# Patient Record
Sex: Female | Born: 1935 | Race: Black or African American | Hispanic: No | Smoking: Never smoker
Health system: Southern US, Community
[De-identification: ages and names within clinical notes are randomized; demographics above are authoritative.]

## PROBLEM LIST (undated history)

## (undated) DIAGNOSIS — I10 Essential (primary) hypertension: Secondary | ICD-10-CM

## (undated) DIAGNOSIS — C801 Malignant (primary) neoplasm, unspecified: Secondary | ICD-10-CM

## (undated) DIAGNOSIS — Q600 Renal agenesis, unilateral: Secondary | ICD-10-CM

## (undated) DIAGNOSIS — I639 Cerebral infarction, unspecified: Secondary | ICD-10-CM

## (undated) DIAGNOSIS — K219 Gastro-esophageal reflux disease without esophagitis: Secondary | ICD-10-CM

## (undated) DIAGNOSIS — K222 Esophageal obstruction: Secondary | ICD-10-CM

## (undated) DIAGNOSIS — R569 Unspecified convulsions: Secondary | ICD-10-CM

## (undated) DIAGNOSIS — E785 Hyperlipidemia, unspecified: Secondary | ICD-10-CM

## (undated) DIAGNOSIS — IMO0002 Reserved for concepts with insufficient information to code with codable children: Secondary | ICD-10-CM

## (undated) HISTORY — PX: KIDNEY SURGERY: SHX687

---

## 2001-05-05 ENCOUNTER — Encounter (INDEPENDENT_AMBULATORY_CARE_PROVIDER_SITE_OTHER): Payer: Self-pay | Admitting: Specialist

## 2001-05-05 ENCOUNTER — Ambulatory Visit (HOSPITAL_COMMUNITY): Admission: RE | Admit: 2001-05-05 | Discharge: 2001-05-05 | Payer: Self-pay | Admitting: *Deleted

## 2004-12-24 ENCOUNTER — Ambulatory Visit (HOSPITAL_COMMUNITY): Admission: RE | Admit: 2004-12-24 | Discharge: 2004-12-24 | Payer: Self-pay | Admitting: Urology

## 2005-01-06 ENCOUNTER — Encounter: Admission: RE | Admit: 2005-01-06 | Discharge: 2005-01-06 | Payer: Self-pay | Admitting: Urology

## 2005-01-12 ENCOUNTER — Encounter: Admission: RE | Admit: 2005-01-12 | Discharge: 2005-01-12 | Payer: Self-pay | Admitting: Urology

## 2005-02-23 ENCOUNTER — Encounter (INDEPENDENT_AMBULATORY_CARE_PROVIDER_SITE_OTHER): Payer: Self-pay | Admitting: *Deleted

## 2005-02-23 ENCOUNTER — Inpatient Hospital Stay (HOSPITAL_COMMUNITY): Admission: RE | Admit: 2005-02-23 | Discharge: 2005-03-13 | Payer: Self-pay | Admitting: Urology

## 2005-02-26 ENCOUNTER — Encounter (INDEPENDENT_AMBULATORY_CARE_PROVIDER_SITE_OTHER): Payer: Self-pay | Admitting: *Deleted

## 2005-03-03 ENCOUNTER — Ambulatory Visit: Payer: Self-pay | Admitting: Internal Medicine

## 2005-05-06 ENCOUNTER — Ambulatory Visit (HOSPITAL_COMMUNITY): Admission: RE | Admit: 2005-05-06 | Discharge: 2005-05-06 | Payer: Self-pay | Admitting: *Deleted

## 2005-05-19 ENCOUNTER — Ambulatory Visit (HOSPITAL_COMMUNITY): Admission: RE | Admit: 2005-05-19 | Discharge: 2005-05-19 | Payer: Self-pay | Admitting: Urology

## 2005-06-08 ENCOUNTER — Ambulatory Visit (HOSPITAL_COMMUNITY): Admission: RE | Admit: 2005-06-08 | Discharge: 2005-06-08 | Payer: Self-pay | Admitting: *Deleted

## 2008-07-22 ENCOUNTER — Emergency Department (HOSPITAL_COMMUNITY): Admission: EM | Admit: 2008-07-22 | Discharge: 2008-07-22 | Payer: Self-pay | Admitting: Family Medicine

## 2009-05-01 ENCOUNTER — Encounter (HOSPITAL_COMMUNITY): Admission: RE | Admit: 2009-05-01 | Discharge: 2009-05-02 | Payer: Self-pay | Admitting: Chiropractic Medicine

## 2009-05-24 ENCOUNTER — Ambulatory Visit (HOSPITAL_COMMUNITY): Admission: RE | Admit: 2009-05-24 | Discharge: 2009-05-24 | Payer: Self-pay | Admitting: Endocrinology

## 2010-12-26 NOTE — Discharge Summary (Signed)
NAME:  Shawna, Schneider            ACCOUNT NO.:  [id number]   MEDICAL RECORD NO.:  [id number]          PATIENT TYPE:  INP   LOCATION:  1432                         FACILITY:  [facility]   PHYSICIAN:  Bertram Millard. Dahlstedt, M.D.DATE OF BIRTH:  July 08, 1935   DATE OF ADMISSION:  02/23/2005  DATE OF DISCHARGE:  03/13/2005                                 DISCHARGE SUMMARY   DISCHARGE DIAGNOSES:  1.  Left renal mass.  2.  Chronic obstructive pulmonary disease.  3.  Acute renal failure.  4.  Systemic inflammatory response syndrome due to possible infection.  5.  Deep venous thrombosis with pulmonary embolus.   PROCEDURE:  Left hand-assisted laparoscopic nephrectomy.   SURGEON:  Marcine Matar, M.D.   CONSULTATIONS:  1.  Critical Care Medicine.  2.  Avel Peace, M.D. of General Surgery.   HISTORY OF PRESENT ILLNESS:  This is a 76 year old who was found to have a  left renal mass incidentally. After reviewing she elected to proceed with  left hand-assisted laparoscopic nephrectomy.   HOSPITAL COURSE:  The patient was taken to the operating room on February 23, 2005 and underwent the above-named procedure which she tolerated without  complications. She was taken to the floor in stable condition where she  remained till postoperative day #4. Her first four postoperative days were  uncomplicated; consisted of progressive toleration of ambulation, diet and  pain.   The patient was preparing for discharge on postoperative day #4 and began to  experience acute shortness of breath and respiratory distress. She had  persistent respiratory decompensation with transfer to the unit and was seen  by critical care medicine. Eventually she required intubation. She was  evaluated by General Surgery for possible bowel injury or pancreatitis,  however, no evidence of this could be found. The patient was found to have a  slight ileus. She underwent supportive care including broad spectrum  antibiotics  and Xigris as well as ventilatory support per the critical care  team. She was ultimately found to have a pulmonary embolus. She was also  found to have acute renal failure with her respiratory decompensation.  Fortunately with supportive care her overall status improved after several  days. She was able to be weaned from ventilatory and iatrogenic support. Her  serum creatinine normalized to its' postoperative level. She was started on  Lovenox bridge therapy prior to Coumadin administration. She was eventually  transferred back to the floor.   Because of her long ICU stay she was reasonably deconditioned. Physical and  occupational therapy consults were obtained. These services worked with the  patient throughout the remainder of her stay. Also throughout her ICU stay  the patient was maintained on TPN which was discontinued. Once the patient  got to the floor she at this time tolerated progressive toleration of diet  as well as continued toleration of pain with oral analgesics and progressive  toleration of ambulation and physical activity. On March 13, 2005 it was  determined the patient was in stable condition and could be discharged home.   PHYSICAL EXAMINATION AT TIME OF DISCHARGE:  ABDOMEN:  Soft, nondistended,  nontender to palpation without costovertebral angle tenderness. Incision is  well-healed. Central line site is clean, dry and intact without surrounding  erythema or exudate.   DISCHARGE INSTRUCTIONS:  The patient instructed not to lift more than 10  pounds for the next 6 weeks or to drive automobile while on narcotic pain  medicines. She is given written care instructions for her to call or return  if she begins to experience any redness, drainage from her wound or fevers  or chills. She is instructed to follow up with Dr. Retta Diones in  approximately one week for wound check. She is instructed to follow up with  pharmacy on March 23, 2005 for a check of her PT/INR. She  is also set up  with home physical therapy for continued evaluation and home physical  therapy treatment. She understands and agrees with these instructions.   DISCHARGE MEDICATIONS:  1.  Darvocet.  2.  Coumadin 5 mg daily.  3.  Clonidine 0.2 mg 1/2 tab b.i.d.  4.  Avelox 400 mg daily.  5.  Lovenox 70 mg b.i.d.  6.  Protonix 40 mg daily.     ______________________________  Glade Nurse, MD      Bertram Millard. Dahlstedt, M.D.  Electronically Signed    MT/MEDQ  D:  04/20/2005  T:  04/21/2005  Job:  [id number]

## 2010-12-26 NOTE — Procedures (Signed)
[facility]  Patient:    Shawna Schneider, Shawna Schneider Visit Number: [id number] MRN: [id number]          Service Type: END Location: ENDO Attending Physician:  Sabino Gasser Dictated by:   Sabino Gasser, M.D. Proc. Date: 05/05/01 Admit Date:  05/05/2001                             Procedure Report  PROCEDURE:  Upper endoscopy.  SURGEON:  Sabino Gasser, M.D.  INDICATIONS:  GERD.  ANESTHESIA:  Demerol 70 and Versed 5 mg.  DESCRIPTION OF PROCEDURE:  With the patient mildly sedated in the left lateral decubitus position, the Olympus videoscopic endoscope was inserted in the mouth, and passed under direct vision through the esophagus which showed no evidence of Barretts esophagus, but we entered into the stomach, fundus, body, and antrum.  The duodenal bulb was seen as was the second portion of the duodenum.  Photographs were taken.  From this point, the endoscope was slowly withdrawn, taking circumferential views of the entire duodenal mucosa until the endoscope had been pulled back, at which time, it was placed in retroflexion to view the stomach from below.  The endoscope was then straightened and withdrawn, taking circumferential views of the remaining gastric and esophageal mucosa, photographing along the way, and taking biopsies of what appeared to be edematous and erythematous antral mucosa, and a thickened fold that was in fact photographed and biopsied in the fundus.  The patients vital signs and pulse oximeter remained stable.  The patient tolerated the procedure well and without apparent complications.  FINDINGS:  What appeared to be ulcerations, some old with scarring and possibly some new of the duodenum, and associated with erythema of the stomach, and one thickened fold area.  Biopsies taken.  Await biopsy report. Presumably, this patient may have Helicobacter pylori with duodenal ulcer. Will need the patient on a proton pump inhibitor for this and the  reflux symptomatology.  Possibly, may need to treat Helicobacter pylori if positive on biopsy.  The patient will call me for results of biopsy.  Followup with me as an outpatient.  Proceed to colonoscopy as planned. Dictated by:   Sabino Gasser, M.D. Attending Physician:  Sabino Gasser DD:  05/05/01 TD:  05/05/01 Job: [id number]

## 2010-12-26 NOTE — Consult Note (Signed)
NAME:  Shawna Schneider, MANTER            ACCOUNT NO.:  [id number]   MEDICAL RECORD NO.:  [id number]          PATIENT TYPE:  INP   LOCATION:  1408                         FACILITY:  [facility]   PHYSICIAN:  Lonia Blood, M.D.      DATE OF BIRTH:  [birth year]   DATE OF CONSULTATION:  02/25/2005  DATE OF DISCHARGE:                                   CONSULTATION   PRIMARY CARE PHYSICIAN:  Dr. Merri Brunette   REFERRING PHYSICIAN:  Dr. Retta Diones, urology.   REASON FOR CONSULT:  Shortness of breath.   ASSESSMENT:  1.  Shortness of breath, postoperatively.  Differentials include basal      atelectasis secondary to poor inspiration, pneumonia, chronic      obstructive pulmonary disease as patient is wheezing, pulmonary embolus,      acute coronary syndrome.  Most likely, chronic obstructive pulmonary      disease would be at the top followed by fluid overload, as patient's      intake and output review shows that she has been positive at least 7 L      in the past 2 days.  2.  Status post nephrectomy for presumed renal cell carcinoma.  3.  Hypertension.  4.  Obesity.  5.  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  6.  History of duodenal ulcerations in September 2002.  7.  Acute renal insufficiency with creatinine 1.9.  8.  Postoperative normocytic anemia.  9.  Leukocytosis with left shift.   RECOMMENDATIONS:  Proceed with chest x-ray, PA and lateral, check BNP, start  scheduled nebulizers.  If BNP is elevated or chest x-ray shows pulmonary  edema, proceed with 2 D echocardiogram.  If chest x-ray is fine, we will  check a V/Q scan.  Also consider holding IV fluids if chest x-ray shows  fluid overload.  The patient will definitely need some form of DVT  prophylaxis.  If it is okay with surgery, we can start Lovenox or better  still, heparin.   HISTORY AND PHYSICAL:  The patient is a 76 year old African-American female,  admitted on February 23, 2005, for nephrectomy  secondary to left renal mass  discovered in May of this year.  The patient also had some adrenal nodules  described as 1.5 and 0.6 cm in size.  The mass that was excised was 3 x 5 x  3 cm.  She has had nephrectomy 2 days ago and has been stable  postoperatively until today when she started having worsening shortness of  breath, associated with some diffuse chest discomfort.  The patient has  smoked about 1/2 pack-per-day for more than 30 years.  She has no prior  history of COPD or any other respiratory disease.  She denied any leg pain.   Her past medical history is significant only for hypertension.  The patient  also has a partial hysterectomy for cervical cancer 43 years ago.  She has a  history of a GI bleed in September 2003, where she was found to have  ulcerations, duodenal ulceration, as well as AVM.   ALLERGIES:  PENICILLIN.   MEDICATIONS PRIOR TO THIS ADMISSION:  Only Benicar 40 mg daily.   SOCIAL HISTORY:  The patient is divorced with 3 children.  She is a school  bus driver.  Currently smokes about 1/2 pack of cigarettes per day.  She has  done that for more than 30 years.  Denied any alcohol intake.   FAMILY HISTORY:  Significant for hypertension and diabetes.  No history of  cancers.   REVIEW OF SYSTEMS:  Mainly as in HPI except for the pain and the shortness  of breath, as indicated in HPI.   PHYSICAL EXAMINATION:  VITAL SIGNS:  Temperature 99.5, blood pressure  174/98.  Her pulse is 88, respiratory rate 24, saturations 98% on 2 L.  GENERAL:  The patient is alert, oriented, but seemed to be in acute  distress.  HEENT:  PERRLA, EOMI.  NECK:  Supple, no JVD, no lymphadenopathy.  RESPIRATORY:  The patient has diffuse expiratory wheezes bilaterally.  CARDIOVASCULAR SYSTEM:  She has regular rate and rhythm.  ABDOMEN:  Soft, nontender with positive bowel sounds.  EXTREMITIES:  No edema, cyanosis, or clubbing.   Labs obtained today showed sodium of 130, potassium 4.1,  chloride 99, CO2  34, glucose 132, BUN 21, creatinine 1.9, calcium 9.0.  Admission creatinine  of note was 1.3.  Chest x-ray currently just performed.  A review of the  chest shows possibly bilateral lower lobe infiltrates versus atelectasis.  EKG performed yesterday just showed normal sinus rhythm.   ASSESSMENT:  This is a 76 year old female, status post nephrectomy who has  been on PCA pump.  Presenting with acute shortness of breath.  The  differentials are as indicated above.  It sounds like patient may have had  chronic obstructive pulmonary disease secondary to her tobacco smoking that  is getting exacerbated.  She has also been on narcotics which may have  further worsened her ability to take deep breath.  She may have had some  atelectasis.  Pneumonia could not be effectively ruled out, although she is  afebrile.  She is currently not on any antibiotics, and we will not start  unless she spikes a fever.  A review of the chest x-ray shows definite  infiltrate.  Other possibility is that patient is having fluid overload.  Based on this, we will go with the recommendations as above.  We will follow  the chest x-ray as indicated and start patient on scheduled nebulizers.  We  will follow the patient with you and also treat her hypertension  effectively.      Lonia Blood, M.D.  Electronically Signed     LG/MEDQ  D:  02/25/2005  T:  02/26/2005  Job:  [id number]

## 2010-12-26 NOTE — Consult Note (Signed)
NAME:  Shawna Schneider, Shawna Schneider            ACCOUNT NO.:  [id number]   MEDICAL RECORD NO.:  [id number]          PATIENT TYPE:  INP   LOCATION:  0157                         FACILITY:  [facility]   PHYSICIAN:  Adolph Pollack, M.D.DATE OF BIRTH:  [birth year]   DATE OF CONSULTATION:  DATE OF DISCHARGE:                                   CONSULTATION   REASON:  Acute respiratory decompensation, questionable bowel injury.   HISTORY OF PRESENT ILLNESS:  Shawna Schneider is a 76 year old female who is  postop day #4 from a endoscopic hand-assisted left nephrectomy for a left  renal mass, which turned out to be renal cell carcinoma.  She was doing well  until postop days #2 and 3, when she began complaining of some shortness of  breath and had some respiratory distress.  She essentially had persistent  respiratory decompensation and being intubated and placed in the ICU.  She  had a rise in her creatinine to a significant level.  Amylase was slightly  elevated as well as her white count.  There was concern for potential  delayed bowel injury.  I subsequently was asked to see her.  She is  currently heavily sedated.   PAST MEDICAL HISTORY:  1.  Hypertension.  2.  Obesity.   PAST SURGICAL HISTORY:  Hysterectomy.   DRUG ALLERGIES:  PENICILLIN.   CURRENT MEDICATIONS:  Listed on her MAR, which is reviewed.   SOCIAL HISTORY:  According to the chart, there is no alcohol use.  She does  smoke cigarettes.   REVIEW OF SYSTEMS:  Unobtainable in her current state.   PHYSICAL EXAMINATION:  VITAL SIGNS:  She currently is afebrile.  Blood  pressure 134/75, pulse 106.  GENERAL:  An obese female who is on the ventilator.  She is heavily sedated.  RESPIRATORY:  Breath sounds are equal with bilateral wheezes present.  CARDIOVASCULAR:  Increased rate with a regular rhythm.  She has good pedal  pulses.  ABDOMEN:  Moderately firm and distended but incisions are clean and intact  with staples present.  Hypoactive  bowel sounds are noted.  I cannot elicit  any tenderness.   LABORATORY DATA:  White blood cell count 8600, hemoglobin 11.  Lipase is  normal at 14.  Amylase slightly elevated at 175.  Liver function tests are  not elevated.  Creatinine is 2.9.   Abdominal CT was performed and reviewed with radiologist.  This demonstrates  bilateral pleural effusions.  There is no significant intraperitoneal fluid  or free air.  Dilated small bowel loops in areas as well as gas-filled and  transverse colon.  No postoperative changes.  No evidence of bowel  thickening or ischemic-type bowel.   IMPRESSION:  1.  Acute respiratory decompensation.  2.  Ileus.  I do not see any strong evidence of a small bowel injury at this      time.  There is a potential for her having some mild pancreatitis      postoperatively in the tail of the pancreas region.   PLAN:  I will continue nasogastric tube suction, as this is evacuating a  fair  amount of fluid from her stomach.  Recheck amylase and lipase in the  morning.  Will follow along with you.       TJR/MEDQ  D:  02/27/2005  T:  02/27/2005  Job:  [id number]   cc:   Bertram Millard. Dahlstedt, M.D.  [address]  [place]  [place] [address]  Fax: [phone]

## 2010-12-26 NOTE — H&P (Signed)
NAME:  Shawna Schneider, Shawna Schneider            ACCOUNT NO.:  [id number]   MEDICAL RECORD NO.:  [id number]          PATIENT TYPE:  INP   LOCATION:  0006                         [facility]ACILITY:  [facility]   PHYSICIAN:  Bertram Millard. Dahlstedt, M.D.DATE OF BIRTH:  [birth year]   DATE OF ADMISSION:  02/23/2005  DATE OF DISCHARGE:                                HISTORY & PHYSICAL   REASON FOR ADMISSION:  Left renal mass.   HISTORY OF PRESENT ILLNESS:  This is a nice 76 year old female who in May  was sent in by Dr. Renne Crigler for evaluation and management of a left renal mass.  She was found to have a mass on ultrasound for an abnormal pelvic exam.  The  remaining ultrasound was normal.  Confirmatory CT scan was performed showing  an enhancing 3 x 5 x 3 cm mass in the upper pole of the left kidney.  She  had small adrenal nodules, 1.5 and 0.6 cm in size.  Her metastatic survey  was negative, although she did have an elevated alkaline phosphatase which  precipitated a bone scan being performed.  That was normal.   She presents at this time for laparoscopic nephrectomy.  She understands the  risks and complications of this procedure.  They include but are not limited  to need for converting from laparoscopic to open procedure, injury to  surrounding organs, infection, bleeding, need for transfusion, DVT, PE,  among others.  She desires to proceed.   PAST MEDICAL HISTORY:  1.  Hypertension.  2.  Status post partial hysterectomy for cervical cancer 43 years ago.   MEDICATIONS:  Benicar 40 mg daily.   ALLERGIES:  Penicillin.   SOCIAL HISTORY:  The patient is divorced and has two children. She is a  school bus driver.  She smokes 1/2 pack of cigarettes a day.   FAMILY HISTORY:  Hypertension.   REVIEW OF SYSTEMS:  The patient denies cough, chest pain, significant pedal  edema, abdominal pain.  She denies any recent change in bowel or bladder  habits.   PHYSICAL EXAMINATION:  GENERAL:  A pleasant healthy-appearing  female.  VITAL SIGNS:  In the office, blood pressure was 133/90, pulse 78,  temperature 98.3.  NECK:  Supple.  No thyromegaly or adenopathy noted.  CHEST:  Clear breath sounds throughout both lung fields.  HEART:  Normal rate, rhythm.  No gallops, rubs, or murmurs.  ABDOMEN:  Protuberant, soft, nondistended, nontender.  There was no mass, no  organomegaly.  Her bladder was not palpable.  There is no flank mass or CVA  tenderness.  GENITOURINARY:  Normal external genitalia.  EXTREMITIES:  She had trace pretibial edema bilaterally.  Dorsalis pedis and  posterior pulses were normal bilaterally.  NEUROLOGIC:  Grossly intact.   LABORATORY DATA:  CBC and BMET were normal.  EKG normal.  Chest x-ray  normal.   IMPRESSION:  1.  Left renal mass, probable renal cell carcinoma.  2.  Hypertension.   PLAN:  Will admit for laparoscopic nephrectomy.       SMD/MEDQ  D:  02/23/2005  T:  02/23/2005  Job:  [id number]  cc:   Soyla Murphy. Renne Crigler, M.D.  [address]  [place]  [place] [address]  Fax: [phone]

## 2010-12-26 NOTE — Op Note (Signed)
NAME:  Shawna Schneider, Shawna Schneider            ACCOUNT NO.:  [id number]   MEDICAL RECORD NO.:  [id number]          PATIENT TYPE:  INP   LOCATION:  1408                         FACILITY:  [facility]   PHYSICIAN:  Bertram Millard. Dahlstedt, M.D.DATE OF BIRTH:  12/28/1934   DATE OF PROCEDURE:  02/23/2005  DATE OF DISCHARGE:                                 OPERATIVE REPORT   PREOPERATIVE DIAGNOSIS:  Left renal mass.   POSTOPERATIVE DIAGNOSIS:  Left renal mass.   PROCEDURE:  Laparoscopic hand assisted left radical nephrectomy.   SURGEON:  Bertram Millard. Dahlstedt, M.D.   FIRST ASSISTANT:  Glade Nurse, M.D.   ANESTHESIA:  General endotracheal.   COMPLICATIONS:  None.   ESTIMATED BLOOD LOSS:  150 mL.   BRIEF HISTORY:  This nice middle-aged female recently presented to me with a  left renal mass. The patient saw Dr. Renne Crigler for routine evaluation and had an  abdominal ultrasound to follow-up right adnexal fullness. The pelvic  ultrasound was normal, but the ultrasound of the abdomen showed a solid left  upper pole mass approximately 4-5 cm in size. Follow-up CT confirmed an  enhancing left upper pole mass. There were two small nodules on the left  adrenal as well. The bone scan was negative. Chest x-ray was negative.   The patient was counseled on treatment of this 4 cm likely renal cell  carcinoma. It was recommended that she have it resected. The risks and  complications of left nephrectomy were discussed with the patient. These  include but are not limited to need to convert from laparoscopic to open  procedure, bowel injury, injury to surrounding organs, bleeding, infection,  need for transfusion, lung infection, DVT, PE, among others. She understands  these and desires to proceed.   DESCRIPTION OF PROCEDURE:  The patient was administered general endotracheal  anesthetic after preoperative IV antibiotics were administered. She was  placed in the left flank up position with a beanbag. Her anterior  abdomen  was prepped and draped after a Foley catheter was placed transurethrally. A  6.5 cm periumbilical incision was made in the midline and carried down to  peritoneum with electrocautery. Two other ports, 12 mm in size, were  established in the upper midline in the left lower quadrant. A gel port was  then placed. The abdomen was appropriately insufflated. The cautery hook was  then introduced and the entire left colon was taken down from the mid pelvis  inferiorly to, and through the splenic flexure. In this manner, the colon  was reflected medially. Splenic attachments were additionally taken down  laterally. This allowed for the colon to be reflected more easily. The colon  was then dissected bluntly off of the anterior Gerota's fascia. In this  manner, we were able to identify the ureter inferior and medial to the lower  pole of the kidney. This was doubly clipped and divided. The entire lower  pole was then freed up and, using the LigaSure, this dissection was  accomplished. Dissection was then carried up medially along the kidney in a  superior direction. In this manner, we easily encountered what appeared to  be a single renal vein. The artery was palpable easily superior to this.  Careful dissection was carried superior to the vein such that the tortuous  artery was identified, circumscribed, doubly clipped medially and singly  clipped laterally. It was then divided. The renal vein was then clipped with  the same Hem-o-Loks  three times medially and two times laterally. Both  vessels were then divided. Dissection was then carried up superiorly along  to the adrenal. The adrenal was initially left in and we dissected in  between the adrenal and the upper pole of the kidney. Posterior lateral and  superior attachments were easily taken care of with either blunt dissection  or the LigaSure. The kidney was then removed. At this point, hemostasis was  excellent. The adrenal was  identified. Since there were two small nodules  within the adrenal on the CT scan, it was elected to remove the adrenal  gland. The drain was carefully dissected and small vessels were carefully  controlled using the LigaSure. After the dissection, the entire adrenal was  excised and removed, and sent with the initial left renal specimen.  Hemostasis to the adrenal bed appeared to be adequate. A Surgicel sponge was  placed in this area and left for hemostasis. The remaining hilar vessels  were identified and were hemostatic. No bleeding was seen. The colon was  then allowed to fall onto the renal bed. The pneumoperitoneum was evacuated.  The trocar sites were identified after the trocars were removed and they  were hemostatic. Subcutaneous sutures of 0 Vicryl were placed in the trocar  sites and the trocar sites closed with skin staples. The abdominal incision  was closed using a running #1 PDS - that was in the fascia. Skin edges were  reapproximated using staples. Dry sterile dressings were placed.   Sponge, needle, instrument counts were correct x2.   The patient was awakened, extubated, and taken to the PACU in stable  condition.       SMD/MEDQ  D:  02/23/2005  T:  02/24/2005  Job:  [id number]   cc:   Soyla Murphy. Renne Crigler, M.D.  [address]  [place]  [place] [address]  Fax: [phone]

## 2010-12-26 NOTE — Procedures (Signed)
[facility]  Patient:    Shawna Schneider, Shawna Schneider Visit Number: [id number] MRN: [id number]          Service Type: END Location: ENDO Attending Physician:  Sabino Gasser Proc. Date: 05/05/01 Admit Date:  05/05/2001                             Procedure Report  PROCEDURE:  Colonoscopy.  INDICATIONS:  Rectal bleeding.  ANESTHESIA:  Demerol 30, Versed 4 mg.  DESCRIPTION OF PROCEDURE:  With the patient mildly sedated in the left lateral decubitus position, the Olympus videoscopic colonoscope was inserted in the rectum and passed under direct vision to the cecum, identified by the ileocecal valve and appendiceal orifice, both of which were photographed.  In the cecum were two AVMs which were photographed.  From this point, the colonoscope was slowly withdrawn taking circumferential views of the entire colonic mucosa, stopping in the sigmoid area where multiple diverticula were seen and photographed along with a polyp at approximately 25 cm from the anal verge which was photographed and removed using hot biopsy forceps technique setting of 20-20 blended current.  The endoscope was withdrawn to the rectum which appeared normal on direct view and showed internal hemorrhoids on retroflexed view and also pull-through of the anal canal.  The patients vital signs and pulse oximeter remained stable.  The patient tolerated the procedure well without apparent complications.  FINDINGS:  Arteriovenous malformations of the cecum.  Diverticulosis of entire colon much more significant and fairly severe in the left colon.  Small polyp at 25 cm, biopsied and internal hemorrhoids.  PLAN:  Await biopsy report.  The patient will call me for results and follow up with me as an outpatient.  Instruction concerning diverticulosis. Attending Physician:  Sabino Gasser DD:  05/05/01 TD:  05/05/01 Job: [id number]

## 2011-04-09 ENCOUNTER — Other Ambulatory Visit: Payer: Self-pay | Admitting: Internal Medicine

## 2011-04-09 ENCOUNTER — Other Ambulatory Visit (HOSPITAL_COMMUNITY)
Admission: RE | Admit: 2011-04-09 | Discharge: 2011-04-09 | Disposition: A | Discharge status: Home / Self Care | Payer: BC Managed Care – PPO | Source: Ambulatory Visit | Attending: Internal Medicine | Admitting: Internal Medicine

## 2011-04-09 DIAGNOSIS — Z01419 Encounter for gynecological examination (general) (routine) without abnormal findings: Secondary | ICD-10-CM | POA: Insufficient documentation

## 2011-04-09 DIAGNOSIS — Z1159 Encounter for screening for other viral diseases: Secondary | ICD-10-CM | POA: Insufficient documentation

## 2011-12-09 ENCOUNTER — Other Ambulatory Visit: Payer: Self-pay | Admitting: Internal Medicine

## 2011-12-14 ENCOUNTER — Ambulatory Visit
Admission: RE | Admit: 2011-12-14 | Discharge: 2011-12-14 | Disposition: A | Discharge status: Home / Self Care | Payer: BC Managed Care – PPO | Source: Ambulatory Visit | Attending: Internal Medicine | Admitting: Internal Medicine

## 2012-05-06 ENCOUNTER — Emergency Department (INDEPENDENT_AMBULATORY_CARE_PROVIDER_SITE_OTHER): Payer: BC Managed Care – PPO

## 2012-05-06 ENCOUNTER — Emergency Department (HOSPITAL_COMMUNITY)
Admission: EM | Admit: 2012-05-06 | Discharge: 2012-05-06 | Disposition: A | Discharge status: Home / Self Care | Payer: BC Managed Care – PPO | Source: Home / Self Care

## 2012-05-06 ENCOUNTER — Encounter (HOSPITAL_COMMUNITY): Payer: Self-pay | Admitting: Emergency Medicine

## 2012-05-06 DIAGNOSIS — M7750 Other enthesopathy of unspecified foot: Secondary | ICD-10-CM

## 2012-05-06 DIAGNOSIS — M7741 Metatarsalgia, right foot: Secondary | ICD-10-CM

## 2012-05-06 HISTORY — DX: Hyperlipidemia, unspecified: E78.5

## 2012-05-06 LAB — URIC ACID: Uric Acid, Serum: 7.9 mg/dL — ABNORMAL HIGH (ref 2.4–7.0)

## 2012-05-06 MED ORDER — INDOMETHACIN 25 MG PO CAPS: 25.0000 mg | ORAL_CAPSULE | Freq: Three times a day (TID) | ORAL | Status: DC | PRN

## 2012-05-06 NOTE — ED Provider Notes (Signed)
History     CSN: [id number]  Arrival date & time 05/06/12  1411   None     Chief Complaint  Patient presents with  . Foot Pain    (Consider location/radiation/quality/duration/timing/severity/associated sxs/prior treatment) HPI Comments: 77 year old female with chief complaint "my right foot hurts" she does not know how it happened she woke yesterday couldn't place weight on her foot. She denies any trauma or known injury. She denies history of gout. Pain is located along the dorsal aspect of the foot along the first second metatarsals. There is no ankle pain or swelling.  Patient is a 77 y.o. female presenting with lower extremity pain. The history is provided by the patient.  Foot Pain The current episode started yesterday. The problem occurs constantly. The problem has not changed since onset.Pertinent negatives include no chest pain, no abdominal pain, no headaches and no shortness of breath. The symptoms are aggravated by standing and walking. The symptoms are relieved by rest.    Past Medical History  Diagnosis Date  . Hyperlipemia     Past Surgical History  Procedure Date  . Kidney surgery     No family history on file.  History  Substance Use Topics  . Smoking status: Never Smoker   . Smokeless tobacco: Not on file  . Alcohol Use: No    OB History    Grav Para Term Preterm Abortions TAB SAB Ect Mult Living                  Review of Systems  Constitutional: Negative for fever, chills and activity change.  HENT: Negative.   Respiratory: Negative.  Negative for shortness of breath.   Cardiovascular: Negative.  Negative for chest pain.  Gastrointestinal: Negative for abdominal pain.  Musculoskeletal: Positive for gait problem.       As per HPI  Skin: Negative for color change, pallor and rash.  Neurological: Negative.  Negative for numbness and headaches.    Allergies  Penicillins  Home Medications   Current Outpatient Rx  Name Route Sig Dispense  Refill  . CRESTOR PO Oral Take by mouth.    Marland Kitchen MICARDIS PO Oral Take by mouth.    . TIMOLOL HEMIHYDRATE OP Ophthalmic Apply to eye.    . INDOMETHACIN 25 MG PO CAPS Oral Take 1 capsule (25 mg total) by mouth 3 (three) times daily as needed (Take with food). 15 capsule 0    BP 150/91  Pulse 74  Temp 98 F (36.7 C) (Oral)  Resp 20  SpO2 98%  Physical Exam  Constitutional: She is oriented to person, place, and time. She appears well-developed and well-nourished. No distress.  HENT:  Head: Normocephalic and atraumatic.  Eyes: EOM are normal. Pupils are equal, round, and reactive to light.  Neck: Normal range of motion. Neck supple.  Pulmonary/Chest: Effort normal.  Musculoskeletal: Normal range of motion. She exhibits tenderness.       Tenderness over the first and second proximal and mid metatarsals. No discoloration, positive for mild local swelling. Distal neurovascular motor sensory intact. Pedal pulses 2+  Neurological: She is alert and oriented to person, place, and time. No cranial nerve deficit.  Skin: Skin is warm and dry. No rash noted. No erythema.    ED Course  Procedures (including critical care time)   Labs Reviewed  URIC ACID   Dg Foot Complete Right  05/06/2012  *RADIOLOGY REPORT*  Clinical Data: Right foot pain  RIGHT FOOT COMPLETE - 3+ VIEW  Comparison:  Right ankle films from 07/22/2008  Findings: No evidence for fracture.  No subluxation or dislocation. Degenerative changes are seen in the MTP joint of the great toe.  IMPRESSION: No acute bony findings.   Original Report Authenticated By: ERIC A. MANSELL, M.D.      1. Metatarsalgia of right foot       MDM  Ace wrap. Cold packs off and on. 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 Going a uric acid level to rule out gout., However, at the last moment she did offer new piece of information regarding her  having to drive the bus and press on the brake accelerator off and Mrs. was more likely a metatarsalgia denies musculoskeletal pain.  Dg Foot Complete Right  05/06/2012  *RADIOLOGY REPORT*  Clinical Data: Right foot pain  RIGHT FOOT COMPLETE - 3+ VIEW  Comparison: Right ankle films from 07/22/2008  Findings: No evidence for fracture.  No subluxation or dislocation. Degenerative changes are seen in the MTP joint of the great toe.  IMPRESSION: No acute bony findings.   Original Report Authenticated By: ERIC A. MANSELL, M.D.          Hayden Rasmussen, NP 05/06/12 1635  Hayden Rasmussen, NP 05/06/12 1701

## 2012-05-06 NOTE — ED Provider Notes (Signed)
Medical screening examination/treatment/procedure(s) were performed by non-physician practitioner and as supervising physician I was immediately available for consultation/collaboration.  Leslee Home, M.D.   Reuben Likes, MD 05/06/12 2251

## 2012-05-06 NOTE — ED Notes (Signed)
Pt c/o right foot pain x1 day... Sx include: tender, swelling w/limited movement... She denies: inj/trauma to foot, fever, nausea, vomiting, diarrhea

## 2012-05-12 ENCOUNTER — Telehealth (HOSPITAL_COMMUNITY): Payer: Self-pay | Admitting: *Deleted

## 2012-05-12 NOTE — ED Notes (Signed)
Pt. called back.  Pt. verified x 2 and given results.  Pt. given NP's instructions and voiced understanding. Shawna Schneider 05/12/2012

## 2012-05-12 NOTE — ED Notes (Signed)
10/2  Uric Acid 7.9 H.  Message sent to Hayden Rasmussen NP.  Mignon Pine Showed lab to [facility]. He said to notify pt. of result and ask her to f/u with her PCP for possible long term treatment to lower her uric acid. If she does not not have a PCP she should obtain one.  I called pt. and left a message to call. Vassie Moselle 05/12/2012

## 2012-08-15 ENCOUNTER — Other Ambulatory Visit (HOSPITAL_COMMUNITY): Payer: Self-pay | Admitting: *Deleted

## 2017-05-10 DIAGNOSIS — I639 Cerebral infarction, unspecified: Secondary | ICD-10-CM

## 2017-05-10 HISTORY — DX: Cerebral infarction, unspecified: I63.9

## 2017-05-24 ENCOUNTER — Emergency Department (HOSPITAL_COMMUNITY): Payer: BC Managed Care – PPO

## 2017-05-24 ENCOUNTER — Encounter (HOSPITAL_COMMUNITY): Payer: Self-pay | Admitting: Emergency Medicine

## 2017-05-24 ENCOUNTER — Inpatient Hospital Stay (HOSPITAL_COMMUNITY)
Admission: EM | Admit: 2017-05-24 | Discharge: 2017-06-02 | DRG: 062 | Disposition: A | Discharge status: Home / Self Care | Payer: BC Managed Care – PPO | Attending: Neurology | Admitting: Neurology

## 2017-05-24 DIAGNOSIS — D6832 Hemorrhagic disorder due to extrinsic circulating anticoagulants: Secondary | ICD-10-CM | POA: Diagnosis not present

## 2017-05-24 DIAGNOSIS — C786 Secondary malignant neoplasm of retroperitoneum and peritoneum: Secondary | ICD-10-CM | POA: Diagnosis present

## 2017-05-24 DIAGNOSIS — I639 Cerebral infarction, unspecified: Secondary | ICD-10-CM | POA: Diagnosis present

## 2017-05-24 DIAGNOSIS — D630 Anemia in neoplastic disease: Secondary | ICD-10-CM | POA: Diagnosis present

## 2017-05-24 DIAGNOSIS — R4701 Aphasia: Secondary | ICD-10-CM | POA: Diagnosis present

## 2017-05-24 DIAGNOSIS — C7800 Secondary malignant neoplasm of unspecified lung: Secondary | ICD-10-CM | POA: Diagnosis present

## 2017-05-24 DIAGNOSIS — K5790 Diverticulosis of intestine, part unspecified, without perforation or abscess without bleeding: Secondary | ICD-10-CM | POA: Diagnosis present

## 2017-05-24 DIAGNOSIS — I824Z3 Acute embolism and thrombosis of unspecified deep veins of distal lower extremity, bilateral: Secondary | ICD-10-CM | POA: Diagnosis not present

## 2017-05-24 DIAGNOSIS — E785 Hyperlipidemia, unspecified: Secondary | ICD-10-CM | POA: Diagnosis present

## 2017-05-24 DIAGNOSIS — I959 Hypotension, unspecified: Secondary | ICD-10-CM | POA: Diagnosis not present

## 2017-05-24 DIAGNOSIS — I82432 Acute embolism and thrombosis of left popliteal vein: Secondary | ICD-10-CM | POA: Diagnosis not present

## 2017-05-24 DIAGNOSIS — T45515A Adverse effect of anticoagulants, initial encounter: Secondary | ICD-10-CM | POA: Diagnosis not present

## 2017-05-24 DIAGNOSIS — Z7952 Long term (current) use of systemic steroids: Secondary | ICD-10-CM

## 2017-05-24 DIAGNOSIS — N179 Acute kidney failure, unspecified: Secondary | ICD-10-CM | POA: Diagnosis present

## 2017-05-24 DIAGNOSIS — Z6832 Body mass index (BMI) 32.0-32.9, adult: Secondary | ICD-10-CM

## 2017-05-24 DIAGNOSIS — C799 Secondary malignant neoplasm of unspecified site: Secondary | ICD-10-CM | POA: Diagnosis not present

## 2017-05-24 DIAGNOSIS — D124 Benign neoplasm of descending colon: Secondary | ICD-10-CM | POA: Diagnosis present

## 2017-05-24 DIAGNOSIS — I129 Hypertensive chronic kidney disease with stage 1 through stage 4 chronic kidney disease, or unspecified chronic kidney disease: Secondary | ICD-10-CM | POA: Diagnosis present

## 2017-05-24 DIAGNOSIS — Z85528 Personal history of other malignant neoplasm of kidney: Secondary | ICD-10-CM

## 2017-05-24 DIAGNOSIS — Z79890 Hormone replacement therapy (postmenopausal): Secondary | ICD-10-CM

## 2017-05-24 DIAGNOSIS — I82441 Acute embolism and thrombosis of right tibial vein: Secondary | ICD-10-CM | POA: Diagnosis not present

## 2017-05-24 DIAGNOSIS — C801 Malignant (primary) neoplasm, unspecified: Secondary | ICD-10-CM | POA: Diagnosis not present

## 2017-05-24 DIAGNOSIS — I82443 Acute embolism and thrombosis of tibial vein, bilateral: Secondary | ICD-10-CM | POA: Diagnosis present

## 2017-05-24 DIAGNOSIS — Z8541 Personal history of malignant neoplasm of cervix uteri: Secondary | ICD-10-CM

## 2017-05-24 DIAGNOSIS — Z880 Allergy status to penicillin: Secondary | ICD-10-CM

## 2017-05-24 DIAGNOSIS — I10 Essential (primary) hypertension: Secondary | ICD-10-CM | POA: Diagnosis not present

## 2017-05-24 DIAGNOSIS — K922 Gastrointestinal hemorrhage, unspecified: Secondary | ICD-10-CM | POA: Diagnosis not present

## 2017-05-24 DIAGNOSIS — M79609 Pain in unspecified limb: Secondary | ICD-10-CM | POA: Diagnosis not present

## 2017-05-24 DIAGNOSIS — N289 Disorder of kidney and ureter, unspecified: Secondary | ICD-10-CM | POA: Diagnosis not present

## 2017-05-24 DIAGNOSIS — C189 Malignant neoplasm of colon, unspecified: Secondary | ICD-10-CM | POA: Diagnosis not present

## 2017-05-24 DIAGNOSIS — R471 Dysarthria and anarthria: Secondary | ICD-10-CM | POA: Diagnosis present

## 2017-05-24 DIAGNOSIS — R1900 Intra-abdominal and pelvic swelling, mass and lump, unspecified site: Secondary | ICD-10-CM

## 2017-05-24 DIAGNOSIS — K222 Esophageal obstruction: Secondary | ICD-10-CM | POA: Diagnosis present

## 2017-05-24 DIAGNOSIS — I82431 Acute embolism and thrombosis of right popliteal vein: Secondary | ICD-10-CM | POA: Diagnosis present

## 2017-05-24 DIAGNOSIS — R188 Other ascites: Secondary | ICD-10-CM | POA: Diagnosis present

## 2017-05-24 DIAGNOSIS — Z86711 Personal history of pulmonary embolism: Secondary | ICD-10-CM

## 2017-05-24 DIAGNOSIS — D649 Anemia, unspecified: Secondary | ICD-10-CM | POA: Diagnosis not present

## 2017-05-24 DIAGNOSIS — Z79899 Other long term (current) drug therapy: Secondary | ICD-10-CM

## 2017-05-24 DIAGNOSIS — Z905 Acquired absence of kidney: Secondary | ICD-10-CM

## 2017-05-24 DIAGNOSIS — K639 Disease of intestine, unspecified: Secondary | ICD-10-CM | POA: Diagnosis not present

## 2017-05-24 DIAGNOSIS — Z7901 Long term (current) use of anticoagulants: Secondary | ICD-10-CM | POA: Diagnosis not present

## 2017-05-24 DIAGNOSIS — C800 Disseminated malignant neoplasm, unspecified: Secondary | ICD-10-CM | POA: Diagnosis not present

## 2017-05-24 DIAGNOSIS — R599 Enlarged lymph nodes, unspecified: Secondary | ICD-10-CM | POA: Diagnosis not present

## 2017-05-24 DIAGNOSIS — R29702 NIHSS score 2: Secondary | ICD-10-CM | POA: Diagnosis present

## 2017-05-24 DIAGNOSIS — C186 Malignant neoplasm of descending colon: Secondary | ICD-10-CM | POA: Diagnosis not present

## 2017-05-24 DIAGNOSIS — E669 Obesity, unspecified: Secondary | ICD-10-CM | POA: Diagnosis present

## 2017-05-24 DIAGNOSIS — K449 Diaphragmatic hernia without obstruction or gangrene: Secondary | ICD-10-CM | POA: Diagnosis present

## 2017-05-24 DIAGNOSIS — I63412 Cerebral infarction due to embolism of left middle cerebral artery: Principal | ICD-10-CM | POA: Diagnosis present

## 2017-05-24 DIAGNOSIS — I82442 Acute embolism and thrombosis of left tibial vein: Secondary | ICD-10-CM | POA: Diagnosis not present

## 2017-05-24 DIAGNOSIS — Z8543 Personal history of malignant neoplasm of ovary: Secondary | ICD-10-CM | POA: Diagnosis not present

## 2017-05-24 DIAGNOSIS — N183 Chronic kidney disease, stage 3 (moderate): Secondary | ICD-10-CM | POA: Diagnosis present

## 2017-05-24 DIAGNOSIS — D123 Benign neoplasm of transverse colon: Secondary | ICD-10-CM | POA: Diagnosis not present

## 2017-05-24 DIAGNOSIS — R710 Precipitous drop in hematocrit: Secondary | ICD-10-CM | POA: Diagnosis not present

## 2017-05-24 DIAGNOSIS — E1122 Type 2 diabetes mellitus with diabetic chronic kidney disease: Secondary | ICD-10-CM | POA: Diagnosis present

## 2017-05-24 DIAGNOSIS — K921 Melena: Secondary | ICD-10-CM | POA: Diagnosis not present

## 2017-05-24 DIAGNOSIS — K625 Hemorrhage of anus and rectum: Secondary | ICD-10-CM | POA: Diagnosis not present

## 2017-05-24 DIAGNOSIS — Z86718 Personal history of other venous thrombosis and embolism: Secondary | ICD-10-CM

## 2017-05-24 DIAGNOSIS — Z87891 Personal history of nicotine dependence: Secondary | ICD-10-CM

## 2017-05-24 DIAGNOSIS — I6389 Other cerebral infarction: Secondary | ICD-10-CM | POA: Diagnosis not present

## 2017-05-24 DIAGNOSIS — I5030 Unspecified diastolic (congestive) heart failure: Secondary | ICD-10-CM | POA: Diagnosis not present

## 2017-05-24 DIAGNOSIS — I6300 Cerebral infarction due to thrombosis of unspecified precerebral artery: Secondary | ICD-10-CM | POA: Diagnosis not present

## 2017-05-24 DIAGNOSIS — K6389 Other specified diseases of intestine: Secondary | ICD-10-CM | POA: Diagnosis not present

## 2017-05-24 DIAGNOSIS — I6340 Cerebral infarction due to embolism of unspecified cerebral artery: Secondary | ICD-10-CM | POA: Diagnosis not present

## 2017-05-24 DIAGNOSIS — R911 Solitary pulmonary nodule: Secondary | ICD-10-CM | POA: Diagnosis not present

## 2017-05-24 HISTORY — DX: Renal agenesis, unilateral: Q60.0

## 2017-05-24 HISTORY — DX: Reserved for concepts with insufficient information to code with codable children: IMO0002

## 2017-05-24 LAB — DIFFERENTIAL
Basophils Absolute: 0 10*3/uL (ref 0.0–0.1)
Basophils Relative: 0 %
EOS PCT: 3 %
Eosinophils Absolute: 0.2 10*3/uL (ref 0.0–0.7)
LYMPHS PCT: 17 %
Lymphs Abs: 1.4 10*3/uL (ref 0.7–4.0)
MONO ABS: 0.8 10*3/uL (ref 0.1–1.0)
Monocytes Relative: 9 %
Neutro Abs: 5.8 10*3/uL (ref 1.7–7.7)
Neutrophils Relative %: 71 %

## 2017-05-24 LAB — COMPREHENSIVE METABOLIC PANEL
ALK PHOS: 105 U/L (ref 38–126)
ALT: 13 U/L — ABNORMAL LOW (ref 14–54)
ANION GAP: 10 (ref 5–15)
AST: 20 U/L (ref 15–41)
Albumin: 3.4 g/dL — ABNORMAL LOW (ref 3.5–5.0)
BUN: 37 mg/dL — ABNORMAL HIGH (ref 6–20)
CALCIUM: 10.5 mg/dL — AB (ref 8.9–10.3)
CO2: 16 mmol/L — AB (ref 22–32)
Chloride: 112 mmol/L — ABNORMAL HIGH (ref 101–111)
Creatinine, Ser: 3.22 mg/dL — ABNORMAL HIGH (ref 0.44–1.00)
GFR calc non Af Amer: 13 mL/min — ABNORMAL LOW (ref >60)
GFR, EST AFRICAN AMERICAN: 15 mL/min — AB (ref >60)
Glucose, Bld: 90 mg/dL (ref 65–99)
Potassium: 3.8 mmol/L (ref 3.5–5.1)
SODIUM: 138 mmol/L (ref 135–145)
TOTAL PROTEIN: 7 g/dL (ref 6.5–8.1)
Total Bilirubin: 0.4 mg/dL (ref 0.3–1.2)

## 2017-05-24 LAB — CBC
HCT: 28.9 % — ABNORMAL LOW (ref 36.0–46.0)
Hemoglobin: 9.1 g/dL — ABNORMAL LOW (ref 12.0–15.0)
MCH: 25.9 pg — AB (ref 26.0–34.0)
MCHC: 31.5 g/dL (ref 30.0–36.0)
MCV: 82.3 fL (ref 78.0–100.0)
PLATELETS: 246 10*3/uL (ref 150–400)
RBC: 3.51 MIL/uL — AB (ref 3.87–5.11)
RDW: 16 % — AB (ref 11.5–15.5)
WBC: 8.2 10*3/uL (ref 4.0–10.5)

## 2017-05-24 LAB — I-STAT CHEM 8, ED
BUN: 35 mg/dL — ABNORMAL HIGH (ref 6–20)
CALCIUM ION: 1.4 mmol/L (ref 1.15–1.40)
Chloride: 114 mmol/L — ABNORMAL HIGH (ref 101–111)
Creatinine, Ser: 3.2 mg/dL — ABNORMAL HIGH (ref 0.44–1.00)
GLUCOSE: 85 mg/dL (ref 65–99)
HCT: 28 % — ABNORMAL LOW (ref 36.0–46.0)
HEMOGLOBIN: 9.5 g/dL — AB (ref 12.0–15.0)
POTASSIUM: 3.9 mmol/L (ref 3.5–5.1)
Sodium: 140 mmol/L (ref 135–145)
TCO2: 18 mmol/L — AB (ref 22–32)

## 2017-05-24 LAB — CBG MONITORING, ED: GLUCOSE-CAPILLARY: 76 mg/dL (ref 65–99)

## 2017-05-24 LAB — PROTIME-INR
INR: 1.11
PROTHROMBIN TIME: 14.2 s (ref 11.4–15.2)

## 2017-05-24 LAB — APTT: aPTT: 33 seconds (ref 24–36)

## 2017-05-24 LAB — I-STAT TROPONIN, ED: Troponin i, poc: 0.05 ng/mL (ref 0.00–0.08)

## 2017-05-24 LAB — MRSA PCR SCREENING: MRSA BY PCR: NEGATIVE

## 2017-05-24 MED ORDER — ACETAMINOPHEN 325 MG PO TABS
650.0000 mg | ORAL_TABLET | ORAL | Status: DC | PRN
  Administered 2017-05-25: 650 mg via ORAL
  Filled 2017-05-24: qty 2

## 2017-05-24 MED ORDER — SODIUM CHLORIDE 0.9 % IV SOLN
INTRAVENOUS | Status: DC
  Administered 2017-05-24 – 2017-05-25 (×2): via INTRAVENOUS

## 2017-05-24 MED ORDER — ROSUVASTATIN CALCIUM 5 MG PO TABS
5.0000 mg | ORAL_TABLET | Freq: Every day | ORAL | Status: DC
  Administered 2017-05-25 – 2017-05-29 (×5): 5 mg via ORAL
  Filled 2017-05-24 (×5): qty 1

## 2017-05-24 MED ORDER — ACETAMINOPHEN 160 MG/5ML PO SOLN: 650.0000 mg | ORAL | Status: DC | PRN

## 2017-05-24 MED ORDER — SODIUM CHLORIDE 0.9 % IV BOLUS (SEPSIS)
1000.0000 mL | Freq: Once | INTRAVENOUS | Status: AC
  Administered 2017-05-25: 1000 mL via INTRAVENOUS

## 2017-05-24 MED ORDER — ACETAMINOPHEN 650 MG RE SUPP: 650.0000 mg | RECTAL | Status: DC | PRN

## 2017-05-24 MED ORDER — ALTEPLASE (STROKE) FULL DOSE INFUSION
0.9000 mg/kg | Freq: Once | INTRAVENOUS | Status: AC
  Administered 2017-05-24: 74 mg via INTRAVENOUS
  Filled 2017-05-24: qty 100

## 2017-05-24 MED ORDER — PANTOPRAZOLE SODIUM 40 MG IV SOLR
40.0000 mg | Freq: Every day | INTRAVENOUS | Status: DC
  Administered 2017-05-24 – 2017-05-25 (×2): 40 mg via INTRAVENOUS
  Filled 2017-05-24 (×2): qty 40

## 2017-05-24 MED ORDER — SENNOSIDES-DOCUSATE SODIUM 8.6-50 MG PO TABS: 1.0000 | ORAL_TABLET | Freq: Every evening | ORAL | Status: DC | PRN

## 2017-05-24 MED ORDER — STROKE: EARLY STAGES OF RECOVERY BOOK
Freq: Once | Status: DC
  Filled 2017-05-24: qty 1

## 2017-05-24 NOTE — ED Triage Notes (Signed)
Pt to ER from PCP office where patient was being seen for unknown time of "not feeling well." per EMS last seen normal 12 pm today, expressive aphasia noted. No other neuro deficits. Pt following commands.

## 2017-05-24 NOTE — H&P (Addendum)
STROKE H&P  HPI:                                                                                                                                         Shawna Schneider is an 82 y.o. female who is brought from her PCP office to the emergency room due to the fact that she "was not feeling good" per EMS/ per PCP. Upon arrival to the ED patient was not speaking very much however when she did speak she initially started with clear fluent speech and then had made up words. Patient was able to follow commands but not do so with full effort. Patient CT was negative. Upon entering the room and second assessment was done, when asking patient to tell me her name and where she was she did not speak, when asked to move her extremities she would lift them however one hand was placed overhead the hand did not hit her head was diverted. When noxious stimuli was administered patient quickly was able to speak very clearly with no aphasia and no dysarthria. Initially she was not able to move her extremities however when noxious stimuli was given she had 5 out of 5 strength throughout. At a later point, the family arrived and said that she was probably on some blood thinners for irregular heart rhythm. Currently not on any blood thinners. I could not find any records in the electronic medical record of her having a.fibrillation. Also c/o LE pain. 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  Due to initial exam being inconsistent-tPA was not given and MRI ordered.  MRI showed scattered left MCA and cerebellar strokes. tPA was given  Date last known well: Date: 05/24/2017 Time last known well: Time: 1100 hrs tPA Given: yes -stroke on MRI with aphasia NIHSS 2 (one for aphasia and one for dysarthria) Modified Rankin: Rankin Score=0   Past Medical History:  Diagnosis Date  . Hyperlipemia     Past Surgical History:  Procedure Laterality Date  . KIDNEY SURGERY      No family history on file. Social History:  reports that she  has never smoked. She has never used smokeless tobacco. She reports that she does not drink alcohol or use drugs.  Allergies:  Allergies  Allergen Reactions  . Penicillins     Medications:  No current facility-administered medications for this encounter.    Current Outpatient Prescriptions  Medication Sig Dispense Refill  . indomethacin (INDOCIN) 25 MG capsule Take 1 capsule (25 mg total) by mouth 3 (three) times daily as needed (Take with food). 15 capsule 0  . Rosuvastatin Calcium (CRESTOR PO) Take by mouth.    . Telmisartan (MICARDIS PO) Take by mouth.    . TIMOLOL HEMIHYDRATE OP Apply to eye.       ROS:                                                                                                                                       History obtained from unobtainable from patient due to PATIENT WOULD NOT DIVULGE ANY INFORMATION    Neurologic Examination:                                                                                                      Blood pressure (!) 118/91, pulse 98, temperature (!) 97.5 F (36.4 C), temperature source Oral, resp. rate 19, SpO2 99 %.  HEENT-  Normocephalic, no lesions, without obvious abnormality.  Normal external eye and conjunctiva.  Normal TM's bilaterally.  Normal auditory canals and external ears. Normal external nose, mucus membranes and septum.  Normal pharynx. Cardiovascular- S1, S2 normal, pulses palpable throughout   Lungs- chest clear, no wheezing, rales, normal symmetric air entry Abdomen- normal findings: bowel sounds normal Extremities- no edema, no tenderness to palpation, no swelling, warm. Lymph-no adenopathy palpable Musculoskeletal-no joint tenderness, deformity or swelling Skin-warm and dry, no hyperpigmentation, vitiligo, or suspicious lesions  Neurological Examination Mental Status: Alert,  fluent, Unable to repeat.  Able to follow 3 step commands without difficulty when giving good effort. Appears frustrated when asked to follow commands Cranial Nerves: II: blinks to threat bilaterally III,IV, VI: ptosis not present, extra-ocular motions intact bilaterally, pupils equal, round, reactive to light and accommodation V,VII: smile symmetric, facial light touch sensation normal bilaterally VIII: hearing normal bilaterally IX,X: uvula rises symmetrically XI: bilateral shoulder shrug XII: midline tongue extension Motor: Right : Upper extremity   5/5    Left:     Upper extremity   5/5  Lower extremity   5/5     Lower extremity   5/5 Tone and bulk:normal tone throughout; no atrophy noted Sensory: Pinprick and light touch intact throughout, bilaterally Deep Tendon Reflexes: 2+ and symmetric throughout Plantars: Right: downgoing   Left: downgoing Cerebellar: Refused to take part in this exam however  watching her move her hands and legs she did not show any dysmetria. Gait: Not tested   Lab Results: Basic Metabolic Panel:  Recent Labs Lab 05/24/17 1320 05/24/17 1329  NA 138 140  K 3.8 3.9  CL 112* 114*  CO2 16*  --   GLUCOSE 90 85  BUN 37* 35*  CREATININE 3.22* 3.20*  CALCIUM 10.5*  --     Liver Function Tests:  Recent Labs Lab 05/24/17 1320  AST 20  ALT 13*  ALKPHOS 105  BILITOT 0.4  PROT 7.0  ALBUMIN 3.4*   No results for input(s): LIPASE, AMYLASE in the last 168 hours. No results for input(s): AMMONIA in the last 168 hours.  CBC:  Recent Labs Lab 05/24/17 1320 05/24/17 1329  WBC 8.2  --   NEUTROABS 5.8  --   HGB 9.1* 9.5*  HCT 28.9* 28.0*  MCV 82.3  --   PLT 246  --     Cardiac Enzymes: No results for input(s): CKTOTAL, CKMB, CKMBINDEX, TROPONINI in the last 168 hours.  Lipid Panel: No results for input(s): CHOL, TRIG, HDL, CHOLHDL, VLDL, LDLCALC in the last 168 hours.  CBG:  Recent Labs Lab 05/24/17 1320  GLUCAP 76     Microbiology: No results found for this or any previous visit.  Coagulation Studies:  Recent Labs  05/24/17 1320  LABPROT 14.2  INR 1.11    Imaging: Ct Head Code Stroke Wo Contrast`  Result Date: 05/24/2017 CLINICAL DATA:  Code stroke.  Aphasia and confusion. EXAM: CT HEAD WITHOUT CONTRAST TECHNIQUE: Contiguous axial images were obtained from the base of the skull through the vertex without intravenous contrast. COMPARISON:  None. FINDINGS: Brain: Moderate diffuse white matter changes are present. No acute or focal cortical abnormality is present. The basal ganglia are intact. The insular ribbon is normal bilaterally. The brainstem and cerebellum are normal. Vascular: Atherosclerotic calcifications are present within the cavernous internal carotid artery is bilaterally. There is no hyperdense vessel. Skull: The calvarium is intact. No focal lytic or blastic lesions are present. Sinuses/Orbits: The paranasal sinuses and mastoid air cells are clear. Bilateral lens replacements are present. The globes and orbits are within normal limits bilaterally. ASPECTS [facility] Stroke Program Early CT Score) - Ganglionic level infarction (caudate, lentiform nuclei, internal capsule, insula, M1-M3 cortex): 7/7 - Supraganglionic infarction (M4-M6 cortex): 3/3 Total score (0-10 with 10 being normal): 10/10 IMPRESSION: 1. No acute intracranial abnormality. 2. ASPECTS is 10/10 These results were called by telephone at the time of interpretation on 05/24/2017 at 1:53 pm to Dr. Rory Percy, who verbally acknowledged these results. Electronically Signed   By: San Morelle M.D.   On: 05/24/2017 13:53   Assessment and plan discussed with with attending physician and they are in agreement.    Etta Quill PA-C [facility] [phone]  05/24/2017, 3:01 PM   Assessment: 82 y.o. female presenting to the emergency department as a code stroke.  Upon initial assessment, I symptoms are only nonfluent  speech and some word finding difficulty without any weakness or cranial nerve findings. She was taken in for a stat MRI, because her exam was not clearcut of a acute stroke. mRI brain limited showed scattered reas of restricted diffusion in the left MCA territory in the left cerebellar hemisphere. She was taken out from the MRI scanner immediately and was administered IV TPA at that time as she was still within the 4-1/2 hour window since last seen normal.  PLAN: Acute Ischemic Stroke Cerebral infarction due to  embolism of left middle cerebral artery   Acuity: Acute Current Suspected Etiology: cardio embolic Continue Evaluation:  -Admit to:ICU-Blood pressure control, goal of SYS <180 -MRI/ECHO/A1C/Lipid panel. -Hyperglycemia management per SSI to maintain glucose 140-180mg /dL. -PT/OT/ST therapies and recommendations when able  CNS -Close neuro monitoring  Dysarthria Dysphagia following cerebral infarction  -NPO until cleared by speech -ST -Advance diet as tolerated   RESP No active issues. Monitor clinically  CV Essential (primary) hypertension -Aggressive BP control, goal SBP < 180 eart failure  -TTE  Hyperlipidemia, unspecified  - Statin for goal LDL < 70  ?Paroxysmal atrial fibrillation -keep on telemetry. Obtain records from outside for any evidence of old age of fibrillation  HEME Iron Deficiency Anemia -Monitor -transfuse for hgb < 7  Coagulopathy secondary to tPA administration -PT/INR in the morning  MSK LE dopplers for eval for DVT  ENDO -goal HgbA1c < 7  GI/GU -Gentle hydration  Fluid/Electrolyte Disorders -Replete -Repeat labs  ID Possible UTI -check UA  Nutrition E66.9 Obesity  -diet consult  Prophylaxis DVT: scd   GI: NA Bowel: doc/senna   Diet: NPO until cleared by bedside swallow or speech  Code Status: Full Code    THE FOLLOWING WERE PRESENT ON ADMISSION: CNS -  Aphasia, dysarthria CVS: HTN  Delays in TPA-patient's  initial examination was not clearcut for stroke. Was taken for stat MRI causing delays prior to making decision to use IV TPA. MRI positive for stroke.   Critical care addendum This patient is critically ill and at significant risk of neurological worsening, death and care requires constant monitoring of vital signs, hemodynamics,respiratory and cardiac monitoring, neurological assessment, discussion with family, other specialists and medical decision making of high complexity. I spent 55  minutes of neurocritical care time  in the care of  this patient. This time is independent of the time spent by the physician assistant.   -- Amie Portland, MD [facility] [phone]  If 7pm to 7am, please call on call as listed on AMION.

## 2017-05-24 NOTE — Code Documentation (Signed)
82yo female arriving to [facility] via [place] at 80.  Patient from PCP office where she was noted to be confused.  EMS reports she was speaking normally on arrival to appointment, LKW 1200.  It is unclear what the nature of the visit was.  Code stroke called for aphasia.  Stroke team to the bedside on arrival.  Patient to CT with team.  CT completed.  Patient with inconsistent exam as patient with episodes of incoherent speech followed by clear responses, and patient's arms fall to the bed avoiding patient's face on exam.  No acute stroke treatment at that time per Dr. Rory Percy.  On reassessment, patient with moderate aphasia with difficulty reading and naming cards.  Still difficult to examine d/t cooperation.  Dr. Rory Percy notified and patient to STAT MRI.  MRI showing infarct on DWI imaging and Dr. Rory Percy notified.  MRA and FLAIR imaging completed and MRI stopped.  Pharmacy made aware of need for tPA.  Patient to Trauma B.  2nd PIV started by ED RN.  Order from MD to treat with tPA.  BP within tPA parameters.  7mg  tPA bolus given at 1511 followed by 67mg /hr for a total of 74mg  per pharmacy dosing.  Patient to be monitored frequently per post-tPA protocol.  Patient to be admitted to ICU, ICU charge RN notified.  Plan of care discussed with patient and family.  Bedside handoff with ED RN Danelle Berry.

## 2017-05-24 NOTE — Progress Notes (Signed)
Called by nursing for soft BP with most recent reading 94/52. Per patient, she has not been told by her physicians that her BP runs low. Of note, in the ED, bright red blood in stools was noted after tPA. Her BUN and Cr are elevated, the latter significantly higher than most recent baseline. The patient is currently asymptomatic and speaking normally with staff.   A/R: 82 year old female presenting with left MCA stroke. She is status post tPA. Now hypotensive with lower GIB. 1. 1 L bolus of NS. No history of CHF, leg swelling or orthopnea per patient.  2. Continue IVF at 75 cc/hr after bolus 3. Type and screen 4. STAT hemoglobin and hematocrit. 5. Stroke team to consider obtaining Nephrology consult in AM regarding worsened renal function from her baseline. 6. May have internal hemorrhoid or more concerning lesion predisposing to lower GI bleeding. Stroke team to consider obtaining GI consult in the AM.   Electronically signed: Dr. Kerney Elbe

## 2017-05-24 NOTE — Consult Note (Deleted)
Requesting Physician: Dr. Tamera Punt    Chief Complaint: confusion/ aphasia  History obtained from:   Chart/ EMS  HPI:                                                                                                                                         Shawna Schneider is an 82 y.o. female who is brought from her PCP office to the emergency room due to the fact that she "was not feeling good" per EMS/ per PCP. Upon arrival to the ED patient was not speaking very much however when she did speak she initially started with clear fluent speech and then had made up words. Patient was able to follow commands but not do so with full effort. Patient CT was negative. Upon entering the room and second assessment was done, when asking patient to tell me her name and where she was she did not speak, when asked to move her extremities she would lift them however one hand was placed overhead the hand did not hit her head was diverted. When noxious stimuli was administered patient quickly was able to speak very clearly with no aphasia and no dysarthria. Initially she was not able to move her extremities however when noxious stimuli was given she had 5 out of 5 strength throughout.  Date last known well: Date: 05/24/2017 Time last known well: Time: 12:00 tPA Given: No: no symptoms NIHSS 0  Modified Rankin: Rankin Score=0   Past Medical History:  Diagnosis Date  . Hyperlipemia     Past Surgical History:  Procedure Laterality Date  . KIDNEY SURGERY      No family history on file. Social History:  reports that she has never smoked. She does not have any smokeless tobacco history on file. She reports that she does not drink alcohol or use drugs.  Allergies:  Allergies  Allergen Reactions  . Penicillins     Medications:                                                                                                                          No current facility-administered medications for this  encounter.    Current Outpatient Prescriptions  Medication Sig Dispense Refill  . indomethacin (INDOCIN) 25 MG capsule Take 1 capsule (25 mg total) by mouth 3 (three) times daily as needed (Take with food). 15 capsule  0  . Rosuvastatin Calcium (CRESTOR PO) Take by mouth.    . Telmisartan (MICARDIS PO) Take by mouth.    . TIMOLOL HEMIHYDRATE OP Apply to eye.         ROS:                                                                                                                                       History obtained from unobtainable from patient due to PATIENT WOULD NOT DIVULGE ANY INFORMATION    Neurologic Examination:                                                                                                      There were no vitals taken for this visit.  HEENT-  Normocephalic, no lesions, without obvious abnormality.  Normal external eye and conjunctiva.  Normal TM's bilaterally.  Normal auditory canals and external ears. Normal external nose, mucus membranes and septum.  Normal pharynx. Cardiovascular- S1, S2 normal, pulses palpable throughout   Lungs- chest clear, no wheezing, rales, normal symmetric air entry Abdomen- normal findings: bowel sounds normal Extremities- no edema Lymph-no adenopathy palpable Musculoskeletal-no joint tenderness, deformity or swelling Skin-warm and dry, no hyperpigmentation, vitiligo, or suspicious lesions  Neurological Examination Mental Status: Alert,SPEAKS VERY CLEARLY AND WITHDRAWS FROM PAIN BRISKLY.  Speech fluent without evidence of aphasia.  Able to follow 3 step commands without difficulty when giving good effort Cranial Nerves: II: blinks to threat bilaterally III,IV, VI: ptosis not present, extra-ocular motions intact bilaterally, pupils equal, round, reactive to light and accommodation V,VII: smile symmetric, facial light touch sensation normal bilaterally VIII: hearing normal bilaterally IX,X: uvula rises symmetrically XI:  bilateral shoulder shrug XII: midline tongue extension Motor: Right : Upper extremity   5/5    Left:     Upper extremity   5/5  Lower extremity   5/5     Lower extremity   5/5 Tone and bulk:normal tone throughout; no atrophy noted Sensory: Pinprick and light touch intact throughout, bilaterally Deep Tendon Reflexes: 2+ and symmetric throughout Plantars: Right: downgoing   Left: downgoing Cerebellar: Refused to take part in this exam however watching her move her hands and legs she did not show any dysmetria. Gait: Not tested       Lab Results: Basic Metabolic Panel:  Recent Labs Lab 05/24/17 1329  NA 140  K 3.9  CL 114*  GLUCOSE 85  BUN 35*  CREATININE 3.20*    Liver Function Tests: No results  for input(s): AST, ALT, ALKPHOS, BILITOT, PROT, ALBUMIN in the last 168 hours. No results for input(s): LIPASE, AMYLASE in the last 168 hours. No results for input(s): AMMONIA in the last 168 hours.  CBC:  Recent Labs Lab 05/24/17 1320 05/24/17 1329  WBC 8.2  --   NEUTROABS 5.8  --   HGB 9.1* 9.5*  HCT 28.9* 28.0*  MCV 82.3  --   PLT 246  --     Cardiac Enzymes: No results for input(s): CKTOTAL, CKMB, CKMBINDEX, TROPONINI in the last 168 hours.  Lipid Panel: No results for input(s): CHOL, TRIG, HDL, CHOLHDL, VLDL, LDLCALC in the last 168 hours.  CBG:  Recent Labs Lab 05/24/17 1320  GLUCAP 76    Microbiology: No results found for this or any previous visit.  Coagulation Studies: No results for input(s): LABPROT, INR in the last 72 hours.  Imaging: No results found.     Assessment and plan discussed with with attending physician and they are in agreement.    Etta Quill PA-C [facility] [phone]  05/24/2017, 1:37 PM   Assessment: 82 y.o. female presenting to the emergency department as a code stroke. At this time she has no localizing or lateralizing symptoms. She gives very poor effort but however when giving effort she shows  good strength and no stroke like symptoms. When speaking she shows no a aphasia or dysarthria. At this time do not suspect stroke however given her risk factor of hyperlipidemia will obtain MRI to evaluate for possible stroke that might have resolved.  Stroke Risk Factors - hyperlipidemia  Recommend: -MRI of brain/MRA of brain without contrast if negative no further stroke workup.

## 2017-05-24 NOTE — ED Provider Notes (Signed)
[facility] EMERGENCY DEPARTMENT Provider Note   CSN: [id number] Arrival date & time: 05/24/17  1316     History   Chief Complaint Chief Complaint  Patient presents with  . Code Stroke    HPI Shawna Schneider is a 82 y.o. female.  Patient is a 82 year old female with a history of hyperlipidemia who presents s a code stroke. Reportedly she was at her PCPs office today and was noted to have some aphasia. She was last seen normal at 12:00, reportedly she went up to get some blood work and when she came back down and noted that she wasn't talking correctly. She hasn't had any noted numbness or weakness to her extremities. It's unclear exactly why she was at the doctor's office, her friend states that she was therefore routine appointmentfollow the patient says she hasn't really been feeling well for the last few days but can't really elaborate on that.      Past Medical History:  Diagnosis Date  . Hyperlipemia     Patient Active Problem List   Diagnosis Date Noted  . CVA (cerebral vascular accident) ([place]) 05/24/2017    Past Surgical History:  Procedure Laterality Date  . KIDNEY SURGERY      OB History    No data available       Home Medications    Prior to Admission medications   Medication Sig Start Date End Date Taking? Authorizing Provider  indomethacin (INDOCIN) 25 MG capsule Take 1 capsule (25 mg total) by mouth 3 (three) times daily as needed (Take with food). 05/06/12   Janne Napoleon, NP  Rosuvastatin Calcium (CRESTOR PO) Take by mouth.    [provider]  Telmisartan (MICARDIS PO) Take by mouth.    [provider]  TIMOLOL HEMIHYDRATE OP Apply to eye.    [provider]    Family History No family history on file.  Social History Social History  Substance Use Topics  . Smoking status: Never Smoker  . Smokeless tobacco: Never Used  . Alcohol use No     Allergies   Penicillins   Review of  Systems Review of Systems  Unable to perform ROS: Mental status change     Physical Exam Updated Vital Signs BP 117/72   Pulse 95   Temp (!) 97.5 F (36.4 C) (Oral)   Resp 15   SpO2 94%   Physical Exam  Constitutional: She is oriented to person, place, and time. She appears well-developed and well-nourished.  HENT:  Head: Normocephalic and atraumatic.  Eyes: Pupils are equal, round, and reactive to light.  Neck: Normal range of motion. Neck supple.  Cardiovascular: Normal rate, regular rhythm and normal heart sounds.   Pulmonary/Chest: Effort normal and breath sounds normal. No respiratory distress. She has no wheezes. She has no rales. She exhibits no tenderness.  Abdominal: Soft. Bowel sounds are normal. There is no tenderness. There is no rebound and no guarding.  Musculoskeletal: Normal range of motion. She exhibits no edema.  Lymphadenopathy:    She has no cervical adenopathy.  Neurological: She is alert and oriented to person, place, and time.  Patient has 4 out of 5 strength in all extremities. She has grossly intact sensation to light touch in all extremity. No facial drooping. She has some intermittent aphasia (garbled speech), no facial drooping  Skin: Skin is warm and dry. No rash noted.  Psychiatric: She has a normal mood and affect.     ED  Treatments / Results  Labs (all labs ordered are listed, but only abnormal results are displayed) Labs Reviewed  CBC - Abnormal; Notable for the following:       Result Value   RBC 3.51 (*)    Hemoglobin 9.1 (*)    HCT 28.9 (*)    MCH 25.9 (*)    RDW 16.0 (*)    All other components within normal limits  COMPREHENSIVE METABOLIC PANEL - Abnormal; Notable for the following:    Chloride 112 (*)    CO2 16 (*)    BUN 37 (*)    Creatinine, Ser 3.22 (*)    Calcium 10.5 (*)    Albumin 3.4 (*)    ALT 13 (*)    GFR calc non Af Amer 13 (*)    GFR calc Af Amer 15 (*)    All other components within normal limits  I-STAT CHEM  8, ED - Abnormal; Notable for the following:    Chloride 114 (*)    BUN 35 (*)    Creatinine, Ser 3.20 (*)    TCO2 18 (*)    Hemoglobin 9.5 (*)    HCT 28.0 (*)    All other components within normal limits  PROTIME-INR  APTT  DIFFERENTIAL  I-STAT TROPONIN, ED  CBG MONITORING, ED    EKG  EKG Interpretation  Date/Time:  Monday May 24 2017 13:42:11 EDT Ventricular Rate:  100 PR Interval:    QRS Duration: 90 QT Interval:  358 QTC Calculation: 462 R Axis:   60 Text Interpretation:  Sinus tachycardia Abnormal R-wave progression, early transition Borderline T abnormalities, inferior leads Artifact in lead(s) I II aVR aVL since last tracing no significant change Confirmed by Malvin Johns [phone]) on 05/24/2017 1:55:50 PM       Radiology Mr Angiogram Head Wo Contrast  Result Date: 05/24/2017 CLINICAL DATA:  82 y/o  F; expressive aphasia. EXAM: MRI HEAD WITHOUT CONTRAST MRA HEAD WITHOUT CONTRAST TECHNIQUE: Multiplanar, multiecho pulse sequences of the brain and surrounding structures were obtained without intravenous contrast. Angiographic images of the head were obtained using MRA technique without contrast. COMPARISON:  05/24/2017 CT head. FINDINGS: MRI HEAD FINDINGS Brain: Small region of reduced diffusion involving the left posterior insula and extending into the left frontoparietal junction cortex compatible with acute/early subacute infarction. Several additional punctate foci of reduced diffusion are present in the bilateral frontal and parietal lobes as well as bilateral cerebellar hemispheres. In the left posterior parietal lobe there is a small region of diffusion and T2 FLAIR hyperintensity without reduced diffusion on ADC probably representing a subacute infarction. There additional foci of T2 FLAIR hyperintense signal abnormality in subcortical periventricular white matter throughout the supratentorial brain that are nonspecific but likely represent moderate chronic  microvascular ischemic changes. Moderate brain parenchymal volume loss. Vascular: As below. Skull and upper cervical spine: Normal marrow signal. Sinuses/Orbits: Negative. Other: None. MRA HEAD FINDINGS Anterior circulation: Right cavernous ICA laterally directed 2 mm sacculation. No additional finding for occlusion, aneurysm, or significant stenosis in bilateral ICA, bilateral ACA, and right MCA. Patent left M1 and proximal M2 branches. Poor flow related signal within left M2 inferior division (series 4, image 105 and series 406, image 5). Posterior circulation: No large vessel occlusion, aneurysm, or significant stenosis is identified. Anatomic variation: Patent anterior communicating artery. Bilateral fetal PCA. IMPRESSION: MRI head: 1. Small region of acute/early subacute infarction within the left posterior insula extending into left frontoparietal junction. 2. Additional scattered punctate foci of reduced diffusion  are present in the bilateral convexities and cerebellar hemispheres. Multiple vascular territories suggests embolic phenomenon. 3. Late subacute to chronic infarct within left occipital lobe. 4. Moderate chronic microvascular ischemic changes and moderate parenchymal volume loss of the brain. MRA head: 1. Poor flow related signal within left MCA M2 inferior sylvian division likely representing thromboembolic occlusion. 2. Otherwise patent circle of Willis. No aneurysm or high-grade stenosis. These results will be called to the ordering clinician or representative by the Radiologist Assistant, and communication documented in the PACS or zVision Dashboard. Electronically Signed   By: Kristine Garbe M.D.   On: 05/24/2017 15:28   Mr Brain Wo Contrast  Result Date: 05/24/2017 CLINICAL DATA:  82 y/o  F; expressive aphasia. EXAM: MRI HEAD WITHOUT CONTRAST MRA HEAD WITHOUT CONTRAST TECHNIQUE: Multiplanar, multiecho pulse sequences of the brain and surrounding structures were obtained without  intravenous contrast. Angiographic images of the head were obtained using MRA technique without contrast. COMPARISON:  05/24/2017 CT head. FINDINGS: MRI HEAD FINDINGS Brain: Small region of reduced diffusion involving the left posterior insula and extending into the left frontoparietal junction cortex compatible with acute/early subacute infarction. Several additional punctate foci of reduced diffusion are present in the bilateral frontal and parietal lobes as well as bilateral cerebellar hemispheres. In the left posterior parietal lobe there is a small region of diffusion and T2 FLAIR hyperintensity without reduced diffusion on ADC probably representing a subacute infarction. There additional foci of T2 FLAIR hyperintense signal abnormality in subcortical periventricular white matter throughout the supratentorial brain that are nonspecific but likely represent moderate chronic microvascular ischemic changes. Moderate brain parenchymal volume loss. Vascular: As below. Skull and upper cervical spine: Normal marrow signal. Sinuses/Orbits: Negative. Other: None. MRA HEAD FINDINGS Anterior circulation: Right cavernous ICA laterally directed 2 mm sacculation. No additional finding for occlusion, aneurysm, or significant stenosis in bilateral ICA, bilateral ACA, and right MCA. Patent left M1 and proximal M2 branches. Poor flow related signal within left M2 inferior division (series 4, image 105 and series 406, image 5). Posterior circulation: No large vessel occlusion, aneurysm, or significant stenosis is identified. Anatomic variation: Patent anterior communicating artery. Bilateral fetal PCA. IMPRESSION: MRI head: 1. Small region of acute/early subacute infarction within the left posterior insula extending into left frontoparietal junction. 2. Additional scattered punctate foci of reduced diffusion are present in the bilateral convexities and cerebellar hemispheres. Multiple vascular territories suggests embolic  phenomenon. 3. Late subacute to chronic infarct within left occipital lobe. 4. Moderate chronic microvascular ischemic changes and moderate parenchymal volume loss of the brain. MRA head: 1. Poor flow related signal within left MCA M2 inferior sylvian division likely representing thromboembolic occlusion. 2. Otherwise patent circle of Willis. No aneurysm or high-grade stenosis. These results will be called to the ordering clinician or representative by the Radiologist Assistant, and communication documented in the PACS or zVision Dashboard. Electronically Signed   By: Kristine Garbe M.D.   On: 05/24/2017 15:28   Ct Head Code Stroke Wo Contrast`  Result Date: 05/24/2017 CLINICAL DATA:  Code stroke.  Aphasia and confusion. EXAM: CT HEAD WITHOUT CONTRAST TECHNIQUE: Contiguous axial images were obtained from the base of the skull through the vertex without intravenous contrast. COMPARISON:  None. FINDINGS: Brain: Moderate diffuse white matter changes are present. No acute or focal cortical abnormality is present. The basal ganglia are intact. The insular ribbon is normal bilaterally. The brainstem and cerebellum are normal. Vascular: Atherosclerotic calcifications are present within the cavernous internal carotid artery is bilaterally. There  is no hyperdense vessel. Skull: The calvarium is intact. No focal lytic or blastic lesions are present. Sinuses/Orbits: The paranasal sinuses and mastoid air cells are clear. Bilateral lens replacements are present. The globes and orbits are within normal limits bilaterally. ASPECTS [facility] Stroke Program Early CT Score) - Ganglionic level infarction (caudate, lentiform nuclei, internal capsule, insula, M1-M3 cortex): 7/7 - Supraganglionic infarction (M4-M6 cortex): 3/3 Total score (0-10 with 10 being normal): 10/10 IMPRESSION: 1. No acute intracranial abnormality. 2. ASPECTS is 10/10 These results were called by telephone at the time of interpretation on 05/24/2017  at 1:53 pm to Dr. Rory Percy, who verbally acknowledged these results. Electronically Signed   By: San Morelle M.D.   On: 05/24/2017 13:53    Procedures Procedures (including critical care time)  Medications Ordered in ED Medications  rosuvastatin (CRESTOR) tablet 5 mg (0 mg Oral Hold 05/24/17 1531)   stroke: mapping our early stages of recovery book (not administered)  0.9 %  sodium chloride infusion ( Intravenous New Bag/Given 05/24/17 1551)  acetaminophen (TYLENOL) tablet 650 mg (not administered)    Or  acetaminophen (TYLENOL) solution 650 mg (not administered)    Or  acetaminophen (TYLENOL) suppository 650 mg (not administered)  senna-docusate (Senokot-S) tablet 1 tablet (not administered)  pantoprazole (PROTONIX) injection 40 mg (not administered)  alteplase (ACTIVASE) 1 mg/mL infusion 74 mg (74 mg Intravenous New Bag/Given 05/24/17 1511)     Initial Impression / Assessment and Plan / ED Course  I have reviewed the triage vital signs and the nursing notes.  Pertinent labs & imaging results that were available during my care of the patient were reviewed by me and considered in my medical decision making (see chart for details).     Patient is a 82 year old female who presents from her doctor's office with aphasia. CT scan did not show any evidence of acute stroke or intracranial hemorrhage. A follow-up MRI was performed which does show evidence of acute stroke with thrombus in the left MCA. She was given TPA by the neuro hospitalist. She will be admitted for further treatment by the neurohospitalist.  I did speak with her PCP, Dr. Shelia Media, who says gave me some information on her baseline labs, her last creatinine was 2.1, last hgb was around 10.  She is also followed by a nephrologist.  Final Clinical Impressions(s) / ED Diagnoses   Final diagnoses:  Acute ischemic stroke [facility])    New Prescriptions New Prescriptions   No medications on file     Malvin Johns,  MD 05/24/17 1556

## 2017-05-24 NOTE — Progress Notes (Signed)
Same day progress note Called by patient's emergency room nurse regarding concern for GI bleed when the patient had a bowel movement. Vital signs stable. Exam is improved with marked improvement in her aphasia  Her CBC showed chronic anemia.  At this time, I will order a repeat H&H 6 hours from the prior test.  We will observe clinically.  We'll consider GI consult if she has drop in her hemoglobin. Transfuse for hemoglobin less than 7.  Amie Portland, MD [facility] [phone]  If 7pm to 7am, please call on call as listed on AMION.

## 2017-05-24 NOTE — ED Notes (Signed)
Patient placed on bedpan, had a medium sized stool, water consistency, bright red. MD Rory Percy paged and notified. Given orders to redraw H/H at 10 pm tonight and to continue to observe. Pt denies pain. VS remain stable.

## 2017-05-24 NOTE — ED Notes (Signed)
TPA initiated.  

## 2017-05-24 NOTE — H&P (Deleted)
MD notified of hypotension. Patient with no neurological changes and asymptomatic. Order placed for hemoglobin and hematocrit and type and screen as directed by MD. Patient also receiving 1 liter NS bolus as directed. Will continue to monitor.

## 2017-05-25 ENCOUNTER — Inpatient Hospital Stay (HOSPITAL_COMMUNITY): Payer: BC Managed Care – PPO

## 2017-05-25 ENCOUNTER — Encounter (HOSPITAL_COMMUNITY): Payer: Self-pay | Admitting: Emergency Medicine

## 2017-05-25 DIAGNOSIS — I6300 Cerebral infarction due to thrombosis of unspecified precerebral artery: Secondary | ICD-10-CM

## 2017-05-25 DIAGNOSIS — I639 Cerebral infarction, unspecified: Secondary | ICD-10-CM

## 2017-05-25 DIAGNOSIS — M79609 Pain in unspecified limb: Secondary | ICD-10-CM

## 2017-05-25 DIAGNOSIS — I63412 Cerebral infarction due to embolism of left middle cerebral artery: Principal | ICD-10-CM

## 2017-05-25 DIAGNOSIS — R710 Precipitous drop in hematocrit: Secondary | ICD-10-CM

## 2017-05-25 DIAGNOSIS — K922 Gastrointestinal hemorrhage, unspecified: Secondary | ICD-10-CM

## 2017-05-25 DIAGNOSIS — D649 Anemia, unspecified: Secondary | ICD-10-CM

## 2017-05-25 DIAGNOSIS — Z7901 Long term (current) use of anticoagulants: Secondary | ICD-10-CM

## 2017-05-25 DIAGNOSIS — K625 Hemorrhage of anus and rectum: Secondary | ICD-10-CM

## 2017-05-25 LAB — VAS US CAROTID
LCCADSYS: -52 cm/s
LCCAPDIAS: -15 cm/s
LCCAPSYS: -59 cm/s
LEFT ECA DIAS: -3 cm/s
LEFT VERTEBRAL DIAS: 17 cm/s
LICAPDIAS: -13 cm/s
Left CCA dist dias: -20 cm/s
Left ICA dist dias: -20 cm/s
Left ICA dist sys: -75 cm/s
Left ICA prox sys: -37 cm/s
RCCADSYS: -69 cm/s
RCCAPDIAS: 19 cm/s
RCCAPSYS: 67 cm/s
RIGHT ECA DIAS: 8 cm/s
RIGHT VERTEBRAL DIAS: 11 cm/s

## 2017-05-25 LAB — CBC
HCT: 31.8 % — ABNORMAL LOW (ref 36.0–46.0)
HEMOGLOBIN: 10.3 g/dL — AB (ref 12.0–15.0)
MCH: 26.9 pg (ref 26.0–34.0)
MCHC: 32.4 g/dL (ref 30.0–36.0)
MCV: 83 fL (ref 78.0–100.0)
Platelets: 212 10*3/uL (ref 150–400)
RBC: 3.83 MIL/uL — AB (ref 3.87–5.11)
RDW: 17.9 % — ABNORMAL HIGH (ref 11.5–15.5)
WBC: 9.5 10*3/uL (ref 4.0–10.5)

## 2017-05-25 LAB — LIPID PANEL
CHOL/HDL RATIO: 3.4 ratio
Cholesterol: 94 mg/dL (ref 0–200)
HDL: 28 mg/dL — ABNORMAL LOW (ref >40)
LDL Cholesterol: 48 mg/dL (ref 0–99)
Triglycerides: 89 mg/dL (ref <150)
VLDL: 18 mg/dL (ref 0–40)

## 2017-05-25 LAB — HEMOGLOBIN AND HEMATOCRIT, BLOOD
HCT: 23.2 % — ABNORMAL LOW (ref 36.0–46.0)
HEMOGLOBIN: 7.5 g/dL — AB (ref 12.0–15.0)

## 2017-05-25 LAB — ABO/RH: ABO/RH(D): O POS

## 2017-05-25 LAB — PREPARE RBC (CROSSMATCH)

## 2017-05-25 LAB — HEMOGLOBIN A1C
Hgb A1c MFr Bld: 6.5 % — ABNORMAL HIGH (ref 4.8–5.6)
Mean Plasma Glucose: 139.85 mg/dL

## 2017-05-25 MED ORDER — SODIUM CHLORIDE 0.9 % IV SOLN
Freq: Once | INTRAVENOUS | Status: AC
  Administered 2017-05-25: 02:00:00 via INTRAVENOUS

## 2017-05-25 MED ORDER — LATANOPROST 0.005 % OP SOLN
1.0000 [drp] | Freq: Every day | OPHTHALMIC | Status: DC
  Administered 2017-05-29 – 2017-05-30 (×2): 1 [drp] via OPHTHALMIC
  Filled 2017-05-25 (×2): qty 2.5

## 2017-05-25 MED ORDER — ASPIRIN EC 81 MG PO TBEC
81.0000 mg | DELAYED_RELEASE_TABLET | Freq: Every day | ORAL | Status: DC
  Administered 2017-05-25 – 2017-06-02 (×9): 81 mg via ORAL
  Filled 2017-05-25 (×9): qty 1

## 2017-05-25 MED ORDER — LATANOPROST 0.005 % OP SOLN: 1.0000 [drp] | Freq: Every day | OPHTHALMIC | Status: DC

## 2017-05-25 NOTE — Progress Notes (Addendum)
STROKE TEAM PROGRESS NOTE   HISTORY OF PRESENT ILLNESS (per record) LUTRICIA WIDJAJA is an 82 y.o. female who is brought from her PCP office to the emergency room due to the fact that she "was not feeling good" per EMS/ per PCP. Upon arrival to the ED patient was not speaking very much however when she did speak she initially started with clear fluent speech and then had made up words. Patient was able to follow commands but not do so with full effort. Patient CT was negative. Upon entering the room and second assessment was done, when asking patient to tell me her name and where she was she did not speak, when asked to move her extremities she would lift them however one hand was placed overhead the hand did not hit her head was diverted. When noxious stimuli was administered patient quickly was able to speak very clearly with no aphasia and no dysarthria. Initially she was not able to move her extremities however when noxious stimuli was given she had 5 out of 5 strength throughout. At a later point, the family arrived and said that she was probably on some blood thinners for irregular heart rhythm. Currently not on any blood thinners. I could not find any records in the electronic medical record of her having a.fibrillation. Also c/o LE pain. 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  Due to initial exam being inconsistent-tPA was not given and MRI ordered.  MRI showed scattered left MCA and cerebellar strokes. tPA was given and she was admitted to Neuro ICU for observation.  Date last known well: Date: 05/24/2017 Time last known well: Time: 1100 hrs tPA Given: yes -stroke on MRI with aphasia NIHSS 2 (one for aphasia and one for dysarthria) Modified Rankin: Rankin Score=0   SUBJECTIVE (INTERVAL HISTORY) She feels nearly or completely improved today with resolution of her dysarthria shortly after tPA administered yesterday. She experienced bright red rectal bleeding with associated decrease in hemoglobin and  was transfused 2 units of pRBCs overnight. Now she is sitting comfortably eating breakfast unaided. She developed lower GI bleed early this morning and dropped hematocrit from 26 to 23 and was transfused 2 units of blood and follow-up hematocrit is pending   OBJECTIVE CBC:  Recent Labs Lab 05/24/17 1320  05/24/17 2348 05/25/17 1032  WBC 8.2  --   --  9.5  NEUTROABS 5.8  --   --   --   HGB 9.1*  < > 7.5* 10.3*  HCT 28.9*  < > 23.2* 31.8*  MCV 82.3  --   --  83.0  PLT 246  --   --  212  < > = values in this interval not displayed.  Basic Metabolic Panel:   Recent Labs Lab 05/24/17 1320 05/24/17 1329  NA 138 140  K 3.8 3.9  CL 112* 114*  CO2 16*  --   GLUCOSE 90 85  BUN 37* 35*  CREATININE 3.22* 3.20*  CALCIUM 10.5*  --     Lipid Panel:     Component Value Date/Time   CHOL 94 05/24/2017 2355   TRIG 89 05/24/2017 2355   HDL 28 (L) 05/24/2017 2355   CHOLHDL 3.4 05/24/2017 2355   VLDL 18 05/24/2017 2355   LDLCALC 48 05/24/2017 2355   HgbA1c:  Lab Results  Component Value Date   HGBA1C 6.5 (H) 05/24/2017   Urine Drug Screen: No results found for: LABOPIA, COCAINSCRNUR, LABBENZ, AMPHETMU, THCU, LABBARB  Alcohol Level No results found  for: [facility]  IMAGING  Mr Brain Wo Contrast 05/24/2017 1. Small region of acute/early subacute infarction within the left posterior insula extending into left frontoparietal junction. 2. Additional scattered punctate foci of reduced diffusion are present in the bilateral convexities and cerebellar hemispheres. Multiple vascular territories suggests embolic phenomenon. 3. Late subacute to chronic infarct within left occipital lobe. 4. Moderate chronic microvascular ischemic changes and moderate parenchymal volume loss of the brain.   Mr Angiogram Head Wo Contrast 05/24/2017 1. Poor flow related signal within left MCA M2 inferior sylvian division likely representing thromboembolic occlusion. 2. Otherwise patent circle of Willis. No  aneurysm or high-grade stenosis.  Ct Head Code Stroke Wo Contrast` 05/24/2017 IMPRESSION: 1. No acute intracranial abnormality. 2. ASPECTS is 10/10   PHYSICAL EXAM  Temp:  [97.5 F (36.4 C)-99.2 F (37.3 C)] 98.6 F (37 C) (10/16 1200) Pulse Rate:  [69-98] 91 (10/16 1200) Resp:  [11-31] 24 (10/16 1200) BP: (81-139)/(46-94) 131/77 (10/16 1200) SpO2:  [93 %-100 %] 98 % (10/16 1200) Weight:  [182 lb 8.7 oz (82.8 kg)] 182 lb 8.7 oz (82.8 kg) (10/15 2000)  General - Well nourished, well developedelderly lady, in no apparent distress.  Cardiovascular - Regular rate and rhythm.  Mental Status -  Level of arousal and orientation to time, place, and person were intact. Language including expression, naming, repetition, comprehension was intact. Attention span and concentration were normal. Recent and remote memory were intact.  Cranial Nerves II - XII - II - Visual field intact OU. III, IV, VI - Extraocular movements intact. V - Facial sensation intact bilaterally. VII - Facial movement intact bilaterally. VIII - Hearing & vestibular intact bilaterally. X - Palate elevates symmetrically. XI - Chin turning & shoulder shrug intact bilaterally. XII - Tongue protrusion intact.  Motor Strength - The patient's strength was normal in all extremities and pronator drift was absent.   Motor Tone - Muscle tone was assessed at the neck and appendages and was normal.  Reflexes - The patient's reflexes were 1+ in all extremities and she had no pathological reflexes.  Sensory - Light touch was assessed and was symmetrical.    Coordination - The patient had normal movements in the hands and feet with no ataxia or dysmetria.  Tremor was absent.  Gait and Station - Not observed   ASSESSMENT/PLAN Ms. CHINMAYI RUMER is a 82 y.o. female with history of hypertension, hyperlipidemia, diverticulosis, remote nephrectomy presenting with new onset dysarthria and expressive aphasia. She received  tPA on 10/15 @at  1511.   Stroke:  Left posterior insula infarct, bilateral punctate diffusion restriction in cerebellar hemispheres consistent with embolic phenomenon from central source  Resultant  minimal  CT head No acute abnormality  MRI head Small region of acute/early subacute infarction within the left posterior insula extending into left frontoparietal junction, scattered punctate foci of reduced diffusion in the bilateral convexities and cerebellar hemispheres.  MRA head  Poor flow related signal within left MCA M2 inferior sylvian division likely representing thromboembolic occlusion  Carotid Doppler  Prelim read no significant stenosis  2D Echo  Pending  LEDVT Study Prelim read R popliteal DVT  LDL 48  HgbA1c 6.5%  SCDs for VTE prophylaxis, can resume enoxaparin tomorrow Diet heart healthy/carb modified Room service appropriate? Yes; Fluid consistency: Thin  No antithrombotic prior to admission, now on No antithrombotic  Patient counseled to be compliant with her antithrombotic medications  Ongoing aggressive stroke risk factor management  Therapy recommendations:  Pending  Disposition:  Pending  Hypertension  Stable  Tight control 2/2 tPA administration  Long-term BP goal normotensive  Hyperlipidemia  Home meds: gemfibrozil  LDL 48, goal < 70  Added Rosuvastatin 5mg   Continue statin at discharge  Diabetes  HgbA1c 6.5%, goal < 7.0  Controlled  Other Stroke Risk Factors  Advanced age  Obesity, Body mass index is 32.34 kg/m., recommend weight loss, diet and exercise as appropriate   Other Active Problems  Lower GI bleeding after tPA administration, Hx of diveritculosis Hgb 7.5->10.3 after 2 units pRBCs  Suspect paroxysmal atrial fibrillation given self reported history of arrhythmia, not correlated by records so far. Embolic pattern of stroke would be consistent with cardiac source or paradoxical embolism. Benefit of workup would only be if  appropriate and willing for long term anticoagulation.  Other Plans  Discontinue bed rest and start therapy  Observing in Neuro ICU till today PM or tomorrow AM per room availability  GI consultation is appreciated for bleeding  Hospital day # 1  I have personally examined this patient, reviewed notes, independently viewed imaging studies, participated in medical decision making and plan of care.ROS completed by me personally and pertinent positives fully documented  I have made any additions or clarifications directly to the above note.  She presented with expressive aphasia secondary to left MCA branch infarct from atrial fibrillation and MRI shows multiple cerebral infarcts. She developed post tPA and lower GI hemorrhage likely from underlying diverticulosis. She has been transferred to units of blood. Check follow-up hematocrit. GI consult is pending. May need to considersafety of long-term anticoagulation for A. Fib versus risk for GI bleed from diverticulosis before making final decision prior to discharge. Close neurological monitoring and strict blood pressure control as per post TPA protocol.No family available at bedside for discussion. Check follow-up brain imaging study later today This patient is critically ill and at significant risk of neurological worsening, death and care requires constant monitoring of vital signs, hemodynamics,respiratory and cardiac monitoring, extensive review of multiple databases, frequent neurological assessment, discussion with family, other specialists and medical decision making of high complexity.I have made any additions or clarifications directly to the above note.This critical care time does not reflect procedure time, or teaching time or supervisory time of PA/NP/Med Resident etc but could involve care discussion time.  I spent 35 minutes of neurocritical care time  in the care of  this patient.     Antony Contras, MD Medical Director [facility] Pager: [phone] 05/25/2017 2:25 PM  ADDENDUM : lower extremity venous Dopplers show bilateral DVT involving thigh  and calf muscles. Patient is high risk for bleeding given recent lower GI bleeding with IV TPA. I have discussed the case with Dr. Owens Loffler gastroenterologist and will make final decision on anticoagulation after GI workup for her bleeding source.Patient may need IVC filter if she is not a good long-term anticoagulation candidate. Discussed with patient and Dr. Ardis Hughs and they are in agreement with the plan. Antony Contras, MD 05/25/2017 1523 hrs  To contact Stroke Continuity provider, please refer to [email or web address] After hours, contact General Neurology

## 2017-05-25 NOTE — Progress Notes (Addendum)
Hemoglobin resulted at 7.5. Hemoglobin 10 hours ago was 9.5. Given BRBPR in the ED after tPA and drop in Hgb equivalent to 1 unit of PRBCs every 5 hours, will transfuse with 2 units of PRBC now. Patient informed consent obtained. The patient is not aware of having prior episodes of GI bleeding.   Also consulting [facility] for further recommendations. I have spoken with the Gastroenterologist on call; they will see in the AM if Hgb and BP remain stable after transfusion, sooner if there are clinical changes that warrant immediate GI evaluation.   BP improved to 102/55 following 1 L bolus, but still low. Will increase rate of IVNS to 125 cc/hr after blood transfusion if BP is still low.   Not a candidate for reversal of tPA given its relatively short half life and significant time elapsed since infusion.   Electronically signed: Dr. Kerney Elbe

## 2017-05-25 NOTE — Progress Notes (Signed)
OT Cancellation Note  Patient Details Name: KILEEN LANGE MRN: [id number] DOB: [birth year]   Cancelled Treatment:    Reason Eval/Treat Not Completed: Patient not medically ready. Pt with strict bedrest orders. Will await increase in activity orders prior to initiating OT evaluation. Thank you for this referral!  Norman Herrlich, MS OTR/L  Pager: [phone] 05/25/2017, 6:59 AM

## 2017-05-25 NOTE — Consult Note (Signed)
Reason for Consult: Hematochezia and anemia Referring Physician: Neurology  Shawna Schneider HPI: This is an 82 year old female s/p left renal cell carcinoma resection and a chronic history of anemia admitted with a code stroke.  The MRI revealed a left sided CVA and she was started on tPA.  This resulted in hematochezia and a drop in her HGB from the 10 range down to 7.5 g/dL.  She is s/p two units of PRBC.  In the past she was evaluated in the office in 03/2014 for a change in her bowel habits.  She was improved with Metamucil, but a colonoscopy was offered.  She declined at that time as she was concerned about anesthesia.  During a prior colonoscopy and her left nephrectomy she had significant issues waking up from anesthesia.   A CT scan on 12/11/2011 showed diverticula and the scan was performed for complaints of abdominal pain.  Past Medical History:  Diagnosis Date  . Hyperlipemia   . Solitary kidney     Past Surgical History:  Procedure Laterality Date  . KIDNEY SURGERY      No family history on file.  Social History:  reports that she has never smoked. She has never used smokeless tobacco. She reports that she does not drink alcohol or use drugs.  Allergies:  Allergies  Allergen Reactions  . Penicillins     Medications:  Scheduled: .  stroke: mapping our early stages of recovery book   Does not apply Once  . latanoprost  1 drop Both Eyes QHS  . pantoprazole (PROTONIX) IV  40 mg Intravenous QHS  . rosuvastatin  5 mg Oral Daily   Continuous: . sodium chloride Stopped (05/25/17 0900)    Results for orders placed or performed during the hospital encounter of 05/24/17 (from the past 24 hour(s))  Protime-INR     Status: None   Collection Time: 05/24/17  1:20 PM  Result Value Ref Range   Prothrombin Time 14.2 11.4 - 15.2 seconds   INR 1.11   APTT     Status: None   Collection Time: 05/24/17  1:20 PM  Result Value Ref Range   aPTT 33 24 - 36 seconds  CBC     Status:  Abnormal   Collection Time: 05/24/17  1:20 PM  Result Value Ref Range   WBC 8.2 4.0 - 10.5 K/uL   RBC 3.51 (L) 3.87 - 5.11 MIL/uL   Hemoglobin 9.1 (L) 12.0 - 15.0 g/dL   HCT 28.9 (L) 36.0 - 46.0 %   MCV 82.3 78.0 - 100.0 fL   MCH 25.9 (L) 26.0 - 34.0 pg   MCHC 31.5 30.0 - 36.0 g/dL   RDW 16.0 (H) 11.5 - 15.5 %   Platelets 246 150 - 400 K/uL  Differential     Status: None   Collection Time: 05/24/17  1:20 PM  Result Value Ref Range   Neutrophils Relative % 71 %   Neutro Abs 5.8 1.7 - 7.7 K/uL   Lymphocytes Relative 17 %   Lymphs Abs 1.4 0.7 - 4.0 K/uL   Monocytes Relative 9 %   Monocytes Absolute 0.8 0.1 - 1.0 K/uL   Eosinophils Relative 3 %   Eosinophils Absolute 0.2 0.0 - 0.7 K/uL   Basophils Relative 0 %   Basophils Absolute 0.0 0.0 - 0.1 K/uL  Comprehensive metabolic panel     Status: Abnormal   Collection Time: 05/24/17  1:20 PM  Result Value Ref Range   Sodium 138  135 - 145 mmol/L   Potassium 3.8 3.5 - 5.1 mmol/L   Chloride 112 (H) 101 - 111 mmol/L   CO2 16 (L) 22 - 32 mmol/L   Glucose, Bld 90 65 - 99 mg/dL   BUN 37 (H) 6 - 20 mg/dL   Creatinine, Ser 3.22 (H) 0.44 - 1.00 mg/dL   Calcium 10.5 (H) 8.9 - 10.3 mg/dL   Total Protein 7.0 6.5 - 8.1 g/dL   Albumin 3.4 (L) 3.5 - 5.0 g/dL   AST 20 15 - 41 U/L   ALT 13 (L) 14 - 54 U/L   Alkaline Phosphatase 105 38 - 126 U/L   Total Bilirubin 0.4 0.3 - 1.2 mg/dL   GFR calc non Af Amer 13 (L) >60 mL/min   GFR calc Af Amer 15 (L) >60 mL/min   Anion gap 10 5 - 15  CBG monitoring, ED     Status: None   Collection Time: 05/24/17  1:20 PM  Result Value Ref Range   Glucose-Capillary 76 65 - 99 mg/dL  I-stat troponin, ED     Status: None   Collection Time: 05/24/17  1:27 PM  Result Value Ref Range   Troponin i, poc 0.05 0.00 - 0.08 ng/mL   Comment 3          I-Stat Chem 8, ED     Status: Abnormal   Collection Time: 05/24/17  1:29 PM  Result Value Ref Range   Sodium 140 135 - 145 mmol/L   Potassium 3.9 3.5 - 5.1 mmol/L    Chloride 114 (H) 101 - 111 mmol/L   BUN 35 (H) 6 - 20 mg/dL   Creatinine, Ser 3.20 (H) 0.44 - 1.00 mg/dL   Glucose, Bld 85 65 - 99 mg/dL   Calcium, Ion 1.40 1.15 - 1.40 mmol/L   TCO2 18 (L) 22 - 32 mmol/L   Hemoglobin 9.5 (L) 12.0 - 15.0 g/dL   HCT 28.0 (L) 36.0 - 46.0 %  MRSA PCR Screening     Status: None   Collection Time: 05/24/17  8:00 PM  Result Value Ref Range   MRSA by PCR NEGATIVE NEGATIVE  Hemoglobin and hematocrit, blood     Status: Abnormal   Collection Time: 05/24/17 11:48 PM  Result Value Ref Range   Hemoglobin 7.5 (L) 12.0 - 15.0 g/dL   HCT 23.2 (L) 36.0 - 46.0 %  Type and screen [place]     Status: None (Preliminary result)   Collection Time: 05/24/17 11:50 PM  Result Value Ref Range   ABO/RH(D) O POS    Antibody Screen NEG    Sample Expiration 05/27/2017    Unit Number [id number]    Blood Component Type RED CELLS,LR    Unit division 00    Status of Unit ISSUED    Transfusion Status OK TO TRANSFUSE    Crossmatch Result Compatible    Unit Number [id number]    Blood Component Type RBC LR PHER2    Unit division 00    Status of Unit ISSUED    Transfusion Status OK TO TRANSFUSE    Crossmatch Result Compatible   ABO/Rh     Status: None   Collection Time: 05/24/17 11:50 PM  Result Value Ref Range   ABO/RH(D) O POS   Hemoglobin A1c     Status: Abnormal   Collection Time: 05/24/17 11:55 PM  Result Value Ref Range   Hgb A1c MFr Bld 6.5 (H) 4.8 - 5.6 %  Mean Plasma Glucose 139.85 mg/dL  Lipid panel     Status: Abnormal   Collection Time: 05/24/17 11:55 PM  Result Value Ref Range   Cholesterol 94 0 - 200 mg/dL   Triglycerides 89 <150 mg/dL   HDL 28 (L) >40 mg/dL   Total CHOL/HDL Ratio 3.4 RATIO   VLDL 18 0 - 40 mg/dL   LDL Cholesterol 48 0 - 99 mg/dL  Prepare RBC     Status: None   Collection Time: 05/25/17 12:55 AM  Result Value Ref Range   Order Confirmation ORDER PROCESSED BY BLOOD BANK   CBC     Status: Abnormal    Collection Time: 05/25/17 10:32 AM  Result Value Ref Range   WBC 9.5 4.0 - 10.5 K/uL   RBC 3.83 (L) 3.87 - 5.11 MIL/uL   Hemoglobin 10.3 (L) 12.0 - 15.0 g/dL   HCT 31.8 (L) 36.0 - 46.0 %   MCV 83.0 78.0 - 100.0 fL   MCH 26.9 26.0 - 34.0 pg   MCHC 32.4 30.0 - 36.0 g/dL   RDW 17.9 (H) 11.5 - 15.5 %   Platelets 212 150 - 400 K/uL     Mr Angiogram Head Wo Contrast  Result Date: 05/24/2017 CLINICAL DATA:  82 y/o  F; expressive aphasia. EXAM: MRI HEAD WITHOUT CONTRAST MRA HEAD WITHOUT CONTRAST TECHNIQUE: Multiplanar, multiecho pulse sequences of the brain and surrounding structures were obtained without intravenous contrast. Angiographic images of the head were obtained using MRA technique without contrast. COMPARISON:  05/24/2017 CT head. FINDINGS: MRI HEAD FINDINGS Brain: Small region of reduced diffusion involving the left posterior insula and extending into the left frontoparietal junction cortex compatible with acute/early subacute infarction. Several additional punctate foci of reduced diffusion are present in the bilateral frontal and parietal lobes as well as bilateral cerebellar hemispheres. In the left posterior parietal lobe there is a small region of diffusion and T2 FLAIR hyperintensity without reduced diffusion on ADC probably representing a subacute infarction. There additional foci of T2 FLAIR hyperintense signal abnormality in subcortical periventricular white matter throughout the supratentorial brain that are nonspecific but likely represent moderate chronic microvascular ischemic changes. Moderate brain parenchymal volume loss. Vascular: As below. Skull and upper cervical spine: Normal marrow signal. Sinuses/Orbits: Negative. Other: None. MRA HEAD FINDINGS Anterior circulation: Right cavernous ICA laterally directed 2 mm sacculation. No additional finding for occlusion, aneurysm, or significant stenosis in bilateral ICA, bilateral ACA, and right MCA. Patent left M1 and proximal M2  branches. Poor flow related signal within left M2 inferior division (series 4, image 105 and series 406, image 5). Posterior circulation: No large vessel occlusion, aneurysm, or significant stenosis is identified. Anatomic variation: Patent anterior communicating artery. Bilateral fetal PCA. IMPRESSION: MRI head: 1. Small region of acute/early subacute infarction within the left posterior insula extending into left frontoparietal junction. 2. Additional scattered punctate foci of reduced diffusion are present in the bilateral convexities and cerebellar hemispheres. Multiple vascular territories suggests embolic phenomenon. 3. Late subacute to chronic infarct within left occipital lobe. 4. Moderate chronic microvascular ischemic changes and moderate parenchymal volume loss of the brain. MRA head: 1. Poor flow related signal within left MCA M2 inferior sylvian division likely representing thromboembolic occlusion. 2. Otherwise patent circle of Willis. No aneurysm or high-grade stenosis. These results will be called to the ordering clinician or representative by the Radiologist Assistant, and communication documented in the PACS or zVision Dashboard. Electronically Signed   By: Kristine Garbe M.D.   On: 05/24/2017 15:28  Mr Brain 26 Contrast  Result Date: 05/24/2017 CLINICAL DATA:  82 y/o  F; expressive aphasia. EXAM: MRI HEAD WITHOUT CONTRAST MRA HEAD WITHOUT CONTRAST TECHNIQUE: Multiplanar, multiecho pulse sequences of the brain and surrounding structures were obtained without intravenous contrast. Angiographic images of the head were obtained using MRA technique without contrast. COMPARISON:  05/24/2017 CT head. FINDINGS: MRI HEAD FINDINGS Brain: Small region of reduced diffusion involving the left posterior insula and extending into the left frontoparietal junction cortex compatible with acute/early subacute infarction. Several additional punctate foci of reduced diffusion are present in the  bilateral frontal and parietal lobes as well as bilateral cerebellar hemispheres. In the left posterior parietal lobe there is a small region of diffusion and T2 FLAIR hyperintensity without reduced diffusion on ADC probably representing a subacute infarction. There additional foci of T2 FLAIR hyperintense signal abnormality in subcortical periventricular white matter throughout the supratentorial brain that are nonspecific but likely represent moderate chronic microvascular ischemic changes. Moderate brain parenchymal volume loss. Vascular: As below. Skull and upper cervical spine: Normal marrow signal. Sinuses/Orbits: Negative. Other: None. MRA HEAD FINDINGS Anterior circulation: Right cavernous ICA laterally directed 2 mm sacculation. No additional finding for occlusion, aneurysm, or significant stenosis in bilateral ICA, bilateral ACA, and right MCA. Patent left M1 and proximal M2 branches. Poor flow related signal within left M2 inferior division (series 4, image 105 and series 406, image 5). Posterior circulation: No large vessel occlusion, aneurysm, or significant stenosis is identified. Anatomic variation: Patent anterior communicating artery. Bilateral fetal PCA. IMPRESSION: MRI head: 1. Small region of acute/early subacute infarction within the left posterior insula extending into left frontoparietal junction. 2. Additional scattered punctate foci of reduced diffusion are present in the bilateral convexities and cerebellar hemispheres. Multiple vascular territories suggests embolic phenomenon. 3. Late subacute to chronic infarct within left occipital lobe. 4. Moderate chronic microvascular ischemic changes and moderate parenchymal volume loss of the brain. MRA head: 1. Poor flow related signal within left MCA M2 inferior sylvian division likely representing thromboembolic occlusion. 2. Otherwise patent circle of Willis. No aneurysm or high-grade stenosis. These results will be called to the ordering  clinician or representative by the Radiologist Assistant, and communication documented in the PACS or zVision Dashboard. Electronically Signed   By: Kristine Garbe M.D.   On: 05/24/2017 15:28   Ct Head Code Stroke Wo Contrast`  Result Date: 05/24/2017 CLINICAL DATA:  Code stroke.  Aphasia and confusion. EXAM: CT HEAD WITHOUT CONTRAST TECHNIQUE: Contiguous axial images were obtained from the base of the skull through the vertex without intravenous contrast. COMPARISON:  None. FINDINGS: Brain: Moderate diffuse white matter changes are present. No acute or focal cortical abnormality is present. The basal ganglia are intact. The insular ribbon is normal bilaterally. The brainstem and cerebellum are normal. Vascular: Atherosclerotic calcifications are present within the cavernous internal carotid artery is bilaterally. There is no hyperdense vessel. Skull: The calvarium is intact. No focal lytic or blastic lesions are present. Sinuses/Orbits: The paranasal sinuses and mastoid air cells are clear. Bilateral lens replacements are present. The globes and orbits are within normal limits bilaterally. ASPECTS [facility] Stroke Program Early CT Score) - Ganglionic level infarction (caudate, lentiform nuclei, internal capsule, insula, M1-M3 cortex): 7/7 - Supraganglionic infarction (M4-M6 cortex): 3/3 Total score (0-10 with 10 being normal): 10/10 IMPRESSION: 1. No acute intracranial abnormality. 2. ASPECTS is 10/10 These results were called by telephone at the time of interpretation on 05/24/2017 at 1:53 pm to Dr. Rory Percy, who verbally acknowledged these  results. Electronically Signed   By: San Morelle M.D.   On: 05/24/2017 13:53    ROS:  As stated above in the HPI otherwise negative.  Blood pressure 104/63, pulse 91, temperature 99.2 F (37.3 C), temperature source Oral, resp. rate (!) 24, height 5\' 3"  (1.6 m), weight 82.8 kg (182 lb 8.7 oz), SpO2 98 %.    PE: Not performed.  Assessment/Plan: 1)  Hematochezia. 2) Anemia. 3) CVA.   Upon my arrival the patient stated that she knew who I was when I introduced myself.  She was upset with seeing me as she recalled that our last visit in my office I was discussing issues about her life expectancy, however, my notes, which were detailed, did not reflect any of those issues.  In fact, my desire was to pursue a colonoscopy as she had a change in her bowel habits.  She had one in the distant past by another physician.  She was the one who declined further work up as a result of her concerns with anesthesia and and I did not press the point.  I may have discussed life expectancy issues in relation to the pros and cons of a colonoscopy at the age of 45, when I last saw her.  Regardless, this is not a positive relationship and Dr. Ardis Hughs has kindly agreed to evaluate and manage the patient's GI issues.   Plan: 1) Further work up per Dr. Ardis Hughs.  Taneasha Fuqua D 05/25/2017, 12:15 PM

## 2017-05-25 NOTE — Consult Note (Signed)
Consultation  Referring Provider: Neuro  Hospitalist/ Dr Rory Percy Primary Care Physician:  [name], MD Primary Gastroenterologist:  none  Reason for Consultation:  Rectal bleeding and anemia  HPI: Shawna Schneider is a 82 y.o. female , previously known to Dr. Benson Norway who was admitted last evening through the emergency room after being seen at her PCPs office with vague symptoms of not feeling well, and change in speech. Apparently initially she had fluent speech and then made up words. Initial CT of the head was negative. MRI was then done showing scattered left MCA and cerebellar strokes and patient was given TPA. After being given TPA she started having bright red blood in her stools, and was noted to be mildly hypotensive.  On arrival to the emergency room hemoglobin was 9.1 hematocrit of 28.9 and creatinine 3. Overnight hemoglobin dropped to 7.5. She was given 2 units of blood and most recent hemoglobin up to 10.3 today. Patient says she had 2 episodes of bright red blood last night, she has not had any further bleeding today. She says she has had some mild upper abdominal tenderness off and on for a long time. She had not been noticing any hematochezia or melena recently at home. She has had somewhat loose stools once or twice daily over the past few years. Appetite is been fine and weight has been stable. She feels much better today, speech is normal, she does have a dull headache. Patient had apparently been seen by Dr. Benson Norway in his office in 2015 after change in bowel habits. Colonoscopy was suggested,patient decided against proceeding . Review of record showed that she did have colonoscopy and EGD with Dr. Lajoyce Corners in September 2002. EGD revealed shallow duodenal ulcerations and gastritis. Biopsy showed chronic gastritis with H. pylori. Colonoscopy done at that same setting with 2 cecal AVMs, not treated and pandiverticulosis worse on the left. Also had internal hemorrhoids and one small polyp  removed which was hyperplastic. She had a barium enema in 2006 per Dr. Lajoyce Corners showing extensive diverticulosis. I can tell that she had capsule endoscopy at some point around 2006 but cannot see that report or another record states this was negative. Patient says she was given a Colguard by Dr. Shelia Media within the past year or so but did not complete it because her stools were loose.  Patient has history of renal cell CA status post nephrectomy, and chronic kidney disease as well as hyperlipidemia.  Past Medical History:  Diagnosis Date  . Hyperlipemia   . Solitary kidney     Past Surgical History:  Procedure Laterality Date  . KIDNEY SURGERY      Prior to Admission medications   Medication Sig Start Date End Date Taking? Authorizing Provider  allopurinol (ZYLOPRIM) 100 MG tablet Take 100 mg by mouth daily. 02/25/17  Yes [provider]  diltiazem (CARTIA XT) 180 MG 24 hr capsule Take 180 mg by mouth daily.   Yes [provider]  levothyroxine (SYNTHROID, LEVOTHROID) 75 MCG tablet Take 75 mcg by mouth daily. 04/16/17  Yes [provider]  prednisoLONE acetate (PRED FORTE) 1 % ophthalmic suspension Place 1 drop into the left eye daily as needed.  03/19/17  Yes [provider]  rosuvastatin (CRESTOR) 20 MG tablet Take 5 mg by mouth daily.    Yes [provider]  valsartan-hydrochlorothiazide (DIOVAN-HCT) 320-12.5 MG tablet Take 1 tablet by mouth daily. 04/16/17  Yes [provider]    Current Facility-Administered Medications  Medication  Dose Route Frequency Provider Last Rate Last Dose  .  stroke: mapping our early stages of recovery book   Does not apply Once Estill Bakes   Stopped at 05/24/17 1604  . 0.9 %  sodium chloride infusion   Intravenous Continuous Marliss Coots, PA-C   Stopped at 05/25/17 0900  . acetaminophen (TYLENOL) tablet 650 mg  650 mg Oral Q4H PRN Marliss Coots, PA-C       Or  . acetaminophen (TYLENOL) solution 650 mg   650 mg Per Tube Q4H PRN Marliss Coots, PA-C       Or  . acetaminophen (TYLENOL) suppository 650 mg  650 mg Rectal Q4H PRN Marliss Coots, PA-C      . latanoprost (XALATAN) 0.005 % ophthalmic solution 1 drop  1 drop Both Eyes QHS Rice, Resa Miner, MD      . pantoprazole (PROTONIX) injection 40 mg  40 mg Intravenous QHS Marliss Coots, PA-C   40 mg at 05/24/17 2259  . rosuvastatin (CRESTOR) tablet 5 mg  5 mg Oral Daily Marliss Coots, PA-C   5 mg at 05/25/17 1032  . senna-docusate (Senokot-S) tablet 1 tablet  1 tablet Oral QHS PRN Marliss Coots, PA-C        Allergies as of 05/24/2017 - Review Complete 05/24/2017  Allergen Reaction Noted  . Penicillins  05/06/2012    No family history on file.  Social History   Social History  . Marital status: Divorced    Spouse name: N/A  . Number of children: N/A  . Years of education: N/A   Occupational History  . Not on file.   Social History Main Topics  . Smoking status: Never Smoker  . Smokeless tobacco: Never Used  . Alcohol use No  . Drug use: No  . Sexual activity: Not on file   Other Topics Concern  . Not on file   Social History Narrative  . No narrative on file    Review of Systems: Pertinent positive and negative review of systems were noted in the above HPI section.  All other review of systems was otherwise negative.  Physical Exam: Vital signs in last 24 hours: Temp:  [97.5 F (36.4 C)-99.2 F (37.3 C)] 98.6 F (37 C) (10/16 1200) Pulse Rate:  [69-98] 91 (10/16 1200) Resp:  [11-31] 24 (10/16 1200) BP: (81-139)/(46-94) 131/77 (10/16 1200) SpO2:  [93 %-100 %] 98 % (10/16 1200) Weight:  [182 lb 8.7 oz (82.8 kg)] 182 lb 8.7 oz (82.8 kg) (10/15 2000) Last BM Date: 05/24/17 General:   Alert,  Well-developed, well-nourished, elderly African-American female pleasant and cooperative in NAD Head:  Normocephalic and atraumatic. Eyes:  Sclera clear, no icterus.   Conjunctiva pink. Ears:  Normal auditory  acuity. Nose:  No deformity, discharge,  or lesions. Mouth:  No deformity or lesions.   Neck:  Supple; no masses or thyromegaly. Lungs:  Clear throughout to auscultation.   No wheezes, crackles, or rhonchi. Heart:  Regular rate and rhythm; no murmurs, clicks, rubs,  or gallops. Abdomen:  Soft,nontender, BS active,nonpalp mass or hsm.   Rectal:  Deferred  Msk:  Symmetrical without gross deformities. . Pulses:  Normal pulses noted. Extremities:  Without clubbing or edema. Neurologic:  Alert and  oriented x4;  grossly normal neurologically. Skin:  Intact without significant lesions or rashes.. Psych:  Alert and cooperative. Normal mood and affect.  Intake/Output from previous day: 10/15 0701 - 10/16 0700 In: 2371.3 [I.V.:1636.3;  Blood:735] Out: 450 [Urine:450] Intake/Output this shift: Total I/O In: 390 [P.O.:240; I.V.:150] Out: 75 [Urine:75]  Lab Results:  Recent Labs  05/24/17 1320 05/24/17 1329 05/24/17 2348 05/25/17 1032  WBC 8.2  --   --  9.5  HGB 9.1* 9.5* 7.5* 10.3*  HCT 28.9* 28.0* 23.2* 31.8*  PLT 246  --   --  212   BMET  Recent Labs  05/24/17 1320 05/24/17 1329  NA 138 140  K 3.8 3.9  CL 112* 114*  CO2 16*  --   GLUCOSE 90 85  BUN 37* 35*  CREATININE 3.22* 3.20*  CALCIUM 10.5*  --    LFT  Recent Labs  05/24/17 1320  PROT 7.0  ALBUMIN 3.4*  AST 20  ALT 13*  ALKPHOS 105  BILITOT 0.4   PT/INR  Recent Labs  05/24/17 1320  LABPROT 14.2  INR 1.11       IMPRESSION:  #56 82 year old African-American female, admitted yesterday with acute dysarthria and found to have scattered left middle cerebral artery and cerebellar strokes. Patient was treated with TPA-she has recovered, with normal speech today-follow-up CT pending #2 acute hematochezia/lower GI bleeding immediately post TPA-self-limited but with 1.5 g drop in hemoglobin. Patient has known extensive diverticulosis and also has had previously documented cecal AVMs. She may have bled from  either. #3 previous history of DVT with Coumadin use #4 history of renal cell CA #5 history of H. pylori gastropathy #6 normocytic anemia #7 chronic kidney disease stage III  Plan;Serial hemoglobins, and transfuse for hemoglobin less than 8 As patient is not actively bleeding and would prefer to avoid endoscopic procedures and sedation in patient immediately post-CVA.   Thank you, we will follow with you   [name] Esterwood  05/25/2017, 1:37 PM  ________________________________________________________________________  Velora Heckler GI MD note:  I personally examined the patient, reviewed the data and agree with the assessment and plan described above.  I generally prefer 3 months after CVA before proceeding with sedation for colonoscopy/EGD but I understand the important question here about anticoagulation start. Shawna Schneider is very reluctant to proceed. She would like to discuss with neurology in the morning. I await the outcome of that discussion.  If she continues to decline the procedure, would recommend starting an easily reversible blood thinner such as coumadin and then revisiting the possibility of colonoscopy/egd after a few months recovery from the (aborted) CVA.    Owens Loffler, MD [facility] Gastroenter[facility] Pager [phone]

## 2017-05-25 NOTE — Evaluation (Signed)
Speech Language Pathology Evaluation Patient Details Name: JASMANE BROCKWAY MRN: [id number] DOB: Jun 24, 1935 Today's Date: 05/25/2017 Time: [id number] SLP Time Calculation (min) (ACUTE ONLY): 20 min  Problem List:  Patient Active Problem List   Diagnosis Date Noted  . CVA (cerebral vascular accident) ([place]) 05/24/2017   Past Medical History:  Past Medical History:  Diagnosis Date  . Hyperlipemia   . Solitary kidney    Past Surgical History:  Past Surgical History:  Procedure Laterality Date  . KIDNEY SURGERY     HPI:  82 year old female presenting with left MCA stroke, status post tPA with developing  hypotensive and lower GIB.   Assessment / Plan / Recommendation Clinical Impression  Cognitive-linguistic function WFL for all areas assessed. No further SLP needs indicated.     SLP Assessment  SLP Recommendation/Assessment: Patient does not need any further Speech Lanaguage Pathology Services SLP Visit Diagnosis: Aphasia (R47.01)    Follow Up Recommendations  None          SLP Evaluation Cognition  Overall Cognitive Status: Within Functional Limits for tasks assessed Orientation Level: Oriented X4       Comprehension  Auditory Comprehension Overall Auditory Comprehension: Appears within functional limits for tasks assessed Visual Recognition/Discrimination Discrimination: Within Function Limits Reading Comprehension Reading Status: Within funtional limits    Expression Expression Primary Mode of Expression: Verbal Verbal Expression Overall Verbal Expression: Appears within functional limits for tasks assessed   Oral / Motor  Oral Motor/Sensory Function Overall Oral Motor/Sensory Function: Within functional limits Motor Speech Overall Motor Speech: Appears within functional limits for tasks assessed   Gabriel Rainwater MA, CCC-SLP [phone]                     Shawnia Vizcarrondo Meryl 05/25/2017, 4:09 PM

## 2017-05-25 NOTE — Progress Notes (Signed)
MD notified of hypotension. Patient with no neurological changes and asymptomatic. Order placed for hemoglobin and hematocrit and type and screen as directed by MD. Patient also receiving 1 liter NS bolus as directed. Will continue to monitor.

## 2017-05-25 NOTE — Progress Notes (Signed)
PT Cancellation Note  Patient Details Name: Shawna Schneider MRN: [id number] DOB: 1935/01/28   Cancelled Treatment:    Reason Eval/Treat Not Completed: Patient not medically ready. Pt currently on strict bedrest. Will await increased activity orders prior to initiating PT eval.    Thelma Comp 05/25/2017, 7:00 AM   Rolinda Roan, PT, DPT Acute Rehabilitation Services Pager: [phone]

## 2017-05-25 NOTE — Progress Notes (Signed)
*  PRELIMINARY RESULTS* Vascular Ultrasound Carotid Duplex (Doppler) : Bilateral: No significant (1-39%) ICA stenosis. Antegrade vertebral flow.   Lower ext venous duplex: DVT noted in the Right popliteal and posterior tibial veins and in the Left posterior tibial veins, as well as intramuscular thrombosis of the Right gastroc veins.   Gave results to [place], [place], [place], RVT  05/25/2017, 12:13 PM

## 2017-05-26 ENCOUNTER — Inpatient Hospital Stay (HOSPITAL_COMMUNITY): Payer: BC Managed Care – PPO

## 2017-05-26 DIAGNOSIS — I5030 Unspecified diastolic (congestive) heart failure: Secondary | ICD-10-CM

## 2017-05-26 DIAGNOSIS — I63412 Cerebral infarction due to embolism of left middle cerebral artery: Secondary | ICD-10-CM

## 2017-05-26 LAB — BPAM RBC
BLOOD PRODUCT EXPIRATION DATE: 201811092359
Blood Product Expiration Date: 201811092359
ISSUE DATE / TIME: 201810160113
ISSUE DATE / TIME: 201810160315
UNIT TYPE AND RH: 5100
Unit Type and Rh: 5100

## 2017-05-26 LAB — BASIC METABOLIC PANEL
ANION GAP: 7 (ref 5–15)
BUN: 35 mg/dL — ABNORMAL HIGH (ref 6–20)
CALCIUM: 9.6 mg/dL (ref 8.9–10.3)
CO2: 16 mmol/L — AB (ref 22–32)
Chloride: 114 mmol/L — ABNORMAL HIGH (ref 101–111)
Creatinine, Ser: 2.67 mg/dL — ABNORMAL HIGH (ref 0.44–1.00)
GFR, EST AFRICAN AMERICAN: 18 mL/min — AB (ref >60)
GFR, EST NON AFRICAN AMERICAN: 16 mL/min — AB (ref >60)
Glucose, Bld: 100 mg/dL — ABNORMAL HIGH (ref 65–99)
Potassium: 4.1 mmol/L (ref 3.5–5.1)
SODIUM: 137 mmol/L (ref 135–145)

## 2017-05-26 LAB — TYPE AND SCREEN
ABO/RH(D): O POS
ANTIBODY SCREEN: NEGATIVE
Unit division: 0
Unit division: 0

## 2017-05-26 LAB — ECHOCARDIOGRAM COMPLETE
Height: 63 in
Weight: 2892.44 oz

## 2017-05-26 LAB — CBC
HEMATOCRIT: 31.1 % — AB (ref 36.0–46.0)
Hemoglobin: 10 g/dL — ABNORMAL LOW (ref 12.0–15.0)
MCH: 26.5 pg (ref 26.0–34.0)
MCHC: 32.2 g/dL (ref 30.0–36.0)
MCV: 82.3 fL (ref 78.0–100.0)
Platelets: 218 10*3/uL (ref 150–400)
RBC: 3.78 MIL/uL — ABNORMAL LOW (ref 3.87–5.11)
RDW: 17.5 % — AB (ref 11.5–15.5)
WBC: 9.2 10*3/uL (ref 4.0–10.5)

## 2017-05-26 LAB — GLUCOSE, CAPILLARY
GLUCOSE-CAPILLARY: 106 mg/dL — AB (ref 65–99)
GLUCOSE-CAPILLARY: 91 mg/dL (ref 65–99)

## 2017-05-26 MED ORDER — SENNA 8.6 MG PO TABS
1.0000 | ORAL_TABLET | Freq: Every day | ORAL | Status: AC
  Administered 2017-05-26: 8.6 mg via ORAL
  Filled 2017-05-26: qty 1

## 2017-05-26 NOTE — Progress Notes (Signed)
TCD with bubbles completed.   Verbal consent taken. Right MCA insonated. Left forearm IV used. Dr. Leonie Man performed.   HITS heard at rest and during Valsalva.  Small PFO.  Landry Mellow, RDMS, RVT 05/26/2017

## 2017-05-26 NOTE — Progress Notes (Signed)
  Echocardiogram 2D Echocardiogram has been performed.  Shawna Schneider 05/26/2017, 11:59 AM

## 2017-05-26 NOTE — Progress Notes (Signed)
STROKE TEAM PROGRESS NOTE   HISTORY OF PRESENT ILLNESS (per record) Shawna Schneider is an 82 y.o. female who is brought from her PCP office to the emergency room due to the fact that she "was not feeling good" per EMS/ per PCP. Upon arrival to the ED patient was not speaking very much however when she did speak she initially started with clear fluent speech and then had made up words. Patient was able to follow commands but not do so with full effort. Patient CT was negative. Upon entering the room and second assessment was done, when asking patient to tell me her name and where she was she did not speak, when asked to move her extremities she would lift them however one hand was placed overhead the hand did not hit her head was diverted. When noxious stimuli was administered patient quickly was able to speak very clearly with no aphasia and no dysarthria. Initially she was not able to move her extremities however when noxious stimuli was given she had 5 out of 5 strength throughout. At a later point, the family arrived and said that she was probably on some blood thinners for irregular heart rhythm. Currently not on any blood thinners. I could not find any records in the electronic medical record of her having a.fibrillation. Also c/o LE pain. 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  Due to initial exam being inconsistent-tPA was not given and MRI ordered.  MRI showed scattered left MCA and cerebellar strokes. tPA was given and she was admitted to Neuro ICU for observation.  Date last known well: Date: 05/24/2017 Time last known well: Time: 1100 hrs tPA Given: yes -stroke on MRI with aphasia NIHSS 2 (one for aphasia and one for dysarthria) Modified Rankin: Rankin Score=0   SUBJECTIVE (INTERVAL HISTORY) She feels nearly or completely improved today with resolution of her dysarthria  No more LGI bleeding. She has met with GI Dr Ardis Hughs but is yet undecided on colonoscopy and has concerns about drinking fluids  needed for preparation OBJECTIVE CBC:  Recent Labs Lab 05/24/17 1320  05/25/17 1032 05/26/17 0459  WBC 8.2  --  9.5 9.2  NEUTROABS 5.8  --   --   --   HGB 9.1*  < > 10.3* 10.0*  HCT 28.9*  < > 31.8* 31.1*  MCV 82.3  --  83.0 82.3  PLT 246  --  212 218  < > = values in this interval not displayed.  Basic Metabolic Panel:   Recent Labs Lab 05/24/17 1320 05/24/17 1329 05/26/17 0459  NA 138 140 137  K 3.8 3.9 4.1  CL 112* 114* 114*  CO2 16*  --  16*  GLUCOSE 90 85 100*  BUN 37* 35* 35*  CREATININE 3.22* 3.20* 2.67*  CALCIUM 10.5*  --  9.6    Lipid Panel:     Component Value Date/Time   CHOL 94 05/24/2017 2355   TRIG 89 05/24/2017 2355   HDL 28 (L) 05/24/2017 2355   CHOLHDL 3.4 05/24/2017 2355   VLDL 18 05/24/2017 2355   LDLCALC 48 05/24/2017 2355   HgbA1c:  Lab Results  Component Value Date   HGBA1C 6.5 (H) 05/24/2017   Urine Drug Screen: No results found for: LABOPIA, COCAINSCRNUR, LABBENZ, AMPHETMU, THCU, LABBARB  Alcohol Level No results found for: [facility]  IMAGING  Mr Brain Wo Contrast 05/24/2017 1. Small region of acute/early subacute infarction within the left posterior insula extending into left frontoparietal junction. 2. Additional scattered  punctate foci of reduced diffusion are present in the bilateral convexities and cerebellar hemispheres. Multiple vascular territories suggests embolic phenomenon. 3. Late subacute to chronic infarct within left occipital lobe. 4. Moderate chronic microvascular ischemic changes and moderate parenchymal volume loss of the brain.   Mr Angiogram Head Wo Contrast 05/24/2017 1. Poor flow related signal within left MCA M2 inferior sylvian division likely representing thromboembolic occlusion. 2. Otherwise patent circle of Willis. No aneurysm or high-grade stenosis.  Ct Head Code Stroke Wo Contrast` 05/24/2017 IMPRESSION: 1. No acute intracranial abnormality. 2. ASPECTS is 10/10   PHYSICAL EXAM  Temp:  [98.2 F  (36.8 C)-98.8 F (37.1 C)] 98.5 F (36.9 C) (10/17 1421) Pulse Rate:  [72-167] 92 (10/17 1421) Resp:  [18-31] 18 (10/17 1421) BP: (100-128)/(56-75) 109/71 (10/17 1421) SpO2:  [96 %-100 %] 99 % (10/17 1421) Weight:  [180 lb 12.4 oz (82 kg)] 180 lb 12.4 oz (82 kg) (10/16 2147)  General - Well nourished, well developedelderly lady, in no apparent distress.  Cardiovascular - Regular rate and rhythm.  Mental Status -  Level of arousal and orientation to time, place, and person were intact. Language including expression, naming, repetition, comprehension was intact. Attention span and concentration were normal. Recent and remote memory were intact.  Cranial Nerves II - XII - II - Visual field intact OU. III, IV, VI - Extraocular movements intact. V - Facial sensation intact bilaterally. VII - Facial movement intact bilaterally. VIII - Hearing & vestibular intact bilaterally. X - Palate elevates symmetrically. XI - Chin turning & shoulder shrug intact bilaterally. XII - Tongue protrusion intact.  Motor Strength - The patient's strength was normal in all extremities and pronator drift was absent.   Motor Tone - Muscle tone was assessed at the neck and appendages and was normal.  Reflexes - The patient's reflexes were 1+ in all extremities and she had no pathological reflexes.  Sensory - Light touch was assessed and was symmetrical.    Coordination - The patient had normal movements in the hands and feet with no ataxia or dysmetria.  Tremor was absent.  Gait and Station - Not observed   ASSESSMENT/PLAN Ms. Shawna Schneider is a 82 y.o. female with history of hypertension, hyperlipidemia, diverticulosis, remote nephrectomy presenting with new onset dysarthria and expressive aphasia. She received tPA on 10/15 _0  1511.   Stroke:  Left posterior insula infarct, bilateral punctate diffusion restriction in cerebellar hemispheres consistent with embolic phenomenon from central  source  Resultant  minimal  CT head No acute abnormality  MRI head Small region of acute/early subacute infarction within the left posterior insula extending into left frontoparietal junction, scattered punctate foci of reduced diffusion in the bilateral convexities and cerebellar hemispheres.  MRA head  Poor flow related signal within left MCA M2 inferior sylvian division likely representing thromboembolic occlusion  Carotid Doppler  Prelim read no significant stenosis 2D Echo  Left ventricle: The cavity size was normal. Wall thickness was   normal. Systolic function was normal. The estimated ejection   fraction was in the range of 55% to 60%. Wall motion was normal;    there were no regional wall motion abnormalities  LEDVT Study Findings consistent with acute deep vein thrombosis involving the right popliteal vein, right posterial tibial vein, and left posterial tibial vein. - As well as intramuscular thrombosis of the right gastrocnemius veins.LDL 48  HgbA1c 6.5%  SCDs for VTE prophylaxis, can resume enoxaparin tomorrow Diet heart healthy/carb modified Room service appropriate? Yes; Fluid  consistency: Thin  No antithrombotic prior to admission, now on No antithrombotic  Patient counseled to be compliant with her antithrombotic medications  Ongoing aggressive stroke risk factor management  Therapy recommendations:  Pending  Disposition:  Pending  Hypertension  Stable  Tight control 2/2 tPA administration  Long-term BP goal normotensive  Hyperlipidemia  Home meds: gemfibrozil  LDL 48, goal < 70  Added Rosuvastatin 60m  Continue statin at discharge  Diabetes  HgbA1c 6.5%, goal < 7.0  Controlled  Other Stroke Risk Factors  Advanced age  Obesity, Body mass index is 32.02 kg/m., recommend weight loss, diet and exercise as appropriate   Other Active Problems  Lower GI bleeding after tPA administration, Hx of diveritculosis Hgb 7.5->10.3 after 2  units pRBCs  Suspect paroxysmal atrial fibrillation given self reported history of arrhythmia, not correlated by records so far. Embolic pattern of stroke would be consistent with cardiac source or paradoxical embolism. Benefit of workup would only be if appropriate and willing for long term anticoagulation.  Other Plans  Discontinue bed rest and start therapy  Observing in Neuro ICU till today PM or tomorrow AM per room availability  GI consultation is appreciated for bleeding  Hospital day # 2  I have personally examined this patient, reviewed notes, independently viewed imaging studies, participated in medical decision making and plan of care.ROS completed by me personally and pertinent positives fully documented  I have made any additions or clarifications directly to the above note.  She presented with expressive aphasia secondary to left MCA branch infarct from atrial fibrillation and MRI shows multiple cerebral infarcts. She developed post tPA and lower GI hemorrhage likely from underlying diverticulosis. She has been transferred to units of blood.  . GI consult is pending. May need to consider safety of long-term anticoagulation for A. Fib versus risk for GI bleed from diverticulosis before making final decision prior to discharge. D/w Dr JArdis Hughspatient needs to decide on colonoscopy versus starting anticoagulation and risking LGI hemorrhage as DVT puts her at high risk for PE.    PAntony Contras MD Medical Director [facility] Pager: [phone]/17/2018 2:59 PM   s  To contact Stroke Continuity provider, please refer to [email or web address] After hours, contact General Neurology

## 2017-05-26 NOTE — Progress Notes (Signed)
Pt got transferred from 4N ICU, alert and oriented, denies any pain at this time, pt settled in bed with call light within reach, tele monitor put and verified on pt, was however reassured, will continue to monitor. [place], Dinh Ayotte Efe

## 2017-05-26 NOTE — Progress Notes (Signed)
Daily Rounding Note  05/26/2017, 9:17 AM  LOS: 2 days   SUBJECTIVE:   2 brown stools yesterday, no stool or bleeding this AM.  No abdominal pain.  Tolerating solid diet      OBJECTIVE:         Vital signs in last 24 hours:    Temp:  [98.2 F (36.8 C)-98.6 F (37 C)] 98.5 F (36.9 C) (10/17 0508) Pulse Rate:  [72-167] 78 (10/17 0508) Resp:  [18-31] 18 (10/17 0508) BP: (100-131)/(56-79) 100/60 (10/17 0508) SpO2:  [96 %-100 %] 98 % (10/17 0508) Weight:  [82 kg (180 lb 12.4 oz)] 82 kg (180 lb 12.4 oz) (10/16 2147) Last BM Date: 05/25/17 Filed Weights   05/24/17 2000 05/25/17 2147  Weight: 82.8 kg (182 lb 8.7 oz) 82 kg (180 lb 12.4 oz)   General: alert, comfortable.     Heart: RRR Chest: clear bil.   Abdomen: soft, NT, ND.  Active BS  Extremities: no CCE Neuro/Psych:  Alert, oriented x 3.  Did not test limb movement or strength, no tremors.   Intake/Output from previous day: 10/16 0701 - 10/17 0700 In: 390 [P.O.:240; I.V.:150] Out: 75 [Urine:75]  Intake/Output this shift: Total I/O In: 240 [P.O.:240] Out: -   Lab Results:  Recent Labs  05/24/17 1320  05/24/17 2348 05/25/17 1032 05/26/17 0459  WBC 8.2  --   --  9.5 9.2  HGB 9.1*  < > 7.5* 10.3* 10.0*  HCT 28.9*  < > 23.2* 31.8* 31.1*  PLT 246  --   --  212 218  < > = values in this interval not displayed. BMET  Recent Labs  05/24/17 1320 05/24/17 1329 05/26/17 0459  NA 138 140 137  K 3.8 3.9 4.1  CL 112* 114* 114*  CO2 16*  --  16*  GLUCOSE 90 85 100*  BUN 37* 35* 35*  CREATININE 3.22* 3.20* 2.67*  CALCIUM 10.5*  --  9.6   LFT  Recent Labs  05/24/17 1320  PROT 7.0  ALBUMIN 3.4*  AST 20  ALT 13*  ALKPHOS 105  BILITOT 0.4   PT/INR  Recent Labs  05/24/17 1320  LABPROT 14.2  INR 1.11   Hepatitis Panel No results for input(s): HEPBSAG, HCVAB, HEPAIGM, HEPBIGM in the last 72 hours.  Studies/Results: Ct Head Wo  Contrast  Result Date: 05/25/2017 CLINICAL DATA:  Continued surveillance of stroke. Expressive aphasia. Post tPA. Patient developed lower GI bleed. EXAM: CT HEAD WITHOUT CONTRAST TECHNIQUE: Contiguous axial images were obtained from the base of the skull through the vertex without intravenous contrast. COMPARISON:  CT head 05/24/2017.  MR brain 05/24/2017. FINDINGS: Brain: Mild atrophy is redemonstrated. Early cytotoxic edema associated with the small acute infarcts in the LEFT cerebellum, now visible on CT. Areas of low level restricted diffusion in the insula, posterior frontal cortex, and subcortical white matter less well visualized. No hemorrhagic transformation. Stable atrophy with small vessel disease. Vascular: Calcification of the cavernous internal carotid arteries consistent with cerebrovascular atherosclerotic disease. No signs of intracranial large vessel occlusion. Skull: Normal. Negative for fracture or focal lesion. Sinuses/Orbits: No acute finding. Other: None. IMPRESSION: Developing cytotoxic edema associated with small acute infarcts LEFT cerebellum. Similar areas of cytotoxic edema in the LEFT cerebral hemisphere not well visualized. No areas of concern for post tPA hemorrhage. Electronically Signed   By: Staci Righter M.D.   On: 05/25/2017 15:12   Mr Angiogram Head Wo Contrast  Result Date: 05/24/2017 CLINICAL DATA:  82 y/o  F; expressive aphasia. EXAM: MRI HEAD WITHOUT CONTRAST MRA HEAD WITHOUT CONTRAST TECHNIQUE: Multiplanar, multiecho pulse sequences of the brain and surrounding structures were obtained without intravenous contrast. Angiographic images of the head were obtained using MRA technique without contrast. COMPARISON:  05/24/2017 CT head. FINDINGS: MRI HEAD FINDINGS Brain: Small region of reduced diffusion involving the left posterior insula and extending into the left frontoparietal junction cortex compatible with acute/early subacute infarction. Several additional punctate  foci of reduced diffusion are present in the bilateral frontal and parietal lobes as well as bilateral cerebellar hemispheres. In the left posterior parietal lobe there is a small region of diffusion and T2 FLAIR hyperintensity without reduced diffusion on ADC probably representing a subacute infarction. There additional foci of T2 FLAIR hyperintense signal abnormality in subcortical periventricular white matter throughout the supratentorial brain that are nonspecific but likely represent moderate chronic microvascular ischemic changes. Moderate brain parenchymal volume loss. Vascular: As below. Skull and upper cervical spine: Normal marrow signal. Sinuses/Orbits: Negative. Other: None. MRA HEAD FINDINGS Anterior circulation: Right cavernous ICA laterally directed 2 mm sacculation. No additional finding for occlusion, aneurysm, or significant stenosis in bilateral ICA, bilateral ACA, and right MCA. Patent left M1 and proximal M2 branches. Poor flow related signal within left M2 inferior division (series 4, image 105 and series 406, image 5). Posterior circulation: No large vessel occlusion, aneurysm, or significant stenosis is identified. Anatomic variation: Patent anterior communicating artery. Bilateral fetal PCA. IMPRESSION: MRI head: 1. Small region of acute/early subacute infarction within the left posterior insula extending into left frontoparietal junction. 2. Additional scattered punctate foci of reduced diffusion are present in the bilateral convexities and cerebellar hemispheres. Multiple vascular territories suggests embolic phenomenon. 3. Late subacute to chronic infarct within left occipital lobe. 4. Moderate chronic microvascular ischemic changes and moderate parenchymal volume loss of the brain. MRA head: 1. Poor flow related signal within left MCA M2 inferior sylvian division likely representing thromboembolic occlusion. 2. Otherwise patent circle of Willis. No aneurysm or high-grade stenosis. These  results will be called to the ordering clinician or representative by the Radiologist Assistant, and communication documented in the PACS or zVision Dashboard. Electronically Signed   By: Kristine Garbe M.D.   On: 05/24/2017 15:28   Mr Brain Wo Contrast  Result Date: 05/24/2017 CLINICAL DATA:  82 y/o  F; expressive aphasia. EXAM: MRI HEAD WITHOUT CONTRAST MRA HEAD WITHOUT CONTRAST TECHNIQUE: Multiplanar, multiecho pulse sequences of the brain and surrounding structures were obtained without intravenous contrast. Angiographic images of the head were obtained using MRA technique without contrast. COMPARISON:  05/24/2017 CT head. FINDINGS: MRI HEAD FINDINGS Brain: Small region of reduced diffusion involving the left posterior insula and extending into the left frontoparietal junction cortex compatible with acute/early subacute infarction. Several additional punctate foci of reduced diffusion are present in the bilateral frontal and parietal lobes as well as bilateral cerebellar hemispheres. In the left posterior parietal lobe there is a small region of diffusion and T2 FLAIR hyperintensity without reduced diffusion on ADC probably representing a subacute infarction. There additional foci of T2 FLAIR hyperintense signal abnormality in subcortical periventricular white matter throughout the supratentorial brain that are nonspecific but likely represent moderate chronic microvascular ischemic changes. Moderate brain parenchymal volume loss. Vascular: As below. Skull and upper cervical spine: Normal marrow signal. Sinuses/Orbits: Negative. Other: None. MRA HEAD FINDINGS Anterior circulation: Right cavernous ICA laterally directed 2 mm sacculation. No additional finding for occlusion, aneurysm, or significant  stenosis in bilateral ICA, bilateral ACA, and right MCA. Patent left M1 and proximal M2 branches. Poor flow related signal within left M2 inferior division (series 4, image 105 and series 406, image 5).  Posterior circulation: No large vessel occlusion, aneurysm, or significant stenosis is identified. Anatomic variation: Patent anterior communicating artery. Bilateral fetal PCA. IMPRESSION: MRI head: 1. Small region of acute/early subacute infarction within the left posterior insula extending into left frontoparietal junction. 2. Additional scattered punctate foci of reduced diffusion are present in the bilateral convexities and cerebellar hemispheres. Multiple vascular territories suggests embolic phenomenon. 3. Late subacute to chronic infarct within left occipital lobe. 4. Moderate chronic microvascular ischemic changes and moderate parenchymal volume loss of the brain. MRA head: 1. Poor flow related signal within left MCA M2 inferior sylvian division likely representing thromboembolic occlusion. 2. Otherwise patent circle of Willis. No aneurysm or high-grade stenosis. These results will be called to the ordering clinician or representative by the Radiologist Assistant, and communication documented in the PACS or zVision Dashboard. Electronically Signed   By: Kristine Garbe M.D.   On: 05/24/2017 15:28   Ct Head Code Stroke Wo Contrast`  Result Date: 05/24/2017 CLINICAL DATA:  Code stroke.  Aphasia and confusion. EXAM: CT HEAD WITHOUT CONTRAST TECHNIQUE: Contiguous axial images were obtained from the base of the skull through the vertex without intravenous contrast. COMPARISON:  None. FINDINGS: Brain: Moderate diffuse white matter changes are present. No acute or focal cortical abnormality is present. The basal ganglia are intact. The insular ribbon is normal bilaterally. The brainstem and cerebellum are normal. Vascular: Atherosclerotic calcifications are present within the cavernous internal carotid artery is bilaterally. There is no hyperdense vessel. Skull: The calvarium is intact. No focal lytic or blastic lesions are present. Sinuses/Orbits: The paranasal sinuses and mastoid air cells are  clear. Bilateral lens replacements are present. The globes and orbits are within normal limits bilaterally. ASPECTS [facility] Stroke Program Early CT Score) - Ganglionic level infarction (caudate, lentiform nuclei, internal capsule, insula, M1-M3 cortex): 7/7 - Supraganglionic infarction (M4-M6 cortex): 3/3 Total score (0-10 with 10 being normal): 10/10 IMPRESSION: 1. No acute intracranial abnormality. 2. ASPECTS is 10/10 These results were called by telephone at the time of interpretation on 05/24/2017 at 1:53 pm to Dr. Rory Percy, who verbally acknowledged these results. Electronically Signed   By: San Morelle M.D.   On: 05/24/2017 13:53    ASSESMENT:   *  Hematochezia after receiving TPA for acute CVA.   Established dx of colon diverticulosis from colonoscopy 2002 and 2006 BE, duodenal ulcerations, gastritis on 2002 EGD.  Previous, apparently unrevealing, capsule endoscopy.     *  Normocytic anemia.  S/p PRBC x 2 with stable Hgb since.    *  Stage 4-5 CKD.  ? How much is AKI vs CKD? 2006 left nephrectomy for renal cell cancer    PLAN   *  ? pursue EGD colonoscopy while inpt?  Pt wants to discuss this with stroke team before deciding.        [name]  05/26/2017, 9:17 AM Pager: [phone]  ________________________________________________________________________  Velora Heckler GI MD note:  I reviewed the data and agree with the assessment and plan described above.  Awaiting word from team, patient if she wants to proceed with colonoscopy/EGD while in hospital.     Owens Loffler, MD [facility] Pager [phone]

## 2017-05-26 NOTE — Progress Notes (Signed)
OT Cancellation Note  Patient Details Name: Shawna Schneider MRN: [id number] DOB: 04/01/1935   Cancelled Treatment:    Reason Eval/Treat Not Completed: Patient at procedure or test/ unavailable. Attempted again this pm but pt at test. Will follow up in am.   [place], OT/L  [phone] 05/26/2017 05/26/2017, 4:26 PM

## 2017-05-26 NOTE — Progress Notes (Signed)
OT Cancellation Note  Patient Details Name: Shawna Schneider MRN: [id number] DOB: 1935/06/10   Cancelled Treatment:    Reason Eval/Treat Not Completed: Patient at procedure or test/ unavailable. Will attempt later if able.   [place], OT/L  [phone] 05/26/2017 05/26/2017, 2:08 PM

## 2017-05-26 NOTE — Progress Notes (Signed)
PT Cancellation Note  Patient Details Name: Shawna Schneider MRN: [id number] DOB: 05-Jul-1935   Cancelled Treatment:    Reason Eval/Treat Not Completed: Patient at procedure or test/unavailable Per RN, pt at test. Will reattempt as schedule allows.   Leighton Ruff, PT, DPT  Acute Rehabilitation Services  Pager: [phone]    Rudean Hitt 05/26/2017, 3:23 PM

## 2017-05-27 MED ORDER — PEG-KCL-NACL-NASULF-NA ASC-C 100 G PO SOLR
0.5000 | Freq: Once | ORAL | Status: AC
  Administered 2017-05-27: 100 g via ORAL
  Filled 2017-05-27: qty 1

## 2017-05-27 MED ORDER — PEG-KCL-NACL-NASULF-NA ASC-C 100 G PO SOLR
0.5000 | Freq: Once | ORAL | Status: AC
  Administered 2017-05-28: 100 g via ORAL
  Filled 2017-05-27: qty 1

## 2017-05-27 MED ORDER — PEG-KCL-NACL-NASULF-NA ASC-C 100 G PO SOLR: 1.0000 | Freq: Two times a day (BID) | ORAL | Status: DC

## 2017-05-27 NOTE — Progress Notes (Signed)
PT Cancellation Note  Patient Details Name: Shawna Schneider MRN: [id number] DOB: 18-Apr-1935   Cancelled Treatment:    Reason Eval/Treat Not Completed: Other (comment) Pt with DVTs in RLE and unable to go on anticoagulation secondary to possible GI bleed. Spoke with RN and RN cleared for in room mobility, however, pt reporting she just got back to bed and that the MD told her not to walk secondary to DVT. Will hold until medically appropriate.   Leighton Ruff, PT, DPT  Acute Rehabilitation Services  Pager: [phone]   Rudean Hitt 05/27/2017, 3:31 PM

## 2017-05-27 NOTE — Progress Notes (Signed)
STROKE TEAM PROGRESS NOTE   HISTORY OF PRESENT ILLNESS (per record) Shawna Schneider is an 82 y.o. female who is brought from her PCP office to the emergency room due to the fact that she "was not feeling good" per EMS/ per PCP. Upon arrival to the ED patient was not speaking very much however when she did speak she initially started with clear fluent speech and then had made up words. Patient was able to follow commands but not do so with full effort. Patient CT was negative. Upon entering the room and second assessment was done, when asking patient to tell me her name and where she was she did not speak, when asked to move her extremities she would lift them however one hand was placed overhead the hand did not hit her head was diverted. When noxious stimuli was administered patient quickly was able to speak very clearly with no aphasia and no dysarthria. Initially she was not able to move her extremities however when noxious stimuli was given she had 5 out of 5 strength throughout. At a later point, the family arrived and said that she was probably on some blood thinners for irregular heart rhythm. Currently not on any blood thinners. I could not find any records in the electronic medical record of her having a.fibrillation. Also c/o LE pain. 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  Due to initial exam being inconsistent-tPA was not given and MRI ordered.  MRI showed scattered left MCA and cerebellar strokes. tPA was given and she was admitted to Neuro ICU for observation.  Date last known well: Date: 05/24/2017 Time last known well: Time: 1100 hrs tPA Given: yes -stroke on MRI with aphasia NIHSS 2 (one for aphasia and one for dysarthria) Modified Rankin: Rankin Score=0   SUBJECTIVE (INTERVAL HISTORY) She  had TCD bubble study done yesterday but has only a small PFO unlikely to be of clinical significance  No more LGI bleeding. She has met with GI Dr Ardis Hughs  And has agreed to undergoing colonoscopy an  endoscopy tomorrow.   OBJECTIVE CBC:  Recent Labs Lab 05/24/17 1320  05/25/17 1032 05/26/17 0459  WBC 8.2  --  9.5 9.2  NEUTROABS 5.8  --   --   --   HGB 9.1*  < > 10.3* 10.0*  HCT 28.9*  < > 31.8* 31.1*  MCV 82.3  --  83.0 82.3  PLT 246  --  212 218  < > = values in this interval not displayed.  Basic Metabolic Panel:   Recent Labs Lab 05/24/17 1320 05/24/17 1329 05/26/17 0459  NA 138 140 137  K 3.8 3.9 4.1  CL 112* 114* 114*  CO2 16*  --  16*  GLUCOSE 90 85 100*  BUN 37* 35* 35*  CREATININE 3.22* 3.20* 2.67*  CALCIUM 10.5*  --  9.6    Lipid Panel:     Component Value Date/Time   CHOL 94 05/24/2017 2355   TRIG 89 05/24/2017 2355   HDL 28 (L) 05/24/2017 2355   CHOLHDL 3.4 05/24/2017 2355   VLDL 18 05/24/2017 2355   LDLCALC 48 05/24/2017 2355   HgbA1c:  Lab Results  Component Value Date   HGBA1C 6.5 (H) 05/24/2017   Urine Drug Screen: No results found for: LABOPIA, COCAINSCRNUR, LABBENZ, AMPHETMU, THCU, LABBARB  Alcohol Level No results found for: [facility]  IMAGING  Mr Brain Wo Contrast 05/24/2017 1. Small region of acute/early subacute infarction within the left posterior insula extending into left  frontoparietal junction. 2. Additional scattered punctate foci of reduced diffusion are present in the bilateral convexities and cerebellar hemispheres. Multiple vascular territories suggests embolic phenomenon. 3. Late subacute to chronic infarct within left occipital lobe. 4. Moderate chronic microvascular ischemic changes and moderate parenchymal volume loss of the brain.   Mr Angiogram Head Wo Contrast 05/24/2017 1. Poor flow related signal within left MCA M2 inferior sylvian division likely representing thromboembolic occlusion. 2. Otherwise patent circle of Willis. No aneurysm or high-grade stenosis.  Ct Head Code Stroke Wo Contrast` 05/24/2017 IMPRESSION: 1. No acute intracranial abnormality. 2. ASPECTS is 10/10   PHYSICAL EXAM  Temp:  [98.4 F  (36.9 C)-99.3 F (37.4 C)] 98.6 F (37 C) (10/18 1345) Pulse Rate:  [81-92] 91 (10/18 1345) Resp:  [16-18] 17 (10/18 1345) BP: (96-113)/(48-72) 113/72 (10/18 1345) SpO2:  [98 %-100 %] 99 % (10/18 1345)  General - Well nourished, well developedelderly lady, in no apparent distress.  Cardiovascular - Regular rate and rhythm.  Mental Status -  Level of arousal and orientation to time, place, and person were intact. Language including expression, naming, repetition, comprehension was intact. Attention span and concentration were normal. Recent and remote memory were intact.  Cranial Nerves II - XII - II - Visual field intact OU. III, IV, VI - Extraocular movements intact. V - Facial sensation intact bilaterally. VII - Facial movement intact bilaterally. VIII - Hearing & vestibular intact bilaterally. X - Palate elevates symmetrically. XI - Chin turning & shoulder shrug intact bilaterally. XII - Tongue protrusion intact.  Motor Strength - The patient's strength was normal in all extremities and pronator drift was absent.   Motor Tone - Muscle tone was assessed at the neck and appendages and was normal.  Reflexes - The patient's reflexes were 1+ in all extremities and she had no pathological reflexes.  Sensory - Light touch was assessed and was symmetrical.    Coordination - The patient had normal movements in the hands and feet with no ataxia or dysmetria.  Tremor was absent.  Gait and Station - Not observed   ASSESSMENT/PLAN Ms. Shawna Schneider is a 82 y.o. female with history of hypertension, hyperlipidemia, diverticulosis, remote nephrectomy presenting with new onset dysarthria and expressive aphasia. She received tPA on 10/15 _0  1511.   Stroke:  Left posterior insula infarct, bilateral punctate diffusion restriction in cerebellar hemispheres consistent with embolic phenomenon from central source  Resultant  minimal  CT head No acute abnormality  MRI head Small  region of acute/early subacute infarction within the left posterior insula extending into left frontoparietal junction, scattered punctate foci of reduced diffusion in the bilateral convexities and cerebellar hemispheres.  MRA head  Poor flow related signal within left MCA M2 inferior sylvian division likely representing thromboembolic occlusion  Carotid Doppler  Prelim read no significant stenosis 2D Echo  Left ventricle: The cavity size was normal. Wall thickness was   normal. Systolic function was normal. The estimated ejection   fraction was in the range of 55% to 60%. Wall motion was normal;    there were no regional wall motion abnormalities  LEDVT Study Findings consistent with acute deep vein thrombosis involving the right popliteal vein, right posterial tibial vein, and left posterial tibial vein. - As well as intramuscular thrombosis of the right gastrocnemius veins.LDL 48  HgbA1c 6.5%  TCD Bubble study small PFO likely clinically insignificant  SCDs for VTE prophylaxis, can resume enoxaparin tomorrow Diet clear liquid Room service appropriate? Yes; Fluid consistency: Thin Diet  NPO time specified  No antithrombotic prior to admission, now on No antithrombotic  Patient counseled to be compliant with her antithrombotic medications  Ongoing aggressive stroke risk factor management  Therapy recommendations:  Pending  Disposition:  Pending  Hypertension  Stable  Tight control 2/2 tPA administration  Long-term BP goal normotensive  Hyperlipidemia  Home meds: gemfibrozil  LDL 48, goal < 70  Added Rosuvastatin 56m  Continue statin at discharge  Diabetes  HgbA1c 6.5%, goal < 7.0  Controlled  Other Stroke Risk Factors  Advanced age  Obesity, Body mass index is 32.02 kg/m., recommend weight loss, diet and exercise as appropriate   Other Active Problems  Lower GI bleeding after tPA administration, Hx of diveritculosis Hgb 7.5->10.3 after 2 units  pRBCs  Suspect paroxysmal atrial fibrillation given self reported history of arrhythmia, not correlated by records so far. Embolic pattern of stroke would be consistent with cardiac source or paradoxical embolism. Benefit of workup would only be if appropriate and willing for long term anticoagulation.  Other Plans  Discontinue bed rest and start therapy  Observing in Neuro ICU till today PM or tomorrow AM per room availability  GI consultation is appreciated for bleeding  Hospital day # 3  I have personally examined this patient, reviewed notes, independently viewed imaging studies, participated in medical decision making and plan of care.ROS completed by me personally and pertinent positives fully documented  I have made any additions or clarifications directly to the above note.  She presented with expressive aphasia secondary to left MCA branch infarct from atrial fibrillation and MRI shows multiple cerebral infarcts. She developed post tPA and lower GI hemorrhage likely from underlying diverticulosis. She has been transferred 2 units of blood.  .   May need to consider safety of long-term anticoagulation for A. Fib/DVT versus risk for GI bleed from diverticulosis before making final decision prior to discharge. D/w Dr JArdis Hughspatient needs  colonoscopy  Prior to deciding on risk for long term  starting anticoagulation and risking LGI hemorrhage as DVT puts her at high risk for PE.she has only a very small PFO which is less likely to be the cause of her stroke and atrial fibrillation is the more likely etiology. Do not recommend endovascular PFO closure at this time    PAntony Contras MAtchisonPager: [phone]/18/2018 2:06 PM   s  To contact Stroke Continuity provider, please refer to [email or web address] After hours, contact General Neurology

## 2017-05-27 NOTE — Progress Notes (Signed)
 Gastroenterology Progress Note    Since last GI note: She has decided to go ahead with GI testing for her bleeding  Objective: Vital signs in last 24 hours: Temp:  [98.5 F (36.9 C)-99.3 F (37.4 C)] 98.7 F (37.1 C) (10/18 0512) Pulse Rate:  [81-92] 83 (10/18 0512) Resp:  [18] 18 (10/18 0512) BP: (98-111)/(48-71) 108/51 (10/18 0512) SpO2:  [98 %-100 %] 98 % (10/18 0512) Last BM Date: 05/26/17 General: alert and oriented times 3 Heart: regular rate and rythm Abdomen: soft, non-tender, non-distended, normal bowel sounds   Lab Results:  Recent Labs  05/24/17 1320  05/24/17 2348 05/25/17 1032 05/26/17 0459  WBC 8.2  --   --  9.5 9.2  HGB 9.1*  < > 7.5* 10.3* 10.0*  PLT 246  --   --  212 218  MCV 82.3  --   --  83.0 82.3  < > = values in this interval not displayed.  Recent Labs  05/24/17 1320 05/24/17 1329 05/26/17 0459  NA 138 140 137  K 3.8 3.9 4.1  CL 112* 114* 114*  CO2 16*  --  16*  GLUCOSE 90 85 100*  BUN 37* 35* 35*  CREATININE 3.22* 3.20* 2.67*  CALCIUM 10.5*  --  9.6    Recent Labs  05/24/17 1320  PROT 7.0  ALBUMIN 3.4*  AST 20  ALT 13*  ALKPHOS 105  BILITOT 0.4    Recent Labs  05/24/17 1320  INR 1.11     Medications: Scheduled Meds: .  stroke: mapping our early stages of recovery book   Does not apply Once  . aspirin EC  81 mg Oral Daily  . latanoprost  1 drop Both Eyes QHS  . rosuvastatin  5 mg Oral Daily   Continuous Infusions: PRN Meds:.acetaminophen **OR** acetaminophen (TYLENOL) oral liquid 160 mg/5 mL **OR** acetaminophen, senna-docusate    Assessment/Plan: 82 y.o. female limited GI bleeding following tPA for CVA recently  Will go ahead with colonoscopy and EGD tomorrow, split dose prep.   Milus Banister, MD  05/27/2017, 9:36 AM [facility] Pager [phone]

## 2017-05-27 NOTE — Evaluation (Signed)
Occupational Therapy Evaluation Patient Details Name: Shawna Schneider MRN: [id number] DOB: 06/23/1935 Today's Date: 05/27/2017    History of Present Illness Pt is an 82 y/o F who was brought from her PCP office to the emergency room due to the fact that she "was not feeling good" per EMS/ per PCP; initially Pt with inconsistent neurological symtoms; MRI ordered and showed scattered left MCA and cerebellar strokes, tPA administered; after tPA she started having bright red blood in her stools, and was noted to be mildly hypotensive, GI consulted and is following; PMHx includes left renal cell carcinoma resection, chronic history of anemia, hyperlipidemia    Clinical Impression   This 82 y/o F presents with the above. Pt lives alone, at baseline is independent with ADLs and functional mobility. Pt requesting need to toilet upon entering room, completing room level mobility and task with MinGuard-supervision throughout. Pt completed seated and standing ADLs in room with overall MinGuard. Feel Pt should safely progress home with intermittent assist PRN from family. Will continue to follow acutely to progress Pt's safety and independence with ADLs and functional mobility.     Follow Up Recommendations  No OT follow up;Supervision - Intermittent    Equipment Recommendations  None recommended by OT           Precautions / Restrictions Precautions Precautions: None Restrictions Weight Bearing Restrictions: No      Mobility Bed Mobility Overal bed mobility: Needs Assistance Bed Mobility: Supine to Sit     Supine to sit: Supervision     General bed mobility comments: HOB elevated; supervision for safety   Transfers Overall transfer level: Needs assistance   Transfers: Sit to/from Stand Sit to Stand: Supervision         General transfer comment: supervision for safety     Balance Overall balance assessment: Needs assistance Sitting-balance support: Feet supported Sitting  balance-Leahy Scale: Good     Standing balance support: No upper extremity supported;During functional activity Standing balance-Leahy Scale: Fair                             ADL either performed or assessed with clinical judgement   ADL Overall ADL's : Needs assistance/impaired Eating/Feeding: Independent;Sitting   Grooming: Wash/dry hands;Wash/dry face;Oral care;Min guard;Standing   Upper Body Bathing: Supervision/ safety;Sitting   Lower Body Bathing: Min guard;Sit to/from stand   Upper Body Dressing : Set up;Sitting   Lower Body Dressing: Min guard;Sit to/from stand Lower Body Dressing Details (indicate cue type and reason): Pt adjusting socks seated EOB utilizing figure 4 technique with minguard for safety  Toilet Transfer: Min guard;Ambulation;Regular Toilet;Grab bars   Toileting- Clothing Manipulation and Hygiene: Sit to/from stand;Supervision/safety       Functional mobility during ADLs: Min guard       Vision Baseline Vision/History: Wears glasses Wears Glasses: At all times Patient Visual Report: No change from baseline Vision Assessment?: No apparent visual deficits     Perception     Praxis      Pertinent Vitals/Pain Pain Assessment: No/denies pain     Hand Dominance Right   Extremity/Trunk Assessment Upper Extremity Assessment Upper Extremity Assessment: Overall WFL for tasks assessed   Lower Extremity Assessment Lower Extremity Assessment: Defer to PT evaluation       Communication Communication Communication: No difficulties   Cognition Arousal/Alertness: Awake/alert Behavior During Therapy: WFL for tasks assessed/performed Overall Cognitive Status: Within Functional Limits for tasks assessed  General Comments  Pt's son arriving and present during end of session                [place] expects to be discharged to:: Private residence Living  Arrangements: Alone Available Help at Discharge: Family;Available 24 hours/day Type of Home: House Home Access: Stairs to enter [employer] of Steps: 6 Entrance Stairs-Rails: Right Home Layout: Two level;Laundry or work area in Building surveyor of Steps: 6 Alternate Level Stairs-Rails: Right Bathroom Shower/Tub: Other (comment) (walk in bath tub)   Bathroom Toilet: Handicapped height     Home Equipment: Grab bars - tub/shower;Walker - 4 wheels;Cane - single point;Shower seat - built in      Lives With: Alone    Prior Functioning/Environment Level of Independence: Independent                 OT Problem List: Decreased strength;Decreased activity tolerance      OT Treatment/Interventions: Self-care/ADL training;DME and/or AE instruction;Therapeutic activities;Balance training;Patient/family education;Energy conservation    OT Goals(Current goals can be found in the care plan section) Acute Rehab OT Goals Patient Stated Goal: return home  OT Goal Formulation: With patient Time For Goal Achievement: 06/10/17 Potential to Achieve Goals: Good  OT Frequency: Min 2X/week                             AM-PAC PT "6 Clicks" Daily Activity     Outcome Measure Help from another person eating meals?: None Help from another person taking care of personal grooming?: A Little Help from another person toileting, which includes using toliet, bedpan, or urinal?: A Little Help from another person bathing (including washing, rinsing, drying)?: A Little Help from another person to put on and taking off regular upper body clothing?: None Help from another person to put on and taking off regular lower body clothing?: A Little 6 Click Score: 20   End of Session Nurse Communication: Mobility status  Activity Tolerance: Patient tolerated treatment well Patient left: in chair;with call bell/phone within reach;with family/visitor present  OT Visit  Diagnosis: Other symptoms and signs involving the nervous system (W38.937)                Time: 3428-7681 OT Time Calculation (min): 22 min Charges:  OT General Charges $OT Visit: 1 Visit OT Evaluation $OT Eval Low Complexity: 1 Low G-Codes:     Shawna Schneider, OT Pager [phone] 05/27/2017   Shawna Schneider 05/27/2017, 3:43 PM

## 2017-05-28 ENCOUNTER — Inpatient Hospital Stay (HOSPITAL_COMMUNITY): Payer: BC Managed Care – PPO | Admitting: Anesthesiology

## 2017-05-28 ENCOUNTER — Inpatient Hospital Stay (HOSPITAL_COMMUNITY): Payer: BC Managed Care – PPO

## 2017-05-28 ENCOUNTER — Encounter (HOSPITAL_COMMUNITY)
Admission: EM | Disposition: A | Discharge status: Home / Self Care | Payer: Self-pay | Source: Home / Self Care | Attending: Neurology

## 2017-05-28 ENCOUNTER — Encounter (HOSPITAL_COMMUNITY): Payer: Self-pay | Admitting: *Deleted

## 2017-05-28 DIAGNOSIS — D123 Benign neoplasm of transverse colon: Secondary | ICD-10-CM

## 2017-05-28 DIAGNOSIS — K922 Gastrointestinal hemorrhage, unspecified: Secondary | ICD-10-CM

## 2017-05-28 DIAGNOSIS — I6389 Other cerebral infarction: Secondary | ICD-10-CM

## 2017-05-28 DIAGNOSIS — C186 Malignant neoplasm of descending colon: Secondary | ICD-10-CM

## 2017-05-28 HISTORY — PX: COLONOSCOPY WITH PROPOFOL: SHX5780

## 2017-05-28 HISTORY — PX: ESOPHAGOGASTRODUODENOSCOPY: SHX5428

## 2017-05-28 LAB — CBC WITH DIFFERENTIAL/PLATELET
BASOS ABS: 0 10*3/uL (ref 0.0–0.1)
Basophils Relative: 0 %
EOS PCT: 4 %
Eosinophils Absolute: 0.3 10*3/uL (ref 0.0–0.7)
HCT: 33.9 % — ABNORMAL LOW (ref 36.0–46.0)
Hemoglobin: 10.8 g/dL — ABNORMAL LOW (ref 12.0–15.0)
LYMPHS PCT: 18 %
Lymphs Abs: 1.7 10*3/uL (ref 0.7–4.0)
MCH: 26.7 pg (ref 26.0–34.0)
MCHC: 31.9 g/dL (ref 30.0–36.0)
MCV: 83.7 fL (ref 78.0–100.0)
MONO ABS: 0.8 10*3/uL (ref 0.1–1.0)
Monocytes Relative: 9 %
Neutro Abs: 6.4 10*3/uL (ref 1.7–7.7)
Neutrophils Relative %: 69 %
PLATELETS: 238 10*3/uL (ref 150–400)
RBC: 4.05 MIL/uL (ref 3.87–5.11)
RDW: 18.1 % — AB (ref 11.5–15.5)
WBC: 9.3 10*3/uL (ref 4.0–10.5)

## 2017-05-28 SURGERY — COLONOSCOPY WITH PROPOFOL
Anesthesia: Monitor Anesthesia Care

## 2017-05-28 MED ORDER — MEPERIDINE HCL 100 MG/ML IJ SOLN: 6.2500 mg | INTRAMUSCULAR | Status: DC | PRN

## 2017-05-28 MED ORDER — PROPOFOL 10 MG/ML IV BOLUS
INTRAVENOUS | Status: DC | PRN
  Administered 2017-05-28 (×2): 10 mg via INTRAVENOUS
  Administered 2017-05-28: 20 mg via INTRAVENOUS

## 2017-05-28 MED ORDER — FENTANYL CITRATE (PF) 100 MCG/2ML IJ SOLN: 25.0000 ug | INTRAMUSCULAR | Status: DC | PRN

## 2017-05-28 MED ORDER — IOPAMIDOL (ISOVUE-300) INJECTION 61%
INTRAVENOUS | Status: AC
  Filled 2017-05-28: qty 100

## 2017-05-28 MED ORDER — SODIUM CHLORIDE 0.9 % IV SOLN
INTRAVENOUS | Status: DC
  Administered 2017-05-28: 10:00:00 via INTRAVENOUS

## 2017-05-28 MED ORDER — PROPOFOL 500 MG/50ML IV EMUL
INTRAVENOUS | Status: DC | PRN
  Administered 2017-05-28: 100 ug/kg/min via INTRAVENOUS

## 2017-05-28 MED ORDER — SPOT INK MARKER SYRINGE KIT
PACK | SUBMUCOSAL | Status: DC | PRN
  Administered 2017-05-28: 2 mL via SUBMUCOSAL

## 2017-05-28 MED ORDER — SPOT INK MARKER SYRINGE KIT
PACK | SUBMUCOSAL | Status: AC
  Filled 2017-05-28: qty 10

## 2017-05-28 MED ORDER — IOPAMIDOL (ISOVUE-300) INJECTION 61%
INTRAVENOUS | Status: AC
  Administered 2017-05-28: 15:00:00
  Filled 2017-05-28: qty 30

## 2017-05-28 MED ORDER — ONDANSETRON HCL 4 MG/2ML IJ SOLN
INTRAMUSCULAR | Status: DC | PRN
  Administered 2017-05-28: 4 mg via INTRAVENOUS

## 2017-05-28 MED ORDER — LIDOCAINE 2% (20 MG/ML) 5 ML SYRINGE
INTRAMUSCULAR | Status: DC | PRN
  Administered 2017-05-28: 40 mg via INTRAVENOUS

## 2017-05-28 SURGICAL SUPPLY — 22 items

## 2017-05-28 NOTE — Op Note (Signed)
[facility] Patient Name: Shawna Schneider Procedure Date : 05/28/2017 MRN: [id number] Attending MD: Milus Banister , MD Date of Birth: 08/10/1935 CSN: [id number] Age: 82 Admit Type: Inpatient Procedure:                Upper GI endoscopy Indications:              Hematochezia following tPa for stroke Providers:                Milus Banister, MD, Burtis Junes, RN, William Dalton, Technician Referring MD:              Medicines:                Monitored Anesthesia Care Complications:            No immediate complications. Estimated blood loss:                            None. Estimated Blood Loss:     Estimated blood loss: none. Procedure:                Pre-Anesthesia Assessment:                           - Prior to the procedure, a History and Physical                            was performed, and patient medications and                            allergies were reviewed. The patient's tolerance of                            previous anesthesia was also reviewed. The risks                            and benefits of the procedure and the sedation                            options and risks were discussed with the patient.                            All questions were answered, and informed consent                            was obtained. Prior Anticoagulants: The patient has                            taken no previous anticoagulant or antiplatelet                            agents. ASA Grade Assessment: III - A patient with  severe systemic disease. After reviewing the risks                            and benefits, the patient was deemed in                            satisfactory condition to undergo the procedure.                           After obtaining informed consent, the endoscope was                            passed under direct vision. Throughout the                            procedure, the patient's  blood pressure, pulse, and                            oxygen saturations were monitored continuously. The                            [id number] ([id number]) scope was introduced through the                            mouth, and advanced to the second part of duodenum.                            The upper GI endoscopy was accomplished without                            difficulty. The patient tolerated the procedure                            well. Scope In: Scope Out: Findings:      Tortuous esophagus with minor mucosal ring at the GE junction.      Medium to large hiatal hernia, ?paraesophageal component.      The examined duodenum was normal. Impression:               - Schatzki's ring and medium to large hiatal hernia.                           - Otherwise normal examination. Moderate Sedation:      none Recommendation:           - Return patient to hospital ward for ongoing care.                           - Clear liquid diet until seen by surgery.                           - Continue present medications. Procedure Code(s):        --- Professional ---                           [phone],  Esophagogastroduodenoscopy, flexible,                            transoral; diagnostic, including collection of                            specimen(s) by brushing or washing, when performed                            (separate procedure) Diagnosis Code(s):        --- Professional ---                           K92.1, Melena (includes Hematochezia) CPT copyright 2016 American Medical Association. All rights reserved. The codes documented in this report are preliminary and upon coder review may  be revised to meet current compliance requirements. Milus Banister, MD 05/28/2017 11:02:41 AM This report has been signed electronically. Number of Addenda: 0

## 2017-05-28 NOTE — H&P (View-Only) (Signed)
[facility] Progress Note    Since last GI note: She has decided to go ahead with GI testing for her bleeding  Objective: Vital signs in last 24 hours: Temp:  [98.5 F (36.9 C)-99.3 F (37.4 C)] 98.7 F (37.1 C) (10/18 0512) Pulse Rate:  [81-92] 83 (10/18 0512) Resp:  [18] 18 (10/18 0512) BP: (98-111)/(48-71) 108/51 (10/18 0512) SpO2:  [98 %-100 %] 98 % (10/18 0512) Last BM Date: 05/26/17 General: alert and oriented times 3 Heart: regular rate and rythm Abdomen: soft, non-tender, non-distended, normal bowel sounds   Lab Results:  Recent Labs  05/24/17 1320  05/24/17 2348 05/25/17 1032 05/26/17 0459  WBC 8.2  --   --  9.5 9.2  HGB 9.1*  < > 7.5* 10.3* 10.0*  PLT 246  --   --  212 218  MCV 82.3  --   --  83.0 82.3  < > = values in this interval not displayed.  Recent Labs  05/24/17 1320 05/24/17 1329 05/26/17 0459  NA 138 140 137  K 3.8 3.9 4.1  CL 112* 114* 114*  CO2 16*  --  16*  GLUCOSE 90 85 100*  BUN 37* 35* 35*  CREATININE 3.22* 3.20* 2.67*  CALCIUM 10.5*  --  9.6    Recent Labs  05/24/17 1320  PROT 7.0  ALBUMIN 3.4*  AST 20  ALT 13*  ALKPHOS 105  BILITOT 0.4    Recent Labs  05/24/17 1320  INR 1.11     Medications: Scheduled Meds: .  stroke: mapping our early stages of recovery book   Does not apply Once  . aspirin EC  81 mg Oral Daily  . latanoprost  1 drop Both Eyes QHS  . rosuvastatin  5 mg Oral Daily   Continuous Infusions: PRN Meds:.acetaminophen **OR** acetaminophen (TYLENOL) oral liquid 160 mg/5 mL **OR** acetaminophen, senna-docusate    Assessment/Plan: 82 y.o. female limited GI bleeding following tPA for CVA recently  Will go ahead with colonoscopy and EGD tomorrow, split dose prep.   Milus Banister, MD  05/27/2017, 9:36 AM [facility] Pager [phone]

## 2017-05-28 NOTE — Care Management Note (Signed)
Case Management Note  Patient Details  Name: Shawna Schneider MRN: [id number] Date of Birth: March 10, 1935  Subjective/Objective:                    Action/Plan: No f/u per OT. Awaiting PT eval. CM following for d/c needs, physician orders.   Expected Discharge Date:                  Expected Discharge Plan:  Home/Self Care  In-House Referral:     Discharge planning Services     Post Acute Care Choice:    Choice offered to:     DME Arranged:    DME Agency:     HH Arranged:    HH Agency:     Status of Service:  In process, will continue to follow  If discussed at Long Length of Stay Meetings, dates discussed:    Additional Comments:  Pollie Friar, RN 05/28/2017, 2:55 PM

## 2017-05-28 NOTE — Progress Notes (Signed)
PT Cancellation Note  Patient Details Name: Shawna Schneider MRN: [id number] DOB: 31-Dec-1934   Cancelled Treatment:    Reason Eval/Treat Not Completed: Patient at procedure or test/unavailable   Duncan Dull 05/28/2017, 10:10 AM

## 2017-05-28 NOTE — Op Note (Signed)
[facility] Patient Name: Shawna Schneider Procedure Date : 05/28/2017 MRN: [id number] Attending MD: Milus Banister , MD Date of Birth: September 15, 1934 CSN: [id number] Age: 82 Admit Type: Inpatient Procedure:                Colonoscopy Indications:              Hematochezia after tPa for stroke symptoms Providers:                Milus Banister, MD, Burtis Junes, RN, William Dalton, Technician Referring MD:              Medicines:                Monitored Anesthesia Care Complications:            No immediate complications. Estimated blood loss:                            None. Estimated Blood Loss:     Estimated blood loss: none. Procedure:                Pre-Anesthesia Assessment:                           - Prior to the procedure, a History and Physical                            was performed, and patient medications and                            allergies were reviewed. The patient's tolerance of                            previous anesthesia was also reviewed. The risks                            and benefits of the procedure and the sedation                            options and risks were discussed with the patient.                            All questions were answered, and informed consent                            was obtained. Prior Anticoagulants: The patient has                            taken no previous anticoagulant or antiplatelet                            agents. ASA Grade Assessment: III - A patient with  severe systemic disease. After reviewing the risks                            and benefits, the patient was deemed in                            satisfactory condition to undergo the procedure.                           After obtaining informed consent, the colonoscope                            was passed under direct vision. Throughout the                            procedure, the patient's  blood pressure, pulse, and                            oxygen saturations were monitored continuously. The                            EC-[id number] ([id number]) scope was introduced through                            the anus with the intention of advancing to the                            cecum. The scope was advanced to the descending                            colon before the procedure was aborted. Medications                            were given. The colonoscopy was performed without                            difficulty. The patient tolerated the procedure                            well. The quality of the bowel preparation was                            good. The rectum was photographed. Scope In: 10:31:09 AM Scope Out: 10:41:53 AM Total Procedure Duration: 0 hours 10 minutes 44 seconds  Findings:      There was a large, partially obstructing but soft tumor in the       descending colon. This looks unusual for colon adenocarcinoma       (lymphoma?, VERY large polyp?). The distal edge was tattooed with an       injection of [place] ink.      I was unable to advance the colonoscope proximal to the descending colon       tumor, so this was an incomplete examination.      The exam was otherwise without abnormality on direct and  retroflexion       views. Impression:               - Partially obstructing tumor in the descending                            colon; not typical appearing for adenocarcinoma.                            The tumor is soft, frond-like, very large and                            nearly circumferential. I could not advance the                            colonoscope proximal to it. Multiple biopsies taken                            and the distal edge was labled with submucosal                            injection of [place] Ink (?atypical appearing                            adenocarcinoma, ?very large TVA, ?lymphoma, ?other).                           - The examination was  otherwise normal on direct                            and retroflexion views. Moderate Sedation:      none Recommendation:           - EGD now.                           - Await pathology results.                           - CT scan chest, abd, pelvis and CEA. I will order.                           - CC surgery consultation.                           - Should not start anticoagulation for now. Should                            consider IVC filter. Procedure Code(s):        --- Professional ---                           [phone], 52, Colonoscopy, flexible; with directed                            submucosal injection(s), any substance  59935, 52, Colonoscopy, flexible; with biopsy,                            single or multiple Diagnosis Code(s):        --- Professional ---                           D49.0, Neoplasm of unspecified behavior of                            digestive system                           K92.1, Melena (includes Hematochezia) CPT copyright 2016 American Medical Association. All rights reserved. The codes documented in this report are preliminary and upon coder review may  be revised to meet current compliance requirements. Milus Banister, MD 05/28/2017 10:58:31 AM This report has been signed electronically. Number of Addenda: 0

## 2017-05-28 NOTE — Interval H&P Note (Signed)
History and Physical Interval Note:  05/28/2017 9:59 AM  Shawna Schneider  has presented today for surgery, with the diagnosis of GI bleeding  The various methods of treatment have been discussed with the patient and family. After consideration of risks, benefits and other options for treatment, the patient has consented to  Procedure(s): COLONOSCOPY WITH PROPOFOL (N/A) ESOPHAGOGASTRODUODENOSCOPY (EGD) (N/A) as a surgical intervention .  The patient's history has been reviewed, patient examined, no change in status, stable for surgery.  I have reviewed the patient's chart and labs.  Questions were answered to the patient's satisfaction.     Milus Banister

## 2017-05-28 NOTE — Transfer of Care (Signed)
Immediate Anesthesia Transfer of Care Note  Patient: Shawna Schneider  Procedure(s) Performed: COLONOSCOPY WITH PROPOFOL (N/A ) ESOPHAGOGASTRODUODENOSCOPY (EGD) (N/A )  Patient Location: Endoscopy Unit  Anesthesia Type:MAC  Level of Consciousness: awake and alert   Airway & Oxygen Therapy: Patient Spontanous Breathing and Patient connected to nasal cannula oxygen  Post-op Assessment: Report given to RN and Post -op Vital signs reviewed and stable  Post vital signs: Reviewed and stable  Last Vitals:  Vitals:   05/28/17 1000 05/28/17 1100  BP: 127/73 (!) 145/85  Pulse:  (!) 107  Resp: 20 (!) 25  Temp: 36.7 C 36.5 C  SpO2: 98% 100%    Last Pain:  Vitals:   05/28/17 1100  TempSrc: Oral  PainSc:          Complications: No apparent anesthesia complications

## 2017-05-28 NOTE — Anesthesia Procedure Notes (Signed)
Procedure Name: MAC Performed by: Kiyoshi Schaab B Pre-anesthesia Checklist: Patient identified, Emergency Drugs available, Suction available, Patient being monitored and Timeout performed Patient Re-evaluated:Patient Re-evaluated prior to induction Oxygen Delivery Method: Nasal cannula Placement Confirmation: positive ETCO2 and breath sounds checked- equal and bilateral

## 2017-05-28 NOTE — Progress Notes (Signed)
STROKE TEAM PROGRESS NOTE   HISTORY OF PRESENT ILLNESS (per record) Shawna Schneider is an 82 y.o. female who is brought from her PCP office to the emergency room due to the fact that she "was not feeling good" per EMS/ per PCP. Upon arrival to the ED patient was not speaking very much however when she did speak she initially started with clear fluent speech and then had made up words. Patient was able to follow commands but not do so with full effort. Patient CT was negative. Upon entering the room and second assessment was done, when asking patient to tell me her name and where she was she did not speak, when asked to move her extremities she would lift them however one hand was placed overhead the hand did not hit her head was diverted. When noxious stimuli was administered patient quickly was able to speak very clearly with no aphasia and no dysarthria. Initially she was not able to move her extremities however when noxious stimuli was given she had 5 out of 5 strength throughout. At a later point, the family arrived and said that she was probably on some blood thinners for irregular heart rhythm. Currently not on any blood thinners. I could not find any records in the electronic medical record of her having a.fibrillation. Also c/o LE pain. 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  Due to initial exam being inconsistent-tPA was not given and MRI ordered.  MRI showed scattered left MCA and cerebellar strokes. tPA was given and she was admitted to Neuro ICU for observation.  Date last known well: Date: 05/24/2017 Time last known well: Time: 1100 hrs tPA Given: yes - Monday 05/24/2017 at 1511 stroke on MRI with aphasia NIHSS 2 (one for aphasia and one for dysarthria) Modified Rankin: Rankin Score=0    SUBJECTIVE (INTERVAL HISTORY) She  had TCD bubble study done yesterday but has only a small PFO unlikely to be of clinical significance  No more LGI bleeding. She has met with GI Dr Ardis Hughs  And has agreed to  undergoing colonoscopy an endoscopy tomorrow.   OBJECTIVE CBC:  Recent Labs Lab 05/24/17 1320  05/25/17 1032 05/26/17 0459  WBC 8.2  --  9.5 9.2  NEUTROABS 5.8  --   --   --   HGB 9.1*  < > 10.3* 10.0*  HCT 28.9*  < > 31.8* 31.1*  MCV 82.3  --  83.0 82.3  PLT 246  --  212 218  < > = values in this interval not displayed.  Basic Metabolic Panel:   Recent Labs Lab 05/24/17 1320 05/24/17 1329 05/26/17 0459  NA 138 140 137  K 3.8 3.9 4.1  CL 112* 114* 114*  CO2 16*  --  16*  GLUCOSE 90 85 100*  BUN 37* 35* 35*  CREATININE 3.22* 3.20* 2.67*  CALCIUM 10.5*  --  9.6    Lipid Panel:     Component Value Date/Time   CHOL 94 05/24/2017 2355   TRIG 89 05/24/2017 2355   HDL 28 (L) 05/24/2017 2355   CHOLHDL 3.4 05/24/2017 2355   VLDL 18 05/24/2017 2355   LDLCALC 48 05/24/2017 2355   HgbA1c:  Lab Results  Component Value Date   HGBA1C 6.5 (H) 05/24/2017   Urine Drug Screen: No results found for: LABOPIA, COCAINSCRNUR, LABBENZ, AMPHETMU, THCU, LABBARB  Alcohol Level No results found for: [facility]  IMAGING  Mr Brain Wo Contrast 05/24/2017 1. Small region of acute/early subacute infarction within the  left posterior insula extending into left frontoparietal junction. 2. Additional scattered punctate foci of reduced diffusion are present in the bilateral convexities and cerebellar hemispheres. Multiple vascular territories suggests embolic phenomenon. 3. Late subacute to chronic infarct within left occipital lobe. 4. Moderate chronic microvascular ischemic changes and moderate parenchymal volume loss of the brain.   Mr Angiogram Head Wo Contrast 05/24/2017 1. Poor flow related signal within left MCA M2 inferior sylvian division likely representing thromboembolic occlusion. 2. Otherwise patent circle of Willis. No aneurysm or high-grade stenosis.  Ct Head Code Stroke Wo Contrast` 05/24/2017 IMPRESSION: 1. No acute intracranial abnormality. 2. ASPECTS is 10/10   PHYSICAL  EXAM  Temp:  [97.7 F (36.5 C)-98.6 F (37 C)] 98.3 F (36.8 C) (10/19 0452) Pulse Rate:  [88-98] 95 (10/19 0452) Resp:  [16-20] 20 (10/19 0452) BP: (96-131)/(50-83) 118/72 (10/19 0452) SpO2:  [97 %-100 %] 98 % (10/19 0452)  General - Well nourished, well developedelderly lady, in no apparent distress.  Cardiovascular - Regular rate and rhythm.  Mental Status -  Level of arousal and orientation to time, place, and person were intact. Language including expression, naming, repetition, comprehension was intact. Attention span and concentration were normal. Recent and remote memory were intact.  Cranial Nerves II - XII - II - Visual field intact OU. III, IV, VI - Extraocular movements intact. V - Facial sensation intact bilaterally. VII - Facial movement intact bilaterally. VIII - Hearing & vestibular intact bilaterally. X - Palate elevates symmetrically. XI - Chin turning & shoulder shrug intact bilaterally. XII - Tongue protrusion intact.  Motor Strength - The patient's strength was normal in all extremities and pronator drift was absent.   Motor Tone - Muscle tone was assessed at the neck and appendages and was normal.  Reflexes - The patient's reflexes were 1+ in all extremities and she had no pathological reflexes.  Sensory - Light touch was assessed and was symmetrical.    Coordination - The patient had normal movements in the hands and feet with no ataxia or dysmetria.  Tremor was absent.  Gait and Station - Not observed   ASSESSMENT/PLAN Ms. Shawna Schneider is a 82 y.o. female with history of hypertension, hyperlipidemia, diverticulosis, remote nephrectomy presenting with new onset dysarthria and expressive aphasia. She received tPA on Monday 10/15 at 1511.   Stroke:  Left posterior insula infarct, bilateral punctate diffusion restriction in cerebellar hemispheres consistent with embolic phenomenon from central source  Resultant  minimal  CT head No acute  abnormality  MRI head Small region of acute/early subacute infarction within the left posterior insula extending into left frontoparietal junction, scattered punctate foci of reduced diffusion in the bilateral convexities and cerebellar hemispheres.  MRA head  Poor flow related signal within left MCA M2 inferior sylvian division likely representing thromboembolic occlusion  Carotid Doppler  Prelim read no significant stenosis 2D Echo  Left ventricle: The cavity size was normal. Wall thickness was   normal. Systolic function was normal. The estimated ejection   fraction was in the range of 55% to 60%. Wall motion was normal;    there were no regional wall motion abnormalities  LEDVT Study Findings consistent with acute deep vein thrombosis involving the right popliteal vein, right posterial tibial vein, and left posterial tibial vein. - As well as intramuscular thrombosis of the right gastrocnemius veins.LDL 48  HgbA1c 6.5%  TCD Bubble study small PFO likely clinically insignificant  SCDs for VTE prophylaxis, can resume enoxaparin tomorrow Diet NPO time specified  No antithrombotic prior to admission, now on No antithrombotic  Patient counseled to be compliant with her antithrombotic medications  Ongoing aggressive stroke risk factor management  Therapy recommendations:  No OT f/u recommended. PT evaluation pending.  Disposition:  Pending  Hypertension  Stable  Tight control 2/2 tPA administration  Long-term BP goal normotensive  Hyperlipidemia  Home meds: gemfibrozil  LDL 48, goal < 70  Added Rosuvastatin 88m  Continue statin at discharge  Diabetes  HgbA1c 6.5%, goal < 7.0  Controlled  Other Stroke Risk Factors  Advanced age  Obesity, Body mass index is 32.02 kg/m., recommend weight loss, diet and exercise as appropriate   Other Active Problems  Lower GI bleeding after tPA administration, Hx of diveritculosis Hgb 7.5->10.3 after 2 units  pRBCs  Suspect paroxysmal atrial fibrillation given self reported history of arrhythmia, not correlated by records so far. Embolic pattern of stroke would be consistent with cardiac source or paradoxical embolism. Benefit of workup would only be if appropriate and willing for long term anticoagulation.  Anemia - stable - Hb 7.5 -> 10.3 -> 10.0 -> 10.8 (today)  Other Plans  Discontinue bed rest and start therapy  Transferred to floor.  GI consultation is appreciated for bleeding  Consider safety of long-term anticoagulation for A. Fib/DVT versus risk for GI bleed from diverticulosis before making final decision prior to discharge.  Hospital day # 4  I have personally examined this patient, reviewed notes, independently viewed imaging studies, participated in medical decision making and plan of care.ROS completed by me personally and pertinent positives fully documented  I have made any additions or clarifications directly to the above note.  She presented with expressive aphasia secondary to left MCA branch infarct from atrial fibrillation and MRI shows multiple cerebral infarcts. She developed post tPA and lower GI hemorrhage likely from underlying diverticulosis. She has been transferred 2 units of blood. May need to consider safety of long-term anticoagulation for A. Fib/DVT versus risk for GI bleed from diverticulosis before making final decision prior to discharge. D/w Dr JArdis Hughspatient needs  colonoscopy  Prior to deciding on risk for long term  starting anticoagulation and risking LGI hemorrhage as DVT puts her at high risk for PE.she has only a very small PFO which is less likely to be the cause of her stroke and atrial fibrillation is the more likely etiology. Do not recommend endovascular PFO closure at this time      To contact Stroke Continuity provider, please refer to [email or web address] After hours, contact General Neurology

## 2017-05-28 NOTE — Anesthesia Preprocedure Evaluation (Signed)
Anesthesia Evaluation  Patient identified by MRN, date of birth, ID band Patient awake    Reviewed: Allergy & Precautions, NPO status , Patient's Chart, lab work & pertinent test results  Airway Mallampati: II  TM Distance: >3 FB Neck ROM: Full    Dental no notable dental hx.    Pulmonary neg pulmonary ROS,    Pulmonary exam normal breath sounds clear to auscultation       Cardiovascular negative cardio ROS Normal cardiovascular exam Rhythm:Regular Rate:Normal     Neuro/Psych CVA, No Residual Symptoms negative neurological ROS  negative psych ROS   GI/Hepatic negative GI ROS, Neg liver ROS,   Endo/Other  negative endocrine ROS  Renal/GU Renal diseasenegative Renal ROS  negative genitourinary   Musculoskeletal negative musculoskeletal ROS (+)   Abdominal   Peds negative pediatric ROS (+)  Hematology negative hematology ROS (+)   Anesthesia Other Findings   Reproductive/Obstetrics negative OB ROS                             Anesthesia Physical Anesthesia Plan  ASA: III  Anesthesia Plan: MAC   Post-op Pain Management:    Induction: Intravenous  PONV Risk Score and Plan: 2 and Ondansetron, Dexamethasone and Treatment may vary due to age or medical condition  Airway Management Planned: Mask, Natural Airway and Nasal Cannula  Additional Equipment:   Intra-op Plan:   Post-operative Plan:   Informed Consent:   Plan Discussed with:   Anesthesia Plan Comments:         Anesthesia Quick Evaluation

## 2017-05-28 NOTE — Consult Note (Signed)
[facility] Surgery Consult Note  Shawna Schneider 07-Aug-1935  [id number].    Requesting MD: Ardis Hughs Chief Complaint/Reason for Consult: Large polypoid mass on colonoscopy HPI:  Patient is a 82 y/o female who presented to [facility] Monday from her doctor's office for concern of stroke due to garbled speech. Patient was given TPA and then early Tuesday morning had 1 bloody stool. All stools since then have been without blood to patient's knowledge. Colonoscopy done 10/19 and large partially obstructing polyp-like mass seen in descending colon. Multiple biopsies were taken and the lesion was tattooed. Patient has a history of left renal cancer and has had left nephrectomy with adrenalectomy back in 2006. No other abdominal surgeries. Had a virtual colonoscopy 10 years ago, had other colonoscopies in the past and does not remember being told of any abnormal findings with those. No family history of colorectal cancer. Has not noted any blood in her stools prior to this.   Patient noted to have DVT involving right popliteal, right posterior tibial, and left posterior tibial veins.  ROS: Review of Systems  Respiratory: Negative for shortness of breath and wheezing.   Cardiovascular: Negative for chest pain and palpitations.  Gastrointestinal: Negative for abdominal pain, blood in stool and diarrhea.  Neurological: Positive for speech change (now resolved). Negative for tingling, sensory change and focal weakness.  All other systems reviewed and are negative.   History reviewed. No pertinent family history.  Past Medical History:  Diagnosis Date  . Hyperlipemia   . Solitary kidney     Past Surgical History:  Procedure Laterality Date  . KIDNEY SURGERY      Social History:  reports that she has never smoked. She has never used smokeless tobacco. She reports that she does not drink alcohol or use drugs.  Allergies:  Allergies  Allergen Reactions  . Penicillins     Medications Prior  to Admission  Medication Sig Dispense Refill  . allopurinol (ZYLOPRIM) 100 MG tablet Take 100 mg by mouth daily.  0  . diltiazem (CARTIA XT) 180 MG 24 hr capsule Take 180 mg by mouth daily.    Marland Kitchen levothyroxine (SYNTHROID, LEVOTHROID) 75 MCG tablet Take 75 mcg by mouth daily.  0  . prednisoLONE acetate (PRED FORTE) 1 % ophthalmic suspension Place 1 drop into the left eye daily as needed.   0  . rosuvastatin (CRESTOR) 20 MG tablet Take 5 mg by mouth daily.     . valsartan-hydrochlorothiazide (DIOVAN-HCT) 320-12.5 MG tablet Take 1 tablet by mouth daily.  0    Blood pressure 125/71, pulse 65, temperature 98.3 F (36.8 C), temperature source Oral, resp. rate 16, height 5\' 3"  (1.6 m), weight 81.2 kg (179 lb), SpO2 100 %. Physical Exam: Physical Exam  Constitutional: She is oriented to person, place, and time. She appears well-developed. She is cooperative.  Non-toxic appearance. No distress.  HENT:  Head: Normocephalic and atraumatic.  Right Ear: External ear normal.  Left Ear: External ear normal.  Nose: Nose normal.  Mouth/Throat: Oropharynx is clear and moist and mucous membranes are normal.  Eyes: Conjunctivae and lids are normal. No scleral icterus.  Neck: Normal range of motion. Neck supple. No thyromegaly present.  Cardiovascular:  Pulses:      Dorsalis pedis pulses are 2+ on the right side, and 2+ on the left side.  Pulmonary/Chest: Effort normal.  Abdominal: Soft. She exhibits no distension and no mass. There is no hepatosplenomegaly. There is no tenderness. There is no rigidity, no rebound and  no guarding. No hernia.  Laparoscopy scars from left nephrectomy  Musculoskeletal:  ROM intact in bilateral upper and lower extremities  Neurological: She is alert and oriented to person, place, and time.  Skin: Skin is warm, dry and intact. No rash noted. She is not diaphoretic.  Psychiatric: She has a normal mood and affect. Her speech is normal and behavior is normal.    Results for  orders placed or performed during the hospital encounter of 05/24/17 (from the past 48 hour(s))  Glucose, capillary     Status: None   Collection Time: 05/26/17  5:20 PM  Result Value Ref Range   Glucose-Capillary 91 65 - 99 mg/dL  CBC with Differential/Platelet     Status: Abnormal   Collection Time: 05/28/17 12:20 PM  Result Value Ref Range   WBC 9.3 4.0 - 10.5 K/uL   RBC 4.05 3.87 - 5.11 MIL/uL   Hemoglobin 10.8 (L) 12.0 - 15.0 g/dL   HCT 33.9 (L) 36.0 - 46.0 %   MCV 83.7 78.0 - 100.0 fL   MCH 26.7 26.0 - 34.0 pg   MCHC 31.9 30.0 - 36.0 g/dL   RDW 18.1 (H) 11.5 - 15.5 %   Platelets 238 150 - 400 K/uL   Neutrophils Relative % 69 %   Neutro Abs 6.4 1.7 - 7.7 K/uL   Lymphocytes Relative 18 %   Lymphs Abs 1.7 0.7 - 4.0 K/uL   Monocytes Relative 9 %   Monocytes Absolute 0.8 0.1 - 1.0 K/uL   Eosinophils Relative 4 %   Eosinophils Absolute 0.3 0.0 - 0.7 K/uL   Basophils Relative 0 %   Basophils Absolute 0.0 0.0 - 0.1 K/uL   No results found.    Assessment/Plan Recent CVA A.Fib BL LE DVT HTN HLD  Lower GI bleed Large polyp in the descending colon, concerning for malignancy - colonoscopy today - bxs taken, lesion tattooed - CEA pending, CT chest/abd/pelvis pending - Hgb 10.8, VSS - patient would need to be cleared/optimized by cardiology and neurology prior to any surgical intervention - ok with starting lovenox tomorrow and seeing how patient does - if she has continued GI bleeding would need to consider surgery sooner   FEN: clears only  VTE: SCDs, lovenox to start tomorrow  ID: no current abx  Plan: We will continue to follow. Ultimately patient will need resection to remove mass containing portion of descending colon. Question of whether this needs to be done sooner due to need to be on anticoagulation vs. 6-12 weeks from now to allow optimization of other medical conditions prior to surgical intervention.   Brigid Re, [facility] Surgery 05/28/2017,  1:28 PM Pager: [phone] Consults: [phone] Mon-Fri 7:00 am-4:30 pm Sat-Sun 7:00 am-11:30 am

## 2017-05-29 DIAGNOSIS — K6389 Other specified diseases of intestine: Secondary | ICD-10-CM

## 2017-05-29 DIAGNOSIS — C799 Secondary malignant neoplasm of unspecified site: Secondary | ICD-10-CM

## 2017-05-29 DIAGNOSIS — C186 Malignant neoplasm of descending colon: Secondary | ICD-10-CM

## 2017-05-29 DIAGNOSIS — I824Z3 Acute embolism and thrombosis of unspecified deep veins of distal lower extremity, bilateral: Secondary | ICD-10-CM

## 2017-05-29 DIAGNOSIS — I6340 Cerebral infarction due to embolism of unspecified cerebral artery: Secondary | ICD-10-CM

## 2017-05-29 DIAGNOSIS — C189 Malignant neoplasm of colon, unspecified: Secondary | ICD-10-CM

## 2017-05-29 LAB — CEA: CEA1: 39.2 ng/mL — AB (ref 0.0–4.7)

## 2017-05-29 NOTE — Progress Notes (Signed)
[facility]/Trauma Progress Note  1 Day Post-Op   Assessment/Plan Active Problems:   CVA (cerebral vascular accident) ([place]) - received tPA   Gastrointestinal hemorrhage   Malignant neoplasm of descending colon ([place])  Hx of A. Fib Hx of BL LE DVT HTN  L radical nephrectomy by Dr. Diona Fanti 2012   Lower GI bleed Large polyp in the descending colon, concerning for malignancy - Large partially obstructing polypoid mass in the descending colon, tattoo'd and biopsied; unable to traverse with a scope. EGD showed no etiology for her GI bleed. - CEA pending - CT chest/abd/pelvis showed Extensive, large volume peritoneal/omental metastasis, + adendopathy  - Hgb 10.8, VSS - patient would need to be cleared/optimized by cardiology and neurology prior to any surgical intervention - ok with starting lovenox  - if she has continued GI bleeding would need to consider surgery sooner   FEN: reg diet per GI  VTE: SCDs, ok to start lovenox from a surgical standpoint, Neuro recs? ID: no current abx Follow up: TBD  Plan: No obstructive symptoms. No emergent surgery at this time. Multidisciplinary discussions about plans moving forward and risk/benefit of timing of surgery as more information becomes available. We will continue to follow.    LOS: 5 days    Subjective:  CC; hungry  Pt wants food. She denies abdominal pain, nausea, vomiting. No BM since bowel prep. No blood per rectum. No new complaints.   Objective: Vital signs in last 24 hours: Temp:  [97.7 F (36.5 C)-98.6 F (37 C)] 98.3 F (36.8 C) (10/20 0511) Pulse Rate:  [65-115] 88 (10/20 0511) Resp:  [13-26] 20 (10/20 0511) BP: (102-145)/(60-89) 106/60 (10/20 0511) SpO2:  [98 %-100 %] 98 % (10/20 0511) Weight:  [179 lb (81.2 kg)] 179 lb (81.2 kg) (10/19 1000) Last BM Date: 05/28/17  Intake/Output from previous day: 10/19 0701 - 10/20 0700 In: 1100 [P.O.:600; I.V.:500] Out: 4 [Urine:2; Stool:2] Intake/Output  this shift: No intake/output data recorded.  PE: Gen:  Alert, NAD Card:  RRR, no M/G/R heard Pulm:  Rate and effort normal Abd: Soft, ND, +BS, mild TTP in LUQ with small firm mass noted in area of TTP Skin: no rashes noted, warm and dry   Anti-infectives: Anti-infectives    None      Lab Results:   Recent Labs  05/28/17 1220  WBC 9.3  HGB 10.8*  HCT 33.9*  PLT 238   BMET No results for input(s): NA, K, CL, CO2, GLUCOSE, BUN, CREATININE, CALCIUM in the last 72 hours. PT/INR No results for input(s): LABPROT, INR in the last 72 hours. CMP     Component Value Date/Time   NA 137 05/26/2017 0459   K 4.1 05/26/2017 0459   CL 114 (H) 05/26/2017 0459   CO2 16 (L) 05/26/2017 0459   GLUCOSE 100 (H) 05/26/2017 0459   BUN 35 (H) 05/26/2017 0459   CREATININE 2.67 (H) 05/26/2017 0459   CALCIUM 9.6 05/26/2017 0459   PROT 7.0 05/24/2017 1320   ALBUMIN 3.4 (L) 05/24/2017 1320   AST 20 05/24/2017 1320   ALT 13 (L) 05/24/2017 1320   ALKPHOS 105 05/24/2017 1320   BILITOT 0.4 05/24/2017 1320   GFRNONAA 16 (L) 05/26/2017 0459   GFRAA 18 (L) 05/26/2017 0459   Lipase  No results found for: LIPASE  Studies/Results: Ct Abdomen Pelvis Wo Contrast  Result Date: 05/28/2017 CLINICAL DATA:  Colon cancer staging. Elevated creatinine. Malignant neoplasm of descending colon. EXAM: CT CHEST, ABDOMEN AND PELVIS WITHOUT CONTRAST  TECHNIQUE: Multidetector CT imaging of the chest, abdomen and pelvis was performed following the standard protocol without IV contrast. COMPARISON:  12/14/2011 abdominopelvic CT. Chest radiograph 05/19/2005 reviewed. FINDINGS: CT CHEST FINDINGS Cardiovascular: Tortuous thoracic aorta. Aortic atherosclerosis. Normal heart size, without pericardial effusion. Multivessel coronary artery atherosclerosis. Mediastinum/Nodes: No mediastinal or definite hilar adenopathy, given limitations of unenhanced CT. Lungs/Pleura: No pleural fluid. Bilateral pulmonary nodules. Index right  middle lobe nodule measures 2.0 cm on image 63/series 5. An index left upper lobe 2.0 cm nodule on image 52/series 5. Other smaller bilateral pulmonary nodules. Musculoskeletal: No acute osseous abnormality. CT ABDOMEN PELVIS FINDINGS Hepatobiliary: Irregular hepatic capsule, favored to be related to the capsular metastasis. No convincing evidence of intraparenchymal liver lesions. Normal gallbladder, without biliary ductal dilatation. Pancreas: Normal pancreas for age. Spleen: Normal in size, without focal abnormality. Adrenals/Urinary Tract: Normal right adrenal gland. Left adrenal thickening. Left nephrectomy. Multiple renal lesions. hyperattenuating lesions including the upper pole 2.4 cm in 42 HU on image 57/series 3, new. No hydronephrosis. Normal urinary bladder. Stomach/Bowel: Normal stomach, without wall thickening. Extensive colonic diverticulosis. Possible soft tissue fullness in the region of the splenic flexure, including on image 56/series 3. No obstruction. Normal small bowel caliber. Vascular/Lymphatic: Aortic and branch vessel atherosclerosis. Borderline enlarged left periaortic node of 10 mm on image 68/series 3, new. Probable left external iliac adenopathy at 1.5 cm on image 110 100/series 3. Reproductive: Uterus not well evaluated. Other: Small 2 moderate volume abdominopelvic fluid with extensive omental/ peritoneal metastasis. An index area of left abdominal omental nodularity measures on the order of 3.1 cm on image 74/series 3. A combination of right omental nodularity and fluid measures maximally 5.7 cm on image 81/series 3. Musculoskeletal: Bilateral hip osteoarthritis. IMPRESSION: CT CHEST IMPRESSION 1. Bilateral pulmonary nodules, consistent with metastatic disease. 2. No thoracic adenopathy. 3. Coronary artery atherosclerosis. Aortic Atherosclerosis (ICD10-I70.0). CT ABDOMEN AND PELVIS IMPRESSION 1. Extensive, large volume peritoneal/omental metastasis. This is presumably related to the  primary colon mass. A synchronous metastatic ovarian carcinoma or primary peritoneal carcinoma could look similar. 2. New borderline abdominal and probable left external iliac adenopathy, suspicious for nodal metastasis. 3. Left nephrectomy. Multiple indeterminate right renal lesions which could represent complex cysts or 1 or more solid neoplasms. Of questionable clinical significance, given comorbidities. If further evaluation is desired, outpatient pre and post contrast abdominal MRI or renal protocol CT could be performed. 4. No bowel obstruction or other acute complication. Electronically Signed   By: Abigail Miyamoto M.D.   On: 05/28/2017 20:35   Ct Chest Wo Contrast  Result Date: 05/28/2017 CLINICAL DATA:  Colon cancer staging. Elevated creatinine. Malignant neoplasm of descending colon. EXAM: CT CHEST, ABDOMEN AND PELVIS WITHOUT CONTRAST TECHNIQUE: Multidetector CT imaging of the chest, abdomen and pelvis was performed following the standard protocol without IV contrast. COMPARISON:  12/14/2011 abdominopelvic CT. Chest radiograph 05/19/2005 reviewed. FINDINGS: CT CHEST FINDINGS Cardiovascular: Tortuous thoracic aorta. Aortic atherosclerosis. Normal heart size, without pericardial effusion. Multivessel coronary artery atherosclerosis. Mediastinum/Nodes: No mediastinal or definite hilar adenopathy, given limitations of unenhanced CT. Lungs/Pleura: No pleural fluid. Bilateral pulmonary nodules. Index right middle lobe nodule measures 2.0 cm on image 63/series 5. An index left upper lobe 2.0 cm nodule on image 52/series 5. Other smaller bilateral pulmonary nodules. Musculoskeletal: No acute osseous abnormality. CT ABDOMEN PELVIS FINDINGS Hepatobiliary: Irregular hepatic capsule, favored to be related to the capsular metastasis. No convincing evidence of intraparenchymal liver lesions. Normal gallbladder, without biliary ductal dilatation. Pancreas: Normal pancreas for age. Spleen:  Normal in size, without focal  abnormality. Adrenals/Urinary Tract: Normal right adrenal gland. Left adrenal thickening. Left nephrectomy. Multiple renal lesions. hyperattenuating lesions including the upper pole 2.4 cm in 42 HU on image 57/series 3, new. No hydronephrosis. Normal urinary bladder. Stomach/Bowel: Normal stomach, without wall thickening. Extensive colonic diverticulosis. Possible soft tissue fullness in the region of the splenic flexure, including on image 56/series 3. No obstruction. Normal small bowel caliber. Vascular/Lymphatic: Aortic and branch vessel atherosclerosis. Borderline enlarged left periaortic node of 10 mm on image 68/series 3, new. Probable left external iliac adenopathy at 1.5 cm on image 110 100/series 3. Reproductive: Uterus not well evaluated. Other: Small 2 moderate volume abdominopelvic fluid with extensive omental/ peritoneal metastasis. An index area of left abdominal omental nodularity measures on the order of 3.1 cm on image 74/series 3. A combination of right omental nodularity and fluid measures maximally 5.7 cm on image 81/series 3. Musculoskeletal: Bilateral hip osteoarthritis. IMPRESSION: CT CHEST IMPRESSION 1. Bilateral pulmonary nodules, consistent with metastatic disease. 2. No thoracic adenopathy. 3. Coronary artery atherosclerosis. Aortic Atherosclerosis (ICD10-I70.0). CT ABDOMEN AND PELVIS IMPRESSION 1. Extensive, large volume peritoneal/omental metastasis. This is presumably related to the primary colon mass. A synchronous metastatic ovarian carcinoma or primary peritoneal carcinoma could look similar. 2. New borderline abdominal and probable left external iliac adenopathy, suspicious for nodal metastasis. 3. Left nephrectomy. Multiple indeterminate right renal lesions which could represent complex cysts or 1 or more solid neoplasms. Of questionable clinical significance, given comorbidities. If further evaluation is desired, outpatient pre and post contrast abdominal MRI or renal protocol CT  could be performed. 4. No bowel obstruction or other acute complication. Electronically Signed   By: Abigail Miyamoto M.D.   On: 05/28/2017 20:35      Kalman Drape , [facility] 05/29/2017, 8:49 AM Pager: [phone] Consults: [phone] Mon-Fri 7:00 am-4:30 pm Sat-Sun 7:00 am-11:30 am

## 2017-05-29 NOTE — Progress Notes (Signed)
[facility] Progress Note    Since last GI note: Colonoscopy yesterday, see full report in EPIC.  Large, soft tumor in descending colon.  CT scan chest, abd, pelvis: widespread peritoneal and omental disease, also pulmonary nodules that are likely mets.  Objective: Vital signs in last 24 hours: Temp:  [97.7 F (36.5 C)-98.6 F (37 C)] 98.3 F (36.8 C) (10/20 0511) Pulse Rate:  [65-115] 88 (10/20 0511) Resp:  [13-26] 20 (10/20 0511) BP: (102-145)/(60-89) 106/60 (10/20 0511) SpO2:  [98 %-100 %] 98 % (10/20 0511) Weight:  [179 lb (81.2 kg)] 179 lb (81.2 kg) (10/19 1000) Last BM Date: 05/28/17 General: alert and oriented times 3 Heart: regular rate and rythm Abdomen: soft, non-tender, non-distended, normal bowel sounds   Lab Results:  Recent Labs  05/28/17 1220  WBC 9.3  HGB 10.8*  PLT 238  MCV 83.7  Medications: Scheduled Meds: .  stroke: mapping our early stages of recovery book   Does not apply Once  . aspirin EC  81 mg Oral Daily  . latanoprost  1 drop Both Eyes QHS  . rosuvastatin  5 mg Oral Daily   Continuous Infusions: PRN Meds:.acetaminophen **OR** acetaminophen (TYLENOL) oral liquid 160 mg/5 mL **OR** acetaminophen, senna-docusate    Assessment/Plan: 82 y.o. female with widely metastatic cancer, unknown primary  The colon tumor MAY NOT represent primary colon cancer.  Certainly spread pattern (without liver involvement) is a bit unusual and the tumor itself was softer than is typical for colon cancer.  Biopsies will probably be back on Monday.    She has not had obstructive symptoms so no urgency for colon surgery.  However I am certain she will have rebleed if she is put back on blood thinners and so that should be avoided.  May benefit from a IVC filter (for current DVTs in right leg). I will defer that decision to primary team.  She will need oncology input, can wait till path is back.  Please call or page with any further questions or  concerns.    Milus Banister, MD  05/29/2017, 7:48 AM [facility] Pager [phone]

## 2017-05-29 NOTE — Progress Notes (Signed)
PT Cancellation Note  Patient Details Name: Shawna Schneider MRN: [id number] DOB: 1935/02/23   Cancelled Treatment:    Reason Eval/Treat Not Completed: Medical issues which prohibited therapy (patient continues to have multiple DVTs and has not yet restarted anticoagulation nor has she had a filter placed) At this time, will continue to await clearance QI:HKVQ.   Duncan Dull 05/29/2017, 11:27 AM Alben Deeds, PT DPT  Board Certified Neurologic Specialist [phone]

## 2017-05-29 NOTE — Progress Notes (Signed)
STROKE TEAM PROGRESS NOTE   SUBJECTIVE (INTERVAL HISTORY) Son at bedside. She  had TCD bubble study done yesterday but has only a small PFO unlikely to be of clinical significance  No more LGI bleeding. She has met with GI Dr Ardis Hughs and discussed about pan-CT findings.  She is waiting for the biopsy on Monday to make further decisions.  OBJECTIVE CBC:  Recent Labs Lab 05/24/17 1320  05/26/17 0459 05/28/17 1220  WBC 8.2  < > 9.2 9.3  NEUTROABS 5.8  --   --  6.4  HGB 9.1*  < > 10.0* 10.8*  HCT 28.9*  < > 31.1* 33.9*  MCV 82.3  < > 82.3 83.7  PLT 246  < > 218 238  < > = values in this interval not displayed.  Basic Metabolic Panel:   Recent Labs Lab 05/24/17 1320 05/24/17 1329 05/26/17 0459  NA 138 140 137  K 3.8 3.9 4.1  CL 112* 114* 114*  CO2 16*  --  16*  GLUCOSE 90 85 100*  BUN 37* 35* 35*  CREATININE 3.22* 3.20* 2.67*  CALCIUM 10.5*  --  9.6    Lipid Panel:     Component Value Date/Time   CHOL 94 05/24/2017 2355   TRIG 89 05/24/2017 2355   HDL 28 (L) 05/24/2017 2355   CHOLHDL 3.4 05/24/2017 2355   VLDL 18 05/24/2017 2355   LDLCALC 48 05/24/2017 2355   HgbA1c:  Lab Results  Component Value Date   HGBA1C 6.5 (H) 05/24/2017   Urine Drug Screen: No results found for: LABOPIA, COCAINSCRNUR, LABBENZ, AMPHETMU, THCU, LABBARB  Alcohol Level No results found for: [place] I have personally reviewed the radiological images below and agree with the radiology interpretations.  Mr Brain Wo Contrast 05/24/2017 1. Small region of acute/early subacute infarction within the left posterior insula extending into left frontoparietal junction. 2. Additional scattered punctate foci of reduced diffusion are present in the bilateral convexities and cerebellar hemispheres. Multiple vascular territories suggests embolic phenomenon. 3. Late subacute to chronic infarct within left occipital lobe. 4. Moderate chronic microvascular ischemic changes and moderate parenchymal  volume loss of the brain.   Mr Angiogram Head Wo Contrast 05/24/2017 1. Poor flow related signal within left MCA M2 inferior sylvian division likely representing thromboembolic occlusion. 2. Otherwise patent circle of Willis. No aneurysm or high-grade stenosis.  Ct Head Code Stroke Wo Contrast` 05/24/2017 IMPRESSION:  1. No acute intracranial abnormality.  2. ASPECTS is 10/10  CT CHEST WO Contrast 05/28/2017 1. Bilateral pulmonary nodules, consistent with metastatic disease. 2. No thoracic adenopathy. 3. Coronary artery atherosclerosis. Aortic Atherosclerosis  CT ABDOMEN AND PELVIS WO Contrast 05/28/2017 1. Extensive, large volume peritoneal/omental metastasis.This is presumably related to the primary colon mass.  A synchronous metastatic ovarian carcinoma or primary peritoneal carcinoma could look similar. 2. New borderline abdominal and probable left external iliac adenopathy, suspicious for nodal metastasis. 3. Left nephrectomy. Multiple indeterminate right renal lesions which could represent complex cysts or 1 or more solid neoplasms. Of questionable clinical significance, given comorbidities. If further evaluation is desired, outpatient pre and post contrast abdominal MRI or renal protocol CT could be performed. 4. No bowel obstruction or other acute complication.  Transthoracic Echocardiogram  05/26/2017 Study Conclusions - Left ventricle: The cavity size was normal. Wall thickness was   normal. Systolic function was normal. The estimated ejection   fraction was in the range of 55% to 60%. Wall motion was normal;   there were no regional wall  motion abnormalities. Doppler   parameters are consistent with abnormal left ventricular   relaxation (grade 1 diastolic dysfunction). - Aortic valve: There was trivial regurgitation. Impressions: - Normal LV systolic function; mild diastolic dysfunction;   sclerotic aortic valve with trace AI.  Bilateral Carotid Dopplers   05/25/2017 Summary: The vertebral arteries appear patent with antegrade flow. Findings consistent with 1-39 percent stenosis involving theright internal carotid artery and the left internal carotid artery.  TCD with bubbles  05/26/2017 HITS heard at rest and during Valsalva. Small PFO.  Bilateral Lower Extremity Venous Ultrasound 05/25/2017 Summary: - Findings consistent with acute deep vein thrombosis involving the right popliteal vein, right posterial tibial vein, and left posterial tibial vein. - As well as intramuscular thrombosis of the right gastrocnemiusveins.   PHYSICAL EXAM  Temp:  [97.7 F (36.5 C)-98.6 F (37 C)] 98.3 F (36.8 C) (10/20 0511) Pulse Rate:  [65-115] 88 (10/20 0511) Resp:  [13-26] 20 (10/20 0511) BP: (102-145)/(60-89) 106/60 (10/20 0511) SpO2:  [98 %-100 %] 98 % (10/20 0511) Weight:  [179 lb (81.2 kg)] 179 lb (81.2 kg) (10/19 1000)   Vitals:   05/28/17 2102 05/29/17 0047 05/29/17 0511 05/29/17 0948  BP: 122/71 119/67 106/60 115/79  Pulse: 91 97 88 86  Resp: 18 20 20 20   Temp: 98.3 F (36.8 C) 97.9 F (36.6 C) 98.3 F (36.8 C) 98.8 F (37.1 C)  TempSrc: Oral Oral Oral Oral  SpO2: 100% 100% 98% 100%  Weight:      Height:        General - Well nourished, well developedelderly lady, in no apparent distress.  Cardiovascular - Regular rate and rhythm.  Mental Status -  Level of arousal and orientation to time, place, and person were intact. Language including expression, naming, repetition, comprehension was intact.  Cranial Nerves II - XII - II - Visual field intact OU. III, IV, VI - Extraocular movements intact. V - Facial sensation intact bilaterally. VII - Facial movement intact bilaterally. VIII - Hearing & vestibular intact bilaterally. X - Palate elevates symmetrically. XI - Chin turning & shoulder shrug intact bilaterally. XII - Tongue protrusion intact.  Motor Strength - The patient's strength was normal in all extremities  and pronator drift was absent.   Motor Tone - Muscle tone was assessed at the neck and appendages and was normal.  Reflexes - The patient's reflexes were 1+ in all extremities and she had no pathological reflexes.  Sensory - Light touch was assessed and was symmetrical.    Coordination - The patient had normal movements in the hands and feet with no ataxia or dysmetria.  Tremor was absent.  Gait and Station - deferred    ASSESSMENT/PLAN Ms. Shawna Schneider is a 82 y.o. female with history of hypertension, hyperlipidemia, diverticulosis, remote nephrectomy presenting with new onset dysarthria and expressive aphasia. She received tPA on Monday 10/15 at 1511.   Stroke:  Left insula infarct, bilateral ACA and cerebellar punctate infarcts, embolic pattern, most likely due to hypercoagulable state secondary to advanced malignancy   Resultant no significant deficit  CT head No acute abnormality  MRI head - Small region of acute/early subacute infarction within the left posterior insula extending into left frontoparietal junction, scattered punctate foci of reduced diffusion in the bilateral convexities and cerebellar hemispheres.  MRA head - Poor flow related signal within left MCA M2 inferior sylvian division likely representing thromboembolic occlusion  Carotid Doppler - no significant stenosis  2D Echo - EF 55-60%. No cardiac  source of emboli identified.  TCD with bubbles - small PFO  (likely clinically insignificant)  LE venous Doppler- acute DVT bilaterally  LDL 48  HgbA1c 6.5%  SCDs for VTE prophylaxis Diet clear liquid Room service appropriate? Yes; Fluid consistency: Thin  No antithrombotic prior to admission, now on No antithrombotic.  Patient currently refused anticoagulation, would like to make further decision on Monday based on biopsy results.  Patient counseled to be compliant with her antithrombotic medications  Ongoing aggressive stroke risk factor  management  Therapy recommendations:  Pending  Disposition:  Pending  Advanced malignancy with widespread metastasis  GIB post tPA  Colonoscopy showed colon mass  Pan CT -wide spread metastasis  Colon mass biopsy pending  Ideally pt should be anticoagulated for hypercoagulable state and for DVT and stroke prevention.  However, patient had GI bleeding after TPA, presumably high risk for continued bleeding with anticoagulation.  Patient would like to make decision after biopsy results.  Bilateral DVT  LE venous Doppler confirmed bilateral DVT  Consistent with hypercoagulable state secondary to advanced malignancy  Patient so far refused anti-correlation and would like to make further decision on Monday based on biopsy results  May consider IVC filter if not on anticoagulation.  Patient would like to make decision on Monday with biopsies without  Lower GI bleeding  s/p TPA  Hx of diveritculosis   Hgb 7.5->10.3 after 2 units pRBCs  CBC monitoring, stable  Hypertension  Stable  BP goal normotensive  Hyperlipidemia  Home meds: gemfibrozil  LDL 48, goal < 70  No statin at this time given low LDL and abdominal metastasis  Other Stroke Risk Factors  Advanced age  Obesity, Body mass index is 31.71 kg/m., recommend weight loss, diet and exercise as appropriate   Other Active Problems  S/p nephrectomy   Hospital day # 5  I spent  35 minutes in total face-to-face time with the patient, more than 50% of which was spent in counseling and coordination of care, reviewing test results, images and medication, and discussing the diagnosis of stroke, DVT, colon cancer with widespread metastasis, treatment plan and potential prognosis. This patient's care requiresreview of multiple databases, neurological assessment, discussion with family, other specialists and medical decision making of high complexity.   [name], MD PhD Stroke Neurology 05/29/2017 4:14 PM  To  contact Stroke Continuity provider, please refer to [email or web address] After hours, contact General Neurology

## 2017-05-30 ENCOUNTER — Encounter (HOSPITAL_COMMUNITY): Payer: Self-pay | Admitting: Gastroenterology

## 2017-05-30 NOTE — Progress Notes (Signed)
STROKE TEAM PROGRESS NOTE   SUBJECTIVE (INTERVAL HISTORY) No family is at bedside. Pt lying in bed, no acute distress and no acute event overnight. Seems in depressed mood but answer all questions appropriately and voiced no help needed at this time.   OBJECTIVE CBC:  Recent Labs Lab 05/24/17 1320  05/26/17 0459 05/28/17 1220  WBC 8.2  < > 9.2 9.3  NEUTROABS 5.8  --   --  6.4  HGB 9.1*  < > 10.0* 10.8*  HCT 28.9*  < > 31.1* 33.9*  MCV 82.3  < > 82.3 83.7  PLT 246  < > 218 238  < > = values in this interval not displayed.  Basic Metabolic Panel:   Recent Labs Lab 05/24/17 1320 05/24/17 1329 05/26/17 0459  NA 138 140 137  K 3.8 3.9 4.1  CL 112* 114* 114*  CO2 16*  --  16*  GLUCOSE 90 85 100*  BUN 37* 35* 35*  CREATININE 3.22* 3.20* 2.67*  CALCIUM 10.5*  --  9.6    Lipid Panel:     Component Value Date/Time   CHOL 94 05/24/2017 2355   TRIG 89 05/24/2017 2355   HDL 28 (L) 05/24/2017 2355   CHOLHDL 3.4 05/24/2017 2355   VLDL 18 05/24/2017 2355   LDLCALC 48 05/24/2017 2355   HgbA1c:  Lab Results  Component Value Date   HGBA1C 6.5 (H) 05/24/2017   Urine Drug Screen: No results found for: LABOPIA, COCAINSCRNUR, LABBENZ, AMPHETMU, THCU, LABBARB  Alcohol Level No results found for: [place] I have personally reviewed the radiological images below and agree with the radiology interpretations.  Mr Brain Wo Contrast 05/24/2017 1. Small region of acute/early subacute infarction within the left posterior insula extending into left frontoparietal junction. 2. Additional scattered punctate foci of reduced diffusion are present in the bilateral convexities and cerebellar hemispheres. Multiple vascular territories suggests embolic phenomenon. 3. Late subacute to chronic infarct within left occipital lobe. 4. Moderate chronic microvascular ischemic changes and moderate parenchymal volume loss of the brain.   Mr Angiogram Head Wo Contrast 05/24/2017 1. Poor flow  related signal within left MCA M2 inferior sylvian division likely representing thromboembolic occlusion. 2. Otherwise patent circle of Willis. No aneurysm or high-grade stenosis.  Ct Head Code Stroke Wo Contrast` 05/24/2017 IMPRESSION:  1. No acute intracranial abnormality.  2. ASPECTS is 10/10  CT CHEST WO Contrast 05/28/2017 1. Bilateral pulmonary nodules, consistent with metastatic disease. 2. No thoracic adenopathy. 3. Coronary artery atherosclerosis. Aortic Atherosclerosis  CT ABDOMEN AND PELVIS WO Contrast 05/28/2017 1. Extensive, large volume peritoneal/omental metastasis.This is presumably related to the primary colon mass.  A synchronous metastatic ovarian carcinoma or primary peritoneal carcinoma could look similar. 2. New borderline abdominal and probable left external iliac adenopathy, suspicious for nodal metastasis. 3. Left nephrectomy. Multiple indeterminate right renal lesions which could represent complex cysts or 1 or more solid neoplasms. Of questionable clinical significance, given comorbidities. If further evaluation is desired, outpatient pre and post contrast abdominal MRI or renal protocol CT could be performed. 4. No bowel obstruction or other acute complication.  Transthoracic Echocardiogram  05/26/2017 Study Conclusions - Left ventricle: The cavity size was normal. Wall thickness was   normal. Systolic function was normal. The estimated ejection   fraction was in the range of 55% to 60%. Wall motion was normal;   there were no regional wall motion abnormalities. Doppler   parameters are consistent with abnormal left ventricular   relaxation (grade 1 diastolic  dysfunction). - Aortic valve: There was trivial regurgitation. Impressions: - Normal LV systolic function; mild diastolic dysfunction;   sclerotic aortic valve with trace AI.  Bilateral Carotid Dopplers  05/25/2017 Summary: The vertebral arteries appear patent with antegrade flow. Findings  consistent with 1-39 percent stenosis involving theright internal carotid artery and the left internal carotid artery.  TCD with bubbles  05/26/2017 HITS heard at rest and during Valsalva. Small PFO.  Bilateral Lower Extremity Venous Ultrasound 05/25/2017 Summary: - Findings consistent with acute deep vein thrombosis involving the right popliteal vein, right posterial tibial vein, and left posterial tibial vein. - As well as intramuscular thrombosis of the right gastrocnemiusveins.   PHYSICAL EXAM  Temp:  [98.5 F (36.9 C)-98.9 F (37.2 C)] 98.9 F (37.2 C) (10/21 0600) Pulse Rate:  [86-104] 101 (10/21 0600) Resp:  [18-20] 18 (10/21 0600) BP: (101-116)/(68-79) 115/75 (10/21 0600) SpO2:  [96 %-100 %] 96 % (10/21 0600)   Vitals:   05/29/17 1416 05/29/17 1754 05/29/17 2139 05/30/17 0600  BP: 101/68 115/79 116/75 115/75  Pulse: 97 (!) 101 (!) 104 (!) 101  Resp:  18 18 18   Temp: 98.6 F (37 C) 98.6 F (37 C) 98.5 F (36.9 C) 98.9 F (37.2 C)  TempSrc: Oral Oral Oral Oral  SpO2: 98% 100% 99% 96%  Weight:      Height:        General - Well nourished, well developedelderly lady, in no apparent distress.  Cardiovascular - Regular rate and rhythm.  Mental Status -  Level of arousal and orientation to time, place, and person were intact. Language including expression, naming, repetition, comprehension was intact.  Cranial Nerves II - XII - II - Visual field intact OU. III, IV, VI - Extraocular movements intact. V - Facial sensation intact bilaterally. VII - Facial movement intact bilaterally. VIII - Hearing & vestibular intact bilaterally. X - Palate elevates symmetrically. XI - Chin turning & shoulder shrug intact bilaterally. XII - Tongue protrusion intact.  Motor Strength - The patient's strength was normal in all extremities and pronator drift was absent.   Motor Tone - Muscle tone was assessed at the neck and appendages and was normal.  Reflexes - The  patient's reflexes were 1+ in all extremities and she had no pathological reflexes.  Sensory - Light touch was assessed and was symmetrical.    Coordination - The patient had normal movements in the hands and feet with no ataxia or dysmetria.  Tremor was absent.  Gait and Station - deferred    ASSESSMENT/PLAN Ms. RAVEN FURNAS is a 82 y.o. female with history of hypertension, hyperlipidemia, diverticulosis, remote nephrectomy presenting with new onset dysarthria and expressive aphasia. She received tPA on Monday 10/15 at 1511.   Stroke:  Left insula infarct, bilateral ACA and cerebellar punctate infarcts, embolic pattern, most likely due to hypercoagulable state secondary to advanced malignancy   Resultant no significant deficit  CT head No acute abnormality  MRI head - Small region of acute/early subacute infarction within the left posterior insula extending into left frontoparietal junction, scattered punctate foci of reduced diffusion in the bilateral convexities and cerebellar hemispheres.  MRA head - Poor flow related signal within left MCA M2 inferior sylvian division likely representing thromboembolic occlusion  Carotid Doppler - no significant stenosis  2D Echo - EF 55-60%. No cardiac source of emboli identified.  TCD with bubbles - small PFO  (likely clinically insignificant)  LE venous Doppler- acute DVT bilaterally  LDL 48  HgbA1c  6.5%  SCDs for VTE prophylaxis Diet regular Room service appropriate? Yes; Fluid consistency: Thin  No antithrombotic prior to admission, now on No antithrombotic.  Patient currently refused anticoagulation, would like to make further decision on Monday based on biopsy results.  Patient counseled to be compliant with her antithrombotic medications  Ongoing aggressive stroke risk factor management  Therapy recommendations:  Pending  Disposition:  Pending  Advanced malignancy with widespread metastasis  GIB post  tPA  Colonoscopy showed colon mass  Pan CT -wide spread metastasis  Colon mass biopsy pending  Ideally pt should be anticoagulated for hypercoagulable state and for DVT and stroke prevention.  However, patient had GI bleeding after TPA, presumably high risk for continued bleeding with anticoagulation.  Patient would like to make decision after biopsy results.  Bilateral DVT  LE venous Doppler confirmed bilateral DVT  Consistent with hypercoagulable state secondary to advanced malignancy  Patient so far refused anti-correlation and would like to make further decision on Monday based on biopsy results  May consider IVC filter if not on anticoagulation.  Patient would like to make decision on Monday with biopsies without  Lower GI bleeding  s/p TPA  Hx of diveritculosis   No more bleeding since  Hgb 7.5->2 units pRBCs->10.3->10.8  CBC monitoring, stable  Hypertension  Stable  BP goal normotensive  Hyperlipidemia  Home meds: gemfibrozil  LDL 48, goal < 70  No statin at this time given low LDL and abdominal metastasis  Other Stroke Risk Factors  Advanced age  Obesity, Body mass index is 31.71 kg/m., recommend weight loss, diet and exercise as appropriate   Other Active Problems  S/p nephrectomy   Hospital day # 6  [name], MD PhD Stroke Neurology 05/30/2017 11:58 AM   To contact Stroke Continuity provider, please refer to [email or web address] After hours, contact General Neurology

## 2017-05-30 NOTE — Progress Notes (Signed)
[facility]/Trauma Progress Note  2 Days Post-Op   Assessment/Plan Active Problems:   CVA (cerebral vascular accident) ([place]) - received tPA   Gastrointestinal hemorrhage   Malignant neoplasm of descending colon ([place])  Hx of A. Fib Hx of BL LE DVT HTN  L radical nephrectomy by Dr. Diona Fanti 2012   Lower GI bleed Large polyp in the descending colon, concerning for malignancy - Large partially obstructing polypoid mass in the descending colon, tattoo'd and biopsied; unable to traverse with a scope. EGD showed no etiology for her GI bleed. - CEA 39.2 - CT chest/abd/pelvis showed Extensive, large volume peritoneal/omental metastasis, + adendopathy  - Hgb 10.8 10/19, VSS, AM CBC - patient would need to be cleared/optimized by cardiology and neurology prior to any surgical intervention - ok with starting lovenox  - if she has continued GI bleeding would need to consider surgery sooner   DVT R popliteal, R posterior tibial, & L posterior tibial veins - Pt has refused anti-coagulation and would like to wait until Monday to make a decision based on biopsy results - Will hold off on any surgical plans until chemical anticoagulation or filter is implemented    FEN: reg diet per GI  VTE: SCDs, ok to start lovenox from a surgical standpoint but pt is refusing ID: no current abx Follow up: TBD  Plan: No obstructive symptoms. No emergent surgery at this time. Multidisciplinary discussions about plans moving forward and risk/benefit of timing of surgery as more information becomes available. We will continue to follow.    LOS: 6 days    Subjective: CC; GIB  Pt had a soft loose BM today without blood. No nausea or vomiting. Tolerating a regular diet. No abdominal pain.  Objective: Vital signs in last 24 hours: Temp:  [98.5 F (36.9 C)-98.9 F (37.2 C)] 98.9 F (37.2 C) (10/21 0600) Pulse Rate:  [86-104] 101 (10/21 0600) Resp:  [18-20] 18 (10/21 0600) BP:  (101-116)/(68-79) 115/75 (10/21 0600) SpO2:  [96 %-100 %] 96 % (10/21 0600) Last BM Date: 05/28/17  Intake/Output from previous day: 10/20 0701 - 10/21 0700 In: 120 [P.O.:120] Out: -  Intake/Output this shift: Total I/O In: 120 [P.O.:120] Out: -   PE: Gen:  Alert, NAD, pleasant, cooperative Pulm:  Rate and effort normal Abd: Soft, ND, +BS, noTTP Skin: no rashes noted, warm and dry    Anti-infectives: Anti-infectives    None      Lab Results:   Recent Labs  05/28/17 1220  WBC 9.3  HGB 10.8*  HCT 33.9*  PLT 238   BMET No results for input(s): NA, K, CL, CO2, GLUCOSE, BUN, CREATININE, CALCIUM in the last 72 hours. PT/INR No results for input(s): LABPROT, INR in the last 72 hours. CMP     Component Value Date/Time   NA 137 05/26/2017 0459   K 4.1 05/26/2017 0459   CL 114 (H) 05/26/2017 0459   CO2 16 (L) 05/26/2017 0459   GLUCOSE 100 (H) 05/26/2017 0459   BUN 35 (H) 05/26/2017 0459   CREATININE 2.67 (H) 05/26/2017 0459   CALCIUM 9.6 05/26/2017 0459   PROT 7.0 05/24/2017 1320   ALBUMIN 3.4 (L) 05/24/2017 1320   AST 20 05/24/2017 1320   ALT 13 (L) 05/24/2017 1320   ALKPHOS 105 05/24/2017 1320   BILITOT 0.4 05/24/2017 1320   GFRNONAA 16 (L) 05/26/2017 0459   GFRAA 18 (L) 05/26/2017 0459   Lipase  No results found for: LIPASE  Studies/Results: Ct Abdomen Pelvis Wo  Contrast  Result Date: 05/28/2017 CLINICAL DATA:  Colon cancer staging. Elevated creatinine. Malignant neoplasm of descending colon. EXAM: CT CHEST, ABDOMEN AND PELVIS WITHOUT CONTRAST TECHNIQUE: Multidetector CT imaging of the chest, abdomen and pelvis was performed following the standard protocol without IV contrast. COMPARISON:  12/14/2011 abdominopelvic CT. Chest radiograph 05/19/2005 reviewed. FINDINGS: CT CHEST FINDINGS Cardiovascular: Tortuous thoracic aorta. Aortic atherosclerosis. Normal heart size, without pericardial effusion. Multivessel coronary artery atherosclerosis.  Mediastinum/Nodes: No mediastinal or definite hilar adenopathy, given limitations of unenhanced CT. Lungs/Pleura: No pleural fluid. Bilateral pulmonary nodules. Index right middle lobe nodule measures 2.0 cm on image 63/series 5. An index left upper lobe 2.0 cm nodule on image 52/series 5. Other smaller bilateral pulmonary nodules. Musculoskeletal: No acute osseous abnormality. CT ABDOMEN PELVIS FINDINGS Hepatobiliary: Irregular hepatic capsule, favored to be related to the capsular metastasis. No convincing evidence of intraparenchymal liver lesions. Normal gallbladder, without biliary ductal dilatation. Pancreas: Normal pancreas for age. Spleen: Normal in size, without focal abnormality. Adrenals/Urinary Tract: Normal right adrenal gland. Left adrenal thickening. Left nephrectomy. Multiple renal lesions. hyperattenuating lesions including the upper pole 2.4 cm in 42 HU on image 57/series 3, new. No hydronephrosis. Normal urinary bladder. Stomach/Bowel: Normal stomach, without wall thickening. Extensive colonic diverticulosis. Possible soft tissue fullness in the region of the splenic flexure, including on image 56/series 3. No obstruction. Normal small bowel caliber. Vascular/Lymphatic: Aortic and branch vessel atherosclerosis. Borderline enlarged left periaortic node of 10 mm on image 68/series 3, new. Probable left external iliac adenopathy at 1.5 cm on image 110 100/series 3. Reproductive: Uterus not well evaluated. Other: Small 2 moderate volume abdominopelvic fluid with extensive omental/ peritoneal metastasis. An index area of left abdominal omental nodularity measures on the order of 3.1 cm on image 74/series 3. A combination of right omental nodularity and fluid measures maximally 5.7 cm on image 81/series 3. Musculoskeletal: Bilateral hip osteoarthritis. IMPRESSION: CT CHEST IMPRESSION 1. Bilateral pulmonary nodules, consistent with metastatic disease. 2. No thoracic adenopathy. 3. Coronary artery  atherosclerosis. Aortic Atherosclerosis (ICD10-I70.0). CT ABDOMEN AND PELVIS IMPRESSION 1. Extensive, large volume peritoneal/omental metastasis. This is presumably related to the primary colon mass. A synchronous metastatic ovarian carcinoma or primary peritoneal carcinoma could look similar. 2. New borderline abdominal and probable left external iliac adenopathy, suspicious for nodal metastasis. 3. Left nephrectomy. Multiple indeterminate right renal lesions which could represent complex cysts or 1 or more solid neoplasms. Of questionable clinical significance, given comorbidities. If further evaluation is desired, outpatient pre and post contrast abdominal MRI or renal protocol CT could be performed. 4. No bowel obstruction or other acute complication. Electronically Signed   By: Abigail Miyamoto M.D.   On: 05/28/2017 20:35   Ct Chest Wo Contrast  Result Date: 05/28/2017 CLINICAL DATA:  Colon cancer staging. Elevated creatinine. Malignant neoplasm of descending colon. EXAM: CT CHEST, ABDOMEN AND PELVIS WITHOUT CONTRAST TECHNIQUE: Multidetector CT imaging of the chest, abdomen and pelvis was performed following the standard protocol without IV contrast. COMPARISON:  12/14/2011 abdominopelvic CT. Chest radiograph 05/19/2005 reviewed. FINDINGS: CT CHEST FINDINGS Cardiovascular: Tortuous thoracic aorta. Aortic atherosclerosis. Normal heart size, without pericardial effusion. Multivessel coronary artery atherosclerosis. Mediastinum/Nodes: No mediastinal or definite hilar adenopathy, given limitations of unenhanced CT. Lungs/Pleura: No pleural fluid. Bilateral pulmonary nodules. Index right middle lobe nodule measures 2.0 cm on image 63/series 5. An index left upper lobe 2.0 cm nodule on image 52/series 5. Other smaller bilateral pulmonary nodules. Musculoskeletal: No acute osseous abnormality. CT ABDOMEN PELVIS FINDINGS Hepatobiliary: Irregular hepatic capsule, favored  to be related to the capsular metastasis. No  convincing evidence of intraparenchymal liver lesions. Normal gallbladder, without biliary ductal dilatation. Pancreas: Normal pancreas for age. Spleen: Normal in size, without focal abnormality. Adrenals/Urinary Tract: Normal right adrenal gland. Left adrenal thickening. Left nephrectomy. Multiple renal lesions. hyperattenuating lesions including the upper pole 2.4 cm in 42 HU on image 57/series 3, new. No hydronephrosis. Normal urinary bladder. Stomach/Bowel: Normal stomach, without wall thickening. Extensive colonic diverticulosis. Possible soft tissue fullness in the region of the splenic flexure, including on image 56/series 3. No obstruction. Normal small bowel caliber. Vascular/Lymphatic: Aortic and branch vessel atherosclerosis. Borderline enlarged left periaortic node of 10 mm on image 68/series 3, new. Probable left external iliac adenopathy at 1.5 cm on image 110 100/series 3. Reproductive: Uterus not well evaluated. Other: Small 2 moderate volume abdominopelvic fluid with extensive omental/ peritoneal metastasis. An index area of left abdominal omental nodularity measures on the order of 3.1 cm on image 74/series 3. A combination of right omental nodularity and fluid measures maximally 5.7 cm on image 81/series 3. Musculoskeletal: Bilateral hip osteoarthritis. IMPRESSION: CT CHEST IMPRESSION 1. Bilateral pulmonary nodules, consistent with metastatic disease. 2. No thoracic adenopathy. 3. Coronary artery atherosclerosis. Aortic Atherosclerosis (ICD10-I70.0). CT ABDOMEN AND PELVIS IMPRESSION 1. Extensive, large volume peritoneal/omental metastasis. This is presumably related to the primary colon mass. A synchronous metastatic ovarian carcinoma or primary peritoneal carcinoma could look similar. 2. New borderline abdominal and probable left external iliac adenopathy, suspicious for nodal metastasis. 3. Left nephrectomy. Multiple indeterminate right renal lesions which could represent complex cysts or 1 or  more solid neoplasms. Of questionable clinical significance, given comorbidities. If further evaluation is desired, outpatient pre and post contrast abdominal MRI or renal protocol CT could be performed. 4. No bowel obstruction or other acute complication. Electronically Signed   By: Abigail Miyamoto M.D.   On: 05/28/2017 20:35      Kalman Drape , [facility] 05/30/2017, 9:33 AM Pager: [phone] Consults: [phone] Mon-Fri 7:00 am-4:30 pm Sat-Sun 7:00 am-11:30 am

## 2017-05-31 ENCOUNTER — Ambulatory Visit: Payer: Self-pay | Admitting: Podiatry

## 2017-05-31 LAB — CBC
HEMATOCRIT: 30.5 % — AB (ref 36.0–46.0)
HEMOGLOBIN: 9.9 g/dL — AB (ref 12.0–15.0)
MCH: 27.2 pg (ref 26.0–34.0)
MCHC: 32.5 g/dL (ref 30.0–36.0)
MCV: 83.8 fL (ref 78.0–100.0)
Platelets: 235 10*3/uL (ref 150–400)
RBC: 3.64 MIL/uL — ABNORMAL LOW (ref 3.87–5.11)
RDW: 17.5 % — ABNORMAL HIGH (ref 11.5–15.5)
WBC: 7.3 10*3/uL (ref 4.0–10.5)

## 2017-05-31 LAB — BASIC METABOLIC PANEL
Anion gap: 8 (ref 5–15)
BUN: 18 mg/dL (ref 6–20)
CHLORIDE: 112 mmol/L — AB (ref 101–111)
CO2: 18 mmol/L — ABNORMAL LOW (ref 22–32)
CREATININE: 2.17 mg/dL — AB (ref 0.44–1.00)
Calcium: 9.4 mg/dL (ref 8.9–10.3)
GFR calc non Af Amer: 20 mL/min — ABNORMAL LOW (ref >60)
GFR, EST AFRICAN AMERICAN: 24 mL/min — AB (ref >60)
Glucose, Bld: 92 mg/dL (ref 65–99)
Potassium: 4 mmol/L (ref 3.5–5.1)
Sodium: 138 mmol/L (ref 135–145)

## 2017-05-31 NOTE — Progress Notes (Signed)
PT Cancellation Note  Patient Details Name: [name] MRN: [id number] DOB: [birth year]   Cancelled Treatment:    Reason Eval/Treat Not Completed: Patient not medically ready. Patient continues to have multiple DVTs and has not yet restarted anticoagulation nor has she had a filter placed. At this time, will continue to await clearance re: DVTs.   Thelma Comp 05/31/2017, 7:52 AM   Rolinda Roan, PT, DPT Acute Rehabilitation Services Pager: [phone]

## 2017-05-31 NOTE — Progress Notes (Signed)
3 Days Post-Op   Subjective/Chief Complaint: abdominal pain No complaints today    Objective: Vital signs in last 24 hours: Temp:  [98.5 F (36.9 C)-99.2 F (37.3 C)] 98.7 F (37.1 C) (10/22 0547) Pulse Rate:  [95-103] 96 (10/22 0547) Resp:  [18-20] 18 (10/22 0547) BP: (99-115)/(64-88) 99/64 (10/22 0547) SpO2:  [97 %-99 %] 97 % (10/22 0547) Last BM Date: 05/30/17  Intake/Output from previous day: 10/21 0701 - 10/22 0700 In: 360 [P.O.:360] Out: -  Intake/Output this shift: Total I/O In: 120 [P.O.:120] Out: -   GI: soft NT FULL   Lab Results:   Recent Labs  05/28/17 1220 05/31/17 0320  WBC 9.3 7.3  HGB 10.8* 9.9*  HCT 33.9* 30.5*  PLT 238 235   BMET  Recent Labs  05/31/17 0320  NA 138  K 4.0  CL 112*  CO2 18*  GLUCOSE 92  BUN 18  CREATININE 2.17*  CALCIUM 9.4   PT/INR No results for input(s): LABPROT, INR in the last 72 hours. ABG No results for input(s): PHART, HCO3 in the last 72 hours.  Invalid input(s): PCO2, PO2  Studies/Results: No results found.  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 05/31/2017

## 2017-05-31 NOTE — Anesthesia Postprocedure Evaluation (Signed)
Anesthesia Post Note  Patient: PATICIA MOSTER  Procedure(s) Performed: COLONOSCOPY WITH PROPOFOL (N/A ) ESOPHAGOGASTRODUODENOSCOPY (EGD) (N/A )     Patient location during evaluation: PACU Anesthesia Type: MAC Level of consciousness: awake and alert Pain management: pain level controlled Vital Signs Assessment: post-procedure vital signs reviewed and stable Respiratory status: spontaneous breathing, nonlabored ventilation, respiratory function stable and patient connected to nasal cannula oxygen Cardiovascular status: stable and blood pressure returned to baseline Postop Assessment: no apparent nausea or vomiting Anesthetic complications: no    Last Vitals:  Vitals:   05/30/17 2136 05/31/17 0547  BP: 105/67 99/64  Pulse: 97 96  Resp: 20 18  Temp:  37.1 C  SpO2: 97% 97%    Last Pain:  Vitals:   05/31/17 0547  TempSrc: Oral  PainSc:                  Raelan Burgoon

## 2017-05-31 NOTE — Progress Notes (Signed)
STROKE TEAM PROGRESS NOTE   SUBJECTIVE (INTERVAL HISTORY) No family is at bedside. Pt lying in bed, no acute distress and no acute event overnight. Seems in depressed mood but answer all questions appropriately and voiced no help needed at this time.  Biopsy results do pending.  OBJECTIVE CBC:   Recent Labs Lab 05/28/17 1220 05/31/17 0320  WBC 9.3 7.3  NEUTROABS 6.4  --   HGB 10.8* 9.9*  HCT 33.9* 30.5*  MCV 83.7 83.8  PLT 238 937    Basic Metabolic Panel:   Recent Labs Lab 05/26/17 0459 05/31/17 0320  NA 137 138  K 4.1 4.0  CL 114* 112*  CO2 16* 18*  GLUCOSE 100* 92  BUN 35* 18  CREATININE 2.67* 2.17*  CALCIUM 9.6 9.4    Lipid Panel:     Component Value Date/Time   CHOL 94 05/24/2017 2355   TRIG 89 05/24/2017 2355   HDL 28 (L) 05/24/2017 2355   CHOLHDL 3.4 05/24/2017 2355   VLDL 18 05/24/2017 2355   LDLCALC 48 05/24/2017 2355   HgbA1c:  Lab Results  Component Value Date   HGBA1C 6.5 (H) 05/24/2017   Urine Drug Screen: No results found for: LABOPIA, COCAINSCRNUR, LABBENZ, AMPHETMU, THCU, LABBARB  Alcohol Level No results found for: [place] I have personally reviewed the radiological images below and agree with the radiology interpretations.  Mr Brain Wo Contrast 05/24/2017 1. Small region of acute/early subacute infarction within the left posterior insula extending into left frontoparietal junction. 2. Additional scattered punctate foci of reduced diffusion are present in the bilateral convexities and cerebellar hemispheres. Multiple vascular territories suggests embolic phenomenon. 3. Late subacute to chronic infarct within left occipital lobe. 4. Moderate chronic microvascular ischemic changes and moderate parenchymal volume loss of the brain.   Mr Angiogram Head Wo Contrast 05/24/2017 1. Poor flow related signal within left MCA M2 inferior sylvian division likely representing thromboembolic occlusion. 2. Otherwise patent circle of Willis. No  aneurysm or high-grade stenosis.  Ct Head Code Stroke Wo Contrast` 05/24/2017 IMPRESSION:  1. No acute intracranial abnormality.  2. ASPECTS is 10/10  CT CHEST WO Contrast 05/28/2017 1. Bilateral pulmonary nodules, consistent with metastatic disease. 2. No thoracic adenopathy. 3. Coronary artery atherosclerosis. Aortic Atherosclerosis  CT ABDOMEN AND PELVIS WO Contrast 05/28/2017 1. Extensive, large volume peritoneal/omental metastasis.This is presumably related to the primary colon mass.  A synchronous metastatic ovarian carcinoma or primary peritoneal carcinoma could look similar. 2. New borderline abdominal and probable left external iliac adenopathy, suspicious for nodal metastasis. 3. Left nephrectomy. Multiple indeterminate right renal lesions which could represent complex cysts or 1 or more solid neoplasms. Of questionable clinical significance, given comorbidities. If further evaluation is desired, outpatient pre and post contrast abdominal MRI or renal protocol CT could be performed. 4. No bowel obstruction or other acute complication.  Transthoracic Echocardiogram  05/26/2017 Study Conclusions - Left ventricle: The cavity size was normal. Wall thickness was   normal. Systolic function was normal. The estimated ejection   fraction was in the range of 55% to 60%. Wall motion was normal;   there were no regional wall motion abnormalities. Doppler   parameters are consistent with abnormal left ventricular   relaxation (grade 1 diastolic dysfunction). - Aortic valve: There was trivial regurgitation. Impressions: - Normal LV systolic function; mild diastolic dysfunction;   sclerotic aortic valve with trace AI.  Bilateral Carotid Dopplers  05/25/2017 Summary: The vertebral arteries appear patent with antegrade flow. Findings consistent with 1-39  percent stenosis involving theright internal carotid artery and the left internal carotid artery.  TCD with bubbles   05/26/2017 HITS heard at rest and during Valsalva. Small PFO.  Bilateral Lower Extremity Venous Ultrasound 05/25/2017 Summary: - Findings consistent with acute deep vein thrombosis involving the right popliteal vein, right posterial tibial vein, and left posterial tibial vein. - As well as intramuscular thrombosis of the right gastrocnemiusveins.  Colon biopsy - pending   PHYSICAL EXAM  Temp:  [98.6 F (37 C)-99.2 F (37.3 C)] 98.9 F (37.2 C) (10/22 1306) Pulse Rate:  [92-98] 92 (10/22 1306) Resp:  [18-20] 20 (10/22 1306) BP: (99-111)/(62-83) 108/62 (10/22 1306) SpO2:  [97 %-99 %] 99 % (10/22 1306)   Vitals:   05/30/17 2136 05/31/17 0547 05/31/17 0910 05/31/17 1306  BP: 105/67 99/64 110/81 108/62  Pulse: 97 96 98 92  Resp: 20 18 20 20   Temp:  98.7 F (37.1 C) 98.7 F (37.1 C) 98.9 F (37.2 C)  TempSrc: Oral Oral Oral Oral  SpO2: 97% 97% 97% 99%  Weight:      Height:        General - Well nourished, well developedelderly lady, in no apparent distress.  Cardiovascular - Regular rate and rhythm.  Mental Status -  Level of arousal and orientation to time, place, and person were intact. Language including expression, naming, repetition, comprehension was intact.  Cranial Nerves II - XII - II - Visual field intact OU. III, IV, VI - Extraocular movements intact. V - Facial sensation intact bilaterally. VII - Facial movement intact bilaterally. VIII - Hearing & vestibular intact bilaterally. X - Palate elevates symmetrically. XI - Chin turning & shoulder shrug intact bilaterally. XII - Tongue protrusion intact.  Motor Strength - The patient's strength was normal in all extremities and pronator drift was absent.   Motor Tone - Muscle tone was assessed at the neck and appendages and was normal.  Reflexes - The patient's reflexes were 1+ in all extremities and she had no pathological reflexes.  Sensory - Light touch was assessed and was symmetrical.     Coordination - The patient had normal movements in the hands and feet with no ataxia or dysmetria.  Tremor was absent.  Gait and Station - deferred    ASSESSMENT/PLAN Shawna Schneider is a 82 y.o. female with history of hypertension, hyperlipidemia, diverticulosis, remote nephrectomy presenting with new onset dysarthria and expressive aphasia. She received tPA on Monday 10/15 at 1511.   Stroke:  Left insula infarct, bilateral ACA and cerebellar punctate infarcts, embolic pattern, most likely due to hypercoagulable state secondary to advanced malignancy   Resultant no significant deficit  CT head No acute abnormality  MRI head - Small region of acute/early subacute infarction within the left posterior insula extending into left frontoparietal junction, scattered punctate foci of reduced diffusion in the bilateral convexities and cerebellar hemispheres.  MRA head - Poor flow related signal within left MCA M2 inferior sylvian division likely representing thromboembolic occlusion  Carotid Doppler - no significant stenosis  2D Echo - EF 55-60%. No cardiac source of emboli identified.  TCD with bubbles - small PFO  (likely clinically insignificant)  LE venous Doppler- acute DVT bilaterally  LDL 48  HgbA1c 6.5%  SCDs for VTE prophylaxis Diet regular Room service appropriate? Yes; Fluid consistency: Thin  No antithrombotic prior to admission, now on No antithrombotic.  Patient currently refused anticoagulation, would like to make further decision tomorrow based on biopsy results.  Patient counseled to  be compliant with her antithrombotic medications  Ongoing aggressive stroke risk factor management  Therapy recommendations:  Pending  Disposition:  Pending  Advanced malignancy with widespread metastasis  GIB post tPA  CEA 39.2  Colonoscopy showed colon mass  Pan CT -wide spread metastasis  Colon mass biopsy pending  Ideally pt should be anticoagulated for  hypercoagulable state and for DVT and stroke prevention.  However, patient had GI bleeding after TPA, presumably high risk for continued bleeding with anticoagulation.  Patient would like to make decision after biopsy results.  Bilateral DVT  LE venous Doppler confirmed bilateral DVT  Consistent with hypercoagulable state secondary to advanced malignancy  Patient so far refused anti-correlation and would like to make further decision on Monday based on biopsy results  May consider IVC filter if not on anticoagulation.  Patient would like to make decision on Monday with biopsies without  Lower GI bleeding  s/p TPA  Hx of diveritculosis   No more bleeding since  Hgb 7.5->2 units pRBCs->10.3->10.8->9.9  CBC monitoring, stable  Hypertension  Stable  BP goal normotensive  Hyperlipidemia  Home meds: gemfibrozil  LDL 48, goal < 70  No statin at this time given low LDL and abdominal metastasis  Other Stroke Risk Factors  Advanced age  Obesity, Body mass index is 31.71 kg/m., recommend weight loss, diet and exercise as appropriate   Other Active Problems  S/p nephrectomy  CKD stage III - Cre 2.67->2.17   Hospital day # 7  [name], MD PhD Stroke Neurology 05/31/2017 5:52 PM   To contact Stroke Continuity provider, please refer to [email or web address] After hours, contact General Neurology

## 2017-05-31 NOTE — Progress Notes (Signed)
OT Cancellation Note  Patient Details Name: Shawna Schneider MRN: [id number] DOB: 17-Aug-1934   Cancelled Treatment:    Reason Eval/Treat Not Completed: Medical issues which prohibited therapy. Pt with multiple DVT and per MD note awaiting biopsy result prior to beginning anticoagulation or considering IVC placement. Will hold therapy at this time and check back as appropriate.   Norman Herrlich, MS OTR/L  Pager: [phone]   Norman Herrlich 05/31/2017, 7:52 AM

## 2017-06-01 DIAGNOSIS — Z8541 Personal history of malignant neoplasm of cervix uteri: Secondary | ICD-10-CM

## 2017-06-01 DIAGNOSIS — R188 Other ascites: Secondary | ICD-10-CM

## 2017-06-01 DIAGNOSIS — Z8543 Personal history of malignant neoplasm of ovary: Secondary | ICD-10-CM

## 2017-06-01 DIAGNOSIS — K639 Disease of intestine, unspecified: Secondary | ICD-10-CM

## 2017-06-01 DIAGNOSIS — R911 Solitary pulmonary nodule: Secondary | ICD-10-CM

## 2017-06-01 DIAGNOSIS — C801 Malignant (primary) neoplasm, unspecified: Secondary | ICD-10-CM

## 2017-06-01 DIAGNOSIS — I82441 Acute embolism and thrombosis of right tibial vein: Secondary | ICD-10-CM

## 2017-06-01 DIAGNOSIS — C786 Secondary malignant neoplasm of retroperitoneum and peritoneum: Secondary | ICD-10-CM

## 2017-06-01 DIAGNOSIS — N289 Disorder of kidney and ureter, unspecified: Secondary | ICD-10-CM

## 2017-06-01 DIAGNOSIS — I82442 Acute embolism and thrombosis of left tibial vein: Secondary | ICD-10-CM

## 2017-06-01 DIAGNOSIS — I639 Cerebral infarction, unspecified: Secondary | ICD-10-CM

## 2017-06-01 DIAGNOSIS — I82432 Acute embolism and thrombosis of left popliteal vein: Secondary | ICD-10-CM

## 2017-06-01 DIAGNOSIS — R599 Enlarged lymph nodes, unspecified: Secondary | ICD-10-CM

## 2017-06-01 NOTE — Consult Note (Signed)
Chief Complaint: Patient was seen in consultation today for omental mass biopsy Chief Complaint  Patient presents with  . Code Stroke   at the request of Dr Lavera Guise   Supervising Physician: Daryll Brod  Patient Status: [facility] - In-pt  History of Present Illness: Shawna Schneider is a 82 y.o. female   To ED 05/24/17 "not feeling well" Speech became abnormal--- CVA per imaging Developed GI bleed post tpa for stroke GI work up revealed colon mass Colon biopsy results showed adenoma but no malignancy Work up included imaging CT 10/19: IMPRESSION: CT CHEST IMPRESSION 1. Bilateral pulmonary nodules, consistent with metastatic disease. 2. No thoracic adenopathy. 3. Coronary artery atherosclerosis. Aortic Atherosclerosis (ICD10-I70.0).  CT ABDOMEN AND PELVIS IMPRESSION 1. Extensive, large volume peritoneal/omental metastasis. This is presumably related to the primary colon mass. A synchronous metastatic ovarian carcinoma or primary peritoneal carcinoma could look similar. 2. New borderline abdominal and probable left external iliac adenopathy, suspicious for nodal metastasis. 3. Left nephrectomy. Multiple indeterminate right renal lesions which could represent complex cysts or 1 or more solid neoplasms. Of questionable clinical significance, given comorbidities. If further evaluation is desired, outpatient pre and post contrast abdominal MRI or renal protocol CT could be performed. 4. No bowel obstruction or other acute complication  *PRELIMINARY RESULTS* Vascular Ultrasound Carotid Duplex (Doppler) : Bilateral: No significant (1-39%) ICA stenosis. Antegrade vertebral flow.  Lower ext venous duplex: DVT noted in the Right popliteal and posterior tibial veins and in the Left posterior tibial veins, as well as intramuscular thrombosis of the Right gastroc veins.  Request for omental mass biopsy post non malignant findings in colon mass biopsy Imaging reviewed and  approved per Dr Annamaria Boots   Past Medical History:  Diagnosis Date  . Hyperlipemia   . Solitary kidney     Past Surgical History:  Procedure Laterality Date  . COLONOSCOPY WITH PROPOFOL N/A 05/28/2017   Procedure: COLONOSCOPY WITH PROPOFOL;  Surgeon: Milus Banister, MD;  Location: [facility] ENDOSCOPY;  Service: Endoscopy;  Laterality: N/A;  . ESOPHAGOGASTRODUODENOSCOPY N/A 05/28/2017   Procedure: ESOPHAGOGASTRODUODENOSCOPY (EGD);  Surgeon: Milus Banister, MD;  Location: [facility] ENDOSCOPY;  Service: Endoscopy;  Laterality: N/A;  . KIDNEY SURGERY      Allergies: Penicillins  Medications: Prior to Admission medications   Medication Sig Start Date End Date Taking? Authorizing Provider  allopurinol (ZYLOPRIM) 100 MG tablet Take 100 mg by mouth daily. 02/25/17  Yes [provider]  diltiazem (CARTIA XT) 180 MG 24 hr capsule Take 180 mg by mouth daily.   Yes [provider]  levothyroxine (SYNTHROID, LEVOTHROID) 75 MCG tablet Take 75 mcg by mouth daily. 04/16/17  Yes [provider]  prednisoLONE acetate (PRED FORTE) 1 % ophthalmic suspension Place 1 drop into the left eye daily as needed.  03/19/17  Yes [provider]  rosuvastatin (CRESTOR) 20 MG tablet Take 5 mg by mouth daily.    Yes [provider]  valsartan-hydrochlorothiazide (DIOVAN-HCT) 320-12.5 MG tablet Take 1 tablet by mouth daily. 04/16/17  Yes [provider]     History reviewed. No pertinent family history.  Social History   Social History  . Marital status: Divorced    Spouse name: N/A  . Number of children: N/A  . Years of education: N/A   Social History Main Topics  . Smoking status: Never Smoker  . Smokeless tobacco: Never Used  . Alcohol use No  . Drug use: No  . Sexual activity: Not Asked   Other  Topics Concern  . None   Social History Narrative  . None    Review of Systems: A 12 point ROS discussed and pertinent positives are indicated in the HPI above.  All  other systems are negative.  Review of Systems  Constitutional: Positive for appetite change and fatigue. Negative for activity change and fever.  Respiratory: Negative for shortness of breath.   Gastrointestinal: Positive for abdominal pain. Negative for blood in stool.  Neurological: Positive for weakness.  Psychiatric/Behavioral: Negative for confusion and decreased concentration.    Vital Signs: BP 110/72 (BP Location: Left Arm)   Pulse 95   Temp 98.8 F (37.1 C) (Oral)   Resp 17   Ht 5\' 3"  (1.6 m)   Wt 179 lb (81.2 kg)   SpO2 100%   BMI 31.71 kg/m   Physical Exam  Constitutional: She is oriented to person, place, and time.  Cardiovascular: Normal rate, regular rhythm and normal heart sounds.   Pulmonary/Chest: Effort normal and breath sounds normal.  Abdominal: Soft. Bowel sounds are normal.  Musculoskeletal: Normal range of motion.  Neurological: She is alert and oriented to person, place, and time.  Skin: Skin is warm and dry.  Psychiatric: She has a normal mood and affect. Her behavior is normal. Judgment and thought content normal.  Nursing note and vitals reviewed.   Imaging: Ct Abdomen Pelvis Wo Contrast  Result Date: 05/28/2017 CLINICAL DATA:  Colon cancer staging. Elevated creatinine. Malignant neoplasm of descending colon. EXAM: CT CHEST, ABDOMEN AND PELVIS WITHOUT CONTRAST TECHNIQUE: Multidetector CT imaging of the chest, abdomen and pelvis was performed following the standard protocol without IV contrast. COMPARISON:  12/14/2011 abdominopelvic CT. Chest radiograph 05/19/2005 reviewed. FINDINGS: CT CHEST FINDINGS Cardiovascular: Tortuous thoracic aorta. Aortic atherosclerosis. Normal heart size, without pericardial effusion. Multivessel coronary artery atherosclerosis. Mediastinum/Nodes: No mediastinal or definite hilar adenopathy, given limitations of unenhanced CT. Lungs/Pleura: No pleural fluid. Bilateral pulmonary nodules. Index right middle lobe nodule  measures 2.0 cm on image 63/series 5. An index left upper lobe 2.0 cm nodule on image 52/series 5. Other smaller bilateral pulmonary nodules. Musculoskeletal: No acute osseous abnormality. CT ABDOMEN PELVIS FINDINGS Hepatobiliary: Irregular hepatic capsule, favored to be related to the capsular metastasis. No convincing evidence of intraparenchymal liver lesions. Normal gallbladder, without biliary ductal dilatation. Pancreas: Normal pancreas for age. Spleen: Normal in size, without focal abnormality. Adrenals/Urinary Tract: Normal right adrenal gland. Left adrenal thickening. Left nephrectomy. Multiple renal lesions. hyperattenuating lesions including the upper pole 2.4 cm in 42 HU on image 57/series 3, new. No hydronephrosis. Normal urinary bladder. Stomach/Bowel: Normal stomach, without wall thickening. Extensive colonic diverticulosis. Possible soft tissue fullness in the region of the splenic flexure, including on image 56/series 3. No obstruction. Normal small bowel caliber. Vascular/Lymphatic: Aortic and branch vessel atherosclerosis. Borderline enlarged left periaortic node of 10 mm on image 68/series 3, new. Probable left external iliac adenopathy at 1.5 cm on image 110 100/series 3. Reproductive: Uterus not well evaluated. Other: Small 2 moderate volume abdominopelvic fluid with extensive omental/ peritoneal metastasis. An index area of left abdominal omental nodularity measures on the order of 3.1 cm on image 74/series 3. A combination of right omental nodularity and fluid measures maximally 5.7 cm on image 81/series 3. Musculoskeletal: Bilateral hip osteoarthritis. IMPRESSION: CT CHEST IMPRESSION 1. Bilateral pulmonary nodules, consistent with metastatic disease. 2. No thoracic adenopathy. 3. Coronary artery atherosclerosis. Aortic Atherosclerosis (ICD10-I70.0). CT ABDOMEN AND PELVIS IMPRESSION 1. Extensive, large volume peritoneal/omental metastasis. This is presumably related to the primary  colon mass.  A synchronous metastatic ovarian carcinoma or primary peritoneal carcinoma could look similar. 2. New borderline abdominal and probable left external iliac adenopathy, suspicious for nodal metastasis. 3. Left nephrectomy. Multiple indeterminate right renal lesions which could represent complex cysts or 1 or more solid neoplasms. Of questionable clinical significance, given comorbidities. If further evaluation is desired, outpatient pre and post contrast abdominal MRI or renal protocol CT could be performed. 4. No bowel obstruction or other acute complication. Electronically Signed   By: Abigail Miyamoto M.D.   On: 05/28/2017 20:35   Ct Head Wo Contrast  Result Date: 05/25/2017 CLINICAL DATA:  Continued surveillance of stroke. Expressive aphasia. Post tPA. Patient developed lower GI bleed. EXAM: CT HEAD WITHOUT CONTRAST TECHNIQUE: Contiguous axial images were obtained from the base of the skull through the vertex without intravenous contrast. COMPARISON:  CT head 05/24/2017.  MR brain 05/24/2017. FINDINGS: Brain: Mild atrophy is redemonstrated. Early cytotoxic edema associated with the small acute infarcts in the LEFT cerebellum, now visible on CT. Areas of low level restricted diffusion in the insula, posterior frontal cortex, and subcortical white matter less well visualized. No hemorrhagic transformation. Stable atrophy with small vessel disease. Vascular: Calcification of the cavernous internal carotid arteries consistent with cerebrovascular atherosclerotic disease. No signs of intracranial large vessel occlusion. Skull: Normal. Negative for fracture or focal lesion. Sinuses/Orbits: No acute finding. Other: None. IMPRESSION: Developing cytotoxic edema associated with small acute infarcts LEFT cerebellum. Similar areas of cytotoxic edema in the LEFT cerebral hemisphere not well visualized. No areas of concern for post tPA hemorrhage. Electronically Signed   By: Staci Righter M.D.   On: 05/25/2017 15:12   Ct  Chest Wo Contrast  Result Date: 05/28/2017 CLINICAL DATA:  Colon cancer staging. Elevated creatinine. Malignant neoplasm of descending colon. EXAM: CT CHEST, ABDOMEN AND PELVIS WITHOUT CONTRAST TECHNIQUE: Multidetector CT imaging of the chest, abdomen and pelvis was performed following the standard protocol without IV contrast. COMPARISON:  12/14/2011 abdominopelvic CT. Chest radiograph 05/19/2005 reviewed. FINDINGS: CT CHEST FINDINGS Cardiovascular: Tortuous thoracic aorta. Aortic atherosclerosis. Normal heart size, without pericardial effusion. Multivessel coronary artery atherosclerosis. Mediastinum/Nodes: No mediastinal or definite hilar adenopathy, given limitations of unenhanced CT. Lungs/Pleura: No pleural fluid. Bilateral pulmonary nodules. Index right middle lobe nodule measures 2.0 cm on image 63/series 5. An index left upper lobe 2.0 cm nodule on image 52/series 5. Other smaller bilateral pulmonary nodules. Musculoskeletal: No acute osseous abnormality. CT ABDOMEN PELVIS FINDINGS Hepatobiliary: Irregular hepatic capsule, favored to be related to the capsular metastasis. No convincing evidence of intraparenchymal liver lesions. Normal gallbladder, without biliary ductal dilatation. Pancreas: Normal pancreas for age. Spleen: Normal in size, without focal abnormality. Adrenals/Urinary Tract: Normal right adrenal gland. Left adrenal thickening. Left nephrectomy. Multiple renal lesions. hyperattenuating lesions including the upper pole 2.4 cm in 42 HU on image 57/series 3, new. No hydronephrosis. Normal urinary bladder. Stomach/Bowel: Normal stomach, without wall thickening. Extensive colonic diverticulosis. Possible soft tissue fullness in the region of the splenic flexure, including on image 56/series 3. No obstruction. Normal small bowel caliber. Vascular/Lymphatic: Aortic and branch vessel atherosclerosis. Borderline enlarged left periaortic node of 10 mm on image 68/series 3, new. Probable left  external iliac adenopathy at 1.5 cm on image 110 100/series 3. Reproductive: Uterus not well evaluated. Other: Small 2 moderate volume abdominopelvic fluid with extensive omental/ peritoneal metastasis. An index area of left abdominal omental nodularity measures on the order of 3.1 cm on image 74/series 3. A combination of right omental nodularity and  fluid measures maximally 5.7 cm on image 81/series 3. Musculoskeletal: Bilateral hip osteoarthritis. IMPRESSION: CT CHEST IMPRESSION 1. Bilateral pulmonary nodules, consistent with metastatic disease. 2. No thoracic adenopathy. 3. Coronary artery atherosclerosis. Aortic Atherosclerosis (ICD10-I70.0). CT ABDOMEN AND PELVIS IMPRESSION 1. Extensive, large volume peritoneal/omental metastasis. This is presumably related to the primary colon mass. A synchronous metastatic ovarian carcinoma or primary peritoneal carcinoma could look similar. 2. New borderline abdominal and probable left external iliac adenopathy, suspicious for nodal metastasis. 3. Left nephrectomy. Multiple indeterminate right renal lesions which could represent complex cysts or 1 or more solid neoplasms. Of questionable clinical significance, given comorbidities. If further evaluation is desired, outpatient pre and post contrast abdominal MRI or renal protocol CT could be performed. 4. No bowel obstruction or other acute complication. Electronically Signed   By: Abigail Miyamoto M.D.   On: 05/28/2017 20:35   Mr Angiogram Head Wo Contrast  Result Date: 05/24/2017 CLINICAL DATA:  82 y/o  F; expressive aphasia. EXAM: MRI HEAD WITHOUT CONTRAST MRA HEAD WITHOUT CONTRAST TECHNIQUE: Multiplanar, multiecho pulse sequences of the brain and surrounding structures were obtained without intravenous contrast. Angiographic images of the head were obtained using MRA technique without contrast. COMPARISON:  05/24/2017 CT head. FINDINGS: MRI HEAD FINDINGS Brain: Small region of reduced diffusion involving the left  posterior insula and extending into the left frontoparietal junction cortex compatible with acute/early subacute infarction. Several additional punctate foci of reduced diffusion are present in the bilateral frontal and parietal lobes as well as bilateral cerebellar hemispheres. In the left posterior parietal lobe there is a small region of diffusion and T2 FLAIR hyperintensity without reduced diffusion on ADC probably representing a subacute infarction. There additional foci of T2 FLAIR hyperintense signal abnormality in subcortical periventricular white matter throughout the supratentorial brain that are nonspecific but likely represent moderate chronic microvascular ischemic changes. Moderate brain parenchymal volume loss. Vascular: As below. Skull and upper cervical spine: Normal marrow signal. Sinuses/Orbits: Negative. Other: None. MRA HEAD FINDINGS Anterior circulation: Right cavernous ICA laterally directed 2 mm sacculation. No additional finding for occlusion, aneurysm, or significant stenosis in bilateral ICA, bilateral ACA, and right MCA. Patent left M1 and proximal M2 branches. Poor flow related signal within left M2 inferior division (series 4, image 105 and series 406, image 5). Posterior circulation: No large vessel occlusion, aneurysm, or significant stenosis is identified. Anatomic variation: Patent anterior communicating artery. Bilateral fetal PCA. IMPRESSION: MRI head: 1. Small region of acute/early subacute infarction within the left posterior insula extending into left frontoparietal junction. 2. Additional scattered punctate foci of reduced diffusion are present in the bilateral convexities and cerebellar hemispheres. Multiple vascular territories suggests embolic phenomenon. 3. Late subacute to chronic infarct within left occipital lobe. 4. Moderate chronic microvascular ischemic changes and moderate parenchymal volume loss of the brain. MRA head: 1. Poor flow related signal within left MCA M2  inferior sylvian division likely representing thromboembolic occlusion. 2. Otherwise patent circle of Willis. No aneurysm or high-grade stenosis. These results will be called to the ordering clinician or representative by the Radiologist Assistant, and communication documented in the PACS or zVision Dashboard. Electronically Signed   By: Kristine Garbe M.D.   On: 05/24/2017 15:28   Mr Brain Wo Contrast  Result Date: 05/24/2017 CLINICAL DATA:  82 y/o  F; expressive aphasia. EXAM: MRI HEAD WITHOUT CONTRAST MRA HEAD WITHOUT CONTRAST TECHNIQUE: Multiplanar, multiecho pulse sequences of the brain and surrounding structures were obtained without intravenous contrast. Angiographic images of the head were obtained using  MRA technique without contrast. COMPARISON:  05/24/2017 CT head. FINDINGS: MRI HEAD FINDINGS Brain: Small region of reduced diffusion involving the left posterior insula and extending into the left frontoparietal junction cortex compatible with acute/early subacute infarction. Several additional punctate foci of reduced diffusion are present in the bilateral frontal and parietal lobes as well as bilateral cerebellar hemispheres. In the left posterior parietal lobe there is a small region of diffusion and T2 FLAIR hyperintensity without reduced diffusion on ADC probably representing a subacute infarction. There additional foci of T2 FLAIR hyperintense signal abnormality in subcortical periventricular white matter throughout the supratentorial brain that are nonspecific but likely represent moderate chronic microvascular ischemic changes. Moderate brain parenchymal volume loss. Vascular: As below. Skull and upper cervical spine: Normal marrow signal. Sinuses/Orbits: Negative. Other: None. MRA HEAD FINDINGS Anterior circulation: Right cavernous ICA laterally directed 2 mm sacculation. No additional finding for occlusion, aneurysm, or significant stenosis in bilateral ICA, bilateral ACA, and right  MCA. Patent left M1 and proximal M2 branches. Poor flow related signal within left M2 inferior division (series 4, image 105 and series 406, image 5). Posterior circulation: No large vessel occlusion, aneurysm, or significant stenosis is identified. Anatomic variation: Patent anterior communicating artery. Bilateral fetal PCA. IMPRESSION: MRI head: 1. Small region of acute/early subacute infarction within the left posterior insula extending into left frontoparietal junction. 2. Additional scattered punctate foci of reduced diffusion are present in the bilateral convexities and cerebellar hemispheres. Multiple vascular territories suggests embolic phenomenon. 3. Late subacute to chronic infarct within left occipital lobe. 4. Moderate chronic microvascular ischemic changes and moderate parenchymal volume loss of the brain. MRA head: 1. Poor flow related signal within left MCA M2 inferior sylvian division likely representing thromboembolic occlusion. 2. Otherwise patent circle of Willis. No aneurysm or high-grade stenosis. These results will be called to the ordering clinician or representative by the Radiologist Assistant, and communication documented in the PACS or zVision Dashboard. Electronically Signed   By: Kristine Garbe M.D.   On: 05/24/2017 15:28   Ct Head Code Stroke Wo Contrast`  Result Date: 05/24/2017 CLINICAL DATA:  Code stroke.  Aphasia and confusion. EXAM: CT HEAD WITHOUT CONTRAST TECHNIQUE: Contiguous axial images were obtained from the base of the skull through the vertex without intravenous contrast. COMPARISON:  None. FINDINGS: Brain: Moderate diffuse white matter changes are present. No acute or focal cortical abnormality is present. The basal ganglia are intact. The insular ribbon is normal bilaterally. The brainstem and cerebellum are normal. Vascular: Atherosclerotic calcifications are present within the cavernous internal carotid artery is bilaterally. There is no hyperdense  vessel. Skull: The calvarium is intact. No focal lytic or blastic lesions are present. Sinuses/Orbits: The paranasal sinuses and mastoid air cells are clear. Bilateral lens replacements are present. The globes and orbits are within normal limits bilaterally. ASPECTS [facility] Stroke Program Early CT Score) - Ganglionic level infarction (caudate, lentiform nuclei, internal capsule, insula, M1-M3 cortex): 7/7 - Supraganglionic infarction (M4-M6 cortex): 3/3 Total score (0-10 with 10 being normal): 10/10 IMPRESSION: 1. No acute intracranial abnormality. 2. ASPECTS is 10/10 These results were called by telephone at the time of interpretation on 05/24/2017 at 1:53 pm to Dr. Rory Percy, who verbally acknowledged these results. Electronically Signed   By: San Morelle M.D.   On: 05/24/2017 13:53    Labs:  CBC:  Recent Labs  05/25/17 1032 05/26/17 0459 05/28/17 1220 05/31/17 0320  WBC 9.5 9.2 9.3 7.3  HGB 10.3* 10.0* 10.8* 9.9*  HCT 31.8* 31.1* 33.9*  30.5*  PLT 212 218 238 235    COAGS:  Recent Labs  05/24/17 1320  INR 1.11  APTT 33    BMP:  Recent Labs  05/24/17 1320 05/24/17 1329 05/26/17 0459 05/31/17 0320  NA 138 140 137 138  K 3.8 3.9 4.1 4.0  CL 112* 114* 114* 112*  CO2 16*  --  16* 18*  GLUCOSE 90 85 100* 92  BUN 37* 35* 35* 18  CALCIUM 10.5*  --  9.6 9.4  CREATININE 3.22* 3.20* 2.67* 2.17*  GFRNONAA 13*  --  16* 20*  GFRAA 15*  --  18* 24*    LIVER FUNCTION TESTS:  Recent Labs  05/24/17 1320  BILITOT 0.4  AST 20  ALT 13*  ALKPHOS 105  PROT 7.0  ALBUMIN 3.4*    TUMOR MARKERS: No results for input(s): AFPTM, CEA, CA199, CHROMGRNA in the last 8760 hours.  Assessment and Plan:  New CVA New RLE DVT GI bleed after tpa for CVA GI work up shows colon mass: non malignant bx Now scheduled for omental mass biopsy Risks and benefits discussed with the patient including, but not limited to bleeding, infection, damage to adjacent structures or low yield  requiring additional tests. All of the patient's questions were answered, patient is agreeable to proceed. Consent signed and in chart.  Thank you for this interesting consult.  I greatly enjoyed meeting Shawna Schneider and look forward to participating in their care.  A copy of this report was sent to the requesting provider on this date.  Electronically Signed: Lavonia Drafts, PA-C 06/01/2017, 11:59 AM   I spent a total of 40 Minutes    in face to face in clinical consultation, greater than 50% of which was counseling/coordinating care for omental mass bx

## 2017-06-01 NOTE — Progress Notes (Signed)
STROKE TEAM PROGRESS NOTE   SUBJECTIVE (INTERVAL HISTORY) No family is at bedside. Pt lying in bed, no acute distress and no acute event overnight. Colon biopsy results showed adenoma but no malignancy. Discussed with Dr. Learta Codding and he recommend further biopsy from abdomen, consider ascites. Surgery already put in order for CT guided omental biopsy. Pt initially refused when interventional radiology PA talked with her, however, I discussed with her about the biopsy result and I was able to persuade her to proceed.  Will try to get consent.   OBJECTIVE CBC:   Recent Labs Lab 05/28/17 1220 05/31/17 0320  WBC 9.3 7.3  NEUTROABS 6.4  --   HGB 10.8* 9.9*  HCT 33.9* 30.5*  MCV 83.7 83.8  PLT 238 678    Basic Metabolic Panel:   Recent Labs Lab 05/26/17 0459 05/31/17 0320  NA 137 138  K 4.1 4.0  CL 114* 112*  CO2 16* 18*  GLUCOSE 100* 92  BUN 35* 18  CREATININE 2.67* 2.17*  CALCIUM 9.6 9.4    Lipid Panel:     Component Value Date/Time   CHOL 94 05/24/2017 2355   TRIG 89 05/24/2017 2355   HDL 28 (L) 05/24/2017 2355   CHOLHDL 3.4 05/24/2017 2355   VLDL 18 05/24/2017 2355   LDLCALC 48 05/24/2017 2355   HgbA1c:  Lab Results  Component Value Date   HGBA1C 6.5 (H) 05/24/2017   Urine Drug Screen: No results found for: LABOPIA, COCAINSCRNUR, LABBENZ, AMPHETMU, THCU, LABBARB  Alcohol Level No results found for: [place] I have personally reviewed the radiological images below and agree with the radiology interpretations.  Mr Brain Wo Contrast 05/24/2017 1. Small region of acute/early subacute infarction within the left posterior insula extending into left frontoparietal junction. 2. Additional scattered punctate foci of reduced diffusion are present in the bilateral convexities and cerebellar hemispheres. Multiple vascular territories suggests embolic phenomenon. 3. Late subacute to chronic infarct within left occipital lobe. 4. Moderate chronic microvascular  ischemic changes and moderate parenchymal volume loss of the brain.   Mr Angiogram Head Wo Contrast 05/24/2017 1. Poor flow related signal within left MCA M2 inferior sylvian division likely representing thromboembolic occlusion. 2. Otherwise patent circle of Willis. No aneurysm or high-grade stenosis.  Ct Head Code Stroke Wo Contrast` 05/24/2017 IMPRESSION:  1. No acute intracranial abnormality.  2. ASPECTS is 10/10  CT CHEST WO Contrast 05/28/2017 1. Bilateral pulmonary nodules, consistent with metastatic disease. 2. No thoracic adenopathy. 3. Coronary artery atherosclerosis. Aortic Atherosclerosis  CT ABDOMEN AND PELVIS WO Contrast 05/28/2017 1. Extensive, large volume peritoneal/omental metastasis.This is presumably related to the primary colon mass.  A synchronous metastatic ovarian carcinoma or primary peritoneal carcinoma could look similar. 2. New borderline abdominal and probable left external iliac adenopathy, suspicious for nodal metastasis. 3. Left nephrectomy. Multiple indeterminate right renal lesions which could represent complex cysts or 1 or more solid neoplasms. Of questionable clinical significance, given comorbidities. If further evaluation is desired, outpatient pre and post contrast abdominal MRI or renal protocol CT could be performed. 4. No bowel obstruction or other acute complication.  Transthoracic Echocardiogram  05/26/2017 Study Conclusions - Left ventricle: The cavity size was normal. Wall thickness was   normal. Systolic function was normal. The estimated ejection   fraction was in the range of 55% to 60%. Wall motion was normal;   there were no regional wall motion abnormalities. Doppler   parameters are consistent with abnormal left ventricular   relaxation (grade 1 diastolic  dysfunction). - Aortic valve: There was trivial regurgitation. Impressions: - Normal LV systolic function; mild diastolic dysfunction;   sclerotic aortic valve with trace  AI.  Bilateral Carotid Dopplers  05/25/2017 Summary: The vertebral arteries appear patent with antegrade flow. Findings consistent with 1-39 percent stenosis involving theright internal carotid artery and the left internal carotid artery.  TCD with bubbles  05/26/2017 HITS heard at rest and during Valsalva. Small PFO.  Bilateral Lower Extremity Venous Ultrasound 05/25/2017 Summary: - Findings consistent with acute deep vein thrombosis involving the right popliteal vein, right posterial tibial vein, and left posterial tibial vein. - As well as intramuscular thrombosis of the right gastrocnemiusveins.  Colon biopsy  - SUPERFICIAL FRAGMENTS OF TUBULOVILLOUS ADENOMA. - NO HIGH GRADE DYSPLASIA OR MALIGNANCY.   PHYSICAL EXAM  Temp:  [98.3 F (36.8 C)-99.2 F (37.3 C)] 98.8 F (37.1 C) (10/23 1018) Pulse Rate:  [73-96] 95 (10/23 1018) Resp:  [17-20] 17 (10/23 1018) BP: (102-113)/(62-87) 110/72 (10/23 1018) SpO2:  [95 %-100 %] 100 % (10/23 1018)   Vitals:   05/31/17 2052 06/01/17 0130 06/01/17 0544 06/01/17 1018  BP: 111/64 102/66 105/62 110/72  Pulse: 96 73 92 95  Resp: 18 18 18 17   Temp: 98.9 F (37.2 C) 99.2 F (37.3 C) 98.3 F (36.8 C) 98.8 F (37.1 C)  TempSrc: Oral Oral Oral Oral  SpO2: 97% 95% 97% 100%  Weight:      Height:        General - Well nourished, well developedelderly lady, in no apparent distress.  Cardiovascular - Regular rate and rhythm.  Mental Status -  Level of arousal and orientation to time, place, and person were intact. Language including expression, naming, repetition, comprehension was intact.  Cranial Nerves II - XII - II - Visual field intact OU. III, IV, VI - Extraocular movements intact. V - Facial sensation intact bilaterally. VII - Facial movement intact bilaterally. VIII - Hearing & vestibular intact bilaterally. X - Palate elevates symmetrically. XI - Chin turning & shoulder shrug intact bilaterally. XII - Tongue  protrusion intact.  Motor Strength - The patient's strength was normal in all extremities and pronator drift was absent.   Motor Tone - Muscle tone was assessed at the neck and appendages and was normal.  Reflexes - The patient's reflexes were 1+ in all extremities and she had no pathological reflexes.  Sensory - Light touch was assessed and was symmetrical.    Coordination - The patient had normal movements in the hands and feet with no ataxia or dysmetria.  Tremor was absent.  Gait and Station - deferred    ASSESSMENT/PLAN Ms. TILLEY FAETH is a 82 y.o. female with history of hypertension, hyperlipidemia, diverticulosis, remote nephrectomy presenting with new onset dysarthria and expressive aphasia. She received tPA on Monday 10/15 at 1511.   Stroke:  Left insula infarct, bilateral ACA and cerebellar punctate infarcts, embolic pattern, most likely due to hypercoagulable state secondary to advanced malignancy   Resultant no significant deficit  CT head No acute abnormality  MRI head - Small region of acute/early subacute infarction within the left posterior insula extending into left frontoparietal junction, scattered punctate foci of reduced diffusion in the bilateral convexities and cerebellar hemispheres.  MRA head - Poor flow related signal within left MCA M2 inferior sylvian division likely representing thromboembolic occlusion  Carotid Doppler - no significant stenosis  2D Echo - EF 55-60%. No cardiac source of emboli identified.  TCD with bubbles - small PFO  (likely  clinically insignificant)  LE venous Doppler- acute DVT bilaterally  LDL 48  HgbA1c 6.5%  SCDs for VTE prophylaxis Diet NPO time specified  No antithrombotic prior to admission, now on No antithrombotic.  Patient currently refused anticoagulation, would like to make further decision according to biopsy results.  Patient counseled to be compliant with her antithrombotic medications  Ongoing  aggressive stroke risk factor management  Therapy recommendations:  Pending  Disposition:  Pending  Advanced malignancy with widespread metastasis  GIB post tPA  CEA 39.2  Colonoscopy showed colon mass  Pan CT - wide spread metastasis  Colon mass biopsy adenoma and no maligancy  CT guided omental biopsy pending  Ideally pt should be anticoagulated for hypercoagulable state and for DVT and stroke prevention.  However, patient had GI bleeding after TPA, GI concerning high risk for continued bleeding with anticoagulation.  Patient would like to make decision after biopsy results.  Bilateral DVT  LE venous Doppler confirmed bilateral DVT  Consistent with hypercoagulable state secondary to advanced malignancy  Patient so far refused anti-coagulation and would like to make further decision based on biopsy results  May consider IVC filter if not on anticoagulation.  Patient would like to make decision based on further biopsy  Lower GI bleeding  s/p TPA  Hx of diveritculosis   No more bleeding since  Hgb 7.5->2 units pRBCs->10.3->10.8->9.9  CBC monitoring, stable  Hypertension  Stable  BP goal normotensive  Hyperlipidemia  Home meds: gemfibrozil  LDL 48, goal < 70  No statin at this time given low LDL and abdominal metastasis  Other Stroke Risk Factors  Advanced age  Obesity, Body mass index is 31.71 kg/m., recommend weight loss, diet and exercise as appropriate   Other Active Problems  S/p nephrectomy  CKD stage III - Cre 2.67->2.17   Hospital day # 8  I had long discussion with pt at bedside, updated pt about colon biopsy but encourage further biopsy with CT guided needle placement. She initially hesitant but later agreeable to proceed. I also called Dr. Learta Codding and discussed with him over the phone. Agree with further tissue biopsy.    [name], MD PhD Stroke Neurology 06/01/2017 11:30 AM   To contact Stroke Continuity provider, please  refer to [email or web address] After hours, contact General Neurology

## 2017-06-01 NOTE — Progress Notes (Signed)
PT Cancellation Note  Patient Details Name: Shawna Schneider MRN: [id number] DOB: 07-21-1935   Cancelled Treatment:    Reason Eval/Treat Not Completed: Patient not medically ready  Reason Eval/Treat Not Completed Medical issues which prohibited therapy (Pt with multiple DVTs and currently awaiting biopsy results to begin anticoagulation. Will hold therapy till MD clearance to mobility. Will check back as appropriate. Thank you.)      Duncan Dull 06/01/2017, 1:25 PM Alben Deeds, PT DPT  Board Certified Neurologic Specialist [phone]

## 2017-06-01 NOTE — Progress Notes (Signed)
4 Days Post-Op   Subjective/Chief Complaint: No new complaints  Path shows tubulovillous adenoma  Carcinomatosis on CT    Objective: Vital signs in last 24 hours: Temp:  [98.3 F (36.8 C)-99.2 F (37.3 C)] 98.8 F (37.1 C) (10/23 1352) Pulse Rate:  [73-96] 95 (10/23 1352) Resp:  [15-20] 15 (10/23 1352) BP: (102-121)/(62-87) 121/77 (10/23 1352) SpO2:  [95 %-100 %] 99 % (10/23 1352) Last BM Date: 05/30/17  Intake/Output from previous day: 10/22 0701 - 10/23 0700 In: 240 [P.O.:240] Out: -  Intake/Output this shift: No intake/output data recorded.  GI: soft, non-tender; bowel sounds normal; no masses,  no organomegaly  Lab Results:   Recent Labs  05/31/17 0320  WBC 7.3  HGB 9.9*  HCT 30.5*  PLT 235   BMET  Recent Labs  05/31/17 0320  NA 138  K 4.0  CL 112*  CO2 18*  GLUCOSE 92  BUN 18  CREATININE 2.17*  CALCIUM 9.4   PT/INR No results for input(s): LABPROT, INR in the last 72 hours. ABG No results for input(s): PHART, HCO3 in the last 72 hours.  Invalid input(s): PCO2, PO2  Studies/Results: No results found.  Anti-infectives: Anti-infectives    None      Assessment/Plan: CARCINOMATOSIS  Needs BX  Needs oncology consult  Not operative candidate at this point  Can go home and follow up as outpatient  CT bx scheduled    LOS: 8 days    Lacinda Curvin A. 06/01/2017

## 2017-06-01 NOTE — Care Management Note (Signed)
Case Management Note  Patient Details  Name: MELL MELLOTT MRN: [id number] Date of Birth: 06/06/1935  Subjective/Objective:                    Action/Plan: Awaiting PT eval once anticoagulation decided. Pt for CT biopsy today. CM following for d/c needs, physician orders.   Expected Discharge Date:                  Expected Discharge Plan:  Home/Self Care  In-House Referral:     Discharge planning Services     Post Acute Care Choice:    Choice offered to:     DME Arranged:    DME Agency:     HH Arranged:    HH Agency:     Status of Service:  In process, will continue to follow  If discussed at Long Length of Stay Meetings, dates discussed:    Additional Comments:  Pollie Friar, RN 06/01/2017, 1:03 PM

## 2017-06-01 NOTE — Progress Notes (Signed)
Patient ID: Shawna Schneider, female   DOB: [birth year], 82 y.o.   MRN: [id number]  Request received for omental mass biopsy Imaging reviewed and procedure approved with Dr Annamaria Boots  Discussed with pt this procedure and benefits and risks. She would like to HOLD off on this biopsy. She is awaiting results of colon mass bx performed 05/28/17. She also has questions as to plan for treatment if any and necessity of new biopsy.  Call into [facility] RN aware

## 2017-06-01 NOTE — Consult Note (Signed)
New Hematology/Oncology Consult   Referral PF:[id number] Erlinda Hong      Reason for Referral: Metastatic carcinoma   HPI: Shawna Schneider reports feeling well until she developed the acute onset of expressive aphasia on 05/24/2017. She presented to the emergency room and an MRI of the brain showed scattered left MCA and cerebellar strokes. She was given TPA and admitted. She developed rectal bleeding after the TPA and the hemoglobin dropped to 7.5. She was transfused packed red blood cells. She reports no further rectal bleeding. The neurologic symptoms resolved. Dr. Ardis Hughs was consulted and she was taken to an upper endoscopy and colonoscopy 05/28/2017. A large partially obstructing mass was noted in the descending colon. The mass did not appear typical of a colonic adenocarcinoma. The distal edge of the mass was tattooed. The colonoscope could not advance past the mass. The pathology revealed superficial fragments of a tubulovillous adenoma. No high-grade dysplasia or malignancy.  An echocardiogram 05/26/2017 revealed a small PFO, felt to be not clinically significant. Lower extremity Dopplers 05/25/2017 confirmed an acute left posterior tibial DVT and acute right posterior tibial and popliteal DVTs.  CTs of the chest, abdomen, and pelvis on 05/28/2017 revealed bilateral lung nodules, extensive peritoneal/omental metastases, abdominal and left external iliac adenopathy, indeterminate right renal lesions. Soft tissue fullness in the region of the splenic flexure. No bowel obstruction.  She is scheduled for a CT-guided biopsy of an omental mass tomorrow.     Past Medical History:  Diagnosis Date  . Hyperlipemia   . Solitary kidney   : . Papillary renal cell carcinoma, Fuhrman grade 3                                                               2006                                                                                                                                                                                  .     .     . Cervical cancer at age 30   Past Surgical History:  Procedure Laterality Date  . COLONOSCOPY WITH PROPOFOL N/A 05/28/2017   Procedure: COLONOSCOPY WITH PROPOFOL;  Surgeon: Milus Banister, MD;  Location: [facility] ENDOSCOPY;  Service: Endoscopy;  Laterality: N/A;  . ESOPHAGOGASTRODUODENOSCOPY N/A 05/28/2017   Procedure: ESOPHAGOGASTRODUODENOSCOPY (EGD);  Surgeon: Milus Banister, MD;  Location[facility] ENDOSCOPY;  Service: Endoscopy;  Laterality: N/A;  . Left nephrectomy     : .  Hysterectomy at age 3    Current Facility-Administered Medications:  .   stroke: mapping our early stages of recovery book, , Does not apply, Once, Estill Bakes, Stopped at 05/24/17 1604 .  acetaminophen (TYLENOL) tablet 650 mg, 650 mg, Oral, Q4H PRN, 650 mg at 05/25/17 1611 **OR** acetaminophen (TYLENOL) solution 650 mg, 650 mg, Per Tube, Q4H PRN **OR** acetaminophen (TYLENOL) suppository 650 mg, 650 mg, Rectal, Q4H PRN, Marliss Coots, PA-C .  aspirin EC tablet 81 mg, 81 mg, Oral, Daily, Garvin Fila, MD, 81 mg at 06/01/17 0944 .  [DISCONTINUED] latanoprost (XALATAN) 0.005 % ophthalmic solution 1 drop, 1 drop, Left Eye, Daily **AND** latanoprost (XALATAN) 0.005 % ophthalmic solution 1 drop, 1 drop, Both Eyes, QHS, Rice, Resa Miner, MD, 1 drop at 05/30/17 2113 .  senna-docusate (Senokot-S) tablet 1 tablet, 1 tablet, Oral, QHS PRN, Marliss Coots, PA-C:  .  stroke: mapping our early stages of recovery book   Does not apply Once  . aspirin EC  81 mg Oral Daily  . latanoprost  1 drop Both Eyes QHS  :  Allergies  Allergen Reactions  . Penicillins   :  FH: No family history of cancer   SOCIAL HISTORY: She lives alone in [place]. 2 sons live in town. She previously worked as a Teacher, early years/pre. She quit smoking cigarettes 20 years ago. No alcohol use. No transfusion history prior to this hospital admission   Review of Systems:  Positives include: Loose bowel movements,  abdominal swelling, abdominal tenderness, left foot pain prior to hospital admission  A complete ROS was otherwise negative.   Physical Exam:  Blood pressure 121/77, pulse 95, temperature 98.8 F (37.1 C), temperature source Oral, resp. rate 15, height 5\' 3"  (1.6 m), weight 179 lb (81.2 kg), SpO2 99 %.  HEENT: Oral cavity without visible mass, neck without mass Lungs: Clear bilaterally Cardiac: Regular rate and rhythm Abdomen: Mildly distended, no palpable mass, no hepatosplenomegaly, nontender  Vascular: No leg edema Lymph nodes: No cervical, supraclavicular, axillary, or inguinal nodes Neurologic: Alert and oriented, the speech is fluent, the motor exam appears grossly intact in the upper and lower extremities Skin: No rash  LABS:   Recent Labs  05/31/17 0320  WBC 7.3  HGB 9.9*  HCT 30.5*  PLT 235     Recent Labs  05/31/17 0320  NA 138  K 4.0  CL 112*  CO2 18*  GLUCOSE 92  BUN 18  CREATININE 2.17*  CALCIUM 9.4      RADIOLOGY:  Ct Abdomen Pelvis Wo Contrast  Result Date: 05/28/2017 CLINICAL DATA:  Colon cancer staging. Elevated creatinine. Malignant neoplasm of descending colon. EXAM: CT CHEST, ABDOMEN AND PELVIS WITHOUT CONTRAST TECHNIQUE: Multidetector CT imaging of the chest, abdomen and pelvis was performed following the standard protocol without IV contrast. COMPARISON:  12/14/2011 abdominopelvic CT. Chest radiograph 05/19/2005 reviewed. FINDINGS: CT CHEST FINDINGS Cardiovascular: Tortuous thoracic aorta. Aortic atherosclerosis. Normal heart size, without pericardial effusion. Multivessel coronary artery atherosclerosis. Mediastinum/Nodes: No mediastinal or definite hilar adenopathy, given limitations of unenhanced CT. Lungs/Pleura: No pleural fluid. Bilateral pulmonary nodules. Index right middle lobe nodule measures 2.0 cm on image 63/series 5. An index left upper lobe 2.0 cm nodule on image 52/series 5. Other smaller bilateral pulmonary nodules.  Musculoskeletal: No acute osseous abnormality. CT ABDOMEN PELVIS FINDINGS Hepatobiliary: Irregular hepatic capsule, favored to be related to the capsular metastasis. No convincing evidence of intraparenchymal liver lesions. Normal gallbladder, without biliary ductal dilatation. Pancreas:  Normal pancreas for age. Spleen: Normal in size, without focal abnormality. Adrenals/Urinary Tract: Normal right adrenal gland. Left adrenal thickening. Left nephrectomy. Multiple renal lesions. hyperattenuating lesions including the upper pole 2.4 cm in 42 HU on image 57/series 3, new. No hydronephrosis. Normal urinary bladder. Stomach/Bowel: Normal stomach, without wall thickening. Extensive colonic diverticulosis. Possible soft tissue fullness in the region of the splenic flexure, including on image 56/series 3. No obstruction. Normal small bowel caliber. Vascular/Lymphatic: Aortic and branch vessel atherosclerosis. Borderline enlarged left periaortic node of 10 mm on image 68/series 3, new. Probable left external iliac adenopathy at 1.5 cm on image 110 100/series 3. Reproductive: Uterus not well evaluated. Other: Small 2 moderate volume abdominopelvic fluid with extensive omental/ peritoneal metastasis. An index area of left abdominal omental nodularity measures on the order of 3.1 cm on image 74/series 3. A combination of right omental nodularity and fluid measures maximally 5.7 cm on image 81/series 3. Musculoskeletal: Bilateral hip osteoarthritis. IMPRESSION: CT CHEST IMPRESSION 1. Bilateral pulmonary nodules, consistent with metastatic disease. 2. No thoracic adenopathy. 3. Coronary artery atherosclerosis. Aortic Atherosclerosis (ICD10-I70.0). CT ABDOMEN AND PELVIS IMPRESSION 1. Extensive, large volume peritoneal/omental metastasis. This is presumably related to the primary colon mass. A synchronous metastatic ovarian carcinoma or primary peritoneal carcinoma could look similar. 2. New borderline abdominal and probable left  external iliac adenopathy, suspicious for nodal metastasis. 3. Left nephrectomy. Multiple indeterminate right renal lesions which could represent complex cysts or 1 or more solid neoplasms. Of questionable clinical significance, given comorbidities. If further evaluation is desired, outpatient pre and post contrast abdominal MRI or renal protocol CT could be performed. 4. No bowel obstruction or other acute complication. Electronically Signed   By: Abigail Miyamoto M.D.   On: 05/28/2017 20:35   Ct Head Wo Contrast  Result Date: 05/25/2017 CLINICAL DATA:  Continued surveillance of stroke. Expressive aphasia. Post tPA. Patient developed lower GI bleed. EXAM: CT HEAD WITHOUT CONTRAST TECHNIQUE: Contiguous axial images were obtained from the base of the skull through the vertex without intravenous contrast. COMPARISON:  CT head 05/24/2017.  MR brain 05/24/2017. FINDINGS: Brain: Mild atrophy is redemonstrated. Early cytotoxic edema associated with the small acute infarcts in the LEFT cerebellum, now visible on CT. Areas of low level restricted diffusion in the insula, posterior frontal cortex, and subcortical white matter less well visualized. No hemorrhagic transformation. Stable atrophy with small vessel disease. Vascular: Calcification of the cavernous internal carotid arteries consistent with cerebrovascular atherosclerotic disease. No signs of intracranial large vessel occlusion. Skull: Normal. Negative for fracture or focal lesion. Sinuses/Orbits: No acute finding. Other: None. IMPRESSION: Developing cytotoxic edema associated with small acute infarcts LEFT cerebellum. Similar areas of cytotoxic edema in the LEFT cerebral hemisphere not well visualized. No areas of concern for post tPA hemorrhage. Electronically Signed   By: Staci Righter M.D.   On: 05/25/2017 15:12   Ct Chest Wo Contrast  Result Date: 05/28/2017 CLINICAL DATA:  Colon cancer staging. Elevated creatinine. Malignant neoplasm of descending  colon. EXAM: CT CHEST, ABDOMEN AND PELVIS WITHOUT CONTRAST TECHNIQUE: Multidetector CT imaging of the chest, abdomen and pelvis was performed following the standard protocol without IV contrast. COMPARISON:  12/14/2011 abdominopelvic CT. Chest radiograph 05/19/2005 reviewed. FINDINGS: CT CHEST FINDINGS Cardiovascular: Tortuous thoracic aorta. Aortic atherosclerosis. Normal heart size, without pericardial effusion. Multivessel coronary artery atherosclerosis. Mediastinum/Nodes: No mediastinal or definite hilar adenopathy, given limitations of unenhanced CT. Lungs/Pleura: No pleural fluid. Bilateral pulmonary nodules. Index right middle lobe nodule measures 2.0 cm on image 63/series  5. An index left upper lobe 2.0 cm nodule on image 52/series 5. Other smaller bilateral pulmonary nodules. Musculoskeletal: No acute osseous abnormality. CT ABDOMEN PELVIS FINDINGS Hepatobiliary: Irregular hepatic capsule, favored to be related to the capsular metastasis. No convincing evidence of intraparenchymal liver lesions. Normal gallbladder, without biliary ductal dilatation. Pancreas: Normal pancreas for age. Spleen: Normal in size, without focal abnormality. Adrenals/Urinary Tract: Normal right adrenal gland. Left adrenal thickening. Left nephrectomy. Multiple renal lesions. hyperattenuating lesions including the upper pole 2.4 cm in 42 HU on image 57/series 3, new. No hydronephrosis. Normal urinary bladder. Stomach/Bowel: Normal stomach, without wall thickening. Extensive colonic diverticulosis. Possible soft tissue fullness in the region of the splenic flexure, including on image 56/series 3. No obstruction. Normal small bowel caliber. Vascular/Lymphatic: Aortic and branch vessel atherosclerosis. Borderline enlarged left periaortic node of 10 mm on image 68/series 3, new. Probable left external iliac adenopathy at 1.5 cm on image 110 100/series 3. Reproductive: Uterus not well evaluated. Other: Small 2 moderate volume  abdominopelvic fluid with extensive omental/ peritoneal metastasis. An index area of left abdominal omental nodularity measures on the order of 3.1 cm on image 74/series 3. A combination of right omental nodularity and fluid measures maximally 5.7 cm on image 81/series 3. Musculoskeletal: Bilateral hip osteoarthritis. IMPRESSION: CT CHEST IMPRESSION 1. Bilateral pulmonary nodules, consistent with metastatic disease. 2. No thoracic adenopathy. 3. Coronary artery atherosclerosis. Aortic Atherosclerosis (ICD10-I70.0). CT ABDOMEN AND PELVIS IMPRESSION 1. Extensive, large volume peritoneal/omental metastasis. This is presumably related to the primary colon mass. A synchronous metastatic ovarian carcinoma or primary peritoneal carcinoma could look similar. 2. New borderline abdominal and probable left external iliac adenopathy, suspicious for nodal metastasis. 3. Left nephrectomy. Multiple indeterminate right renal lesions which could represent complex cysts or 1 or more solid neoplasms. Of questionable clinical significance, given comorbidities. If further evaluation is desired, outpatient pre and post contrast abdominal MRI or renal protocol CT could be performed. 4. No bowel obstruction or other acute complication. Electronically Signed   By: Abigail Miyamoto M.D.   On: 05/28/2017 20:35   Mr Angiogram Head Wo Contrast  Result Date: 05/24/2017 CLINICAL DATA:  82 y/o  F; expressive aphasia. EXAM: MRI HEAD WITHOUT CONTRAST MRA HEAD WITHOUT CONTRAST TECHNIQUE: Multiplanar, multiecho pulse sequences of the brain and surrounding structures were obtained without intravenous contrast. Angiographic images of the head were obtained using MRA technique without contrast. COMPARISON:  05/24/2017 CT head. FINDINGS: MRI HEAD FINDINGS Brain: Small region of reduced diffusion involving the left posterior insula and extending into the left frontoparietal junction cortex compatible with acute/early subacute infarction. Several  additional punctate foci of reduced diffusion are present in the bilateral frontal and parietal lobes as well as bilateral cerebellar hemispheres. In the left posterior parietal lobe there is a small region of diffusion and T2 FLAIR hyperintensity without reduced diffusion on ADC probably representing a subacute infarction. There additional foci of T2 FLAIR hyperintense signal abnormality in subcortical periventricular white matter throughout the supratentorial brain that are nonspecific but likely represent moderate chronic microvascular ischemic changes. Moderate brain parenchymal volume loss. Vascular: As below. Skull and upper cervical spine: Normal marrow signal. Sinuses/Orbits: Negative. Other: None. MRA HEAD FINDINGS Anterior circulation: Right cavernous ICA laterally directed 2 mm sacculation. No additional finding for occlusion, aneurysm, or significant stenosis in bilateral ICA, bilateral ACA, and right MCA. Patent left M1 and proximal M2 branches. Poor flow related signal within left M2 inferior division (series 4, image 105 and series 406, image 5). Posterior circulation:  No large vessel occlusion, aneurysm, or significant stenosis is identified. Anatomic variation: Patent anterior communicating artery. Bilateral fetal PCA. IMPRESSION: MRI head: 1. Small region of acute/early subacute infarction within the left posterior insula extending into left frontoparietal junction. 2. Additional scattered punctate foci of reduced diffusion are present in the bilateral convexities and cerebellar hemispheres. Multiple vascular territories suggests embolic phenomenon. 3. Late subacute to chronic infarct within left occipital lobe. 4. Moderate chronic microvascular ischemic changes and moderate parenchymal volume loss of the brain. MRA head: 1. Poor flow related signal within left MCA M2 inferior sylvian division likely representing thromboembolic occlusion. 2. Otherwise patent circle of Willis. No aneurysm or  high-grade stenosis. These results will be called to the ordering clinician or representative by the Radiologist Assistant, and communication documented in the PACS or zVision Dashboard. Electronically Signed   By: Kristine Garbe M.D.   On: 05/24/2017 15:28   Mr Brain Wo Contrast  Result Date: 05/24/2017 CLINICAL DATA:  82 y/o  F; expressive aphasia. EXAM: MRI HEAD WITHOUT CONTRAST MRA HEAD WITHOUT CONTRAST TECHNIQUE: Multiplanar, multiecho pulse sequences of the brain and surrounding structures were obtained without intravenous contrast. Angiographic images of the head were obtained using MRA technique without contrast. COMPARISON:  05/24/2017 CT head. FINDINGS: MRI HEAD FINDINGS Brain: Small region of reduced diffusion involving the left posterior insula and extending into the left frontoparietal junction cortex compatible with acute/early subacute infarction. Several additional punctate foci of reduced diffusion are present in the bilateral frontal and parietal lobes as well as bilateral cerebellar hemispheres. In the left posterior parietal lobe there is a small region of diffusion and T2 FLAIR hyperintensity without reduced diffusion on ADC probably representing a subacute infarction. There additional foci of T2 FLAIR hyperintense signal abnormality in subcortical periventricular white matter throughout the supratentorial brain that are nonspecific but likely represent moderate chronic microvascular ischemic changes. Moderate brain parenchymal volume loss. Vascular: As below. Skull and upper cervical spine: Normal marrow signal. Sinuses/Orbits: Negative. Other: None. MRA HEAD FINDINGS Anterior circulation: Right cavernous ICA laterally directed 2 mm sacculation. No additional finding for occlusion, aneurysm, or significant stenosis in bilateral ICA, bilateral ACA, and right MCA. Patent left M1 and proximal M2 branches. Poor flow related signal within left M2 inferior division (series 4, image 105  and series 406, image 5). Posterior circulation: No large vessel occlusion, aneurysm, or significant stenosis is identified. Anatomic variation: Patent anterior communicating artery. Bilateral fetal PCA. IMPRESSION: MRI head: 1. Small region of acute/early subacute infarction within the left posterior insula extending into left frontoparietal junction. 2. Additional scattered punctate foci of reduced diffusion are present in the bilateral convexities and cerebellar hemispheres. Multiple vascular territories suggests embolic phenomenon. 3. Late subacute to chronic infarct within left occipital lobe. 4. Moderate chronic microvascular ischemic changes and moderate parenchymal volume loss of the brain. MRA head: 1. Poor flow related signal within left MCA M2 inferior sylvian division likely representing thromboembolic occlusion. 2. Otherwise patent circle of Willis. No aneurysm or high-grade stenosis. These results will be called to the ordering clinician or representative by the Radiologist Assistant, and communication documented in the PACS or zVision Dashboard. Electronically Signed   By: Kristine Garbe M.D.   On: 05/24/2017 15:28   Ct Head Code Stroke Wo Contrast`  Result Date: 05/24/2017 CLINICAL DATA:  Code stroke.  Aphasia and confusion. EXAM: CT HEAD WITHOUT CONTRAST TECHNIQUE: Contiguous axial images were obtained from the base of the skull through the vertex without intravenous contrast. COMPARISON:  None. FINDINGS:  Brain: Moderate diffuse white matter changes are present. No acute or focal cortical abnormality is present. The basal ganglia are intact. The insular ribbon is normal bilaterally. The brainstem and cerebellum are normal. Vascular: Atherosclerotic calcifications are present within the cavernous internal carotid artery is bilaterally. There is no hyperdense vessel. Skull: The calvarium is intact. No focal lytic or blastic lesions are present. Sinuses/Orbits: The paranasal sinuses and  mastoid air cells are clear. Bilateral lens replacements are present. The globes and orbits are within normal limits bilaterally. ASPECTS [facility] Stroke Program Early CT Score) - Ganglionic level infarction (caudate, lentiform nuclei, internal capsule, insula, M1-M3 cortex): 7/7 - Supraganglionic infarction (M4-M6 cortex): 3/3 Total score (0-10 with 10 being normal): 10/10 IMPRESSION: 1. No acute intracranial abnormality. 2. ASPECTS is 10/10 These results were called by telephone at the time of interpretation on 05/24/2017 at 1:53 pm to Dr. Rory Percy, who verbally acknowledged these results. Electronically Signed   By: San Morelle M.D.   On: 05/24/2017 13:53    Assessment and Plan:   1. Probable metastatic carcinoma  Colonoscopy 05/27/2017-left colon mass, biopsy confirmed a tubulovillous adenoma  CTs chest, abdomen, and pelvis 05/28/2017-ascites, carcinomatosis, lung nodules, fullness at the splenic flexure  2. 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 GI bleeding after TPA 05/24/2017-resolved  4. Bilateral lower extremity DVTs confirmed on Doppler 05/25/2017  5. Small PFO  6.  Cervical cancer at age 16  7.  Papillary renal cell carcinoma 2006, status post left nephrectomy   Ms. Gutknecht is admitted with embolic CVAs. Stroke symptoms have resolved. She appears to have a widespread malignancy. She most likely has metastatic colon cancer despite the nondiagnostic biopsy. She is scheduled undergo a omental mass biopsy tomorrow. The differential diagnosis includes metastatic disease from another primary tumor site and less likely recurrent renal cell carcinoma or cervical cancer.  She experienced bleeding after receiving TPA. She most likely has a hypercoagulation syndrome secondary to the metastatic tumor. She is at high risk for pulmonary embolism and recurrent CVAs. The source of embolization may be the lower extremity DVTs with a PFO she could have  marantic endocarditis not seen on the transthoracic echocardiogram.  I discussed the probable diagnosis of metastatic carcinoma with Ms. Quentin Cornwall. She understands the difficult decision on anticoagulation therapy given the recent bleeding. We can consider placement of a retrievable Greenfield filter versus an attempt at anticoagulation therapy. She stated that she does not wish to receive Lovenox injections or Coumadin. I do not recommend a DOAC in this setting.    Recommendations: 1. Proceed with biopsy of an omental mass 2. Retrievable Greenfield filter versus a trial of anticoagulation therapy if gastroenterology feels anticoagulation is appropriate. 3. Consider TEE if marantic endocarditis remains a consideration and plan is to continue aggressive diagnostic/therapeutic interventions 4. Oncology will continue following her in the hospital and we will arrange for an outpatient appointment at the [facility].  I     Donneta Romberg, MD 06/01/2017, 2:27 PM

## 2017-06-01 NOTE — Progress Notes (Signed)
Patient advised that she did not want to be woken up to have her vital signs taken tonight.  RN educated patient on the importance of having vital signs taken.  Patient verbalized understanding however she still refusing to have vital signs taken.

## 2017-06-01 NOTE — Progress Notes (Signed)
OT Cancellation    06/01/17 0700  OT Visit Information  Last OT Received On 06/01/17  Reason Eval/Treat Not Completed Medical issues which prohibited therapy (Pt with multiple DVTs and currently awaiting biopsy results to begin anticoagulation. Will hold therapy till MD clearance to mobility. Will check back as appropriate. Thank you.)   [place], OTR/L Acute Rehab Pager: [phone] Office: [phone]

## 2017-06-02 ENCOUNTER — Inpatient Hospital Stay (HOSPITAL_COMMUNITY): Payer: BC Managed Care – PPO

## 2017-06-02 DIAGNOSIS — E785 Hyperlipidemia, unspecified: Secondary | ICD-10-CM

## 2017-06-02 DIAGNOSIS — I10 Essential (primary) hypertension: Secondary | ICD-10-CM

## 2017-06-02 DIAGNOSIS — C800 Disseminated malignant neoplasm, unspecified: Secondary | ICD-10-CM

## 2017-06-02 LAB — BASIC METABOLIC PANEL
ANION GAP: 9 (ref 5–15)
BUN: 14 mg/dL (ref 6–20)
CALCIUM: 9.8 mg/dL (ref 8.9–10.3)
CO2: 20 mmol/L — AB (ref 22–32)
Chloride: 111 mmol/L (ref 101–111)
Creatinine, Ser: 2.03 mg/dL — ABNORMAL HIGH (ref 0.44–1.00)
GFR, EST AFRICAN AMERICAN: 25 mL/min — AB (ref >60)
GFR, EST NON AFRICAN AMERICAN: 22 mL/min — AB (ref >60)
Glucose, Bld: 91 mg/dL (ref 65–99)
Potassium: 4 mmol/L (ref 3.5–5.1)
SODIUM: 140 mmol/L (ref 135–145)

## 2017-06-02 LAB — CBC
HEMATOCRIT: 30.8 % — AB (ref 36.0–46.0)
HEMOGLOBIN: 9.9 g/dL — AB (ref 12.0–15.0)
MCH: 27 pg (ref 26.0–34.0)
MCHC: 32.1 g/dL (ref 30.0–36.0)
MCV: 83.9 fL (ref 78.0–100.0)
Platelets: 272 10*3/uL (ref 150–400)
RBC: 3.67 MIL/uL — ABNORMAL LOW (ref 3.87–5.11)
RDW: 17.8 % — AB (ref 11.5–15.5)
WBC: 7.6 10*3/uL (ref 4.0–10.5)

## 2017-06-02 LAB — PROTIME-INR
INR: 1.07
PROTHROMBIN TIME: 13.9 s (ref 11.4–15.2)

## 2017-06-02 MED ORDER — LIDOCAINE HCL 1 % IJ SOLN
INTRAMUSCULAR | Status: AC
  Filled 2017-06-02: qty 20

## 2017-06-02 MED ORDER — APIXABAN 2.5 MG PO TABS: 2.5000 mg | ORAL_TABLET | Freq: Two times a day (BID) | ORAL | 2 refills | Status: DC

## 2017-06-02 MED ORDER — MIDAZOLAM HCL 2 MG/2ML IJ SOLN
INTRAMUSCULAR | Status: AC
  Filled 2017-06-02: qty 4

## 2017-06-02 MED ORDER — FENTANYL CITRATE (PF) 100 MCG/2ML IJ SOLN
INTRAMUSCULAR | Status: AC | PRN
  Administered 2017-06-02: 50 ug via INTRAVENOUS

## 2017-06-02 MED ORDER — FENTANYL CITRATE (PF) 100 MCG/2ML IJ SOLN
INTRAMUSCULAR | Status: AC
  Filled 2017-06-02: qty 4

## 2017-06-02 MED ORDER — MIDAZOLAM HCL 2 MG/2ML IJ SOLN
INTRAMUSCULAR | Status: AC | PRN
  Administered 2017-06-02: 1 mg via INTRAVENOUS

## 2017-06-02 MED ORDER — APIXABAN 2.5 MG PO TABS: 2.5000 mg | ORAL_TABLET | Freq: Two times a day (BID) | ORAL | Status: DC

## 2017-06-02 NOTE — Progress Notes (Signed)
[facility] Progress Note  5 Days Post-Op  Subjective: CC: wants to go home Patient denies abdominal pain, n/v. Having BMs. Tolerating diet. Patient states she really does not want any surgery. She would like to go home.  UOP good. VSS.   Objective: Vital signs in last 24 hours: Temp:  [98.6 F (37 C)-98.8 F (37.1 C)] 98.6 F (37 C) (10/24 0444) Pulse Rate:  [94-98] 97 (10/24 0900) Resp:  [15-22] 22 (10/24 0900) BP: (99-121)/(60-84) 103/70 (10/24 0900) SpO2:  [98 %-100 %] 99 % (10/24 0900) Last BM Date: 05/30/17  Intake/Output from previous day: No intake/output data recorded. Intake/Output this shift: No intake/output data recorded.  PE: Gen:  Alert, NAD, pleasant Card:  Regular rate and rhythm Pulm:  Normal effort, clear to auscultation bilaterally Abd: Soft, non-tender, non-distended, bowel sounds present, no HSM Skin: warm and dry, no rashes  Psych: A&Ox3   Lab Results:   Recent Labs  05/31/17 0320 06/02/17 0425  WBC 7.3 7.6  HGB 9.9* 9.9*  HCT 30.5* 30.8*  PLT 235 272   BMET  Recent Labs  05/31/17 0320 06/02/17 0425  NA 138 140  K 4.0 4.0  CL 112* 111  CO2 18* 20*  GLUCOSE 92 91  BUN 18 14  CREATININE 2.17* 2.03*  CALCIUM 9.4 9.8   PT/INR  Recent Labs  06/02/17 0425  LABPROT 13.9  INR 1.07   CMP     Component Value Date/Time   NA 140 06/02/2017 0425   K 4.0 06/02/2017 0425   CL 111 06/02/2017 0425   CO2 20 (L) 06/02/2017 0425   GLUCOSE 91 06/02/2017 0425   BUN 14 06/02/2017 0425   CREATININE 2.03 (H) 06/02/2017 0425   CALCIUM 9.8 06/02/2017 0425   PROT 7.0 05/24/2017 1320   ALBUMIN 3.4 (L) 05/24/2017 1320   AST 20 05/24/2017 1320   ALT 13 (L) 05/24/2017 1320   ALKPHOS 105 05/24/2017 1320   BILITOT 0.4 05/24/2017 1320   GFRNONAA 22 (L) 06/02/2017 0425   GFRAA 25 (L) 06/02/2017 0425    Anti-infectives: Anti-infectives    None       Assessment/Plan Recent CVA treated with tPA Hx of A. Fib Hx of BL LE  DVT HTN  L radical nephrectomy by Dr. Diona Fanti 2012   Lower GI bleed Large polyp in the descending colon, concerning for malignancy - Large partially obstructing polypoid mass in the descending colon, tattoo'd and biopsied; unable to traverse with a scope. EGD showed no etiology for her GI bleed. - CEA 39.2 - CT chest/abd/pelvis showed Extensive, large volume peritoneal/omental metastasis, + adendopathy  - Hgb 9.9, VSS - path shows tubulovillous adenoma - CT biopsy of omental mass 10/24, path pending - oncology following, appreciate input  DVT R popliteal, R posterior tibial, & L posterior tibial veins - chemical anticoagulation vs [place] filter    FEN: reg diet per GI VTE: SCDs ID: no current abx Follow up: TBD  Plan: No obstructive symptoms. No emergent surgery at this time. Patient with pulmonary metastasis and carcinomatosis, path shows tubulovillous adenoma. Oncology involved. Patient is not an operative candidate at this time, she may follow up as an outpatient. We will sign off, please call with any questions or concerns.   LOS: 9 days    Brigid Re , [facility] 06/02/2017, 9:07 AM Pager: [phone] Consults: [phone] Mon-Fri 7:00 am-4:30 pm Sat-Sun 7:00 am-11:30 am

## 2017-06-02 NOTE — Procedures (Signed)
Omental mass  S/p CT RT OMENTAL BX  No comp Stable Path pending Full report in pacs

## 2017-06-02 NOTE — Progress Notes (Signed)
Physical Therapy Evaluation Patient Details Name: Shawna Schneider MRN: [id number] DOB: Jun 10, 1935 Today's Date: 06/02/2017   History of Present Illness  Pt is an 82 y.o. female with h/o HTN, HLD, diverticulosis, remote nephrectomy, and bilateral DVTs. She presented to the ED on 05/24/17 with new onset dysarthria and expressive aphasia. She received tPA on 10/15 at 1511. Imaging revealing: Small region of acute/early subacute infarction within the left posterior insula extending into left frontoparietal junction, scattered punctate foci of reduced diffusion in the bilateral convexities and cerebellar hemispheres. Most likely due to hypercoagulable state secondary to advanced malignancy.   Clinical Impression  Patient presents with the above. Evaluated patient and noted findings below. Patient reports functioning at her baseline and does not demonstrate significant mobility deficits. No further acute services are needed at this time. PT will sign off.     Follow Up Recommendations No PT follow up    Equipment Recommendations  None recommended by PT    Recommendations for Other Services       Precautions / Restrictions Precautions Precautions: None Restrictions Weight Bearing Restrictions: No      Mobility  Bed Mobility               General bed mobility comments: Pt OOB in recliner upon arrival  Transfers Overall transfer level: Independent Equipment used: None Transfers: Sit to/from Stand Sit to Stand: Independent         General transfer comment: no difficulty or concern with moving sit <> stand  Ambulation/Gait Ambulation/Gait assistance: Independent Ambulation Distance (Feet): 25 Feet Assistive device: None Gait Pattern/deviations: Step-through pattern;WFL(Within Functional Limits) Gait velocity: steady Gait velocity interpretation: at or above normal speed for age/gender General Gait Details: pt does not demonstrate any significant gait deficits at this  time. She did not require any physical assist and pacing was steady. She reports ambulating at her baseline.  Stairs            Wheelchair Mobility    Modified Rankin (Stroke Patients Only) Modified Rankin (Stroke Patients Only) Pre-Morbid Rankin Score: No symptoms Modified Rankin: No symptoms     Balance Overall balance assessment: No apparent balance deficits (not formally assessed)                                           Pertinent Vitals/Pain Pain Assessment: No/denies pain    Home Living Family/patient expects to be discharged to:: Private residence Living Arrangements: Alone Available Help at Discharge: Family;Available PRN/intermittently Type of Home: House Home Access: Stairs to enter Entrance Stairs-Rails: Right Entrance Stairs-Number of Steps: 6 Home Layout: Two level;Laundry or work area in [place]: Grab bars - tub/shower;Walker - 4 wheels;Cane - single point;Shower seat - built in      Prior Function Level of Independence: Independent         Comments: drives, 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      Hand Dominance   Dominant Hand: Right    Extremity/Trunk Assessment   Upper Extremity Assessment Upper Extremity Assessment: Defer to OT evaluation    Lower Extremity Assessment Lower Extremity Assessment: Overall WFL for tasks assessed       Communication   Communication: No difficulties  Cognition Arousal/Alertness: Awake/alert Behavior During Therapy: WFL for tasks assessed/performed Overall Cognitive Status: Within Functional Limits for tasks assessed  General Comments      Exercises     Assessment/Plan    PT Assessment Patent does not need any further PT services  PT Problem List         PT Treatment Interventions      PT Goals (Current goals can be found in the Care Plan section)  Acute Rehab PT  Goals Patient Stated Goal: to go home PT Goal Formulation: With patient Time For Goal Achievement: 06/16/17 Potential to Achieve Goals: Good    Frequency     Barriers to discharge        Co-evaluation               AM-PAC PT "6 Clicks" Daily Activity  Outcome Measure Difficulty turning over in bed (including adjusting bedclothes, sheets and blankets)?: None Difficulty moving from lying on back to sitting on the side of the bed? : None Difficulty sitting down on and standing up from a chair with arms (e.g., wheelchair, bedside commode, etc,.)?: None Help needed moving to and from a bed to chair (including a wheelchair)?: None Help needed walking in hospital room?: None Help needed climbing 3-5 steps with a railing? : A Little 6 Click Score: 23    End of Session Equipment Utilized During Treatment: Gait belt Activity Tolerance: Patient tolerated treatment well Patient left: in chair;with call bell/phone within reach Nurse Communication: Mobility status PT Visit Diagnosis: Other symptoms and signs involving the nervous system (R29.898)    Time: 1425-1435 PT Time Calculation (min) (ACUTE ONLY): 10 min   Charges:   PT Evaluation $PT Eval Moderate Complexity: 1 Mod     PT G CodesSindy Guadeloupe, SPT [phone] office   Margarita Grizzle 06/02/2017, 2:49 PM

## 2017-06-02 NOTE — Care Management Note (Signed)
Case Management Note  Patient Details  Name: Shawna Schneider MRN: [id number] Date of Birth: 04-23-1935  Subjective/Objective:                    Action/Plan: Pt discharging home with self care. CM consulted for new Eliquis. CM provided her 30 day free card and $10 copay card.CM called patients son TJ and informed him of d/c and cards provided. CM inquired about patient having some observation at home and TJ states family will be check on patient and assisting her at home.  TJ to provide transport home.   Expected Discharge Date:  06/02/17               Expected Discharge Plan:  Home/Self Care  In-House Referral:     Discharge planning Services  CM Consult, Medication Assistance  Post Acute Care Choice:    Choice offered to:     DME Arranged:    DME Agency:     HH Arranged:    HH Agency:     Status of Service:  Completed, signed off  If discussed at [employer] of Stay Meetings, dates discussed:    Additional Comments:  Pollie Friar, RN 06/02/2017, 7:51 PM

## 2017-06-02 NOTE — Sedation Documentation (Signed)
Report called to 3W.

## 2017-06-02 NOTE — Discharge Summary (Addendum)
Stroke Discharge Summary  Patient ID: Shawna Schneider   MRN: [id number]      DOB: 02/19/1935  Date of Admission: 05/24/2017 Date of Discharge: 06/02/2017  Attending Physician:  [name], MD Consultant(s):   Treatment Team:  Ladell Pier, MD from Oncology and Dr. Brantley Stage General surgery and Dr. Annamaria Boots from IR Patient's PCP:  [name], MD  DISCHARGE DIAGNOSIS: acute CVA s/p tPA  Active Problems:   Carcinomatosis - primary source unclear   Gastrointestinal hemorrhage   Adenoma of descending colon ([place])   Acute deep vein thrombosis (DVT) of distal vein of both lower extremities ([facility])   HTN   HLD  Past Medical History:  Diagnosis Date  . Hyperlipemia   . Solitary kidney    Past Surgical History:  Procedure Laterality Date  . COLONOSCOPY WITH PROPOFOL N/A 05/28/2017   Procedure: COLONOSCOPY WITH PROPOFOL;  Surgeon: Milus Banister, MD;  Location[facility] ENDOSCOPY;  Service: Endoscopy;  Laterality: N/A;  . ESOPHAGOGASTRODUODENOSCOPY N/A 05/28/2017   Procedure: ESOPHAGOGASTRODUODENOSCOPY (EGD);  Surgeon: Milus Banister, MD;  Location: [facility] ENDOSCOPY;  Service: Endoscopy;  Laterality: N/A;  . KIDNEY SURGERY      Allergies as of 06/02/2017      Reactions   Penicillins       Medication List    TAKE these medications   allopurinol 100 MG tablet Commonly known as:  ZYLOPRIM Take 100 mg by mouth daily.   apixaban 2.5 MG Tabs tablet Commonly known as:  ELIQUIS Take 1 tablet (2.5 mg total) by mouth 2 (two) times daily.   CARTIA XT 180 MG 24 hr capsule Generic drug:  diltiazem Take 180 mg by mouth daily.   CRESTOR 20 MG tablet Generic drug:  rosuvastatin Take 5 mg by mouth daily.   levothyroxine 75 MCG tablet Commonly known as:  SYNTHROID, LEVOTHROID Take 75 mcg by mouth daily.   prednisoLONE acetate 1 % ophthalmic suspension Commonly known as:  PRED FORTE Place 1 drop into the left eye daily as needed.   valsartan-hydrochlorothiazide 320-12.5 MG  tablet Commonly known as:  DIOVAN-HCT Take 1 tablet by mouth daily.       LABORATORY STUDIES CBC    Component Value Date/Time   WBC 7.6 06/02/2017 0425   RBC 3.67 (L) 06/02/2017 0425   HGB 9.9 (L) 06/02/2017 0425   HCT 30.8 (L) 06/02/2017 0425   PLT 272 06/02/2017 0425   MCV 83.9 06/02/2017 0425   MCH 27.0 06/02/2017 0425   MCHC 32.1 06/02/2017 0425   RDW 17.8 (H) 06/02/2017 0425   LYMPHSABS 1.7 05/28/2017 1220   MONOABS 0.8 05/28/2017 1220   EOSABS 0.3 05/28/2017 1220   BASOSABS 0.0 05/28/2017 1220   CMP    Component Value Date/Time   NA 140 06/02/2017 0425   K 4.0 06/02/2017 0425   CL 111 06/02/2017 0425   CO2 20 (L) 06/02/2017 0425   GLUCOSE 91 06/02/2017 0425   BUN 14 06/02/2017 0425   CREATININE 2.03 (H) 06/02/2017 0425   CALCIUM 9.8 06/02/2017 0425   PROT 7.0 05/24/2017 1320   ALBUMIN 3.4 (L) 05/24/2017 1320   AST 20 05/24/2017 1320   ALT 13 (L) 05/24/2017 1320   ALKPHOS 105 05/24/2017 1320   BILITOT 0.4 05/24/2017 1320   GFRNONAA 22 (L) 06/02/2017 0425   GFRAA 25 (L) 06/02/2017 0425   COAGS Lab Results  Component Value Date   INR 1.07 06/02/2017   INR 1.11 05/24/2017   Lipid Panel  Component Value Date/Time   CHOL 94 05/24/2017 2355   TRIG 89 05/24/2017 2355   HDL 28 (L) 05/24/2017 2355   CHOLHDL 3.4 05/24/2017 2355   VLDL 18 05/24/2017 2355   LDLCALC 48 05/24/2017 2355   HgbA1C  Lab Results  Component Value Date   HGBA1C 6.5 (H) 05/24/2017   Urinalysis No results found for: COLORURINE, APPEARANCEUR, LABSPEC, PHURINE, GLUCOSEU, HGBUR, BILIRUBINUR, KETONESUR, PROTEINUR, UROBILINOGEN, NITRITE, LEUKOCYTESUR Urine Drug Screen No results found for: LABOPIA, COCAINSCRNUR, LABBENZ, AMPHETMU, THCU, LABBARB  Alcohol Level No results found for: [facility]   SIGNIFICANT DIAGNOSTIC STUDIES Mr Brain Wo Contrast 05/24/2017 1. Small region of acute/early subacute infarction within the left posterior insula extending into left frontoparietal junction. 2.  Additional scattered punctate foci of reduced diffusion are present in the bilateral convexities and cerebellar hemispheres. Multiple vascular territories suggests embolic phenomenon. 3. Late subacute to chronic infarct within left occipital lobe. 4. Moderate chronic microvascular ischemic changes and moderate parenchymal volume loss of the brain.   Mr Angiogram Head Wo Contrast 05/24/2017 1. Poor flow related signal within left MCA M2 inferior sylvian division likely representing thromboembolic occlusion. 2. Otherwise patent circle of Willis. No aneurysm or high-grade stenosis.  Ct Head Code Stroke Wo Contrast` 05/24/2017 IMPRESSION:  1. No acute intracranial abnormality.  2. ASPECTS is 10/10  CT CHEST WO Contrast 05/28/2017 1. Bilateral pulmonary nodules, consistent with metastatic disease. 2. No thoracic adenopathy. 3. Coronary artery atherosclerosis. Aortic Atherosclerosis  CT ABDOMEN AND PELVIS WO Contrast 05/28/2017 1. Extensive, large volume peritoneal/omental metastasis.This is presumably related to the primary colon mass.  A synchronous metastatic ovarian carcinoma or primary peritoneal carcinoma could look similar. 2. New borderline abdominal and probable left external iliac adenopathy, suspicious for nodal metastasis. 3. Left nephrectomy. Multiple indeterminate right renal lesions which could represent complex cysts or 1 or more solid neoplasms. Of questionable clinical significance, given comorbidities. If further evaluation is desired, outpatient pre and post contrast abdominal MRI or renal protocol CT could be performed. 4. No bowel obstruction or other acute complication.  Transthoracic Echocardiogram  05/26/2017 Study Conclusions - Left ventricle: The cavity size was normal. Wall thickness was normal. Systolic function was normal. The estimated ejection fraction was in the range of 55% to 60%. Wall motion was normal; there were no regional wall motion  abnormalities. Doppler parameters are consistent with abnormal left ventricular relaxation (grade 1 diastolic dysfunction). - Aortic valve: There was trivial regurgitation. Impressions: - Normal LV systolic function; mild diastolic dysfunction; sclerotic aortic valve with trace AI.  Bilateral Carotid Dopplers  05/25/2017 Summary: The vertebral arteries appear patent with antegrade flow. Findings consistent with 1-39 percent stenosis involving theright internal carotid artery and the left internal carotid artery.  TCD with bubbles  05/26/2017 HITS heard at rest and during Valsalva. Small PFO.  Bilateral Lower Extremity Venous Ultrasound 05/25/2017 Summary: - Findings consistent with acute deep vein thrombosis involving the right popliteal vein, right posterial tibial vein, and left posterial tibial vein. - As well as intramuscular thrombosis of the right gastrocnemiusveins.  Colon biopsy  - SUPERFICIAL FRAGMENTS OF TUBULOVILLOUS ADENOMA. - NO HIGH GRADE DYSPLASIA OR MALIGNANCY.    HISTORY OF PRESENT ILLNESS DILYNN MUNROE is an 82 y.o. female who is brought from her PCP office to the emergency room due to the fact that she "was not feeling good" per EMS/ per PCP. Upon arrival to the ED patient was not speaking very much however when she did speak she initially started with clear fluent speech  and then had made up words. Patient was able to follow commands but not do so with full effort. Patient CT was negative. Upon entering the room and second assessment was done, when asking patient to tell me her name and where she was she did not speak, when asked to move her extremities she would lift them however one hand was placed overhead the hand did not hit her head was diverted. When noxious stimuli was administered patient quickly was able to speak very clearly with no aphasia and no dysarthria. Initially she was not able to move her extremities however when noxious stimuli  was given she had 5 out of 5 strength throughout. At a later point, the family arrived and said that she was probably on some blood thinners for irregular heart rhythm. Currently not on any blood thinners. I could not find any records in the electronic medical record of her having a.fibrillation. Also c/o LE pain. 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  Due to initial exam being inconsistent-tPA was not given and MRI ordered.  MRI showed scattered left MCA and cerebellar strokes. tPA was given  Date last known well: Date: 05/24/2017 Time last known well: Time: 1100 hrs tPA Given: yes -stroke on MRI with aphasia NIHSS 2 (one for aphasia and one for dysarthria) Modified Rankin: Rankin Score=0   HOSPITAL COURSE Ms. VERLISA VARA is a 82 y.o. female with history of hypertension, hyperlipidemia, diverticulosis, remote nephrectomy presenting with new onset dysarthria and expressive aphasia. She received tPA on Monday 10/15 at 1511. Developed LGIB after tPA. GI and surgery involved and colonoscopy showed descending colon mass, biopsy showed adenoma. However, CT chest and abdomen and pelvis showed diffuse carcinomatosis. Found to have b/l DVT. CT guided omental biopsy done, result pending. Pt initially refused anticoagulation but later agree with eliquis but still refuse lovenox injection, coumadin or IVC filter. She will follow up with oncology and surgery as outpt. Will put on eliquis 2.5mg  bid due to age > 51 and Cre > 1.5. She will self monitor any bleeding at home while on anticoagulation.   Stroke:  Left insula infarct, bilateral ACA and cerebellar punctate infarcts, embolic pattern, most likely due to hypercoagulable state secondary to advanced malignancy   Resultant no significant deficit  CT head No acute abnormality  MRI head - Small region of acute/early subacute infarction within the left posterior insula extending into left frontoparietal junction, scattered punctate foci of reduced diffusion  in the bilateral convexities and cerebellar hemispheres.  MRA head - Poor flow related signal within left MCA M2 inferior sylvian division likely representing thromboembolic occlusion  Carotid Doppler - no significant stenosis  2D Echo - EF 55-60%. No cardiac source of emboli identified.  TCD with bubbles - small PFO  (likely clinically insignificant)  LE venous Doppler- acute DVT bilaterally  LDL 48  HgbA1c 6.5%  SCDs for VTE prophylaxis  Diet NPO time specified  No antithrombotic prior to admission, now on No antithrombotic. Patient currently refused lovenox, coumadin or IVC filter, but agree with eliquis. Will start eliquis 2.5mg  bid based on age and Cre. Will start tomorrow given she just had omental biopsy today.   Patient counseled to be compliant with her antithrombotic medications  Ongoing aggressive stroke risk factor management  Therapy recommendations:  none  Disposition:  home  Advanced malignancy with widespread metastasis  GIB post tPA  CEA 39.2  Colonoscopy showed colon mass  Pan CT - wide spread metastasis  Colon mass biopsy  adenoma and no maligancy  CT guided omental biopsy done result pending  Oncology Dr. Learta Codding on board, appreciate recs - will continue to follow up as outpt  General surgery signed off, appreciate recs - will continue to follow up as outpt  Patient currently refused lovenox, coumadin or IVC filter, but agree with eliquis. Will start eliquis 2.5mg  bid based on age and Cre. Will start tomorrow given she just had omental biopsy today.   Bilateral DVT  LE venous Doppler confirmed bilateral DVT  Consistent with hypercoagulable state secondary to advanced malignancy  Patient currently refused lovenox, coumadin or IVC filter, but agree with eliquis. Do not think she is able to tolerate higher dose eliquis for DVT treatment given age, Cre, recent CT procedure and recent GIB. Will start eliquis 2.5mg  bid. Will start tomorrow given  she just had omental biopsy today.   Lower GI bleeding  s/p TPA  Hx of diveritculosis   No more bleeding since  Hgb 7.5->2 units pRBCs->10.3->10.8->9.9->9.9  CBC monitoring, stable  Hypertension  Stable  BP goal normotensive  Hyperlipidemia  Home meds: gemfibrozil  LDL 48, goal < 70  No statin at this time given low LDL and abdominal metastasis  Other Stroke Risk Factors  Advanced age  Obesity, Body mass index is 31.71 kg/m., recommend weight loss, diet and exercise as appropriate   Other Active Problems  Renal cell carcinoma s/p nephrectomy  Hx of cervical cancer  CKD stage III - Cre 2.67->2.17->2.03  DISCHARGE EXAM Blood pressure 111/74, pulse (!) 105, temperature 98.6 F (37 C), temperature source Oral, resp. rate 20, height 5\' 3"  (1.6 m), weight 179 lb (81.2 kg), SpO2 99 %.  General - Well nourished, well developedelderly lady, in no apparent distress.  Cardiovascular - Regular rate and rhythm.  Mental Status -  Level of arousal and orientation to time, place, and person were intact. Language including expression, naming, repetition, comprehension was intact.  Cranial Nerves II - XII - II - Visual field intact OU. III, IV, VI - Extraocular movements intact. V - Facial sensation intact bilaterally. VII - Facial movement intact bilaterally. VIII - Hearing & vestibular intact bilaterally. X - Palate elevates symmetrically. XI - Chin turning & shoulder shrug intact bilaterally. XII - Tongue protrusion intact.  Motor Strength - The patient's strength was normal in all extremities and pronator drift was absent.   Motor Tone - Muscle tone was assessed at the neck and appendages and was normal.  Reflexes - The patient's reflexes were 1+ in all extremities and she had no pathological reflexes.  Sensory - Light touch was assessed and was symmetrical.    Coordination - The patient had normal movements in the hands and feet with no ataxia  or dysmetria.  Tremor was absent.  Gait and Station - deferred  Discharge Diet   Diet regular Room service appropriate? Yes; Fluid consistency: Thin liquids  DISCHARGE PLAN  Disposition:  Home today  Eliquis (apixaban) daily for secondary stroke prevention.  Ongoing risk factor control by Primary Care Physician at time of discharge  Follow-up [name], MD in 2 weeks.  Follow up with Dr. Lonn Georgia as outpt in oncology in 2 weeks  Follow up with Dr. Brantley Stage as outpt in surgery in 2 weeks  Follow-up with Dr. [name] [facility] in 6 weeks, office to schedule an appointment.  40 minutes were spent preparing discharge.  [name], MD PhD Stroke Neurology 06/02/2017 1:42 PM

## 2017-06-02 NOTE — Sedation Documentation (Signed)
Patient is resting comfortably. 

## 2017-06-02 NOTE — Discharge Instructions (Signed)
·   Take Eliquis 2.5mg  twice a daily for DVT treatment and stroke prevention. Please pick up from pharmacy and start taking on 06/03/17. First month medication free and then need co-pay starting from second month.   Stop taking aspirin.   Close observation, bleeding precautions. If there is any bleeding while taking eliquis, please stop and call your family doctor right away or come to ER for evaluation.   Ongoing risk factor control by Primary Care Physician at time of discharge  Follow-up [name], MD in 2 weeks.  Follow up with Dr. Lonn Georgia as outpt in oncology in 2 weeks. You can call the number given to you for appointment, or wait the office to call you back.   Follow up with Dr. Brantley Stage as outpt in surgery in 2 weeks or as needed.  Follow-up with Dr. [name] in 6 weeks, office to schedule an appointment.

## 2017-06-03 ENCOUNTER — Encounter: Payer: Self-pay | Admitting: Nurse Practitioner

## 2017-06-03 ENCOUNTER — Telehealth: Payer: Self-pay | Admitting: Nurse Practitioner

## 2017-06-03 NOTE — Telephone Encounter (Signed)
Appt has been scheduled for the pt to see Ned Card on 10/31 at 145pm. Letter mailed.

## 2017-06-04 ENCOUNTER — Telehealth: Payer: Self-pay | Admitting: Neurology

## 2017-06-04 NOTE — Telephone Encounter (Signed)
Called patient and discussed with her over the phone.  Delivered omental biopsy result to her.  And let her know that she had appointment with oncology on 06/09/17 in the afternoon.  She has not received the letter for the appointment but appreciate my calling and letting her know.  Otherwise, she has been doing well since discharge without complaints.  I encouraged her not missing the appointment with oncology.  She expressed understanding and appreciation.  [name], MD PhD Stroke Neurology 06/04/2017 5:05 PM

## 2017-06-09 ENCOUNTER — Ambulatory Visit (HOSPITAL_BASED_OUTPATIENT_CLINIC_OR_DEPARTMENT_OTHER): Payer: BC Managed Care – PPO | Admitting: Nurse Practitioner

## 2017-06-09 ENCOUNTER — Telehealth: Payer: Self-pay | Admitting: Oncology

## 2017-06-09 VITALS — BP 128/82 | HR 102 | Temp 99.0°F | Resp 18 | Ht 63.0 in | Wt 184.0 lb

## 2017-06-09 DIAGNOSIS — C801 Malignant (primary) neoplasm, unspecified: Secondary | ICD-10-CM

## 2017-06-09 DIAGNOSIS — K769 Liver disease, unspecified: Secondary | ICD-10-CM

## 2017-06-09 DIAGNOSIS — I82439 Acute embolism and thrombosis of unspecified popliteal vein: Secondary | ICD-10-CM

## 2017-06-09 DIAGNOSIS — C786 Secondary malignant neoplasm of retroperitoneum and peritoneum: Secondary | ICD-10-CM

## 2017-06-09 DIAGNOSIS — I639 Cerebral infarction, unspecified: Secondary | ICD-10-CM

## 2017-06-09 DIAGNOSIS — I82442 Acute embolism and thrombosis of left tibial vein: Secondary | ICD-10-CM

## 2017-06-09 DIAGNOSIS — R599 Enlarged lymph nodes, unspecified: Secondary | ICD-10-CM

## 2017-06-09 DIAGNOSIS — R911 Solitary pulmonary nodule: Secondary | ICD-10-CM

## 2017-06-09 DIAGNOSIS — I82441 Acute embolism and thrombosis of right tibial vein: Secondary | ICD-10-CM | POA: Diagnosis not present

## 2017-06-09 DIAGNOSIS — Z8541 Personal history of malignant neoplasm of cervix uteri: Secondary | ICD-10-CM

## 2017-06-09 DIAGNOSIS — Z8553 Personal history of malignant neoplasm of renal pelvis: Secondary | ICD-10-CM | POA: Diagnosis not present

## 2017-06-09 NOTE — Progress Notes (Addendum)
[place] OFFICE PROGRESS NOTE   Diagnosis:  Metastatic carcinoma  INTERVAL HISTORY:   Shawna Schneider returns for her first follow-up visit since hospital discharge. She presented with acute onset of expressive aphasia 05/24/2017. Brain MRI showed scattered left MCA and cerebellar strokes. She was given TPA and admitted. She developed rectal bleeding after the TPA. She required red cell transfusion support. The neurologic symptoms resolved. She was taken to an upper endoscopy and colonoscopy 05/28/2017. A large partially obstructing mass was noted in the descending colon. The mass did not appear typical of a colonic adenocarcinoma. The colonoscope could not be advanced past the mass. Pathology revealed superficial fragments of a tubulovillous adenoma. There was no high-grade dysplasia or malignancy.  An echocardiogram 05/26/2017 revealed a small PFO, felt to be not clinically significant. Lower extremity Dopplers 05/25/2017 confirmed an acute left posterior tibial DVT and acute right posterior tibial and popliteal DVTs.  CT scans of the chest/abdomen/pelvis on 05/28/2017 showed bilateral lung nodules, extensive peritoneal/omental metastases, abdominal and left external iliac adenopathy, indeterminate right renal lesions. There were soft tissue fullness in the region of the splenic flexure. There is no bowel obstruction.  She underwent CT-guided biopsy of an omental mass 06/02/2017. Pathology showed mucinous adenocarcinoma.  She overall is feeling well. The neurologic symptoms have completely resolved. She has had no further bleeding. She is on Eliquis. She denies abdominal pain. Bowels are moving. No nausea or vomiting.   Objective:  Vital signs in last 24 hours:  Blood pressure 128/82, pulse (!) 102, temperature 99 F (37.2 C), temperature source Oral, resp. rate 18, height 5\' 3"  (1.6 m), weight 184 lb (83.5 kg), SpO2 99 %.    HEENT: no thrush or ulcers. Lymphatics: no  palpable cervical or supraclavicular lymph nodes. Resp: lungs clear bilaterally. Cardio: regular rate and rhythm. GI: abdomen soft and nontender. No hepatomegaly. No mass. No apparent ascites. Vascular: no leg edema. Neuro: alert and oriented. Speech fluent.    Lab Results:  Lab Results  Component Value Date   WBC 7.6 06/02/2017   HGB 9.9 (L) 06/02/2017   HCT 30.8 (L) 06/02/2017   MCV 83.9 06/02/2017   PLT 272 06/02/2017   NEUTROABS 6.4 05/28/2017    Imaging:  No results found.  Medications: I have reviewed the patient's current medications.  Assessment/Plan: 1. Metastatic adenocarcinoma  Colonoscopy 05/27/2017-left colon mass, biopsy confirmed a tubulovillous adenoma  CTs chest, abdomen, and pelvis 05/28/2017-ascites, carcinomatosis, lung nodules, fullness at the splenic flexure  Biopsy of omental mass 06/02/2017--mucinous adenocarcinoma  2. 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 GI bleeding after TPA 05/24/2017-resolved  4. 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 Small PFO  6.  Cervical cancer at age 82  7.  Papillary renal cell carcinoma 2006, status post left nephrectomy   Disposition: Shawna Schneider has been diagnosed with metastatic adenocarcinoma of unknown primary. A GI primary is suspected. Dr. Benay Spice reviewed the diagnosis, prognosis and treatment options with Shawna Schneider at today's visit. She and her son understand that no therapy will be curative.  Dr. Benay Spice recommends treatment on the FOLFOX regimen. We reviewed potential toxicities associated with chemotherapy including bone marrow toxicity, nausea, hair loss, allergic reaction. We discussed potential toxicities associated with 5-fluorouracil including mouth sores, diarrhea, hand-foot syndrome, skin hyperpigmentation, conjunctivitis. We discussed the cold sensitivity, peripheral neuropathy, acute laryngopharyngeal  dysesthesia associated with oxaliplatin. We also discussed the possibility of more rare occurrences such as diplopia, ataxia, incontinence. She  understands a Port-A-Cath is required for this regimen.  She is undecided regarding proceeding with treatment. She would like to come back for a follow-up visit in one week for additional discussion. In the meantime we will contact pathology and request immunostains as well as Foundation 1 testing. Her case will be presented at the upcoming GI tumor conference.  She will return for a follow-up visit on 06/16/2017. She will contact the office in the interim with any problems.  Patient seen with Dr. Benay Spice. CT image was reviewed on the computer with Shawna Schneider and her son. 45 minutes were spent face-to-face at today's visit with the majority of that time involved in counseling/coordination of care.  Ned Card ANP/GNP-BC   06/09/2017  1:54 PM  This was a shared visit with Ned Card.  We reviewed the CT images, pathology report, and treatment options with Shawna Schneider and her son.  She has been diagnosed with metastatic adenocarcinoma, likely of GI origin.  I recommend treatment with FOLFOX.  We reviewed the potential toxicities associated with the FOLFOX regimen.  She would like to wait on beginning systemic therapy until we obtain additional information, specifically immunohistochemical stains on the omental biopsy and Foundation one testing results.  I will present her case at the GI tumor conference on 06/16/2017.  She will return for an office visit and further discussion 06/16/2017.  Julieanne Manson, MD

## 2017-06-09 NOTE — Telephone Encounter (Signed)
Gave avs and calendar for November  °

## 2017-06-09 NOTE — Progress Notes (Signed)
  Oncology Nurse Navigator Documentation  Navigator Location: [facility] (06/09/17 1504) Referral date to RadOnc/MedOnc: 06/02/17 (06/09/17 1504) )Navigator Encounter Type: Initial MedOnc (06/09/17 1504)   Abnormal Finding Date: 05/28/17 (06/09/17 1504) Confirmed Diagnosis Date: 05/28/17 (06/09/17 1504)               Patient Visit Type: MedOnc (06/09/17 1504)   Barriers/Navigation Needs: No Questions;No Needs;No barriers at this time (06/09/17 1504)   Interventions: Psycho-social support (06/09/17 1504)  I briefly met with patient and family member after patient was seen by NP. I provided my contact information and explained what my role would be in her tx. Plan: I will touch base with patient in the next few days by telephone.          Acuity: Level 1 (06/09/17 1504) Acuity Level 1: Initial guidance, education and coordination as needed (06/09/17 1504)       Time Spent with Patient: 15 (06/09/17 1504)

## 2017-06-10 NOTE — Progress Notes (Signed)
  Oncology Nurse Navigator Documentation  Navigator Location: [facility] (06/10/17 4492)   )Navigator Encounter Type: Telephone (06/10/17 0100) Telephone: [place] Call (06/10/17 0958)                     Treatment Phase: Pre-Tx/Tx Discussion (06/10/17 7121) Barriers/Navigation Needs: Education (06/10/17 9758) Education: Understanding Cancer/ Treatment Options;Coping with Diagnosis/ Prognosis;Newly Diagnosed Cancer Education (06/10/17 8325) Interventions: Education;Psycho-social support (06/10/17 4982)  I called patient to follow-up on patient's med/onc appointment with Dr. Benay Spice and Ned Card NP.  Patient had multiple questions regarding treatment option that was presented to her at her appointment.  Folfox regimen explained as well as common strategies for symptom management.  Patient encouraged to call with any additional questions or concerns.   Education Method: Verbal (06/10/17 6415)      Acuity: Level 2 (06/10/17 8309)   Acuity Level 2: Initial guidance, education and coordination as needed;Ongoing guidance and education throughout treatment as needed;Educational needs (06/10/17 4076)     Time Spent with Patient: 30 (06/10/17 8088)

## 2017-06-16 ENCOUNTER — Other Ambulatory Visit: Payer: BC Managed Care – PPO

## 2017-06-16 ENCOUNTER — Telehealth: Payer: Self-pay | Admitting: Oncology

## 2017-06-16 ENCOUNTER — Encounter: Payer: Self-pay | Admitting: *Deleted

## 2017-06-16 ENCOUNTER — Ambulatory Visit (HOSPITAL_BASED_OUTPATIENT_CLINIC_OR_DEPARTMENT_OTHER): Payer: BC Managed Care – PPO | Admitting: Oncology

## 2017-06-16 VITALS — BP 144/76 | HR 100 | Temp 98.1°F | Resp 18 | Ht 63.0 in | Wt 182.6 lb

## 2017-06-16 DIAGNOSIS — Z7189 Other specified counseling: Secondary | ICD-10-CM | POA: Insufficient documentation

## 2017-06-16 DIAGNOSIS — R911 Solitary pulmonary nodule: Secondary | ICD-10-CM | POA: Diagnosis not present

## 2017-06-16 DIAGNOSIS — C186 Malignant neoplasm of descending colon: Secondary | ICD-10-CM

## 2017-06-16 DIAGNOSIS — I82403 Acute embolism and thrombosis of unspecified deep veins of lower extremity, bilateral: Secondary | ICD-10-CM

## 2017-06-16 DIAGNOSIS — N289 Disorder of kidney and ureter, unspecified: Secondary | ICD-10-CM

## 2017-06-16 DIAGNOSIS — C800 Disseminated malignant neoplasm, unspecified: Secondary | ICD-10-CM | POA: Diagnosis not present

## 2017-06-16 DIAGNOSIS — C786 Secondary malignant neoplasm of retroperitoneum and peritoneum: Secondary | ICD-10-CM

## 2017-06-16 DIAGNOSIS — R599 Enlarged lymph nodes, unspecified: Secondary | ICD-10-CM | POA: Diagnosis not present

## 2017-06-16 DIAGNOSIS — K769 Liver disease, unspecified: Secondary | ICD-10-CM | POA: Diagnosis not present

## 2017-06-16 MED ORDER — LIDOCAINE-PRILOCAINE 2.5-2.5 % EX CREA: TOPICAL_CREAM | CUTANEOUS | 0 refills | Status: DC

## 2017-06-16 MED ORDER — PROCHLORPERAZINE MALEATE 5 MG PO TABS: 5.0000 mg | ORAL_TABLET | Freq: Four times a day (QID) | ORAL | 1 refills | Status: DC | PRN

## 2017-06-16 NOTE — Progress Notes (Signed)
  [place] OFFICE PROGRESS NOTE   Diagnosis: Unknown primary carcinoma  INTERVAL HISTORY:   Ms. Farewell returns as scheduled.  She reports frequent bowel movements.  No abdominal pain.  No bleeding.  No stroke symptoms.  She attended a chemotherapy teaching class earlier today.  She is here today with her son and daughter.  She has decided to proceed with FOLFOX chemotherapy.  Objective:  Vital signs in last 24 hours:  Blood pressure (!) 144/76, pulse 100, temperature 98.1 F (36.7 C), temperature source Oral, resp. rate 18, height 5\' 3"  (1.6 m), weight 182 lb 9.6 oz (82.8 kg), SpO2 98 %.    Resp: Lungs clear bilaterally Cardio: Regular rate and rhythm GI: No hepatomegaly, firm fullness with mild tenderness in the left lower abdomen, mildly distended Vascular: No leg edema   Lab Results:  Lab Results  Component Value Date   WBC 7.6 06/02/2017   HGB 9.9 (L) 06/02/2017   HCT 30.8 (L) 06/02/2017   MCV 83.9 06/02/2017   PLT 272 06/02/2017   NEUTROABS 6.4 05/28/2017    CMP     Component Value Date/Time   NA 140 06/02/2017 0425   K 4.0 06/02/2017 0425   CL 111 06/02/2017 0425   CO2 20 (L) 06/02/2017 0425   GLUCOSE 91 06/02/2017 0425   BUN 14 06/02/2017 0425   CREATININE 2.03 (H) 06/02/2017 0425   CALCIUM 9.8 06/02/2017 0425   PROT 7.0 05/24/2017 1320   ALBUMIN 3.4 (L) 05/24/2017 1320   AST 20 05/24/2017 1320   ALT 13 (L) 05/24/2017 1320   ALKPHOS 105 05/24/2017 1320   BILITOT 0.4 05/24/2017 1320   GFRNONAA 22 (L) 06/02/2017 0425   GFRAA 25 (L) 06/02/2017 0425    Lab Results  Component Value Date   CEA1 39.2 (H) 05/29/2017     Medications: I have reviewed the patient's current medications.  Assessment/Plan: 1. Metastatic adenocarcinoma  Colonoscopy 05/27/2017-left colon mass, biopsy confirmed a tubulovillous adenoma  CTs chest, abdomen, and pelvis 05/28/2017-ascites, carcinomatosis, lung nodules, fullness at the splenic  flexure  Biopsy of omental mass 06/02/2017--mucinous adenocarcinoma  2. 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 GI bleeding after TPA 05/24/2017-resolved  4. 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 Small PFO  6. Cervical cancer at age 58  7. Papillary renal cell carcinoma 2006, status post left nephrectomy  8.  Renal insufficiency   Disposition:  Ms. Wanninger appears unchanged.  I presented her case at the GI tumor conference earlier today.  We reviewed the pathology from the omental biopsy.  We reviewed imaging studies.  The consensus is that the metastatic adenocarcinoma is most likely related to a colon primary or another GI source.  We are waiting on immunohistochemical stains and Foundation one testing.  I recommend proceeding with FOLFOX chemotherapy.  We reviewed the potential toxicities associated with the FOLFOX regimen including the chance for nausea/vomiting, hematologic toxicity, diarrhea, skin toxicity, and allergic reaction, and neuropathy.  We discussed the potential for renal toxicity.  She agrees to proceed.  She will be referred for Port-A-Cath placement with the plan to begin cycle 1 FOLFOX 06/28/2017.  She will hold Eliquis for 2 days prior to Port-A-Cath placement.  30 minutes were spent with the patient today.  The majority of the time was used for counseling and coordination of care.  Betsy Coder, MD  06/16/2017  1:28 PM

## 2017-06-16 NOTE — Telephone Encounter (Signed)
Scheduled appt per 11/7 los - Gave patient AVS and calender per los.  

## 2017-06-16 NOTE — Progress Notes (Signed)
START OFF PATHWAY REGIMEN - [Other Dx]   [id number]:FOLFOX (q14d):   A cycle is every 14 days:     Oxaliplatin      Leucovorin      5-Fluorouracil      5-Fluorouracil   **Always confirm dose/schedule in your pharmacy ordering system**  Patient Characteristics: Intent of Therapy: Non-Curative / Palliative Intent, Discussed with Patient 

## 2017-06-21 ENCOUNTER — Other Ambulatory Visit: Payer: Self-pay | Admitting: Radiology

## 2017-06-21 ENCOUNTER — Other Ambulatory Visit: Payer: Self-pay | Admitting: Student

## 2017-06-21 NOTE — Progress Notes (Signed)
  Oncology Nurse Navigator Documentation  Navigator Location: [facility] (06/21/17 1551)   )Navigator Encounter Type: Telephone (06/21/17 1551) Telephone: [place] Call (06/21/17 1551)                     Treatment Phase: Pre-Tx/Tx Discussion (06/21/17 1551) Barriers/Navigation Needs: Education (06/21/17 1551) Education: Other(Education or port) (06/21/17 1551)  I called patient to provide education on Port. Patient encouraged to call with questions or concerns prior to her fist infusion on 06/28/17.     Education Method: Verbal (06/21/17 1551)      Acuity: Level 1 (06/21/17 1551)   Acuity Level 2: Ongoing guidance and education throughout treatment as needed (06/21/17 1551)     Time Spent with Patient: 15 (06/21/17 1551)

## 2017-06-22 ENCOUNTER — Other Ambulatory Visit: Payer: Self-pay | Admitting: General Surgery

## 2017-06-22 ENCOUNTER — Other Ambulatory Visit: Payer: Self-pay | Admitting: Radiology

## 2017-06-22 ENCOUNTER — Ambulatory Visit (HOSPITAL_COMMUNITY): Admission: RE | Admit: 2017-06-22 | Payer: BC Managed Care – PPO | Source: Ambulatory Visit

## 2017-06-22 ENCOUNTER — Encounter: Payer: Self-pay | Admitting: Oncology

## 2017-06-22 NOTE — Telephone Encounter (Signed)
Called pt, she reports pain began 2 days ago. Pain is worse when sitting. Standing or lying "stretched out" relieves pain. Denies any dyspnea. She took Tylenol today which helped. Instructed her to continue Tylenol for pain as needed. Call for worsening pain.  Message to MD for review.

## 2017-06-23 ENCOUNTER — Ambulatory Visit (HOSPITAL_COMMUNITY)
Admission: RE | Admit: 2017-06-23 | Discharge: 2017-06-23 | Disposition: A | Discharge status: Home / Self Care | Payer: BC Managed Care – PPO | Source: Ambulatory Visit | Attending: Oncology | Admitting: Oncology

## 2017-06-23 ENCOUNTER — Encounter (HOSPITAL_COMMUNITY): Payer: Self-pay

## 2017-06-23 ENCOUNTER — Other Ambulatory Visit: Payer: Self-pay | Admitting: Oncology

## 2017-06-23 DIAGNOSIS — Z7901 Long term (current) use of anticoagulants: Secondary | ICD-10-CM | POA: Diagnosis not present

## 2017-06-23 DIAGNOSIS — C186 Malignant neoplasm of descending colon: Secondary | ICD-10-CM

## 2017-06-23 DIAGNOSIS — C801 Malignant (primary) neoplasm, unspecified: Secondary | ICD-10-CM | POA: Insufficient documentation

## 2017-06-23 DIAGNOSIS — Z905 Acquired absence of kidney: Secondary | ICD-10-CM | POA: Diagnosis not present

## 2017-06-23 DIAGNOSIS — Z880 Allergy status to penicillin: Secondary | ICD-10-CM | POA: Diagnosis not present

## 2017-06-23 DIAGNOSIS — E785 Hyperlipidemia, unspecified: Secondary | ICD-10-CM | POA: Insufficient documentation

## 2017-06-23 HISTORY — PX: IR US GUIDE VASC ACCESS RIGHT: IMG2390

## 2017-06-23 HISTORY — PX: IR FLUORO GUIDE PORT INSERTION RIGHT: IMG5741

## 2017-06-23 LAB — CBC
HCT: 28.3 % — ABNORMAL LOW (ref 36.0–46.0)
HEMOGLOBIN: 9.1 g/dL — AB (ref 12.0–15.0)
MCH: 26.5 pg (ref 26.0–34.0)
MCHC: 32.2 g/dL (ref 30.0–36.0)
MCV: 82.5 fL (ref 78.0–100.0)
Platelets: 349 10*3/uL (ref 150–400)
RBC: 3.43 MIL/uL — AB (ref 3.87–5.11)
RDW: 18 % — ABNORMAL HIGH (ref 11.5–15.5)
WBC: 10 10*3/uL (ref 4.0–10.5)

## 2017-06-23 LAB — PROTIME-INR
INR: 1.18
PROTHROMBIN TIME: 14.9 s (ref 11.4–15.2)

## 2017-06-23 LAB — APTT: aPTT: 41 seconds — ABNORMAL HIGH (ref 24–36)

## 2017-06-23 MED ORDER — HEPARIN SOD (PORK) LOCK FLUSH 100 UNIT/ML IV SOLN
INTRAVENOUS | Status: AC
  Administered 2017-06-23: 500 [IU]
  Filled 2017-06-23: qty 5

## 2017-06-23 MED ORDER — SODIUM CHLORIDE 0.9 % IV SOLN: INTRAVENOUS | Status: DC

## 2017-06-23 MED ORDER — MIDAZOLAM HCL 2 MG/2ML IJ SOLN
INTRAMUSCULAR | Status: AC | PRN
  Administered 2017-06-23: 1 mg via INTRAVENOUS
  Administered 2017-06-23 (×2): 0.5 mg via INTRAVENOUS

## 2017-06-23 MED ORDER — VANCOMYCIN HCL IN DEXTROSE 1-5 GM/200ML-% IV SOLN
1000.0000 mg | INTRAVENOUS | Status: AC
  Administered 2017-06-23: 1000 mg via INTRAVENOUS

## 2017-06-23 MED ORDER — VANCOMYCIN HCL IN DEXTROSE 1-5 GM/200ML-% IV SOLN
INTRAVENOUS | Status: AC
  Filled 2017-06-23: qty 200

## 2017-06-23 MED ORDER — FENTANYL CITRATE (PF) 100 MCG/2ML IJ SOLN
INTRAMUSCULAR | Status: AC
  Filled 2017-06-23: qty 2

## 2017-06-23 MED ORDER — LIDOCAINE-EPINEPHRINE (PF) 1 %-1:200000 IJ SOLN
30.0000 mL | Freq: Once | INTRAMUSCULAR | Status: DC
  Filled 2017-06-23: qty 30

## 2017-06-23 MED ORDER — FENTANYL CITRATE (PF) 100 MCG/2ML IJ SOLN
INTRAMUSCULAR | Status: AC | PRN
  Administered 2017-06-23: 25 ug via INTRAVENOUS
  Administered 2017-06-23: 50 ug via INTRAVENOUS
  Administered 2017-06-23: 25 ug via INTRAVENOUS

## 2017-06-23 MED ORDER — LIDOCAINE-EPINEPHRINE (PF) 1 %-1:200000 IJ SOLN
INTRAMUSCULAR | Status: AC | PRN
  Administered 2017-06-23: 20 mL

## 2017-06-23 MED ORDER — MIDAZOLAM HCL 2 MG/2ML IJ SOLN
INTRAMUSCULAR | Status: AC
  Filled 2017-06-23: qty 2

## 2017-06-23 MED ORDER — SODIUM CHLORIDE 0.9 % IV SOLN
INTRAVENOUS | Status: AC | PRN
  Administered 2017-06-23: 10 mL/h via INTRAVENOUS

## 2017-06-23 NOTE — H&P (Signed)
Chief Complaint: Patient was seen in consultation today for metastatic adenocarcinoma  Referring Physician(s): Sherrill,Gary B  Supervising Physician: Daryll Brod  Patient Status: [facility] - Out-pt  History of Present Illness: Shawna Schneider is a 82 y.o. female with past medical history of abdominal pain was recently found to have a left colon mass.  Patient underwent CT-guided biopsy which showed metastatic adenocarcinoma of unknown primary.  She has plans for Palliative chemotherapy.  IR consulted for Port-A-Cath placement at the request of Dr. Benay Spice. She presents today in her usual state of health and without complaint.  She has been NPO.  She has held her blood thinner for the past 2 days.   Past Medical History:  Diagnosis Date  . Hyperlipemia   . Solitary kidney     Past Surgical History:  Procedure Laterality Date  . KIDNEY SURGERY      Allergies: Penicillins  Medications: Prior to Admission medications   Medication Sig Start Date End Date Taking? Authorizing Provider  acetaminophen (TYLENOL) 500 MG tablet Take 1,000 mg every 8 (eight) hours as needed by mouth for moderate pain.   Yes [provider]  allopurinol (ZYLOPRIM) 100 MG tablet Take 100 mg by mouth daily. 02/25/17  Yes [provider]  diltiazem (CARTIA XT) 180 MG 24 hr capsule Take 180 mg by mouth daily.   Yes [provider]  levothyroxine (SYNTHROID, LEVOTHROID) 75 MCG tablet Take 75 mcg by mouth daily. 04/16/17  Yes [provider]  lidocaine-prilocaine (EMLA) cream Apply to port site one hour prior to use. Do not rub in. Cover with plastic. 06/16/17  Yes Ladell Pier, MD  prednisoLONE acetate (PRED FORTE) 1 % ophthalmic suspension Place 1 drop into the left eye daily as needed.  03/19/17  Yes [provider]  prochlorperazine (COMPAZINE) 5 MG tablet Take 1 tablet (5 mg total) every 6 (six) hours as needed by mouth for nausea or vomiting. 06/16/17  Yes  Ladell Pier, MD  rosuvastatin (CRESTOR) 20 MG tablet Take 5 mg daily by mouth. Pt breaks meds into 1/4   Yes [provider]  valsartan-hydrochlorothiazide (DIOVAN-HCT) 320-12.5 MG tablet Take 1 tablet by mouth daily. 04/16/17  Yes [provider]  apixaban (ELIQUIS) 2.5 MG TABS tablet Take 1 tablet (2.5 mg total) by mouth 2 (two) times daily. 06/03/17   Shawna Hawking, MD     History reviewed. No pertinent family history.  Social History   Socioeconomic History  . Marital status: Divorced    Spouse name: None  . Number of children: None  . Years of education: None  . Highest education level: None  Social Needs  . Financial resource strain: None  . Food insecurity - worry: None  . Food insecurity - inability: None  . Transportation needs - medical: None  . Transportation needs - non-medical: None  Occupational History  . None  Tobacco Use  . Smoking status: Never Smoker  . Smokeless tobacco: Never Used  Substance and Sexual Activity  . Alcohol use: No  . Drug use: No  . Sexual activity: None  Other Topics Concern  . None  Social History Narrative  . None    Review of Systems  Constitutional: Negative for fatigue and fever.  Respiratory: Negative for cough and shortness of breath.   Cardiovascular: Negative for chest pain.  Gastrointestinal: Positive for abdominal pain.  Psychiatric/Behavioral: Negative for behavioral problems and confusion.    Vital Signs: BP 103/66   Pulse 82  Temp 97.6 F (36.4 C) (Oral)   Resp 16   Ht 5\' 3"  (1.6 m)   Wt 189 lb (85.7 kg)   SpO2 100%   BMI 33.48 kg/m   Physical Exam  Constitutional: She is oriented to person, place, and time. She appears well-developed.  Cardiovascular: Normal rate, regular rhythm and normal heart sounds.  Pulmonary/Chest: Effort normal and breath sounds normal. No respiratory distress.  Abdominal: Soft.  Neurological: She is alert and oriented to person, place, and time.  Skin: Skin  is warm and dry.  Psychiatric: She has a normal mood and affect. Her behavior is normal. Judgment and thought content normal.  Nursing note and vitals reviewed.   Imaging: Ct Abdomen Pelvis Wo Contrast  Result Date: 05/28/2017 CLINICAL DATA:  Colon cancer staging. Elevated creatinine. Malignant neoplasm of descending colon. EXAM: CT CHEST, ABDOMEN AND PELVIS WITHOUT CONTRAST TECHNIQUE: Multidetector CT imaging of the chest, abdomen and pelvis was performed following the standard protocol without IV contrast. COMPARISON:  12/14/2011 abdominopelvic CT. Chest radiograph 05/19/2005 reviewed. FINDINGS: CT CHEST FINDINGS Cardiovascular: Tortuous thoracic aorta. Aortic atherosclerosis. Normal heart size, without pericardial effusion. Multivessel coronary artery atherosclerosis. Mediastinum/Nodes: No mediastinal or definite hilar adenopathy, given limitations of unenhanced CT. Lungs/Pleura: No pleural fluid. Bilateral pulmonary nodules. Index right middle lobe nodule measures 2.0 cm on image 63/series 5. An index left upper lobe 2.0 cm nodule on image 52/series 5. Other smaller bilateral pulmonary nodules. Musculoskeletal: No acute osseous abnormality. CT ABDOMEN PELVIS FINDINGS Hepatobiliary: Irregular hepatic capsule, favored to be related to the capsular metastasis. No convincing evidence of intraparenchymal liver lesions. Normal gallbladder, without biliary ductal dilatation. Pancreas: Normal pancreas for age. Spleen: Normal in size, without focal abnormality. Adrenals/Urinary Tract: Normal right adrenal gland. Left adrenal thickening. Left nephrectomy. Multiple renal lesions. hyperattenuating lesions including the upper pole 2.4 cm in 42 HU on image 57/series 3, new. No hydronephrosis. Normal urinary bladder. Stomach/Bowel: Normal stomach, without wall thickening. Extensive colonic diverticulosis. Possible soft tissue fullness in the region of the splenic flexure, including on image 56/series 3. No  obstruction. Normal small bowel caliber. Vascular/Lymphatic: Aortic and branch vessel atherosclerosis. Borderline enlarged left periaortic node of 10 mm on image 68/series 3, new. Probable left external iliac adenopathy at 1.5 cm on image 110 100/series 3. Reproductive: Uterus not well evaluated. Other: Small 2 moderate volume abdominopelvic fluid with extensive omental/ peritoneal metastasis. An index area of left abdominal omental nodularity measures on the order of 3.1 cm on image 74/series 3. A combination of right omental nodularity and fluid measures maximally 5.7 cm on image 81/series 3. Musculoskeletal: Bilateral hip osteoarthritis. IMPRESSION: CT CHEST IMPRESSION 1. Bilateral pulmonary nodules, consistent with metastatic disease. 2. No thoracic adenopathy. 3. Coronary artery atherosclerosis. Aortic Atherosclerosis (ICD10-I70.0). CT ABDOMEN AND PELVIS IMPRESSION 1. Extensive, large volume peritoneal/omental metastasis. This is presumably related to the primary colon mass. A synchronous metastatic ovarian carcinoma or primary peritoneal carcinoma could look similar. 2. New borderline abdominal and probable left external iliac adenopathy, suspicious for nodal metastasis. 3. Left nephrectomy. Multiple indeterminate right renal lesions which could represent complex cysts or 1 or more solid neoplasms. Of questionable clinical significance, given comorbidities. If further evaluation is desired, outpatient pre and post contrast abdominal MRI or renal protocol CT could be performed. 4. No bowel obstruction or other acute complication. Electronically Signed   By: Abigail Miyamoto M.D.   On: 05/28/2017 20:35   Ct Head Wo Contrast  Result Date: 05/25/2017 CLINICAL DATA:  Continued surveillance of stroke.  Expressive aphasia. Post tPA. Patient developed lower GI bleed. EXAM: CT HEAD WITHOUT CONTRAST TECHNIQUE: Contiguous axial images were obtained from the base of the skull through the vertex without intravenous  contrast. COMPARISON:  CT head 05/24/2017.  MR brain 05/24/2017. FINDINGS: Brain: Mild atrophy is redemonstrated. Early cytotoxic edema associated with the small acute infarcts in the LEFT cerebellum, now visible on CT. Areas of low level restricted diffusion in the insula, posterior frontal cortex, and subcortical white matter less well visualized. No hemorrhagic transformation. Stable atrophy with small vessel disease. Vascular: Calcification of the cavernous internal carotid arteries consistent with cerebrovascular atherosclerotic disease. No signs of intracranial large vessel occlusion. Skull: Normal. Negative for fracture or focal lesion. Sinuses/Orbits: No acute finding. Other: None. IMPRESSION: Developing cytotoxic edema associated with small acute infarcts LEFT cerebellum. Similar areas of cytotoxic edema in the LEFT cerebral hemisphere not well visualized. No areas of concern for post tPA hemorrhage. Electronically Signed   By: Staci Righter M.D.   On: 05/25/2017 15:12   Ct Chest Wo Contrast  Result Date: 05/28/2017 CLINICAL DATA:  Colon cancer staging. Elevated creatinine. Malignant neoplasm of descending colon. EXAM: CT CHEST, ABDOMEN AND PELVIS WITHOUT CONTRAST TECHNIQUE: Multidetector CT imaging of the chest, abdomen and pelvis was performed following the standard protocol without IV contrast. COMPARISON:  12/14/2011 abdominopelvic CT. Chest radiograph 05/19/2005 reviewed. FINDINGS: CT CHEST FINDINGS Cardiovascular: Tortuous thoracic aorta. Aortic atherosclerosis. Normal heart size, without pericardial effusion. Multivessel coronary artery atherosclerosis. Mediastinum/Nodes: No mediastinal or definite hilar adenopathy, given limitations of unenhanced CT. Lungs/Pleura: No pleural fluid. Bilateral pulmonary nodules. Index right middle lobe nodule measures 2.0 cm on image 63/series 5. An index left upper lobe 2.0 cm nodule on image 52/series 5. Other smaller bilateral pulmonary nodules.  Musculoskeletal: No acute osseous abnormality. CT ABDOMEN PELVIS FINDINGS Hepatobiliary: Irregular hepatic capsule, favored to be related to the capsular metastasis. No convincing evidence of intraparenchymal liver lesions. Normal gallbladder, without biliary ductal dilatation. Pancreas: Normal pancreas for age. Spleen: Normal in size, without focal abnormality. Adrenals/Urinary Tract: Normal right adrenal gland. Left adrenal thickening. Left nephrectomy. Multiple renal lesions. hyperattenuating lesions including the upper pole 2.4 cm in 42 HU on image 57/series 3, new. No hydronephrosis. Normal urinary bladder. Stomach/Bowel: Normal stomach, without wall thickening. Extensive colonic diverticulosis. Possible soft tissue fullness in the region of the splenic flexure, including on image 56/series 3. No obstruction. Normal small bowel caliber. Vascular/Lymphatic: Aortic and branch vessel atherosclerosis. Borderline enlarged left periaortic node of 10 mm on image 68/series 3, new. Probable left external iliac adenopathy at 1.5 cm on image 110 100/series 3. Reproductive: Uterus not well evaluated. Other: Small 2 moderate volume abdominopelvic fluid with extensive omental/ peritoneal metastasis. An index area of left abdominal omental nodularity measures on the order of 3.1 cm on image 74/series 3. A combination of right omental nodularity and fluid measures maximally 5.7 cm on image 81/series 3. Musculoskeletal: Bilateral hip osteoarthritis. IMPRESSION: CT CHEST IMPRESSION 1. Bilateral pulmonary nodules, consistent with metastatic disease. 2. No thoracic adenopathy. 3. Coronary artery atherosclerosis. Aortic Atherosclerosis (ICD10-I70.0). CT ABDOMEN AND PELVIS IMPRESSION 1. Extensive, large volume peritoneal/omental metastasis. This is presumably related to the primary colon mass. A synchronous metastatic ovarian carcinoma or primary peritoneal carcinoma could look similar. 2. New borderline abdominal and probable left  external iliac adenopathy, suspicious for nodal metastasis. 3. Left nephrectomy. Multiple indeterminate right renal lesions which could represent complex cysts or 1 or more solid neoplasms. Of questionable clinical significance, given comorbidities. If further evaluation is  desired, outpatient pre and post contrast abdominal MRI or renal protocol CT could be performed. 4. No bowel obstruction or other acute complication. Electronically Signed   By: Abigail Miyamoto M.D.   On: 05/28/2017 20:35   Mr Angiogram Head Wo Contrast  Result Date: 05/24/2017 CLINICAL DATA:  82 y/o  F; expressive aphasia. EXAM: MRI HEAD WITHOUT CONTRAST MRA HEAD WITHOUT CONTRAST TECHNIQUE: Multiplanar, multiecho pulse sequences of the brain and surrounding structures were obtained without intravenous contrast. Angiographic images of the head were obtained using MRA technique without contrast. COMPARISON:  05/24/2017 CT head. FINDINGS: MRI HEAD FINDINGS Brain: Small region of reduced diffusion involving the left posterior insula and extending into the left frontoparietal junction cortex compatible with acute/early subacute infarction. Several additional punctate foci of reduced diffusion are present in the bilateral frontal and parietal lobes as well as bilateral cerebellar hemispheres. In the left posterior parietal lobe there is a small region of diffusion and T2 FLAIR hyperintensity without reduced diffusion on ADC probably representing a subacute infarction. There additional foci of T2 FLAIR hyperintense signal abnormality in subcortical periventricular white matter throughout the supratentorial brain that are nonspecific but likely represent moderate chronic microvascular ischemic changes. Moderate brain parenchymal volume loss. Vascular: As below. Skull and upper cervical spine: Normal marrow signal. Sinuses/Orbits: Negative. Other: None. MRA HEAD FINDINGS Anterior circulation: Right cavernous ICA laterally directed 2 mm sacculation. No  additional finding for occlusion, aneurysm, or significant stenosis in bilateral ICA, bilateral ACA, and right MCA. Patent left M1 and proximal M2 branches. Poor flow related signal within left M2 inferior division (series 4, image 105 and series 406, image 5). Posterior circulation: No large vessel occlusion, aneurysm, or significant stenosis is identified. Anatomic variation: Patent anterior communicating artery. Bilateral fetal PCA. IMPRESSION: MRI head: 1. Small region of acute/early subacute infarction within the left posterior insula extending into left frontoparietal junction. 2. Additional scattered punctate foci of reduced diffusion are present in the bilateral convexities and cerebellar hemispheres. Multiple vascular territories suggests embolic phenomenon. 3. Late subacute to chronic infarct within left occipital lobe. 4. Moderate chronic microvascular ischemic changes and moderate parenchymal volume loss of the brain. MRA head: 1. Poor flow related signal within left MCA M2 inferior sylvian division likely representing thromboembolic occlusion. 2. Otherwise patent circle of Willis. No aneurysm or high-grade stenosis. These results will be called to the ordering clinician or representative by the Radiologist Assistant, and communication documented in the PACS or zVision Dashboard. Electronically Signed   By: Kristine Garbe M.D.   On: 05/24/2017 15:28   Mr Brain Wo Contrast  Result Date: 05/24/2017 CLINICAL DATA:  82 y/o  F; expressive aphasia. EXAM: MRI HEAD WITHOUT CONTRAST MRA HEAD WITHOUT CONTRAST TECHNIQUE: Multiplanar, multiecho pulse sequences of the brain and surrounding structures were obtained without intravenous contrast. Angiographic images of the head were obtained using MRA technique without contrast. COMPARISON:  05/24/2017 CT head. FINDINGS: MRI HEAD FINDINGS Brain: Small region of reduced diffusion involving the left posterior insula and extending into the left  frontoparietal junction cortex compatible with acute/early subacute infarction. Several additional punctate foci of reduced diffusion are present in the bilateral frontal and parietal lobes as well as bilateral cerebellar hemispheres. In the left posterior parietal lobe there is a small region of diffusion and T2 FLAIR hyperintensity without reduced diffusion on ADC probably representing a subacute infarction. There additional foci of T2 FLAIR hyperintense signal abnormality in subcortical periventricular white matter throughout the supratentorial brain that are nonspecific but likely represent moderate  chronic microvascular ischemic changes. Moderate brain parenchymal volume loss. Vascular: As below. Skull and upper cervical spine: Normal marrow signal. Sinuses/Orbits: Negative. Other: None. MRA HEAD FINDINGS Anterior circulation: Right cavernous ICA laterally directed 2 mm sacculation. No additional finding for occlusion, aneurysm, or significant stenosis in bilateral ICA, bilateral ACA, and right MCA. Patent left M1 and proximal M2 branches. Poor flow related signal within left M2 inferior division (series 4, image 105 and series 406, image 5). Posterior circulation: No large vessel occlusion, aneurysm, or significant stenosis is identified. Anatomic variation: Patent anterior communicating artery. Bilateral fetal PCA. IMPRESSION: MRI head: 1. Small region of acute/early subacute infarction within the left posterior insula extending into left frontoparietal junction. 2. Additional scattered punctate foci of reduced diffusion are present in the bilateral convexities and cerebellar hemispheres. Multiple vascular territories suggests embolic phenomenon. 3. Late subacute to chronic infarct within left occipital lobe. 4. Moderate chronic microvascular ischemic changes and moderate parenchymal volume loss of the brain. MRA head: 1. Poor flow related signal within left MCA M2 inferior sylvian division likely  representing thromboembolic occlusion. 2. Otherwise patent circle of Willis. No aneurysm or high-grade stenosis. These results will be called to the ordering clinician or representative by the Radiologist Assistant, and communication documented in the PACS or zVision Dashboard. Electronically Signed   By: Kristine Garbe M.D.   On: 05/24/2017 15:28   Ct Biopsy  Result Date: 06/02/2017 INDICATION: Ascites and omental masses, unknown primary EXAM: CT RIGHT OMENTAL MASS CORE BIOPSY MEDICATIONS: 1% LIDOCAINE LOCAL ANESTHESIA/SEDATION: Moderate (conscious) sedation was employed during this procedure. A total of Versed 1.0 mg and Fentanyl 50 mcg was administered intravenously. Moderate Sedation Time: 14 minutes. The patient's level of consciousness and vital signs were monitored continuously by radiology nursing throughout the procedure under my direct supervision. FLUOROSCOPY TIME:  Fluoroscopy Time: NONE. COMPLICATIONS: None immediate. PROCEDURE: Informed written consent was obtained from the patient after a thorough discussion of the procedural risks, benefits and alternatives. All questions were addressed. Maximal Sterile Barrier Technique was utilized including caps, mask, sterile gowns, sterile gloves, sterile drape, hand hygiene and skin antiseptic. A timeout was performed prior to the initiation of the procedure. previous imaging reviewed. patient positioned supine. noncontrast localization ct performed. the right abdominal omental mass with overlying ascites was localized. overlying skin marked. under sterile conditions and local anesthesia, a 17 gauge 6.8 cm access needle was advanced from an anterior approach into the omental mass. needle position confirmed with ct. 18 gauge core biopsies obtained. samples were placed in formalin. needle removed. postprocedure imaging demonstrates no hemorrhage or hematoma. patient tolerated the biopsy well. IMPRESSION: Successful CT-guided right omental mass 18  gauge core biopsy Electronically Signed   By: Jerilynn Mages.  Shick M.D.   On: 06/02/2017 09:18   Ct Head Code Stroke Wo Contrast`  Result Date: 05/24/2017 CLINICAL DATA:  Code stroke.  Aphasia and confusion. EXAM: CT HEAD WITHOUT CONTRAST TECHNIQUE: Contiguous axial images were obtained from the base of the skull through the vertex without intravenous contrast. COMPARISON:  None. FINDINGS: Brain: Moderate diffuse white matter changes are present. No acute or focal cortical abnormality is present. The basal ganglia are intact. The insular ribbon is normal bilaterally. The brainstem and cerebellum are normal. Vascular: Atherosclerotic calcifications are present within the cavernous internal carotid artery is bilaterally. There is no hyperdense vessel. Skull: The calvarium is intact. No focal lytic or blastic lesions are present. Sinuses/Orbits: The paranasal sinuses and mastoid air cells are clear. Bilateral lens replacements are present.  The globes and orbits are within normal limits bilaterally. ASPECTS [facility] Stroke Program Early CT Score) - Ganglionic level infarction (caudate, lentiform nuclei, internal capsule, insula, M1-M3 cortex): 7/7 - Supraganglionic infarction (M4-M6 cortex): 3/3 Total score (0-10 with 10 being normal): 10/10 IMPRESSION: 1. No acute intracranial abnormality. 2. ASPECTS is 10/10 These results were called by telephone at the time of interpretation on 05/24/2017 at 1:53 pm to Dr. Rory Percy, who verbally acknowledged these results. Electronically Signed   By: San Morelle M.D.   On: 05/24/2017 13:53    Labs:  CBC: Recent Labs    05/28/17 1220 05/31/17 0320 06/02/17 0425 06/23/17 0743  WBC 9.3 7.3 7.6 10.0  HGB 10.8* 9.9* 9.9* 9.1*  HCT 33.9* 30.5* 30.8* 28.3*  PLT 238 235 272 349    COAGS: Recent Labs    05/24/17 1320 06/02/17 0425 06/23/17 0743  INR 1.11 1.07 1.18  APTT 33  --  41*    BMP: Recent Labs    05/24/17 1320 05/24/17 1329 05/26/17 0459 05/31/17 0320  06/02/17 0425  NA 138 140 137 138 140  K 3.8 3.9 4.1 4.0 4.0  CL 112* 114* 114* 112* 111  CO2 16*  --  16* 18* 20*  GLUCOSE 90 85 100* 92 91  BUN 37* 35* 35* 18 14  CALCIUM 10.5*  --  9.6 9.4 9.8  CREATININE 3.22* 3.20* 2.67* 2.17* 2.03*  GFRNONAA 13*  --  16* 20* 22*  GFRAA 15*  --  18* 24* 25*    LIVER FUNCTION TESTS: Recent Labs    05/24/17 1320  BILITOT 0.4  AST 20  ALT 13*  ALKPHOS 105  PROT 7.0  ALBUMIN 3.4*    TUMOR MARKERS: No results for input(s): AFPTM, CEA, CA199, CHROMGRNA in the last 8760 hours.  Assessment and Plan: Patient with past medical history of colon mass presents with complaint of poor venous access.  IR consulted for Port-A-Cath placement at the request of Dr. Benay Spice. She has been NPO and has appropriately held her blood thinners.  Risks and benefits discussed with the patient including, but not limited to bleeding, infection, pneumothorax, or fibrin sheath development and need for additional procedures. All of the patient's questions were answered, patient is agreeable to proceed. Consent signed and in chart.  Thank you for this interesting consult.  I greatly enjoyed meeting SHARENE KRIKORIAN and look forward to participating in their care.  A copy of this report was sent to the requesting provider on this date.  Electronically Signed: Docia Barrier, PA 06/23/2017, 8:44 AM   I spent a total of    15 Minutes in face to face in clinical consultation, greater than 50% of which was counseling/coordinating care for metastatic adenocarcinoma.

## 2017-06-23 NOTE — Procedures (Signed)
adenoca unknown primary  S/p RT IJ PORT  TIP SVCRA NO COMP STABLE EBL 0 READY FOR USE FULL REPORT IN PACS

## 2017-06-23 NOTE — Discharge Instructions (Addendum)
Implanted Port Home Guide °An implanted port is a type of central line that is placed under the skin. Central lines are used to provide IV access when treatment or nutrition needs to be given through a person’s veins. Implanted ports are used for long-term IV access. An implanted port may be placed because: °· You need IV medicine that would be irritating to the small veins in your hands or arms. °· You need long-term IV medicines, such as antibiotics. °· You need IV nutrition for a long period. °· You need frequent blood draws for lab tests. °· You need dialysis. ° °Implanted ports are usually placed in the chest area, but they can also be placed in the upper arm, the abdomen, or the leg. An implanted port has two main parts: °· Reservoir. The reservoir is round and will appear as a small, raised area under your skin. The reservoir is the part where a needle is inserted to give medicines or draw blood. °· Catheter. The catheter is a thin, flexible tube that extends from the reservoir. The catheter is placed into a large vein. Medicine that is inserted into the reservoir goes into the catheter and then into the vein. ° °How will I care for my incision site? °Do not get the incision site wet. Bathe or shower as directed by your health care provider. °How is my port accessed? °Special steps must be taken to access the port: °· Before the port is accessed, a numbing cream can be placed on the skin. This helps numb the skin over the port site. °· Your health care provider uses a sterile technique to access the port. °? Your health care provider must put on a mask and sterile gloves. °? The skin over your port is cleaned carefully with an antiseptic and allowed to dry. °? The port is gently pinched between sterile gloves, and a needle is inserted into the port. °· Only "non-coring" port needles should be used to access the port. Once the port is accessed, a blood return should be checked. This helps ensure that the port  is in the vein and is not clogged. °· If your port needs to remain accessed for a constant infusion, a clear (transparent) bandage will be placed over the needle site. The bandage and needle will need to be changed every week, or as directed by your health care provider. °· Keep the bandage covering the needle clean and dry. Do not get it wet. Follow your health care provider’s instructions on how to take a shower or bath while the port is accessed. °· If your port does not need to stay accessed, no bandage is needed over the port. ° °What is flushing? °Flushing helps keep the port from getting clogged. Follow your health care provider’s instructions on how and when to flush the port. Ports are usually flushed with saline solution or a medicine called heparin. The need for flushing will depend on how the port is used. °· If the port is used for intermittent medicines or blood draws, the port will need to be flushed: °? After medicines have been given. °? After blood has been drawn. °? As part of routine maintenance. °· If a constant infusion is running, the port may not need to be flushed. ° °How long will my port stay implanted? °The port can stay in for as long as your health care provider thinks it is needed. When it is time for the port to come out, surgery will be   done to remove it. The procedure is similar to the one performed when the port was put in. °When should I seek immediate medical care? °When you have an implanted port, you should seek immediate medical care if: °· You notice a bad smell coming from the incision site. °· You have swelling, redness, or drainage at the incision site. °· You have more swelling or pain at the port site or the surrounding area. °· You have a fever that is not controlled with medicine. ° °This information is not intended to replace advice given to you by your health care provider. Make sure you discuss any questions you have with your health care provider. °Document  Released: 07/27/2005 Document Revised: 01/02/2016 Document Reviewed: 04/03/2013 °[employer]vier Interactive Patient Education © 2017 [employer] °Moderate Conscious Sedation, Adult, Care After °These instructions provide you with information about caring for yourself after your procedure. Your health care provider may also give you more specific instructions. Your treatment has been planned according to current medical practices, but problems sometimes occur. Call your health care provider if you have any problems or questions after your procedure. °What can I expect after the procedure? °After your procedure, it is common: °· To feel sleepy for several hours. °· To feel clumsy and have poor balance for several hours. °· To have poor judgment for several hours. °· To vomit if you eat too soon. ° °Follow these instructions at home: °For at least 24 hours after the procedure: ° °· Do not: °? Participate in activities where you could fall or become injured. °? Drive. °? Use heavy machinery. °? Drink alcohol. °? Take sleeping pills or medicines that cause drowsiness. °? Make important decisions or sign legal documents. °? Take care of children on your own. °· Rest. °Eating and drinking °· Follow the diet recommended by your health care provider. °· If you vomit: °? Drink water, juice, or soup when you can drink without vomiting. °? Make sure you have little or no nausea before eating solid foods. °General instructions °· Have a responsible adult stay with you until you are awake and alert. °· Take over-the-counter and prescription medicines only as told by your health care provider. °· If you smoke, do not smoke without supervision. °· Keep all follow-up visits as told by your health care provider. This is important. °Contact a health care provider if: °· You keep feeling nauseous or you keep vomiting. °· You feel light-headed. °· You develop a rash. °· You have a fever. °Get help right away if: °· You have trouble  breathing. °This information is not intended to replace advice given to you by your health care provider. Make sure you discuss any questions you have with your health care provider. °Document Released: 05/17/2013 Document Revised: 12/30/2015 Document Reviewed: 11/16/2015 °Elsevier Interactive Patient Education © 2018 [employer] ° °

## 2017-06-25 ENCOUNTER — Encounter (HOSPITAL_COMMUNITY): Payer: Self-pay

## 2017-06-26 ENCOUNTER — Encounter: Payer: Self-pay | Admitting: Oncology

## 2017-06-27 ENCOUNTER — Other Ambulatory Visit: Payer: Self-pay | Admitting: Oncology

## 2017-06-28 ENCOUNTER — Encounter: Payer: Self-pay | Admitting: Oncology

## 2017-06-28 ENCOUNTER — Ambulatory Visit: Payer: BC Managed Care – PPO

## 2017-06-28 ENCOUNTER — Ambulatory Visit (HOSPITAL_BASED_OUTPATIENT_CLINIC_OR_DEPARTMENT_OTHER): Payer: BC Managed Care – PPO | Admitting: Nurse Practitioner

## 2017-06-28 ENCOUNTER — Ambulatory Visit (HOSPITAL_BASED_OUTPATIENT_CLINIC_OR_DEPARTMENT_OTHER): Payer: BC Managed Care – PPO

## 2017-06-28 ENCOUNTER — Other Ambulatory Visit (HOSPITAL_BASED_OUTPATIENT_CLINIC_OR_DEPARTMENT_OTHER): Payer: BC Managed Care – PPO

## 2017-06-28 ENCOUNTER — Encounter: Payer: Self-pay | Admitting: Nurse Practitioner

## 2017-06-28 VITALS — BP 108/72 | HR 104 | Temp 98.6°F | Resp 18 | Ht 63.0 in | Wt 177.5 lb

## 2017-06-28 DIAGNOSIS — Z5111 Encounter for antineoplastic chemotherapy: Secondary | ICD-10-CM | POA: Diagnosis not present

## 2017-06-28 DIAGNOSIS — R197 Diarrhea, unspecified: Secondary | ICD-10-CM

## 2017-06-28 DIAGNOSIS — C801 Malignant (primary) neoplasm, unspecified: Secondary | ICD-10-CM

## 2017-06-28 DIAGNOSIS — N289 Disorder of kidney and ureter, unspecified: Secondary | ICD-10-CM | POA: Diagnosis not present

## 2017-06-28 DIAGNOSIS — C786 Secondary malignant neoplasm of retroperitoneum and peritoneum: Secondary | ICD-10-CM

## 2017-06-28 DIAGNOSIS — M25552 Pain in left hip: Secondary | ICD-10-CM | POA: Diagnosis not present

## 2017-06-28 DIAGNOSIS — C800 Disseminated malignant neoplasm, unspecified: Secondary | ICD-10-CM

## 2017-06-28 DIAGNOSIS — C186 Malignant neoplasm of descending colon: Secondary | ICD-10-CM

## 2017-06-28 DIAGNOSIS — M79605 Pain in left leg: Secondary | ICD-10-CM | POA: Diagnosis not present

## 2017-06-28 DIAGNOSIS — C799 Secondary malignant neoplasm of unspecified site: Principal | ICD-10-CM

## 2017-06-28 DIAGNOSIS — C189 Malignant neoplasm of colon, unspecified: Secondary | ICD-10-CM

## 2017-06-28 DIAGNOSIS — I82403 Acute embolism and thrombosis of unspecified deep veins of lower extremity, bilateral: Secondary | ICD-10-CM | POA: Diagnosis not present

## 2017-06-28 LAB — CBC WITH DIFFERENTIAL/PLATELET
BASO%: 0.1 % (ref 0.0–2.0)
BASOS ABS: 0 10*3/uL (ref 0.0–0.1)
EOS%: 2 % (ref 0.0–7.0)
Eosinophils Absolute: 0.2 10*3/uL (ref 0.0–0.5)
HEMATOCRIT: 27.4 % — AB (ref 34.8–46.6)
HEMOGLOBIN: 8.7 g/dL — AB (ref 11.6–15.9)
LYMPH#: 1.2 10*3/uL (ref 0.9–3.3)
LYMPH%: 14.4 % (ref 14.0–49.7)
MCH: 26.6 pg (ref 25.1–34.0)
MCHC: 31.8 g/dL (ref 31.5–36.0)
MCV: 83.8 fL (ref 79.5–101.0)
MONO#: 0.7 10*3/uL (ref 0.1–0.9)
MONO%: 8.5 % (ref 0.0–14.0)
NEUT%: 75 % (ref 38.4–76.8)
NEUTROS ABS: 6.3 10*3/uL (ref 1.5–6.5)
Platelets: 356 10*3/uL (ref 145–400)
RBC: 3.27 10*6/uL — ABNORMAL LOW (ref 3.70–5.45)
RDW: 18.4 % — AB (ref 11.2–14.5)
WBC: 8.4 10*3/uL (ref 3.9–10.3)

## 2017-06-28 LAB — COMPREHENSIVE METABOLIC PANEL
ALT: 40 U/L (ref 0–55)
ANION GAP: 10 meq/L (ref 3–11)
AST: 44 U/L — ABNORMAL HIGH (ref 5–34)
Albumin: 2.7 g/dL — ABNORMAL LOW (ref 3.5–5.0)
Alkaline Phosphatase: 225 U/L — ABNORMAL HIGH (ref 40–150)
BILIRUBIN TOTAL: 0.29 mg/dL (ref 0.20–1.20)
BUN: 42.2 mg/dL — ABNORMAL HIGH (ref 7.0–26.0)
CALCIUM: 10.5 mg/dL — AB (ref 8.4–10.4)
CO2: 18 meq/L — AB (ref 22–29)
CREATININE: 2.6 mg/dL — AB (ref 0.6–1.1)
Chloride: 112 mEq/L — ABNORMAL HIGH (ref 98–109)
EGFR: 19 mL/min/{1.73_m2} — AB (ref >60)
Glucose: 94 mg/dl (ref 70–140)
Potassium: 3.7 mEq/L (ref 3.5–5.1)
Sodium: 140 mEq/L (ref 136–145)
TOTAL PROTEIN: 7.2 g/dL (ref 6.4–8.3)

## 2017-06-28 LAB — CEA (IN HOUSE-CHCC): CEA (CHCC-IN HOUSE): 55.8 ng/mL — AB (ref 0.00–5.00)

## 2017-06-28 MED ORDER — DEXAMETHASONE SODIUM PHOSPHATE 10 MG/ML IJ SOLN
10.0000 mg | Freq: Once | INTRAMUSCULAR | Status: AC
  Administered 2017-06-28: 10 mg via INTRAVENOUS

## 2017-06-28 MED ORDER — HYDROCODONE-ACETAMINOPHEN 5-325 MG PO TABS: 0.5000 | ORAL_TABLET | Freq: Four times a day (QID) | ORAL | 0 refills | Status: DC | PRN

## 2017-06-28 MED ORDER — DEXAMETHASONE SODIUM PHOSPHATE 10 MG/ML IJ SOLN: 10.0000 mg | Freq: Once | INTRAMUSCULAR | Status: DC

## 2017-06-28 MED ORDER — OXALIPLATIN CHEMO INJECTION 100 MG/20ML
65.0000 mg/m2 | Freq: Once | INTRAVENOUS | Status: AC
  Administered 2017-06-28: 125 mg via INTRAVENOUS
  Filled 2017-06-28: qty 25

## 2017-06-28 MED ORDER — HYDROCODONE-ACETAMINOPHEN 5-325 MG PO TABS
ORAL_TABLET | ORAL | Status: AC
  Filled 2017-06-28: qty 1

## 2017-06-28 MED ORDER — LEUCOVORIN CALCIUM INJECTION 350 MG
400.0000 mg/m2 | Freq: Once | INTRAVENOUS | Status: AC
  Administered 2017-06-28: 768 mg via INTRAVENOUS
  Filled 2017-06-28: qty 38.4

## 2017-06-28 MED ORDER — SODIUM CHLORIDE 0.9% FLUSH
10.0000 mL | Freq: Once | INTRAVENOUS | Status: AC
  Administered 2017-06-28: 10 mL
  Filled 2017-06-28: qty 10

## 2017-06-28 MED ORDER — HYDROCODONE-ACETAMINOPHEN 5-325 MG PO TABS
0.5000 | ORAL_TABLET | Freq: Once | ORAL | Status: AC
  Administered 2017-06-28: 0.5 via ORAL

## 2017-06-28 MED ORDER — SODIUM CHLORIDE 0.9 % IV SOLN: 10.0000 mg | Freq: Once | INTRAVENOUS | Status: DC

## 2017-06-28 MED ORDER — PALONOSETRON HCL INJECTION 0.25 MG/5ML
0.2500 mg | Freq: Once | INTRAVENOUS | Status: AC
  Administered 2017-06-28: 0.25 mg via INTRAVENOUS

## 2017-06-28 MED ORDER — DEXAMETHASONE SODIUM PHOSPHATE 10 MG/ML IJ SOLN
INTRAMUSCULAR | Status: AC
  Filled 2017-06-28: qty 1

## 2017-06-28 MED ORDER — PALONOSETRON HCL INJECTION 0.25 MG/5ML
INTRAVENOUS | Status: AC
  Filled 2017-06-28: qty 5

## 2017-06-28 MED ORDER — FLUOROURACIL CHEMO INJECTION 5 GM/100ML
2400.0000 mg/m2 | INTRAVENOUS | Status: DC
  Administered 2017-06-28: 4600 mg via INTRAVENOUS
  Filled 2017-06-28: qty 92

## 2017-06-28 MED ORDER — DEXTROSE 5 % IV SOLN
Freq: Once | INTRAVENOUS | Status: AC
  Administered 2017-06-28: 14:00:00 via INTRAVENOUS

## 2017-06-28 NOTE — Progress Notes (Signed)
  Oncology Nurse Navigator Documentation  Navigator Location: [facility] (06/28/17 1423)   )                    Treatment Initiated Date: 06/28/17 (06/28/17 1423)   Treatment Phase: First Chemo Tx (06/28/17 1423) Barriers/Navigation Needs: No Questions;No Needs (06/28/17 1423)   Interventions: Psycho-social support (06/28/17 1423)  Met with patient and daughter in the infusion room to encourage patient at her first chemo tx. No questions or concerns voiced.          Acuity: Level 1 (06/28/17 1423)   Acuity Level 2: Ongoing guidance and education throughout treatment as needed (06/28/17 1423)     Time Spent with Patient: 15 (06/28/17 1423)

## 2017-06-28 NOTE — Progress Notes (Signed)
Per Dr. Benay Spice, ok to treat with Creat of 2.6.

## 2017-06-28 NOTE — Progress Notes (Signed)
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  Unfortunately there aren't any foundations offering copay assistance for her Dx.  I offered the [place], went over what it covers and gave her the income requirement.  Pt informed me she exceeds the income requirement so she will not qualify for the grant at this time.  I gave her my card to contact me with any questions or concerns she may have in the future.

## 2017-06-28 NOTE — Patient Instructions (Signed)
[facility] Discharge Instructions for Patients Receiving Chemotherapy  Today you received the following chemotherapy agents: Oxaliplatin, Leucovorin, Adrucil.  To help prevent nausea and vomiting after your treatment, we encourage you to take your nausea medication as prescribed.   If you develop nausea and vomiting that is not controlled by your nausea medication, call the clinic.   BELOW ARE SYMPTOMS THAT SHOULD BE REPORTED IMMEDIATELY:  *FEVER GREATER THAN 100.5 F  *CHILLS WITH OR WITHOUT FEVER  NAUSEA AND VOMITING THAT IS NOT CONTROLLED WITH YOUR NAUSEA MEDICATION  *UNUSUAL SHORTNESS OF BREATH  *UNUSUAL BRUISING OR BLEEDING  TENDERNESS IN MOUTH AND THROAT WITH OR WITHOUT PRESENCE OF ULCERS  *URINARY PROBLEMS  *BOWEL PROBLEMS  UNUSUAL RASH Items with * indicate a potential emergency and should be followed up as soon as possible.  Feel free to call the clinic should you have any questions or concerns. The clinic phone number is [phone].  Please show the [place] at check-in to the Emergency Department and triage nurse.  Oxaliplatin Injection What is this medicine? OXALIPLATIN (ox AL i PLA tin) is a chemotherapy drug. It targets fast dividing cells, like cancer cells, and causes these cells to die. This medicine is used to treat cancers of the colon and rectum, and many other cancers. This medicine may be used for other purposes; ask your health care provider or pharmacist if you have questions. COMMON BRAND NAME(S): Eloxatin What should I tell my health care provider before I take this medicine? They need to know if you have any of these conditions: -kidney disease -an unusual or allergic reaction to oxaliplatin, other chemotherapy, other medicines, foods, dyes, or preservatives -pregnant or trying to get pregnant -breast-feeding How should I use this medicine? This drug is given as an infusion into a vein. It is administered in a hospital  or clinic by a specially trained health care professional. Talk to your pediatrician regarding the use of this medicine in children. Special care may be needed. Overdosage: If you think you have taken too much of this medicine contact a poison control center or emergency room at once. NOTE: This medicine is only for you. Do not share this medicine with others. What if I miss a dose? It is important not to miss a dose. Call your doctor or health care professional if you are unable to keep an appointment. What may interact with this medicine? -medicines to increase blood counts like filgrastim, pegfilgrastim, sargramostim -probenecid -some antibiotics like amikacin, gentamicin, neomycin, polymyxin B, streptomycin, tobramycin -zalcitabine Talk to your doctor or health care professional before taking any of these medicines: -acetaminophen -aspirin -ibuprofen -ketoprofen -naproxen This list may not describe all possible interactions. Give your health care provider a list of all the medicines, herbs, non-prescription drugs, or dietary supplements you use. Also tell them if you smoke, drink alcohol, or use illegal drugs. Some items may interact with your medicine. What should I watch for while using this medicine? Your condition will be monitored carefully while you are receiving this medicine. You will need important blood work done while you are taking this medicine. This medicine can make you more sensitive to cold. Do not drink cold drinks or use ice. Cover exposed skin before coming in contact with cold temperatures or cold objects. When out in cold weather wear warm clothing and cover your mouth and nose to warm the air that goes into your lungs. Tell your doctor if you get sensitive to the cold. This  drug may make you feel generally unwell. This is not uncommon, as chemotherapy can affect healthy cells as well as cancer cells. Report any side effects. Continue your course of treatment even  though you feel ill unless your doctor tells you to stop. In some cases, you may be given additional medicines to help with side effects. Follow all directions for their use. Call your doctor or health care professional for advice if you get a fever, chills or sore throat, or other symptoms of a cold or flu. Do not treat yourself. This drug decreases your body's ability to fight infections. Try to avoid being around people who are sick. This medicine may increase your risk to bruise or bleed. Call your doctor or health care professional if you notice any unusual bleeding. Be careful brushing and flossing your teeth or using a toothpick because you may get an infection or bleed more easily. If you have any dental work done, tell your dentist you are receiving this medicine. Avoid taking products that contain aspirin, acetaminophen, ibuprofen, naproxen, or ketoprofen unless instructed by your doctor. These medicines may hide a fever. Do not become pregnant while taking this medicine. Women should inform their doctor if they wish to become pregnant or think they might be pregnant. There is a potential for serious side effects to an unborn child. Talk to your health care professional or pharmacist for more information. Do not breast-feed an infant while taking this medicine. Call your doctor or health care professional if you get diarrhea. Do not treat yourself. What side effects may I notice from receiving this medicine? Side effects that you should report to your doctor or health care professional as soon as possible: -allergic reactions like skin rash, itching or hives, swelling of the face, lips, or tongue -low blood counts - This drug may decrease the number of white blood cells, red blood cells and platelets. You may be at increased risk for infections and bleeding. -signs of infection - fever or chills, cough, sore throat, pain or difficulty passing urine -signs of decreased platelets or bleeding -  bruising, pinpoint red spots on the skin, black, tarry stools, nosebleeds -signs of decreased red blood cells - unusually weak or tired, fainting spells, lightheadedness -breathing problems -chest pain, pressure -cough -diarrhea -jaw tightness -mouth sores -nausea and vomiting -pain, swelling, redness or irritation at the injection site -pain, tingling, numbness in the hands or feet -problems with balance, talking, walking -redness, blistering, peeling or loosening of the skin, including inside the mouth -trouble passing urine or change in the amount of urine Side effects that usually do not require medical attention (report to your doctor or health care professional if they continue or are bothersome): -changes in vision -constipation -hair loss -loss of appetite -metallic taste in the mouth or changes in taste -stomach pain This list may not describe all possible side effects. Call your doctor for medical advice about side effects. You may report side effects to FDA at 1-800-FDA-1088. Where should I keep my medicine? This drug is given in a hospital or clinic and will not be stored at home. NOTE: This sheet is a summary. It may not cover all possible information. If you have questions about this medicine, talk to your doctor, pharmacist, or health care provider.  2018 Elsevier/Gold Standard (2008-02-21 17:22:47)  Leucovorin injection What is this medicine? LEUCOVORIN (loo koe VOR in) is used to prevent or treat the harmful effects of some medicines. This medicine is used to treat anemia  caused by a low amount of folic acid in the body. It is also used with 5-fluorouracil (5-FU) to treat colon cancer. This medicine may be used for other purposes; ask your health care provider or pharmacist if you have questions. What should I tell my health care provider before I take this medicine? They need to know if you have any of these conditions: -anemia from low levels of vitamin B-12 in  the blood -an unusual or allergic reaction to leucovorin, folic acid, other medicines, foods, dyes, or preservatives -pregnant or trying to get pregnant -breast-feeding How should I use this medicine? This medicine is for injection into a muscle or into a vein. It is given by a health care professional in a hospital or clinic setting. Talk to your pediatrician regarding the use of this medicine in children. Special care may be needed. Overdosage: If you think you have taken too much of this medicine contact a poison control center or emergency room at once. NOTE: This medicine is only for you. Do not share this medicine with others. What if I miss a dose? This does not apply. What may interact with this medicine? -capecitabine -fluorouracil -phenobarbital -phenytoin -primidone -trimethoprim-sulfamethoxazole This list may not describe all possible interactions. Give your health care provider a list of all the medicines, herbs, non-prescription drugs, or dietary supplements you use. Also tell them if you smoke, drink alcohol, or use illegal drugs. Some items may interact with your medicine. What should I watch for while using this medicine? Your condition will be monitored carefully while you are receiving this medicine. This medicine may increase the side effects of 5-fluorouracil, 5-FU. Tell your doctor or health care professional if you have diarrhea or mouth sores that do not get better or that get worse. What side effects may I notice from receiving this medicine? Side effects that you should report to your doctor or health care professional as soon as possible: -allergic reactions like skin rash, itching or hives, swelling of the face, lips, or tongue -breathing problems -fever, infection -mouth sores -unusual bleeding or bruising -unusually weak or tired Side effects that usually do not require medical attention (report to your doctor or health care professional if they continue or  are bothersome): -constipation or diarrhea -loss of appetite -nausea, vomiting This list may not describe all possible side effects. Call your doctor for medical advice about side effects. You may report side effects to FDA at 1-800-FDA-1088. Where should I keep my medicine? This drug is given in a hospital or clinic and will not be stored at home. NOTE: This sheet is a summary. It may not cover all possible information. If you have questions about this medicine, talk to your doctor, pharmacist, or health care provider.  2018 Elsevier/Gold Standard (2008-01-31 16:50:29)  Fluorouracil, 5-FU injection (Adrucil) What is this medicine? FLUOROURACIL, 5-FU (flure oh YOOR a sil) is a chemotherapy drug. It slows the growth of cancer cells. This medicine is used to treat many types of cancer like breast cancer, colon or rectal cancer, pancreatic cancer, and stomach cancer. This medicine may be used for other purposes; ask your health care provider or pharmacist if you have questions. COMMON BRAND NAME(S): Adrucil What should I tell my health care provider before I take this medicine? They need to know if you have any of these conditions: -blood disorders -dihydropyrimidine dehydrogenase (DPD) deficiency -infection (especially a virus infection such as chickenpox, cold sores, or herpes) -kidney disease -liver disease -malnourished, poor nutrition -recent or  ongoing radiation therapy -an unusual or allergic reaction to fluorouracil, other chemotherapy, other medicines, foods, dyes, or preservatives -pregnant or trying to get pregnant -breast-feeding How should I use this medicine? This drug is given as an infusion or injection into a vein. It is administered in a hospital or clinic by a specially trained health care professional. Talk to your pediatrician regarding the use of this medicine in children. Special care may be needed. Overdosage: If you think you have taken too much of this medicine  contact a poison control center or emergency room at once. NOTE: This medicine is only for you. Do not share this medicine with others. What if I miss a dose? It is important not to miss your dose. Call your doctor or health care professional if you are unable to keep an appointment. What may interact with this medicine? -allopurinol -cimetidine -dapsone -digoxin -hydroxyurea -leucovorin -levamisole -medicines for seizures like ethotoin, fosphenytoin, phenytoin -medicines to increase blood counts like filgrastim, pegfilgrastim, sargramostim -medicines that treat or prevent blood clots like warfarin, enoxaparin, and dalteparin -methotrexate -metronidazole -pyrimethamine -some other chemotherapy drugs like busulfan, cisplatin, estramustine, vinblastine -trimethoprim -trimetrexate -vaccines Talk to your doctor or health care professional before taking any of these medicines: -acetaminophen -aspirin -ibuprofen -ketoprofen -naproxen This list may not describe all possible interactions. Give your health care provider a list of all the medicines, herbs, non-prescription drugs, or dietary supplements you use. Also tell them if you smoke, drink alcohol, or use illegal drugs. Some items may interact with your medicine. What should I watch for while using this medicine? Visit your doctor for checks on your progress. This drug may make you feel generally unwell. This is not uncommon, as chemotherapy can affect healthy cells as well as cancer cells. Report any side effects. Continue your course of treatment even though you feel ill unless your doctor tells you to stop. In some cases, you may be given additional medicines to help with side effects. Follow all directions for their use. Call your doctor or health care professional for advice if you get a fever, chills or sore throat, or other symptoms of a cold or flu. Do not treat yourself. This drug decreases your body's ability to fight  infections. Try to avoid being around people who are sick. This medicine may increase your risk to bruise or bleed. Call your doctor or health care professional if you notice any unusual bleeding. Be careful brushing and flossing your teeth or using a toothpick because you may get an infection or bleed more easily. If you have any dental work done, tell your dentist you are receiving this medicine. Avoid taking products that contain aspirin, acetaminophen, ibuprofen, naproxen, or ketoprofen unless instructed by your doctor. These medicines may hide a fever. Do not become pregnant while taking this medicine. Women should inform their doctor if they wish to become pregnant or think they might be pregnant. There is a potential for serious side effects to an unborn child. Talk to your health care professional or pharmacist for more information. Do not breast-feed an infant while taking this medicine. Men should inform their doctor if they wish to father a child. This medicine may lower sperm counts. Do not treat diarrhea with over the counter products. Contact your doctor if you have diarrhea that lasts more than 2 days or if it is severe and watery. This medicine can make you more sensitive to the sun. Keep out of the sun. If you cannot avoid being in the sun,  wear protective clothing and use sunscreen. Do not use sun lamps or tanning beds/booths. What side effects may I notice from receiving this medicine? Side effects that you should report to your doctor or health care professional as soon as possible: -allergic reactions like skin rash, itching or hives, swelling of the face, lips, or tongue -low blood counts - this medicine may decrease the number of white blood cells, red blood cells and platelets. You may be at increased risk for infections and bleeding. -signs of infection - fever or chills, cough, sore throat, pain or difficulty passing urine -signs of decreased platelets or bleeding - bruising,  pinpoint red spots on the skin, black, tarry stools, blood in the urine -signs of decreased red blood cells - unusually weak or tired, fainting spells, lightheadedness -breathing problems -changes in vision -chest pain -mouth sores -nausea and vomiting -pain, swelling, redness at site where injected -pain, tingling, numbness in the hands or feet -redness, swelling, or sores on hands or feet -stomach pain -unusual bleeding Side effects that usually do not require medical attention (report to your doctor or health care professional if they continue or are bothersome): -changes in finger or toe nails -diarrhea -dry or itchy skin -hair loss -headache -loss of appetite -sensitivity of eyes to the light -stomach upset -unusually teary eyes This list may not describe all possible side effects. Call your doctor for medical advice about side effects. You may report side effects to FDA at 1-800-FDA-1088. Where should I keep my medicine? This drug is given in a hospital or clinic and will not be stored at home. NOTE: This sheet is a summary. It may not cover all possible information. If you have questions about this medicine, talk to your doctor, pharmacist, or health care provider.  2018 Elsevier/Gold Standard (2007-11-30 13:53:16)

## 2017-06-28 NOTE — Progress Notes (Addendum)
  [facility] OFFICE PROGRESS NOTE   Diagnosis: Unknown primary carcinoma  INTERVAL HISTORY:   Ms. Bridgett returns as scheduled.  She reports onset of left hip and leg pain last week.  She takes Tylenol with minimal relief.  The pain is relieved with standing, worse with sitting.  No numbness or tingling. No incontinence.  She continues to have diarrhea.  No bleeding.  Objective:  Vital signs in last 24 hours:  Blood pressure 108/72, pulse (!) 104, temperature 98.6 F (37 C), temperature source Oral, resp. rate 18, height 5\' 3"  (1.6 m), weight 177 lb 8 oz (80.5 kg), SpO2 100 %.    HEENT: No thrush or ulcers. Resp: Lungs clear bilaterally. Cardio: Regular rate and rhythm. GI: Abdomen soft and nontender.  No hepatomegaly. Vascular: No leg edema. Neuro: Motor strength 5/5.  DTRs 2+, symmetric. Port-A-Cath without erythema.  Lab Results:  Lab Results  Component Value Date   WBC 8.4 06/28/2017   HGB 8.7 (L) 06/28/2017   HCT 27.4 (L) 06/28/2017   MCV 83.8 06/28/2017   PLT 356 06/28/2017   NEUTROABS 6.3 06/28/2017    Imaging:  No results found.  Medications: I have reviewed the patient's current medications.  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  Findings consistent with a gastrointestinal or pancreatico/biliary primary.  Cycle 1 FOLFOX 06/28/2017  2. 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 GI bleeding after TPA 05/24/2017-resolved  4. 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 Small PFO  6. Cervical cancer at age 50  7.  Papillary renal cell carcinoma 2006, status post left nephrectomy  8.  Renal insufficiency.  Followed by Dr. Moshe Cipro.  9.  Port-A-Cath placement 06/23/2017  10.  Left hip/leg pain.  Likely related to #1.  She will try hydrocodone.   Disposition: Ms. Hund appears unchanged.  The plan is to proceed with cycle 1 FOLFOX today as scheduled.  We again reviewed potential toxicities.  She agrees to proceed.    She is having left hip/leg pain.  This may be related to carcinomatosis.  She will try hydrocodone.  She will return for a follow-up visit in 2 weeks.  She will contact the office in the interim with any problems.  We specifically discussed poor pain control.  Patient seen with Dr. Benay Spice.  25 minutes were spent face-to-face at today's visit with the majority of that time involved in counseling/coordination of care.    Ned Card ANP/GNP-BC   06/28/2017  12:16 PM This was a shared visit with Ned Card.  Ms. Debo was interviewed and examined.  The left lower back and hip discomfort may be related to metastatic disease in the left pelvis.  We prescribed hydrocodone.  She will begin FOLFOX chemotherapy today.  Julieanne Manson, MD

## 2017-06-29 ENCOUNTER — Telehealth: Payer: Self-pay | Admitting: *Deleted

## 2017-06-29 NOTE — Telephone Encounter (Signed)
Called pt, FOLFOX side effects and management reviewed. She reports mild constipation. Instructed pt to push PO fluids. Drink at least 64oz per day. Narcotic teaching reinforced. Pt reports she forgot ambulatory pump and it fell. Denies any leaking of tubing or bleeding at port site. Pt reports port continues to run. Reviewed office contact info. Encouraged pt to call as issues arise. Confirmed 3PM pump DC appt.

## 2017-06-29 NOTE — Telephone Encounter (Signed)
-----   Message from [name], RN sent at 06/28/2017  5:25 PM EST ----- Regarding: 1st Chemo F/U: Shawna Schneider Patient received first dose of oxaliplatin, leucovorin and adrucil. She tolerated treatment with no complications. Please call patient to follow up.

## 2017-06-30 ENCOUNTER — Ambulatory Visit (HOSPITAL_BASED_OUTPATIENT_CLINIC_OR_DEPARTMENT_OTHER): Payer: BC Managed Care – PPO

## 2017-06-30 VITALS — BP 118/70 | HR 94 | Temp 98.0°F | Resp 18

## 2017-06-30 DIAGNOSIS — C801 Malignant (primary) neoplasm, unspecified: Secondary | ICD-10-CM

## 2017-06-30 DIAGNOSIS — Z452 Encounter for adjustment and management of vascular access device: Secondary | ICD-10-CM

## 2017-06-30 DIAGNOSIS — C800 Disseminated malignant neoplasm, unspecified: Secondary | ICD-10-CM

## 2017-06-30 DIAGNOSIS — C786 Secondary malignant neoplasm of retroperitoneum and peritoneum: Secondary | ICD-10-CM | POA: Diagnosis not present

## 2017-06-30 MED ORDER — SODIUM CHLORIDE 0.9% FLUSH
10.0000 mL | INTRAVENOUS | Status: DC | PRN
  Administered 2017-06-30: 10 mL
  Filled 2017-06-30: qty 10

## 2017-06-30 MED ORDER — HEPARIN SOD (PORK) LOCK FLUSH 100 UNIT/ML IV SOLN
500.0000 [IU] | Freq: Once | INTRAVENOUS | Status: AC | PRN
  Administered 2017-06-30: 500 [IU]
  Filled 2017-06-30: qty 5

## 2017-07-11 ENCOUNTER — Other Ambulatory Visit: Payer: Self-pay | Admitting: Oncology

## 2017-07-12 ENCOUNTER — Ambulatory Visit: Payer: BC Managed Care – PPO

## 2017-07-12 ENCOUNTER — Ambulatory Visit (HOSPITAL_BASED_OUTPATIENT_CLINIC_OR_DEPARTMENT_OTHER): Payer: BC Managed Care – PPO | Admitting: Nurse Practitioner

## 2017-07-12 ENCOUNTER — Other Ambulatory Visit (HOSPITAL_BASED_OUTPATIENT_CLINIC_OR_DEPARTMENT_OTHER): Payer: BC Managed Care – PPO

## 2017-07-12 ENCOUNTER — Ambulatory Visit (HOSPITAL_COMMUNITY)
Admission: RE | Admit: 2017-07-12 | Discharge: 2017-07-12 | Disposition: A | Discharge status: Home / Self Care | Payer: BC Managed Care – PPO | Source: Ambulatory Visit | Attending: Nurse Practitioner | Admitting: Nurse Practitioner

## 2017-07-12 ENCOUNTER — Ambulatory Visit: Payer: BC Managed Care – PPO | Admitting: Nutrition

## 2017-07-12 ENCOUNTER — Encounter: Payer: Self-pay | Admitting: Nurse Practitioner

## 2017-07-12 ENCOUNTER — Ambulatory Visit (HOSPITAL_BASED_OUTPATIENT_CLINIC_OR_DEPARTMENT_OTHER): Payer: BC Managed Care – PPO

## 2017-07-12 VITALS — BP 109/79 | HR 96 | Temp 98.1°F | Resp 18 | Ht 63.0 in | Wt 176.2 lb

## 2017-07-12 DIAGNOSIS — C189 Malignant neoplasm of colon, unspecified: Secondary | ICD-10-CM

## 2017-07-12 DIAGNOSIS — I82403 Acute embolism and thrombosis of unspecified deep veins of lower extremity, bilateral: Secondary | ICD-10-CM

## 2017-07-12 DIAGNOSIS — D649 Anemia, unspecified: Secondary | ICD-10-CM

## 2017-07-12 DIAGNOSIS — C799 Secondary malignant neoplasm of unspecified site: Principal | ICD-10-CM

## 2017-07-12 DIAGNOSIS — C786 Secondary malignant neoplasm of retroperitoneum and peritoneum: Secondary | ICD-10-CM

## 2017-07-12 DIAGNOSIS — C186 Malignant neoplasm of descending colon: Secondary | ICD-10-CM | POA: Insufficient documentation

## 2017-07-12 DIAGNOSIS — N289 Disorder of kidney and ureter, unspecified: Secondary | ICD-10-CM

## 2017-07-12 DIAGNOSIS — C801 Malignant (primary) neoplasm, unspecified: Secondary | ICD-10-CM

## 2017-07-12 DIAGNOSIS — C800 Disseminated malignant neoplasm, unspecified: Secondary | ICD-10-CM

## 2017-07-12 LAB — CBC WITH DIFFERENTIAL/PLATELET
BASO%: 0.2 % (ref 0.0–2.0)
Basophils Absolute: 0 10*3/uL (ref 0.0–0.1)
EOS%: 0.9 % (ref 0.0–7.0)
Eosinophils Absolute: 0 10*3/uL (ref 0.0–0.5)
HCT: 22.9 % — ABNORMAL LOW (ref 34.8–46.6)
HGB: 7.3 g/dL — ABNORMAL LOW (ref 11.6–15.9)
LYMPH#: 0.9 10*3/uL (ref 0.9–3.3)
LYMPH%: 19.8 % (ref 14.0–49.7)
MCH: 26.6 pg (ref 25.1–34.0)
MCHC: 31.9 g/dL (ref 31.5–36.0)
MCV: 83.6 fL (ref 79.5–101.0)
MONO#: 0.7 10*3/uL (ref 0.1–0.9)
MONO%: 15 % — AB (ref 0.0–14.0)
NEUT#: 2.9 10*3/uL (ref 1.5–6.5)
NEUT%: 64.1 % (ref 38.4–76.8)
Platelets: 311 10*3/uL (ref 145–400)
RBC: 2.74 10*6/uL — AB (ref 3.70–5.45)
RDW: 18.8 % — ABNORMAL HIGH (ref 11.2–14.5)
WBC: 4.5 10*3/uL (ref 3.9–10.3)

## 2017-07-12 LAB — COMPREHENSIVE METABOLIC PANEL
ALBUMIN: 2.6 g/dL — AB (ref 3.5–5.0)
ALK PHOS: 163 U/L — AB (ref 40–150)
ALT: 14 U/L (ref 0–55)
AST: 19 U/L (ref 5–34)
Anion Gap: 9 mEq/L (ref 3–11)
BILIRUBIN TOTAL: 0.38 mg/dL (ref 0.20–1.20)
BUN: 47.4 mg/dL — AB (ref 7.0–26.0)
CO2: 19 mEq/L — ABNORMAL LOW (ref 22–29)
Calcium: 10.1 mg/dL (ref 8.4–10.4)
Chloride: 109 mEq/L (ref 98–109)
Creatinine: 3.8 mg/dL (ref 0.6–1.1)
EGFR: 12 mL/min/{1.73_m2} — AB (ref >60)
GLUCOSE: 84 mg/dL (ref 70–140)
Potassium: 3.9 mEq/L (ref 3.5–5.1)
SODIUM: 137 meq/L (ref 136–145)
TOTAL PROTEIN: 6.7 g/dL (ref 6.4–8.3)

## 2017-07-12 LAB — PREPARE RBC (CROSSMATCH)

## 2017-07-12 MED ORDER — HEPARIN SOD (PORK) LOCK FLUSH 100 UNIT/ML IV SOLN
500.0000 [IU] | Freq: Every day | INTRAVENOUS | Status: AC | PRN
  Administered 2017-07-12: 500 [IU]
  Filled 2017-07-12: qty 5

## 2017-07-12 MED ORDER — HEPARIN SOD (PORK) LOCK FLUSH 100 UNIT/ML IV SOLN
250.0000 [IU] | INTRAVENOUS | Status: DC | PRN
  Filled 2017-07-12: qty 5

## 2017-07-12 MED ORDER — SODIUM CHLORIDE 0.9% FLUSH
3.0000 mL | INTRAVENOUS | Status: DC | PRN
  Filled 2017-07-12: qty 10

## 2017-07-12 MED ORDER — SODIUM CHLORIDE 0.9% FLUSH
10.0000 mL | INTRAVENOUS | Status: AC | PRN
  Administered 2017-07-12: 10 mL
  Filled 2017-07-12: qty 10

## 2017-07-12 MED ORDER — SODIUM CHLORIDE 0.9% FLUSH
10.0000 mL | Freq: Once | INTRAVENOUS | Status: AC
  Administered 2017-07-12: 10 mL
  Filled 2017-07-12: qty 10

## 2017-07-12 MED ORDER — SODIUM CHLORIDE 0.9 % IV SOLN: 250.0000 mL | Freq: Once | INTRAVENOUS | Status: DC

## 2017-07-12 MED ORDER — SODIUM CHLORIDE 0.9 % IV SOLN
INTRAVENOUS | Status: DC
  Administered 2017-07-12: 15:00:00 via INTRAVENOUS

## 2017-07-12 NOTE — Progress Notes (Signed)
  Oncology Nurse Navigator Documentation  Navigator Location: [facility] (07/12/17 1524)   )Navigator Encounter Type: Treatment (07/12/17 1524)                     Patient Visit Type: MedOnc (07/12/17 1524) Treatment Phase: Treatment (07/12/17 1524)     Interventions: Psycho-social support (07/12/17 1524)  Met with patient in the infusion room. Patient not getting treatment today due to elevated creatinine. Encouragement and support provided. Patient states that she is OK with treatment delay as long as "I am not in pain." Patient's daughter was with patient and shared that she was encouraged by information provided by the dietician. Patient and mother verbalized understanding that they can call me with questions or concerns.          Acuity: Level 2 (07/12/17 1524)   Acuity Level 2: Ongoing guidance and education throughout treatment as needed (07/12/17 1524)     Time Spent with Patient: 15 (07/12/17 1524)

## 2017-07-12 NOTE — Progress Notes (Signed)
  [facility] OFFICE PROGRESS NOTE   Diagnosis: Unknown primary carcinoma  INTERVAL HISTORY:   Ms. Losey returns as scheduled.  She completed cycle 1 FOLFOX 06/28/2017.  She denies nausea/vomiting.  No mouth sores.  No diarrhea.  No issues with cold sensitivity.  Left hip/leg pain has resolved.  She denies any bleeding.  She reports poor fluid intake.  Objective:  Vital signs in last 24 hours:  Blood pressure 109/79, pulse 96, temperature 98.1 F (36.7 C), temperature source Oral, resp. rate 18, height 5\' 3"  (1.6 m), weight 176 lb 3.2 oz (79.9 kg), SpO2 100 %.    HEENT: No thrush or ulcers. Resp: Lungs clear bilaterally. Cardio: Regular rate and rhythm. GI: Abdomen soft and nontender.  No hepatomegaly. Vascular: No leg edema. Port-A-Cath without erythema.  Lab Results:  Lab Results  Component Value Date   WBC 4.5 07/12/2017   HGB 7.3 (L) 07/12/2017   HCT 22.9 (L) 07/12/2017   MCV 83.6 07/12/2017   PLT 311 07/12/2017   NEUTROABS 2.9 07/12/2017    Imaging:  No results found.  Medications: I have reviewed the patient's current medications.  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  Findings consistent with a gastrointestinal or pancreatico/biliary primary.  Cycle 1 FOLFOX 06/28/2017  2. 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 GI bleeding after TPA 05/24/2017-resolved  4. 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 Small PFO  6. Cervical cancer at age 70  7. Papillary renal cell carcinoma 2006, status post left  nephrectomy  8.Renal insufficiency.  Followed by Dr. Moshe Cipro.  9.  Port-A-Cath placement 06/23/2017  10.  Left hip/leg pain.  Likely related to #1.  Resolved 07/12/2017.   Disposition: Ms. Grahn appears stable.  She has completed 1 cycle of FOLFOX.  We are holding cycle 2 today due to progressive renal dysfunction which may be due to dehydration.  She will receive a liter of IV fluids and we will reschedule the chemotherapy until 07/19/2017.  She will return for a follow-up chemistry panel 07/15/2017.  She will work on increasing fluid intake.  She has progressive anemia.  There is no clinical evidence of bleeding.  She will be transfused 2 units of blood 07/13/2017.  We will see her in follow-up prior to cycle 2 FOLFOX in 1 week.  She will contact the office in the interim with any problems.  Plan reviewed with Dr. Benay Spice.  25 minutes were spent face-to-face at today's visit with the majority of that time involved in counseling/coordination of care.    Ned Card ANP/GNP-BC   07/12/2017  1:41 PM

## 2017-07-12 NOTE — Patient Instructions (Signed)

## 2017-07-12 NOTE — Patient Instructions (Signed)

## 2017-07-12 NOTE — Progress Notes (Signed)
82 year old female diagnosed with an unknown primary cancer.  She is a patient of Dr. Julieanne Manson.  Past medical history includes solitary kidney, and hyperlipidemia.  Medications include Synthroid, prednisone, Compazine, Crestor.  Labs include BUN 47.4, creatinine 3.8, albumin 2.6 on December 3.  Height: 63 inches. Weight: 176.2 pounds. Patient weight 189 pounds November 14. BMI: 31.21.  Patient reports early satiety. She struggles with drinking adequate fluids. Patient reports she sometimes has nausea if she eats eggs first thing in the morning. Denies constipation or diarrhea.  Nutrition diagnosis:  Inadequate oral intake related to new diagnosis of cancer and associated treatments as evidenced by 13 pound weight loss over 2 weeks.  Intervention: Patient was educated to consume small frequent meals and snacks that are high in calories and protein. Encouraged weight maintenance. Recommended patient drink at least 64 ounces of water Educated patient on strategies for improving nausea. Provided fact sheets.  Questions were answered.  Teach back method was used.  Contact information given.  Monitoring, evaluation, goals: Patient will tolerate adequate calories and protein to maintain current weight and increase fluid intake.    Next visit: To be scheduled with treatments as needed.  **Disclaimer: This note was dictated with voice recognition software. Similar sounding words can inadvertently be transcribed and this note may contain transcription errors which may not have been corrected upon publication of note.**

## 2017-07-13 ENCOUNTER — Other Ambulatory Visit: Payer: Self-pay | Admitting: Medical Oncology

## 2017-07-13 ENCOUNTER — Ambulatory Visit (HOSPITAL_BASED_OUTPATIENT_CLINIC_OR_DEPARTMENT_OTHER): Payer: BC Managed Care – PPO

## 2017-07-13 ENCOUNTER — Telehealth: Payer: Self-pay | Admitting: Oncology

## 2017-07-13 VITALS — BP 114/72 | HR 80 | Temp 98.1°F | Resp 18

## 2017-07-13 DIAGNOSIS — C186 Malignant neoplasm of descending colon: Secondary | ICD-10-CM | POA: Diagnosis not present

## 2017-07-13 DIAGNOSIS — D649 Anemia, unspecified: Secondary | ICD-10-CM

## 2017-07-13 LAB — PREPARE RBC (CROSSMATCH)

## 2017-07-13 LAB — ABO/RH: ABO/RH(D): O POS

## 2017-07-13 MED ORDER — SODIUM CHLORIDE 0.9% FLUSH
10.0000 mL | INTRAVENOUS | Status: AC | PRN
  Administered 2017-07-13: 10 mL
  Filled 2017-07-13: qty 10

## 2017-07-13 MED ORDER — SODIUM CHLORIDE 0.9 % IV SOLN: 250.0000 mL | Freq: Once | INTRAVENOUS | Status: DC

## 2017-07-13 MED ORDER — HEPARIN SOD (PORK) LOCK FLUSH 100 UNIT/ML IV SOLN
500.0000 [IU] | Freq: Every day | INTRAVENOUS | Status: AC | PRN
  Administered 2017-07-13: 500 [IU]
  Filled 2017-07-13: qty 5

## 2017-07-13 MED ORDER — SODIUM CHLORIDE 0.9% FLUSH
10.0000 mL | INTRAVENOUS | Status: DC | PRN
  Filled 2017-07-13: qty 10

## 2017-07-13 MED ORDER — HEPARIN SOD (PORK) LOCK FLUSH 100 UNIT/ML IV SOLN
500.0000 [IU] | Freq: Every day | INTRAVENOUS | Status: DC | PRN
  Filled 2017-07-13: qty 5

## 2017-07-13 MED ORDER — SODIUM CHLORIDE 0.9 % IV SOLN
250.0000 mL | Freq: Once | INTRAVENOUS | Status: AC
  Administered 2017-07-13: 250 mL via INTRAVENOUS

## 2017-07-13 NOTE — Telephone Encounter (Signed)
Spoke with patient regarding appt added per 12/3 sch msg.

## 2017-07-13 NOTE — Patient Instructions (Signed)

## 2017-07-14 LAB — TYPE AND SCREEN
ABO/RH(D): O POS
Antibody Screen: NEGATIVE
UNIT DIVISION: 0
UNIT DIVISION: 0

## 2017-07-14 LAB — BPAM RBC
Blood Product Expiration Date: 201812252359
Blood Product Expiration Date: 201812252359
ISSUE DATE / TIME: 201812031616
ISSUE DATE / TIME: 201812040757
Unit Type and Rh: 5100
Unit Type and Rh: 5100

## 2017-07-15 ENCOUNTER — Encounter: Payer: Self-pay | Admitting: Oncology

## 2017-07-15 NOTE — Telephone Encounter (Signed)
Called pt in response to MyChart message. She reports one dark stool around 3PM today. Instructed her to monitor overnight. If she has several, black tarry stools will need to go to ED. Otherwise, will review with MD when back in office on 12/7. Pt voiced understanding.

## 2017-07-16 ENCOUNTER — Telehealth: Payer: Self-pay | Admitting: *Deleted

## 2017-07-16 NOTE — Telephone Encounter (Signed)
Called pt to follow up report of black stool on 12/6. She denies any black stools today. Instructed her to be seen in ED over the weekend if it reoccurs, per Dr. Benay Spice. She voiced understanding.  Pt asking about schedule in case of inclement weather. Instructed her to call office if unable to make it in safely on 12/10. She voiced understanding.

## 2017-07-17 ENCOUNTER — Other Ambulatory Visit: Payer: Self-pay

## 2017-07-17 ENCOUNTER — Encounter (HOSPITAL_COMMUNITY): Payer: Self-pay | Admitting: Emergency Medicine

## 2017-07-17 ENCOUNTER — Inpatient Hospital Stay (HOSPITAL_COMMUNITY)
Admission: EM | Admit: 2017-07-17 | Discharge: 2017-07-19 | DRG: 813 | Disposition: A | Discharge status: Home / Self Care | Payer: BC Managed Care – PPO | Attending: Nephrology | Admitting: Nephrology

## 2017-07-17 ENCOUNTER — Observation Stay (HOSPITAL_BASED_OUTPATIENT_CLINIC_OR_DEPARTMENT_OTHER): Payer: BC Managed Care – PPO

## 2017-07-17 ENCOUNTER — Other Ambulatory Visit (HOSPITAL_COMMUNITY): Payer: Self-pay

## 2017-07-17 DIAGNOSIS — I129 Hypertensive chronic kidney disease with stage 1 through stage 4 chronic kidney disease, or unspecified chronic kidney disease: Secondary | ICD-10-CM | POA: Diagnosis present

## 2017-07-17 DIAGNOSIS — C786 Secondary malignant neoplasm of retroperitoneum and peritoneum: Secondary | ICD-10-CM | POA: Diagnosis present

## 2017-07-17 DIAGNOSIS — D638 Anemia in other chronic diseases classified elsewhere: Secondary | ICD-10-CM | POA: Diagnosis present

## 2017-07-17 DIAGNOSIS — Z86718 Personal history of other venous thrombosis and embolism: Secondary | ICD-10-CM | POA: Diagnosis not present

## 2017-07-17 DIAGNOSIS — C799 Secondary malignant neoplasm of unspecified site: Secondary | ICD-10-CM | POA: Diagnosis present

## 2017-07-17 DIAGNOSIS — K921 Melena: Secondary | ICD-10-CM | POA: Diagnosis not present

## 2017-07-17 DIAGNOSIS — K922 Gastrointestinal hemorrhage, unspecified: Secondary | ICD-10-CM | POA: Diagnosis present

## 2017-07-17 DIAGNOSIS — C7800 Secondary malignant neoplasm of unspecified lung: Secondary | ICD-10-CM | POA: Diagnosis present

## 2017-07-17 DIAGNOSIS — D68318 Other hemorrhagic disorder due to intrinsic circulating anticoagulants, antibodies, or inhibitors: Secondary | ICD-10-CM | POA: Diagnosis not present

## 2017-07-17 DIAGNOSIS — C189 Malignant neoplasm of colon, unspecified: Secondary | ICD-10-CM | POA: Diagnosis present

## 2017-07-17 DIAGNOSIS — Z880 Allergy status to penicillin: Secondary | ICD-10-CM

## 2017-07-17 DIAGNOSIS — N183 Chronic kidney disease, stage 3 (moderate): Secondary | ICD-10-CM | POA: Diagnosis present

## 2017-07-17 DIAGNOSIS — Z7901 Long term (current) use of anticoagulants: Secondary | ICD-10-CM | POA: Diagnosis not present

## 2017-07-17 DIAGNOSIS — I824Z3 Acute embolism and thrombosis of unspecified deep veins of distal lower extremity, bilateral: Secondary | ICD-10-CM | POA: Diagnosis not present

## 2017-07-17 DIAGNOSIS — E785 Hyperlipidemia, unspecified: Secondary | ICD-10-CM | POA: Diagnosis present

## 2017-07-17 DIAGNOSIS — Z79899 Other long term (current) drug therapy: Secondary | ICD-10-CM

## 2017-07-17 DIAGNOSIS — D62 Acute posthemorrhagic anemia: Secondary | ICD-10-CM | POA: Diagnosis present

## 2017-07-17 DIAGNOSIS — Z85528 Personal history of other malignant neoplasm of kidney: Secondary | ICD-10-CM

## 2017-07-17 DIAGNOSIS — Z905 Acquired absence of kidney: Secondary | ICD-10-CM

## 2017-07-17 LAB — CBC WITH DIFFERENTIAL/PLATELET
BASOS ABS: 0 10*3/uL (ref 0.0–0.1)
BASOS PCT: 0 %
EOS PCT: 1 %
Eosinophils Absolute: 0.1 10*3/uL (ref 0.0–0.7)
HCT: 31 % — ABNORMAL LOW (ref 36.0–46.0)
Hemoglobin: 9.9 g/dL — ABNORMAL LOW (ref 12.0–15.0)
Lymphocytes Relative: 23 %
Lymphs Abs: 0.9 10*3/uL (ref 0.7–4.0)
MCH: 27.1 pg (ref 26.0–34.0)
MCHC: 31.9 g/dL (ref 30.0–36.0)
MCV: 84.9 fL (ref 78.0–100.0)
MONO ABS: 1.3 10*3/uL — AB (ref 0.1–1.0)
Monocytes Relative: 35 %
Neutro Abs: 1.5 10*3/uL — ABNORMAL LOW (ref 1.7–7.7)
Neutrophils Relative %: 41 %
PLATELETS: 427 10*3/uL — AB (ref 150–400)
RBC: 3.65 MIL/uL — ABNORMAL LOW (ref 3.87–5.11)
RDW: 17.7 % — AB (ref 11.5–15.5)
WBC: 3.7 10*3/uL — ABNORMAL LOW (ref 4.0–10.5)

## 2017-07-17 LAB — TYPE AND SCREEN
ABO/RH(D): O POS
ANTIBODY SCREEN: NEGATIVE

## 2017-07-17 LAB — COMPREHENSIVE METABOLIC PANEL
ALBUMIN: 2.7 g/dL — AB (ref 3.5–5.0)
ALT: 10 U/L — ABNORMAL LOW (ref 14–54)
ANION GAP: 9 (ref 5–15)
AST: 16 U/L (ref 15–41)
Alkaline Phosphatase: 128 U/L — ABNORMAL HIGH (ref 38–126)
BUN: 29 mg/dL — AB (ref 6–20)
CHLORIDE: 107 mmol/L (ref 101–111)
CO2: 20 mmol/L — ABNORMAL LOW (ref 22–32)
Calcium: 9.8 mg/dL (ref 8.9–10.3)
Creatinine, Ser: 2.28 mg/dL — ABNORMAL HIGH (ref 0.44–1.00)
GFR calc Af Amer: 22 mL/min — ABNORMAL LOW (ref >60)
GFR, EST NON AFRICAN AMERICAN: 19 mL/min — AB (ref >60)
Glucose, Bld: 92 mg/dL (ref 65–99)
POTASSIUM: 4.1 mmol/L (ref 3.5–5.1)
Sodium: 136 mmol/L (ref 135–145)
Total Bilirubin: 0.4 mg/dL (ref 0.3–1.2)
Total Protein: 6.5 g/dL (ref 6.5–8.1)

## 2017-07-17 LAB — POC OCCULT BLOOD, ED: Fecal Occult Bld: POSITIVE — AB

## 2017-07-17 LAB — PROTIME-INR
INR: 1.37
PROTHROMBIN TIME: 16.8 s — AB (ref 11.4–15.2)

## 2017-07-17 MED ORDER — ACETAMINOPHEN 325 MG PO TABS: 650.0000 mg | ORAL_TABLET | Freq: Four times a day (QID) | ORAL | Status: DC | PRN

## 2017-07-17 MED ORDER — ALLOPURINOL 100 MG PO TABS
100.0000 mg | ORAL_TABLET | Freq: Every day | ORAL | Status: DC
  Administered 2017-07-17 – 2017-07-19 (×3): 100 mg via ORAL
  Filled 2017-07-17 (×3): qty 1

## 2017-07-17 MED ORDER — DILTIAZEM HCL ER COATED BEADS 180 MG PO CP24
180.0000 mg | ORAL_CAPSULE | Freq: Every day | ORAL | Status: DC
  Administered 2017-07-17 – 2017-07-19 (×2): 180 mg via ORAL
  Filled 2017-07-17 (×3): qty 1

## 2017-07-17 MED ORDER — VALSARTAN-HYDROCHLOROTHIAZIDE 320-12.5 MG PO TABS: 1.0000 | ORAL_TABLET | Freq: Every day | ORAL | Status: DC

## 2017-07-17 MED ORDER — PANTOPRAZOLE SODIUM 40 MG PO TBEC
40.0000 mg | DELAYED_RELEASE_TABLET | Freq: Every day | ORAL | Status: DC
  Administered 2017-07-18: 40 mg via ORAL
  Filled 2017-07-17 (×2): qty 1

## 2017-07-17 MED ORDER — ROSUVASTATIN CALCIUM 5 MG PO TABS
5.0000 mg | ORAL_TABLET | Freq: Every day | ORAL | Status: DC
  Administered 2017-07-17 – 2017-07-18 (×2): 5 mg via ORAL
  Filled 2017-07-17 (×2): qty 1

## 2017-07-17 MED ORDER — ACETAMINOPHEN 650 MG RE SUPP: 650.0000 mg | Freq: Four times a day (QID) | RECTAL | Status: DC | PRN

## 2017-07-17 MED ORDER — IRBESARTAN 300 MG PO TABS
300.0000 mg | ORAL_TABLET | Freq: Every day | ORAL | Status: DC
  Administered 2017-07-18: 300 mg via ORAL
  Filled 2017-07-17 (×2): qty 1

## 2017-07-17 MED ORDER — LEVOTHYROXINE SODIUM 75 MCG PO TABS
75.0000 ug | ORAL_TABLET | Freq: Every day | ORAL | Status: DC
  Administered 2017-07-18 – 2017-07-19 (×2): 75 ug via ORAL
  Filled 2017-07-17: qty 3
  Filled 2017-07-17 (×2): qty 1
  Filled 2017-07-17: qty 3

## 2017-07-17 MED ORDER — HYDROCHLOROTHIAZIDE 12.5 MG PO CAPS
12.5000 mg | ORAL_CAPSULE | Freq: Every day | ORAL | Status: DC
  Administered 2017-07-17 – 2017-07-18 (×2): 12.5 mg via ORAL
  Filled 2017-07-17 (×2): qty 1

## 2017-07-17 MED ORDER — SODIUM CHLORIDE 0.9 % IV SOLN
80.0000 mg | Freq: Once | INTRAVENOUS | Status: AC
  Administered 2017-07-17: 80 mg via INTRAVENOUS
  Filled 2017-07-17: qty 80

## 2017-07-17 MED ORDER — PANTOPRAZOLE SODIUM 40 MG IV SOLR: 40.0000 mg | Freq: Two times a day (BID) | INTRAVENOUS | Status: DC

## 2017-07-17 NOTE — Progress Notes (Addendum)
VASCULAR LAB PRELIMINARY  PRELIMINARY  PRELIMINARY  PRELIMINARY  Bilateral lower extremity venous duplex completed.    Preliminary report:  There is no obvious evidence of DVT or SVT noted in the visualized veins of the lower extremities. Previous DVT found 05/2017 appear to have resolved.  Shawna Schneider, RVT 07/17/2017, 7:43 PM

## 2017-07-17 NOTE — Consult Note (Signed)
[facility] Referring Provider: ER asked me to consult Primary Care Physician:  [name], MD Primary Gastroenterologist:  Dr. Ardis Hughs  Reason for Consultation: GI bleeding   HPI:  Shawna Schneider is a 82 y.o. female who I diagnosed with metastatic colon cancer after she presented with overt GI bleeding following tPA for stroke symptoms 2 months ago.  Colonoscopy at that time was incomplete due to the large left colon mass. Imaging has showed carcinomatosis, lung lesions, abdominal adenopathy  After deliberation, she was started on eliquis in 05/2017.  She completed one cycle of FOLFOX last week and the second cycle is scheduled for Monday.  Dark stools 1-2 a day, started 2 days ago. She eventually was told to come to ER.  No CP, no SOB.  No nausea or vomiting.  No abd pains.   Past Medical History:  Diagnosis Date  . Hyperlipemia   . Solitary kidney     Past Surgical History:  Procedure Laterality Date  . COLONOSCOPY WITH PROPOFOL N/A 05/28/2017   Procedure: COLONOSCOPY WITH PROPOFOL;  Surgeon: Milus Banister, MD;  Location: [facility] ENDOSCOPY;  Service: Endoscopy;  Laterality: N/A;  . ESOPHAGOGASTRODUODENOSCOPY N/A 05/28/2017   Procedure: ESOPHAGOGASTRODUODENOSCOPY (EGD);  Surgeon: Milus Banister, MD;  Location: [facility] ENDOSCOPY;  Service: Endoscopy;  Laterality: N/A;  . IR FLUORO GUIDE PORT INSERTION RIGHT  06/23/2017  . IR US GUIDE VASC ACCESS RIGHT  06/23/2017  . KIDNEY SURGERY      Prior to Admission medications   Medication Sig Start Date End Date Taking? Authorizing Provider  acetaminophen (TYLENOL) 500 MG tablet Take 1,000 mg every 8 (eight) hours as needed by mouth for moderate pain.   Yes [provider]  allopurinol (ZYLOPRIM) 100 MG tablet Take 100 mg by mouth daily. 02/25/17  Yes [provider]  apixaban (ELIQUIS) 2.5 MG TABS tablet Take 1 tablet (2.5 mg total) by mouth 2 (two) times daily. 06/03/17  Yes [name], MD   diltiazem (CARTIA XT) 180 MG 24 hr capsule Take 180 mg by mouth daily.   Yes [provider]  HYDROcodone-acetaminophen (NORCO) 5-325 MG tablet Take 0.5-1 tablets every 6 (six) hours as needed by mouth for moderate pain. 06/28/17  Yes Owens Shark, NP  levothyroxine (SYNTHROID, LEVOTHROID) 75 MCG tablet Take 75 mcg by mouth daily. 04/16/17  Yes [provider]  lidocaine-prilocaine (EMLA) cream Apply to port site one hour prior to use. Do not rub in. Cover with plastic. 06/16/17  Yes Ladell Pier, MD  loperamide (IMODIUM) 2 MG capsule Take 2 mg by mouth as needed for diarrhea or loose stools.   Yes [provider]  prednisoLONE acetate (PRED FORTE) 1 % ophthalmic suspension Place 1 drop into the left eye daily as needed.  03/19/17  Yes [provider]  prochlorperazine (COMPAZINE) 5 MG tablet Take 1 tablet (5 mg total) every 6 (six) hours as needed by mouth for nausea or vomiting. 06/16/17  Yes Ladell Pier, MD  rosuvastatin (CRESTOR) 20 MG tablet Take 5 mg daily by mouth. Pt breaks meds into 1/4   Yes [provider]  valsartan-hydrochlorothiazide (DIOVAN-HCT) 320-12.5 MG tablet Take 1 tablet by mouth daily. 04/16/17  Yes [provider]    Current Facility-Administered Medications  Medication Dose Route Frequency Provider Last Rate Last Dose  . acetaminophen (TYLENOL) tablet 650 mg  650 mg Oral Q6H PRN Donne Hazel, MD       Or  . acetaminophen (TYLENOL) suppository 650  mg  650 mg Rectal Q6H PRN Donne Hazel, MD      . allopurinol (ZYLOPRIM) tablet 100 mg  100 mg Oral Daily Donne Hazel, MD      . diltiazem (CARDIZEM CD) 24 hr capsule 180 mg  180 mg Oral Daily Donne Hazel, MD      . irbesartan (AVAPRO) tablet 300 mg  300 mg Oral Daily Donne Hazel, MD       And  . hydrochlorothiazide (MICROZIDE) capsule 12.5 mg  12.5 mg Oral Daily Donne Hazel, MD      . Derrill Memo ON 07/18/2017] levothyroxine (SYNTHROID, LEVOTHROID)  tablet 75 mcg  75 mcg Oral QAC breakfast Donne Hazel, MD      . pantoprazole (PROTONIX) injection 40 mg  40 mg Intravenous Q12H Donne Hazel, MD      . rosuvastatin (CRESTOR) tablet 5 mg  5 mg Oral q1800 Donne Hazel, MD        Allergies as of 07/17/2017 - Review Complete 07/17/2017  Allergen Reaction Noted  . Penicillins Other (See Comments) 05/06/2012    No family history on file.  Social History   Socioeconomic History  . Marital status: Divorced    Spouse name: Not on file  . Number of children: Not on file  . Years of education: Not on file  . Highest education level: Not on file  Social Needs  . Financial resource strain: Not on file  . Food insecurity - worry: Not on file  . Food insecurity - inability: Not on file  . Transportation needs - medical: Not on file  . Transportation needs - non-medical: Not on file  Occupational History  . Not on file  Tobacco Use  . Smoking status: Never Smoker  . Smokeless tobacco: Never Used  Substance and Sexual Activity  . Alcohol use: No  . Drug use: No  . Sexual activity: Not on file  Other Topics Concern  . Not on file  Social History Narrative  . Not on file     Review of Systems: Pertinent positive and negative review of systems were noted in the above HPI section. Complete review of systems was performed and was otherwise normal.   Physical Exam: Vital signs in last 24 hours: Temp:  [98.2 F (36.8 C)] 98.2 F (36.8 C) (12/08 1011) Pulse Rate:  [82-111] 106 (12/08 1605) Resp:  [14-30] 20 (12/08 1605) BP: (103-118)/(65-96) 108/73 (12/08 1605) SpO2:  [92 %-100 %] 96 % (12/08 1605)   Constitutional: generally well-appearing Psychiatric: alert and oriented x3 Eyes: extraocular movements intact Mouth: oral pharynx moist, no lesions Neck: supple no lymphadenopathy Cardiovascular: heart regular rate and rhythm Lungs: clear to auscultation bilaterally Abdomen: soft, nontender, nondistended, no obvious  ascites, no peritoneal signs, normal bowel sounds Extremities: no lower extremity edema bilaterally Skin: no lesions on visible extremities   Lab Results: Recent Labs    07/17/17 1150  WBC 3.7*  HGB 9.9*  HCT 31.0*  PLT 427*  MCV 84.9   BMET Recent Labs    07/17/17 1150  NA 136  K 4.1  CL 107  CO2 20*  GLUCOSE 92  BUN 29*  CREATININE 2.28*  CALCIUM 9.8   LFT Recent Labs    07/17/17 1150  BILITOT 0.4  AST 16  ALT 10*  ALKPHOS 128*  PROT 6.5  ALBUMIN 2.7*   PT/INR Recent Labs    07/17/17 1150  LABPROT 16.8*  INR 1.37  Impression/Plan: 82 y.o. female Known metastatic colon cancer (carcinomatosis, lung nodules) with overt GI bleeding.  The bleeding has been dark, but I'm fairly certain she is bleeding (slowly) from the primary colon tumor.  I did EGD 2 months ago at the time of original cancer diagnosis and it was essentially normal.  I expect her bleeding will stop as the eliquis washes out.  I have no plans for invasive GI testing, however please call or page if she still has overt bleeding despite 2-3 days eliquis washout.  I will advance her diet and change PPI to once daily orally.   I consider her to be at high risk for rebleeding if a blood thinner is ever restarted and I advise against it.   I will order lower extremity dopplers to see if she still has DVTs.  If she does, then a decision will have to be made about IVC filter or not. It may be helpful to get input from neurology for this decision.   Milus Banister, MD  07/17/2017, 5:18 PM [facility] Pager [phone]

## 2017-07-17 NOTE — ED Provider Notes (Signed)
[facility] Provider Note   CSN: [id number] Arrival date & time: 07/17/17  1001     History   Chief Complaint Chief Complaint  Patient presents with  . Melena    HPI Shawna Schneider is a 82 y.o. female.  HPI 82 year old female with past medical history of colon cancer, DVT on Coumadin, who presents with melena.  The patient just received 2 units of blood at her oncologist on 12/3 following worsening anemia.  She states that since then, she felt mildly improved but now has begun to have dark black, tarry stools.  She has had associated generalized weakness and fatigue.  No chest pain or syncope.  She has a history of ulcer in the past but has not recently had an EGD or colonoscopy according her report.  She denies any NSAID use.  Of note, she is on Eliquis and has been taking this as prescribed.  Denies any rectal pain.  Denies any bright red blood per rectum or vomiting.  Past Medical History:  Diagnosis Date  . Hyperlipemia   . Solitary kidney     Patient Active Problem List   Diagnosis Date Noted  . Upper GI bleed 07/17/2017  . Adenocarcinoma carcinomatosis ([place]) 06/16/2017  . Goals of care, counseling/discussion 06/16/2017  . Metastasis from colon cancer ([place])   . Acute deep vein thrombosis (DVT) of distal vein of both lower extremities ([facility])   . Gastrointestinal hemorrhage   . Malignant neoplasm of descending colon ([place])   . Acute ischemic stroke ([place]) 05/24/2017    Past Surgical History:  Procedure Laterality Date  . COLONOSCOPY WITH PROPOFOL N/A 05/28/2017   Procedure: COLONOSCOPY WITH PROPOFOL;  Surgeon: Milus Banister, MD;  Location[facility]COPY;  Service: Endoscopy;  Laterality: N/A;  . ESOPHAGOGASTRODUODENOSCOPY N/A 05/28/2017   Procedure: ESOPHAGOGASTRODUODENOSCOPY (EGD);  Surgeon: Milus Banister, MD;  Location[facility] ENDOSCOPY;  Service: Endoscopy;  Laterality: N/A;  . IR FLUORO GUIDE PORT INSERTION RIGHT  06/23/2017    . IR US GUIDE VASC ACCESS RIGHT  06/23/2017  . KIDNEY SURGERY      OB History    No data available       Home Medications    Prior to Admission medications   Medication Sig Start Date End Date Taking? Authorizing Provider  acetaminophen (TYLENOL) 500 MG tablet Take 1,000 mg every 8 (eight) hours as needed by mouth for moderate pain.   Yes [provider]  allopurinol (ZYLOPRIM) 100 MG tablet Take 100 mg by mouth daily. 02/25/17  Yes [provider]  apixaban (ELIQUIS) 2.5 MG TABS tablet Take 1 tablet (2.5 mg total) by mouth 2 (two) times daily. 06/03/17  Yes [name], MD  diltiazem (CARTIA XT) 180 MG 24 hr capsule Take 180 mg by mouth daily.   Yes [provider]  HYDROcodone-acetaminophen (NORCO) 5-325 MG tablet Take 0.5-1 tablets every 6 (six) hours as needed by mouth for moderate pain. 06/28/17  Yes Owens Shark, NP  levothyroxine (SYNTHROID, LEVOTHROID) 75 MCG tablet Take 75 mcg by mouth daily. 04/16/17  Yes [provider]  lidocaine-prilocaine (EMLA) cream Apply to port site one hour prior to use. Do not rub in. Cover with plastic. 06/16/17  Yes Ladell Pier, MD  loperamide (IMODIUM) 2 MG capsule Take 2 mg by mouth as needed for diarrhea or loose stools.   Yes [provider]  prednisoLONE acetate (PRED FORTE) 1 % ophthalmic suspension Place 1 drop into the left  eye daily as needed.  03/19/17  Yes [provider]  prochlorperazine (COMPAZINE) 5 MG tablet Take 1 tablet (5 mg total) every 6 (six) hours as needed by mouth for nausea or vomiting. 06/16/17  Yes Ladell Pier, MD  rosuvastatin (CRESTOR) 20 MG tablet Take 5 mg daily by mouth. Pt breaks meds into 1/4   Yes [provider]  valsartan-hydrochlorothiazide (DIOVAN-HCT) 320-12.5 MG tablet Take 1 tablet by mouth daily. 04/16/17  Yes [provider]    Family History No family history on file.  Social History Social History   Tobacco Use  .  Smoking status: Never Smoker  . Smokeless tobacco: Never Used  Substance Use Topics  . Alcohol use: No  . Drug use: No     Allergies   Penicillins   Review of Systems Review of Systems  Constitutional: Negative for chills and fever.  HENT: Negative for congestion, rhinorrhea and sore throat.   Eyes: Negative for visual disturbance.  Respiratory: Negative for cough, shortness of breath and wheezing.   Cardiovascular: Negative for chest pain and leg swelling.  Gastrointestinal: Positive for abdominal pain and blood in stool. Negative for diarrhea, nausea and vomiting.  Genitourinary: Negative for dysuria, flank pain, vaginal bleeding and vaginal discharge.  Musculoskeletal: Negative for neck pain.  Skin: Negative for rash.  Allergic/Immunologic: Negative for immunocompromised state.  Neurological: Negative for syncope and headaches.  Hematological: Does not bruise/bleed easily.  All other systems reviewed and are negative.    Physical Exam Updated Vital Signs BP (!) 166/77 (BP Location: Left Arm)   Pulse (!) 109   Temp 99.7 F (37.6 C) (Oral)   Resp 18   Ht 5\' 3"  (1.6 m)   Wt 79.6 kg (175 lb 7.8 oz)   SpO2 99%   BMI 31.09 kg/m   Physical Exam  Constitutional: She is oriented to person, place, and time. She appears well-developed and well-nourished. No distress.  HENT:  Head: Normocephalic and atraumatic.  Mild subconjunctival pallor  Eyes: Conjunctivae are normal.  Neck: Neck supple.  Cardiovascular: Normal rate, regular rhythm and normal heart sounds. Exam reveals no friction rub.  No murmur heard. Pulmonary/Chest: Effort normal and breath sounds normal. No respiratory distress. She has no wheezes. She has no rales.  Abdominal: Soft. Bowel sounds are normal. She exhibits no distension.  Genitourinary:  Genitourinary Comments: Gross melena on exam.  No bright red blood.  Musculoskeletal: She exhibits no edema.  Neurological: She is alert and oriented to person,  place, and time. She exhibits normal muscle tone.  Skin: Skin is warm. Capillary refill takes less than 2 seconds.  Psychiatric: She has a normal mood and affect.  Nursing note and vitals reviewed.    ED Treatments / Results  Labs (all labs ordered are listed, but only abnormal results are displayed) Labs Reviewed  CBC WITH DIFFERENTIAL/PLATELET - Abnormal; Notable for the following components:      Result Value   WBC 3.7 (*)    RBC 3.65 (*)    Hemoglobin 9.9 (*)    HCT 31.0 (*)    RDW 17.7 (*)    Platelets 427 (*)    Neutro Abs 1.5 (*)    Monocytes Absolute 1.3 (*)    All other components within normal limits  COMPREHENSIVE METABOLIC PANEL - Abnormal; Notable for the following components:   CO2 20 (*)    BUN 29 (*)    Creatinine, Ser 2.28 (*)    Albumin 2.7 (*)  ALT 10 (*)    Alkaline Phosphatase 128 (*)    GFR calc non Af Amer 19 (*)    GFR calc Af Amer 22 (*)    All other components within normal limits  PROTIME-INR - Abnormal; Notable for the following components:   Prothrombin Time 16.8 (*)    All other components within normal limits  POC OCCULT BLOOD, ED - Abnormal; Notable for the following components:   Fecal Occult Bld POSITIVE (*)    All other components within normal limits  COMPREHENSIVE METABOLIC PANEL  CBC  TYPE AND SCREEN    EKG  EKG Interpretation None       Radiology No results found.  Procedures Procedures (including critical care time)  Medications Ordered in ED Medications  acetaminophen (TYLENOL) tablet 650 mg (not administered)    Or  acetaminophen (TYLENOL) suppository 650 mg (not administered)  pantoprazole (PROTONIX) injection 40 mg (not administered)  allopurinol (ZYLOPRIM) tablet 100 mg (not administered)  diltiazem (CARDIZEM CD) 24 hr capsule 180 mg (not administered)  levothyroxine (SYNTHROID, LEVOTHROID) tablet 75 mcg (not administered)  rosuvastatin (CRESTOR) tablet 5 mg (not administered)  irbesartan (AVAPRO) tablet  300 mg (not administered)    And  hydrochlorothiazide (MICROZIDE) capsule 12.5 mg (not administered)  pantoprazole (PROTONIX) 80 mg in sodium chloride 0.9 % 100 mL IVPB (0 mg Intravenous Stopped 07/17/17 1611)     Initial Impression / Assessment and Plan / ED Course  I have reviewed the triage vital signs and the nursing notes.  Pertinent labs & imaging results that were available during my care of the patient were reviewed by me and considered in my medical decision making (see chart for details).     82 year old female here with melena in the setting of Eliquis use and known colonic adenocarcinoma.  I suspect this is potential bleeding from her colon, versus possible upper GI source given that it is mostly melena.  Her hemoglobin today is stable after 2 units of transfusion the other day, but given her gross melena in the setting of Eliquis use, will admit for observation.  Discussed with Dr. Ardis Hughs of [facility] who will see the patient in the morning.  Final Clinical Impressions(s) / ED Diagnoses   Final diagnoses:  Melena    ED Discharge Orders    None       Duffy Bruce, MD 07/17/17 1736

## 2017-07-17 NOTE — ED Triage Notes (Signed)
Patient here with complaints of dark stools for 3 days. Sent by oncologist. Denies nausea, vomiting.

## 2017-07-17 NOTE — H&P (Signed)
History and Physical    Shawna Schneider:[id number] DOB: 1935-02-22 DOA: 07/17/2017  PCP: Shawna Pretty, MD  Patient coming from: Home  Chief Complaint: Melena  HPI: Shawna Schneider is a 82 y.o. female with medical history significant of metastatic adenocarcinoma of the colon, B LE DVT diagnosed in 10/18 on therapeutic anticoagulation, prior GI bleeding, renal cell cancer in 20056 s/p L nephrectomy who presents to the ED with complaints of dark stools that started on 12/6. Patient did contact her Oncologist who did transfuse 2 units PRBC's one day prior to admission. Patient was referred to ED by Oncologist for further work up. Pt denies abd pain or sob. No chest pain. No hematemesis  ED Course: In the ED, pt was found to have heme pos stools. Gross melena was noted. Patient was given IV PPI. GI consulted. Hospitalist consulted for consideration for admission  Review of Systems:  Review of Systems  Constitutional: Negative for chills, fever, malaise/fatigue and weight loss.  HENT: Negative for congestion, ear discharge, ear pain and nosebleeds.   Eyes: Negative for double vision, photophobia, pain and discharge.  Respiratory: Negative for hemoptysis, sputum production and shortness of breath.   Cardiovascular: Negative for palpitations, orthopnea, claudication and leg swelling.  Gastrointestinal: Positive for blood in stool and melena. Negative for abdominal pain, nausea and vomiting.  Genitourinary: Negative for frequency, hematuria and urgency.  Musculoskeletal: Negative for back pain, joint pain and neck pain.  Neurological: Negative for tingling, tremors, sensory change, seizures and loss of consciousness.  Psychiatric/Behavioral: Negative for hallucinations, memory loss and substance abuse.    Past Medical History:  Diagnosis Date  . Hyperlipemia   . Solitary kidney     Past Surgical History:  Procedure Laterality Date  . COLONOSCOPY WITH PROPOFOL N/A 05/28/2017   Procedure: COLONOSCOPY WITH PROPOFOL;  Surgeon: Milus Banister, MD;  Location: [facility] ENDOSCOPY;  Service: Endoscopy;  Laterality: N/A;  . ESOPHAGOGASTRODUODENOSCOPY N/A 05/28/2017   Procedure: ESOPHAGOGASTRODUODENOSCOPY (EGD);  Surgeon: Milus Banister, MD;  Location: [facility] ENDOSCOPY;  Service: Endoscopy;  Laterality: N/A;  . IR FLUORO GUIDE PORT INSERTION RIGHT  06/23/2017  . IR US GUIDE VASC ACCESS RIGHT  06/23/2017  . KIDNEY SURGERY       reports that  has never smoked. she has never used smokeless tobacco. She reports that she does not drink alcohol or use drugs.  Allergies  Allergen Reactions  . Penicillins Other (See Comments)    Has patient had a PCN reaction causing immediate rash, facial/tongue/throat swelling, SOB or lightheadedness with hypotension: unkn Has patient had a PCN reaction causing severe rash involving mucus membranes or skin necrosis: unkn Has patient had a PCN reaction that required hospitalization: unkn Has patient had a PCN reaction occurring within the last 10 years: unkn If all of the above answers are "NO", then may proceed with Cephalosporin use.     No family history on file.  Mother with HTN  Prior to Admission medications   Medication Sig Start Date End Date Taking? Authorizing Provider  acetaminophen (TYLENOL) 500 MG tablet Take 1,000 mg every 8 (eight) hours as needed by mouth for moderate pain.   Yes [provider]  allopurinol (ZYLOPRIM) 100 MG tablet Take 100 mg by mouth daily. 02/25/17  Yes [provider]  apixaban (ELIQUIS) 2.5 MG TABS tablet Take 1 tablet (2.5 mg total) by mouth 2 (two) times daily. 06/03/17  Yes Shawna Hawking, MD  diltiazem (CARTIA XT) 180 MG 24 hr capsule Take 180  mg by mouth daily.   Yes [provider]  HYDROcodone-acetaminophen (NORCO) 5-325 MG tablet Take 0.5-1 tablets every 6 (six) hours as needed by mouth for moderate pain. 06/28/17  Yes Shawna Shark, NP  levothyroxine (SYNTHROID, LEVOTHROID) 75  MCG tablet Take 75 mcg by mouth daily. 04/16/17  Yes [provider]  lidocaine-prilocaine (EMLA) cream Apply to port site one hour prior to use. Do not rub in. Cover with plastic. 06/16/17  Yes Shawna Pier, MD  loperamide (IMODIUM) 2 MG capsule Take 2 mg by mouth as needed for diarrhea or loose stools.   Yes [provider]  prednisoLONE acetate (PRED FORTE) 1 % ophthalmic suspension Place 1 drop into the left eye daily as needed.  03/19/17  Yes [provider]  prochlorperazine (COMPAZINE) 5 MG tablet Take 1 tablet (5 mg total) every 6 (six) hours as needed by mouth for nausea or vomiting. 06/16/17  Yes Shawna Pier, MD  rosuvastatin (CRESTOR) 20 MG tablet Take 5 mg daily by mouth. Pt breaks meds into 1/4   Yes [provider]  valsartan-hydrochlorothiazide (DIOVAN-HCT) 320-12.5 MG tablet Take 1 tablet by mouth daily. 04/16/17  Yes [provider]    Physical Exam: Vitals:   07/17/17 1011 07/17/17 1230 07/17/17 1300 07/17/17 1330  BP: 107/72 (!) 114/96 118/76 115/81  Pulse: 83 86 82 83  Resp: 20 (!) 29 (!) 25 (!) 30  Temp: 98.2 F (36.8 C)     SpO2: 100% 97% 99% 97%    Constitutional: NAD, calm, comfortable Vitals:   07/17/17 1011 07/17/17 1230 07/17/17 1300 07/17/17 1330  BP: 107/72 (!) 114/96 118/76 115/81  Pulse: 83 86 82 83  Resp: 20 (!) 29 (!) 25 (!) 30  Temp: 98.2 F (36.8 C)     SpO2: 100% 97% 99% 97%   Eyes: PERRL, lids and conjunctivae normal ENMT: Mucous membranes are moist. Posterior pharynx clear of any exudate or lesions.Normal dentition.  Neck: normal, supple, no masses, no thyromegaly Respiratory: clear to auscultation bilaterally, no wheezing, no crackles. Normal respiratory effort. No accessory muscle use.  Cardiovascular: Regular rate and rhythm, no murmurs / rubs / gallops. No extremity edema. 2+ pedal pulses. No carotid bruits.  Abdomen: no tenderness, no masses palpated. No hepatosplenomegaly. Bowel sounds  positive.  Musculoskeletal: no clubbing / cyanosis. No joint deformity upper and lower extremities. Good ROM, no contractures. Normal muscle tone.  Skin: no rashes, lesions, ulcers. No induration Neurologic: CN 2-12 grossly intact. Sensation intact, DTR normal. Strength 5/5 in all 4.  Psychiatric: Normal judgment and insight. Alert and oriented x 3. Normal mood.    Labs on Admission: I have personally reviewed following labs and imaging studies  CBC: Recent Labs  Lab 07/12/17 1249 07/17/17 1150  WBC 4.5 3.7*  NEUTROABS 2.9 1.5*  HGB 7.3* 9.9*  HCT 22.9* 31.0*  MCV 83.6 84.9  PLT 311 427*   Basic Metabolic Panel: Recent Labs  Lab 07/12/17 1249 07/17/17 1150  NA 137 136  K 3.9 4.1  CL  --  107  CO2 19* 20*  GLUCOSE 84 92  BUN 47.4* 29*  CREATININE 3.8* 2.28*  CALCIUM 10.1 9.8   GFR: Estimated Creatinine Clearance: 19.7 mL/min (A) (by C-G formula based on SCr of 2.28 mg/dL (H)). Liver Function Tests: Recent Labs  Lab 07/12/17 1249 07/17/17 1150  AST 19 16  ALT 14 10*  ALKPHOS 163* 128*  BILITOT 0.38 0.4  PROT 6.7 6.5  ALBUMIN 2.6* 2.7*  No results for input(s): LIPASE, AMYLASE in the last 168 hours. No results for input(s): AMMONIA in the last 168 hours. Coagulation Profile: Recent Labs  Lab 07/17/17 1150  INR 1.37   Cardiac Enzymes: No results for input(s): CKTOTAL, CKMB, CKMBINDEX, TROPONINI in the last 168 hours. BNP (last 3 results) No results for input(s): PROBNP in the last 8760 hours. HbA1C: No results for input(s): HGBA1C in the last 72 hours. CBG: No results for input(s): GLUCAP in the last 168 hours. Lipid Profile: No results for input(s): CHOL, HDL, LDLCALC, TRIG, CHOLHDL, LDLDIRECT in the last 72 hours. Thyroid Function Tests: No results for input(s): TSH, T4TOTAL, FREET4, T3FREE, THYROIDAB in the last 72 hours. Anemia Panel: No results for input(s): VITAMINB12, FOLATE, FERRITIN, TIBC, IRON, RETICCTPCT in the last 72 hours. Urine  analysis: No results found for: COLORURINE, APPEARANCEUR, LABSPEC, PHURINE, GLUCOSEU, HGBUR, BILIRUBINUR, KETONESUR, PROTEINUR, UROBILINOGEN, NITRITE, LEUKOCYTESUR Sepsis Labs: !!!!!!!!!!!!!!!!!!!!!!!!!!!!!!!!!!!!!!!!!!!! @LABRCNTIP (procalcitonin:4,lacticidven:4) )No results found for this or any previous visit (from the past 240 hour(s)).   Radiological Exams on Admission: No results found.  EKG: Independently reviewed. NSR  Assessment/Plan Principal Problem:   Gastrointestinal hemorrhage Active Problems:   Metastasis from colon cancer (HCC)   Acute deep vein thrombosis (DVT) of distal vein of both lower extremities (HCC)   1. Upper GI bleed 1. Gross melena with heme pos stools noted 2. Patient did receive 2 units PRBC transfusion one day prior to admit 3. Hemodynamically stable 4. GI consulted through ED 5. Will continue on IV PPI q12hrs 6. Cont clear liquid diet, NPO after midnight 2. Hx metastatic colon cancer 1. Followed by Oncology 2. Appears stable at this time 3. Hx BLE DVT on therapeutic anticoagulation 1. Will hold anticoagulation secondary to concerns of GI bleed 2. Cont on compression stockings, resume anticoagulation when/if OK with GI  DVT prophylaxis: compression stockings  Code Status: Full Family Communication: Pt in room  Disposition Plan: Uncertain at this time  Consults called: GI Admission status: Observation as would likley require less than 2 midnight stay for management for upper GI bleed   Marylu Lund MD [facility] Pager [phone]  If 7PM-7AM, please contact night-coverage [email or web address] Password [email or web address]  07/17/2017, 3:00 PM

## 2017-07-18 ENCOUNTER — Telehealth: Payer: Self-pay | Admitting: *Deleted

## 2017-07-18 DIAGNOSIS — D62 Acute posthemorrhagic anemia: Secondary | ICD-10-CM | POA: Diagnosis present

## 2017-07-18 DIAGNOSIS — Z85528 Personal history of other malignant neoplasm of kidney: Secondary | ICD-10-CM | POA: Diagnosis not present

## 2017-07-18 DIAGNOSIS — C7800 Secondary malignant neoplasm of unspecified lung: Secondary | ICD-10-CM | POA: Diagnosis present

## 2017-07-18 DIAGNOSIS — C189 Malignant neoplasm of colon, unspecified: Secondary | ICD-10-CM | POA: Diagnosis not present

## 2017-07-18 DIAGNOSIS — D638 Anemia in other chronic diseases classified elsewhere: Secondary | ICD-10-CM | POA: Diagnosis present

## 2017-07-18 DIAGNOSIS — E785 Hyperlipidemia, unspecified: Secondary | ICD-10-CM | POA: Diagnosis present

## 2017-07-18 DIAGNOSIS — I824Z3 Acute embolism and thrombosis of unspecified deep veins of distal lower extremity, bilateral: Secondary | ICD-10-CM

## 2017-07-18 DIAGNOSIS — C799 Secondary malignant neoplasm of unspecified site: Secondary | ICD-10-CM | POA: Diagnosis not present

## 2017-07-18 DIAGNOSIS — N183 Chronic kidney disease, stage 3 (moderate): Secondary | ICD-10-CM | POA: Diagnosis present

## 2017-07-18 DIAGNOSIS — Z79899 Other long term (current) drug therapy: Secondary | ICD-10-CM | POA: Diagnosis not present

## 2017-07-18 DIAGNOSIS — D68318 Other hemorrhagic disorder due to intrinsic circulating anticoagulants, antibodies, or inhibitors: Secondary | ICD-10-CM | POA: Diagnosis present

## 2017-07-18 DIAGNOSIS — Z86718 Personal history of other venous thrombosis and embolism: Secondary | ICD-10-CM | POA: Diagnosis not present

## 2017-07-18 DIAGNOSIS — C786 Secondary malignant neoplasm of retroperitoneum and peritoneum: Secondary | ICD-10-CM | POA: Diagnosis present

## 2017-07-18 DIAGNOSIS — K921 Melena: Secondary | ICD-10-CM | POA: Diagnosis present

## 2017-07-18 DIAGNOSIS — Z905 Acquired absence of kidney: Secondary | ICD-10-CM | POA: Diagnosis not present

## 2017-07-18 DIAGNOSIS — K922 Gastrointestinal hemorrhage, unspecified: Secondary | ICD-10-CM | POA: Diagnosis not present

## 2017-07-18 DIAGNOSIS — I129 Hypertensive chronic kidney disease with stage 1 through stage 4 chronic kidney disease, or unspecified chronic kidney disease: Secondary | ICD-10-CM | POA: Diagnosis present

## 2017-07-18 DIAGNOSIS — Z880 Allergy status to penicillin: Secondary | ICD-10-CM | POA: Diagnosis not present

## 2017-07-18 LAB — COMPREHENSIVE METABOLIC PANEL
ALT: 9 U/L — AB (ref 14–54)
AST: 18 U/L (ref 15–41)
Albumin: 2.5 g/dL — ABNORMAL LOW (ref 3.5–5.0)
Alkaline Phosphatase: 116 U/L (ref 38–126)
Anion gap: 7 (ref 5–15)
BUN: 28 mg/dL — AB (ref 6–20)
CHLORIDE: 109 mmol/L (ref 101–111)
CO2: 21 mmol/L — AB (ref 22–32)
CREATININE: 2.42 mg/dL — AB (ref 0.44–1.00)
Calcium: 9.6 mg/dL (ref 8.9–10.3)
GFR, EST AFRICAN AMERICAN: 21 mL/min — AB (ref >60)
GFR, EST NON AFRICAN AMERICAN: 18 mL/min — AB (ref >60)
Glucose, Bld: 137 mg/dL — ABNORMAL HIGH (ref 65–99)
POTASSIUM: 4 mmol/L (ref 3.5–5.1)
Sodium: 137 mmol/L (ref 135–145)
Total Bilirubin: 0.5 mg/dL (ref 0.3–1.2)
Total Protein: 6.1 g/dL — ABNORMAL LOW (ref 6.5–8.1)

## 2017-07-18 LAB — CBC
HEMATOCRIT: 28.5 % — AB (ref 36.0–46.0)
Hemoglobin: 9.1 g/dL — ABNORMAL LOW (ref 12.0–15.0)
MCH: 27.5 pg (ref 26.0–34.0)
MCHC: 31.9 g/dL (ref 30.0–36.0)
MCV: 86.1 fL (ref 78.0–100.0)
Platelets: 409 10*3/uL — ABNORMAL HIGH (ref 150–400)
RBC: 3.31 MIL/uL — AB (ref 3.87–5.11)
RDW: 17.8 % — ABNORMAL HIGH (ref 11.5–15.5)
WBC: 4 10*3/uL (ref 4.0–10.5)

## 2017-07-18 MED ORDER — PREDNISOLONE ACETATE 1 % OP SUSP
1.0000 [drp] | Freq: Every day | OPHTHALMIC | Status: DC
  Administered 2017-07-18 – 2017-07-19 (×2): 1 [drp] via OPHTHALMIC
  Filled 2017-07-18: qty 5

## 2017-07-18 NOTE — Telephone Encounter (Signed)
Attempted to call patient to let patient know that the Cancer Center will be closed tomorrow 07/19/2017 and that someone from the Cancer Center will call to reschedule appointment. Left voicemail with information. 

## 2017-07-18 NOTE — Progress Notes (Signed)
Pt states she is not going to take Protonix anymore because she has only one kidney.

## 2017-07-18 NOTE — Progress Notes (Signed)
PROGRESS NOTE    JARVIS KNODEL  EGB:[id number] DOB: 26-Sep-1934 DOA: 07/17/2017 PCP: [name], MD   Brief Narrative: 82 y.o. female with medical history significant of metastatic adenocarcinoma of the colon, B LE DVT diagnosed in 10/18 on therapeutic anticoagulation, prior GI bleeding, renal cell cancer in [address]56 s/p L nephrectomy who presents to the ED with complaints of dark stools that started on 12/6. Patient did contact her Oncologist who did transfuse 2 units PRBC's one day prior to admission. Patient was referred to ED by Oncologist for further work up. Pt denies abd pain or sob. No chest pain. No hematemesis   In the ED, pt was found to have heme pos stools. Gross melena was noted. Patient was given IV PPI. GI consulted.   Assessment & Plan:   #GI bleed with melena and heme positive stool: Evaluated by GI, the bleeding from the primary colon tumor.  As per GI, the upper GI endoscopy done 2 months ago was essentially normal.  The bleeding was likely in the setting of Eliquis.  The anticoagulation is discontinued.  Continue to monitor and watch for any sign of bleeding.  The bleeding is expected to stay off as the Eliquis was stopped.  No invasive procedure planned.  Continue Protonix. -GI consult appreciated. -Tolerating diet well.   #Normocytic anemia likely multifactorial including acute blood loss, chronic disease: Monitor CBC, hemoglobin trending down.  #History of metastatic colon cancer: Followed by oncology.  #History of bilateral lower extremity DVT on therapeutic anticoagulation: The repeat Doppler ultrasound negative for DVTs.  Anticoagulation discontinued because of GI bleed.  Recommend outpatient follow-up.  #Essential hypertension: Blood pressure on the lower side.  I will discontinue Microzide and Avapro for now.  Continue diltiazem.  Monitor blood pressure and heart rate.  #Chronic kidney disease is stage III: Serum creatinine level around baseline.  Holding  Avapro and hydrochlorothiazide because of decreased oral intake and GI bleeding.  Order PT OT evaluation.  DVT prophylaxis: SCD Code Status: Full code Family Communication: No family at bedside Disposition Plan: Currently admitted.    Consultants:   GI  Procedures: None Antimicrobials: None  Subjective: Seen and examined at bedside.  Patient denied headache, dizziness, nausea vomiting chest pain shortness of breath.  Reported he still having black colored stool  Objective: Vitals:   07/17/17 1605 07/17/17 1724 07/17/17 2027 07/18/17 0455  BP: 108/73 (!) 166/77 107/71 94/60  Pulse: (!) 106 (!) 109 (!) 110 (!) 106  Resp: 20 18 16 14   Temp:  99.7 F (37.6 C) 99.4 F (37.4 C) 99.1 F (37.3 C)  TempSrc:  Oral Oral Oral  SpO2: 96% 99% 98% 98%  Weight:  79.6 kg (175 lb 7.8 oz)    Height:  5\' 3"  (1.6 m)      Intake/Output Summary (Last 24 hours) at 07/18/2017 1139 Last data filed at 07/18/2017 0845 Gross per 24 hour  Intake 715 ml  Output -  Net 715 ml   Filed Weights   07/17/17 1724  Weight: 79.6 kg (175 lb 7.8 oz)    Examination:  General exam: Appears calm and comfortable  Respiratory system: Clear to auscultation. Respiratory effort normal. No wheezing or crackle Cardiovascular system: S1 & S2 heard, RRR.  No pedal edema. Gastrointestinal system: Abdomen is nondistended, soft and nontender. Normal bowel sounds heard. Central nervous system: Alert and oriented. No focal neurological deficits. Skin: No rashes, lesions or ulcers Psychiatry: Judgement and insight appear normal. Mood & affect appropriate.  Data Reviewed: I have personally reviewed following labs and imaging studies  CBC: Recent Labs  Lab 07/12/17 1249 07/17/17 1150 07/18/17 0533  WBC 4.5 3.7* 4.0  NEUTROABS 2.9 1.5*  --   HGB 7.3* 9.9* 9.1*  HCT 22.9* 31.0* 28.5*  MCV 83.6 84.9 86.1  PLT 311 427* 767*   Basic Metabolic Panel: Recent Labs  Lab 07/12/17 1249 07/17/17 1150  07/18/17 0533  NA 137 136 137  K 3.9 4.1 4.0  CL  --  107 109  CO2 19* 20* 21*  GLUCOSE 84 92 137*  BUN 47.4* 29* 28*  CREATININE 3.8* 2.28* 2.42*  CALCIUM 10.1 9.8 9.6   GFR: Estimated Creatinine Clearance: 18.5 mL/min (A) (by C-G formula based on SCr of 2.42 mg/dL (H)). Liver Function Tests: Recent Labs  Lab 07/12/17 1249 07/17/17 1150 07/18/17 0533  AST 19 16 18   ALT 14 10* 9*  ALKPHOS 163* 128* 116  BILITOT 0.38 0.4 0.5  PROT 6.7 6.5 6.1*  ALBUMIN 2.6* 2.7* 2.5*   No results for input(s): LIPASE, AMYLASE in the last 168 hours. No results for input(s): AMMONIA in the last 168 hours. Coagulation Profile: Recent Labs  Lab 07/17/17 1150  INR 1.37   Cardiac Enzymes: No results for input(s): CKTOTAL, CKMB, CKMBINDEX, TROPONINI in the last 168 hours. BNP (last 3 results) No results for input(s): PROBNP in the last 8760 hours. HbA1C: No results for input(s): HGBA1C in the last 72 hours. CBG: No results for input(s): GLUCAP in the last 168 hours. Lipid Profile: No results for input(s): CHOL, HDL, LDLCALC, TRIG, CHOLHDL, LDLDIRECT in the last 72 hours. Thyroid Function Tests: No results for input(s): TSH, T4TOTAL, FREET4, T3FREE, THYROIDAB in the last 72 hours. Anemia Panel: No results for input(s): VITAMINB12, FOLATE, FERRITIN, TIBC, IRON, RETICCTPCT in the last 72 hours. Sepsis Labs: No results for input(s): PROCALCITON, LATICACIDVEN in the last 168 hours.  No results found for this or any previous visit (from the past 240 hour(s)).       Radiology Studies: No results found.      Scheduled Meds: . allopurinol  100 mg Oral Daily  . diltiazem  180 mg Oral Daily  . irbesartan  300 mg Oral Daily   And  . hydrochlorothiazide  12.5 mg Oral Daily  . levothyroxine  75 mcg Oral QAC breakfast  . pantoprazole  40 mg Oral Q0600  . rosuvastatin  5 mg Oral q1800   Continuous Infusions:   LOS: 0 days    Lindon Kiel Tanna Furry, MD [facility] Pager  [phone]  If 7PM-7AM, please contact night-coverage [email or web address] Password [email or web address] 07/18/2017, 11:39 AM

## 2017-07-19 ENCOUNTER — Other Ambulatory Visit: Payer: BC Managed Care – PPO

## 2017-07-19 ENCOUNTER — Other Ambulatory Visit: Payer: Self-pay

## 2017-07-19 ENCOUNTER — Ambulatory Visit: Payer: BC Managed Care – PPO

## 2017-07-19 ENCOUNTER — Ambulatory Visit: Payer: BC Managed Care – PPO | Admitting: Oncology

## 2017-07-19 LAB — BASIC METABOLIC PANEL
Anion gap: 6 (ref 5–15)
BUN: 27 mg/dL — AB (ref 6–20)
CALCIUM: 9.9 mg/dL (ref 8.9–10.3)
CO2: 21 mmol/L — ABNORMAL LOW (ref 22–32)
CREATININE: 2.49 mg/dL — AB (ref 0.44–1.00)
Chloride: 109 mmol/L (ref 101–111)
GFR calc Af Amer: 20 mL/min — ABNORMAL LOW (ref >60)
GFR, EST NON AFRICAN AMERICAN: 17 mL/min — AB (ref >60)
GLUCOSE: 97 mg/dL (ref 65–99)
POTASSIUM: 4.1 mmol/L (ref 3.5–5.1)
SODIUM: 136 mmol/L (ref 135–145)

## 2017-07-19 LAB — CBC
HCT: 27.9 % — ABNORMAL LOW (ref 36.0–46.0)
Hemoglobin: 9 g/dL — ABNORMAL LOW (ref 12.0–15.0)
MCH: 27.4 pg (ref 26.0–34.0)
MCHC: 32.3 g/dL (ref 30.0–36.0)
MCV: 85.1 fL (ref 78.0–100.0)
PLATELETS: 379 10*3/uL (ref 150–400)
RBC: 3.28 MIL/uL — AB (ref 3.87–5.11)
RDW: 17.7 % — AB (ref 11.5–15.5)
WBC: 5 10*3/uL (ref 4.0–10.5)

## 2017-07-19 MED ORDER — HEPARIN SOD (PORK) LOCK FLUSH 100 UNIT/ML IV SOLN
500.0000 [IU] | INTRAVENOUS | Status: AC | PRN
  Administered 2017-07-19: 500 [IU]
  Filled 2017-07-19: qty 5

## 2017-07-19 NOTE — Discharge Summary (Signed)
Physician Discharge Summary  Shawna Schneider:[id number] DOB: [birth year] DOA: 07/17/2017  PCP: [name], MD  Admit date: 07/17/2017 Discharge date: 07/19/2017  Admitted From:home Disposition:home  Recommendations for Outpatient Follow-up:  1. Follow up with PCP in 1-2 weeks 2. Please obtain BMP/CBC in one week  Home Health:no Equipment/Devices:no Discharge Condition:stable CODE STATUS:full code Diet recommendation:heart healthy  Brief/Interim Summary: 82 y.o.femalewith medical history significant ofmetastatic adenocarcinoma of the colon, B LE DVT diagnosed in 10/18 on therapeutic anticoagulation, prior GI bleeding, renal cell cancer in 20056 s/p L nephrectomy who presents to the ED with complaints of dark stools that started on 12/6. Patient did contact her Oncologist who did transfuse 2 units PRBC's one day prior to admission. Patient was referred to ED by Oncologist for further work up. Pt denies abd pain or sob. No chest pain. No hematemesis In the ED, pt was found to have heme pos stools. Gross melena was noted. Patient was given IV PPI. GI consulted.   #GI bleed with melena and heme positive stool: Evaluated by GI, the bleeding from the primary colon tumor.  As per GI, the upper GI endoscopy done 2 months ago was essentially normal.  The bleeding was likely in the setting of Eliquis.   -Eliquis discontinuous as per GI recommendation.  Repeat Doppler ultrasound negative for DVT.  Discussed with the patient and she agreed with the plan.  -Hemoglobin remained stable.  Recommend to follow-up with PCP and GI.  Tolerating diet well.  #Normocytic anemia likely multifactorial including acute blood loss, chronic disease: No sign of active bleed.  Hemoglobin stable.  #History of metastatic colon cancer: Followed by oncology.  #History of bilateral lower extremity DVT on therapeutic anticoagulation: The repeat Doppler ultrasound negative for DVTs.  Anticoagulation  discontinued because of GI bleed.  Recommend outpatient follow-up.  #Essential hypertension: Blood pressure on the lower side.    Discontinued valsartan/hydrochlorothiazide.  Recommended to monitor blood pressure daily at home and follow-up with PCP.  #Chronic kidney disease is stage III: Serum creatinine level around baseline.    Patient is clinically stable.  No bleeding.  Hemoglobin is stable.  Understand outpatient follow-up.  Discharge Diagnoses:  Principal Problem:   Gastrointestinal hemorrhage Active Problems:   Metastasis from colon cancer (HCC)   Acute deep vein thrombosis (DVT) of distal vein of both lower extremities (HCC)   Upper GI bleed    Discharge Instructions  Discharge Instructions    Call MD for:  difficulty breathing, headache or visual disturbances   Complete by:  As directed    Call MD for:  extreme fatigue   Complete by:  As directed    Call MD for:  hives   Complete by:  As directed    Call MD for:  persistant dizziness or light-headedness   Complete by:  As directed    Call MD for:  persistant nausea and vomiting   Complete by:  As directed    Call MD for:  severe uncontrolled pain   Complete by:  As directed    Call MD for:  temperature >100.4   Complete by:  As directed    Diet - low sodium heart healthy   Complete by:  As directed    Discharge instructions   Complete by:  As directed    Please check blood pressure daily.  To follow-up with your PCP, oncologist and GI.   Increase activity slowly   Complete by:  As directed      Allergies as  of 07/19/2017      Reactions   Penicillins Other (See Comments)   Has patient had a PCN reaction causing immediate rash, facial/tongue/throat swelling, SOB or lightheadedness with hypotension: unkn Has patient had a PCN reaction causing severe rash involving mucus membranes or skin necrosis: unkn Has patient had a PCN reaction that required hospitalization: unkn Has patient had a PCN reaction occurring  within the last 10 years: unkn If all of the above answers are "NO", then may proceed with Cephalosporin use.      Medication List    STOP taking these medications   apixaban 2.5 MG Tabs tablet Commonly known as:  ELIQUIS   valsartan-hydrochlorothiazide 320-12.5 MG tablet Commonly known as:  DIOVAN-HCT     TAKE these medications   acetaminophen 500 MG tablet Commonly known as:  TYLENOL Take 1,000 mg every 8 (eight) hours as needed by mouth for moderate pain.   allopurinol 100 MG tablet Commonly known as:  ZYLOPRIM Take 100 mg by mouth daily.   CARTIA XT 180 MG 24 hr capsule Generic drug:  diltiazem Take 180 mg by mouth daily.   CRESTOR 20 MG tablet Generic drug:  rosuvastatin Take 5 mg daily by mouth. Pt breaks meds into 1/4   HYDROcodone-acetaminophen 5-325 MG tablet Commonly known as:  NORCO Take 0.5-1 tablets every 6 (six) hours as needed by mouth for moderate pain.   levothyroxine 75 MCG tablet Commonly known as:  SYNTHROID, LEVOTHROID Take 75 mcg by mouth daily.   lidocaine-prilocaine cream Commonly known as:  EMLA Apply to port site one hour prior to use. Do not rub in. Cover with plastic.   loperamide 2 MG capsule Commonly known as:  IMODIUM Take 2 mg by mouth as needed for diarrhea or loose stools.   prednisoLONE acetate 1 % ophthalmic suspension Commonly known as:  PRED FORTE Place 1 drop into the left eye daily as needed.   prochlorperazine 5 MG tablet Commonly known as:  COMPAZINE Take 1 tablet (5 mg total) every 6 (six) hours as needed by mouth for nausea or vomiting.      Follow-up Information    [name], MD. Schedule an appointment as soon as possible for a visit in 1 week(s).   Specialty:  Internal Medicine Contact information: [address] [place] [address] [phone]        Milus Banister, MD. Schedule an appointment as soon as possible for a visit in 2 week(s).   Specialty:  Gastroenterology Contact  information: [address] [address] [phone]          Allergies  Allergen Reactions  . Penicillins Other (See Comments)    Has patient had a PCN reaction causing immediate rash, facial/tongue/throat swelling, SOB or lightheadedness with hypotension: unkn Has patient had a PCN reaction causing severe rash involving mucus membranes or skin necrosis: unkn Has patient had a PCN reaction that required hospitalization: unkn Has patient had a PCN reaction occurring within the last 10 years: unkn If all of the above answers are "NO", then may proceed with Cephalosporin use.     Consultations: GI  Procedures/Studies: Doppler ultrasound  Subjective: Seen and examined at bedside.  Doing well.  Denied nausea vomiting or abdominal pain.  No further bleed.  No chest pain or shortness of breath.  Discharge Exam: Vitals:   07/19/17 0548 07/19/17 1125  BP: 105/69 110/79  Pulse:    Resp: 18   Temp: 98.3 F (36.8 C)  SpO2: 99%    Vitals:   07/18/17 1433 07/18/17 2230 07/19/17 0548 07/19/17 1125  BP: (!) 86/52 108/71 105/69 110/79  Pulse: 87 (!) 106    Resp: 16 16 18    Temp: 98.4 F (36.9 C) 98.9 F (37.2 C) 98.3 F (36.8 C)   TempSrc: Oral Oral Oral   SpO2: 99% 99% 99%   Weight:      Height:        General: Pt is alert, awake, not in acute distress Cardiovascular: RRR, S1/S2 +, no rubs, no gallops Respiratory: CTA bilaterally, no wheezing, no rhonchi Abdominal: Soft, NT, ND, bowel sounds + Extremities: no edema, no cyanosis    The results of significant diagnostics from this hospitalization (including imaging, microbiology, ancillary and laboratory) are listed below for reference.     Microbiology: No results found for this or any previous visit (from the past 240 hour(s)).   Labs: BNP (last 3 results) No results for input(s): BNP in the last 8760 hours. Basic Metabolic Panel: Recent Labs  Lab 07/12/17 1249 07/17/17 1150 07/18/17 0533  07/19/17 0850  NA 137 136 137 136  K 3.9 4.1 4.0 4.1  CL  --  107 109 109  CO2 19* 20* 21* 21*  GLUCOSE 84 92 137* 97  BUN 47.4* 29* 28* 27*  CREATININE 3.8* 2.28* 2.42* 2.49*  CALCIUM 10.1 9.8 9.6 9.9   Liver Function Tests: Recent Labs  Lab 07/12/17 1249 07/17/17 1150 07/18/17 0533  AST 19 16 18   ALT 14 10* 9*  ALKPHOS 163* 128* 116  BILITOT 0.38 0.4 0.5  PROT 6.7 6.5 6.1*  ALBUMIN 2.6* 2.7* 2.5*   No results for input(s): LIPASE, AMYLASE in the last 168 hours. No results for input(s): AMMONIA in the last 168 hours. CBC: Recent Labs  Lab 07/12/17 1249 07/17/17 1150 07/18/17 0533 07/19/17 0850  WBC 4.5 3.7* 4.0 5.0  NEUTROABS 2.9 1.5*  --   --   HGB 7.3* 9.9* 9.1* 9.0*  HCT 22.9* 31.0* 28.5* 27.9*  MCV 83.6 84.9 86.1 85.1  PLT 311 427* 409* 379   Cardiac Enzymes: No results for input(s): CKTOTAL, CKMB, CKMBINDEX, TROPONINI in the last 168 hours. BNP: Invalid input(s): POCBNP CBG: No results for input(s): GLUCAP in the last 168 hours. D-Dimer No results for input(s): DDIMER in the last 72 hours. Hgb A1c No results for input(s): HGBA1C in the last 72 hours. Lipid Profile No results for input(s): CHOL, HDL, LDLCALC, TRIG, CHOLHDL, LDLDIRECT in the last 72 hours. Thyroid function studies No results for input(s): TSH, T4TOTAL, T3FREE, THYROIDAB in the last 72 hours.  Invalid input(s): FREET3 Anemia work up No results for input(s): VITAMINB12, FOLATE, FERRITIN, TIBC, IRON, RETICCTPCT in the last 72 hours. Urinalysis No results found for: COLORURINE, APPEARANCEUR, LABSPEC, [place], GLUCOSEU, HGBUR, BILIRUBINUR, KETONESUR, PROTEINUR, UROBILINOGEN, NITRITE, LEUKOCYTESUR Sepsis Labs Invalid input(s): PROCALCITONIN,  WBC,  LACTICIDVEN Microbiology No results found for this or any previous visit (from the past 240 hour(s)).   Time coordinating discharge: 27 minutes  SIGNED:   Rosita Fire, MD  [facility] 07/19/2017, 12:11 PM  If 7PM-7AM,  please contact night-coverage [email or web address] Password [email or web address]

## 2017-07-19 NOTE — Progress Notes (Signed)
Discharge instructions explained to pt, questions answered. Port flushed with 500 units of heparin. Son coming to take pt home. Discharge via wheelchair.

## 2017-07-23 ENCOUNTER — Encounter: Payer: Self-pay | Admitting: Nurse Practitioner

## 2017-07-23 ENCOUNTER — Telehealth: Payer: Self-pay | Admitting: Nurse Practitioner

## 2017-07-23 ENCOUNTER — Ambulatory Visit (HOSPITAL_BASED_OUTPATIENT_CLINIC_OR_DEPARTMENT_OTHER): Payer: BC Managed Care – PPO | Admitting: Nurse Practitioner

## 2017-07-23 ENCOUNTER — Ambulatory Visit: Payer: BC Managed Care – PPO

## 2017-07-23 ENCOUNTER — Other Ambulatory Visit (HOSPITAL_BASED_OUTPATIENT_CLINIC_OR_DEPARTMENT_OTHER): Payer: BC Managed Care – PPO

## 2017-07-23 VITALS — BP 114/74 | HR 104 | Temp 97.7°F | Resp 18 | Ht 63.0 in | Wt 175.5 lb

## 2017-07-23 DIAGNOSIS — C186 Malignant neoplasm of descending colon: Secondary | ICD-10-CM

## 2017-07-23 DIAGNOSIS — I82403 Acute embolism and thrombosis of unspecified deep veins of lower extremity, bilateral: Secondary | ICD-10-CM

## 2017-07-23 DIAGNOSIS — C786 Secondary malignant neoplasm of retroperitoneum and peritoneum: Secondary | ICD-10-CM

## 2017-07-23 DIAGNOSIS — C801 Malignant (primary) neoplasm, unspecified: Secondary | ICD-10-CM | POA: Diagnosis not present

## 2017-07-23 DIAGNOSIS — D6481 Anemia due to antineoplastic chemotherapy: Secondary | ICD-10-CM | POA: Diagnosis not present

## 2017-07-23 DIAGNOSIS — N289 Disorder of kidney and ureter, unspecified: Secondary | ICD-10-CM | POA: Diagnosis not present

## 2017-07-23 DIAGNOSIS — D500 Iron deficiency anemia secondary to blood loss (chronic): Secondary | ICD-10-CM | POA: Diagnosis not present

## 2017-07-23 DIAGNOSIS — C800 Disseminated malignant neoplasm, unspecified: Secondary | ICD-10-CM

## 2017-07-23 LAB — CBC WITH DIFFERENTIAL/PLATELET
BASO%: 0.6 % (ref 0.0–2.0)
BASOS ABS: 0.1 10*3/uL (ref 0.0–0.1)
EOS%: 0.4 % (ref 0.0–7.0)
Eosinophils Absolute: 0 10*3/uL (ref 0.0–0.5)
HEMATOCRIT: 31.5 % — AB (ref 34.8–46.6)
HGB: 10.2 g/dL — ABNORMAL LOW (ref 11.6–15.9)
LYMPH%: 12.6 % — AB (ref 14.0–49.7)
MCH: 27.4 pg (ref 25.1–34.0)
MCHC: 32.3 g/dL (ref 31.5–36.0)
MCV: 84.7 fL (ref 79.5–101.0)
MONO#: 0.8 10*3/uL (ref 0.1–0.9)
MONO%: 9.9 % (ref 0.0–14.0)
NEUT#: 6.5 10*3/uL (ref 1.5–6.5)
NEUT%: 76.5 % (ref 38.4–76.8)
PLATELETS: 390 10*3/uL (ref 145–400)
RBC: 3.72 10*6/uL (ref 3.70–5.45)
RDW: 18.5 % — ABNORMAL HIGH (ref 11.2–14.5)
WBC: 8.4 10*3/uL (ref 3.9–10.3)
lymph#: 1.1 10*3/uL (ref 0.9–3.3)

## 2017-07-23 LAB — TECHNOLOGIST REVIEW

## 2017-07-23 LAB — COMPREHENSIVE METABOLIC PANEL
ALT: 8 U/L (ref 0–55)
ANION GAP: 11 meq/L (ref 3–11)
AST: 15 U/L (ref 5–34)
Albumin: 2.7 g/dL — ABNORMAL LOW (ref 3.5–5.0)
Alkaline Phosphatase: 132 U/L (ref 40–150)
BUN: 22.2 mg/dL (ref 7.0–26.0)
CALCIUM: 10.2 mg/dL (ref 8.4–10.4)
CHLORIDE: 109 meq/L (ref 98–109)
CO2: 18 mEq/L — ABNORMAL LOW (ref 22–29)
CREATININE: 2.5 mg/dL — AB (ref 0.6–1.1)
EGFR: 21 mL/min/{1.73_m2} — AB (ref >60)
Glucose: 91 mg/dl (ref 70–140)
POTASSIUM: 4.1 meq/L (ref 3.5–5.1)
Sodium: 138 mEq/L (ref 136–145)
Total Bilirubin: 0.3 mg/dL (ref 0.20–1.20)
Total Protein: 7 g/dL (ref 6.4–8.3)

## 2017-07-23 MED ORDER — HEPARIN SOD (PORK) LOCK FLUSH 100 UNIT/ML IV SOLN
500.0000 [IU] | Freq: Once | INTRAVENOUS | Status: AC
  Administered 2017-07-23: 500 [IU]
  Filled 2017-07-23: qty 5

## 2017-07-23 MED ORDER — SODIUM CHLORIDE 0.9% FLUSH
10.0000 mL | Freq: Once | INTRAVENOUS | Status: AC
  Administered 2017-07-23: 10 mL
  Filled 2017-07-23: qty 10

## 2017-07-23 NOTE — Telephone Encounter (Signed)
Gave avs and calendar for December waiting 1/2

## 2017-07-23 NOTE — Progress Notes (Addendum)
[facility] OFFICE PROGRESS NOTE   Diagnosis: Unknown primary carcinoma  INTERVAL HISTORY:   Shawna Schneider returns for follow-up.  She completed cycle 1 FOLFOX 06/28/2017.  Cycle 2 was held 07/12/2017 due to concern for dehydration.  She received IV fluids.  She was transfused 2 units of blood on 07/13/2017.  She was seen in the emergency department 07/17/2017 with a GI bleed.  She was hospitalized and seen by Dr. Ardis Hughs.  The bleeding was felt to be from the primary colon tumor.  Repeat lower extremity bilateral venous Doppler studies were negative for DVT.  Eliquis has been discontinued.  She was discharged home 07/19/2017 at which time the hemoglobin was 9.0.  She has had no further bleeding.  She denies pain.  No nausea.  Objective:  Vital signs in last 24 hours:  Blood pressure 114/74, pulse (!) 104, temperature 97.7 F (36.5 C), temperature source Oral, resp. rate 18, height 5\' 3"  (1.6 m), weight 175 lb 8 oz (79.6 kg), SpO2 99 %.    HEENT: No thrush or ulcers. Resp: Lungs clear bilaterally. Cardio: Regular rate and rhythm. GI: Abdomen is soft with mild diffuse tenderness.  No hepatomegaly. Vascular: No leg edema. Port-A-Cath without erythema.  Lab Results:  Lab Results  Component Value Date   WBC 8.4 07/23/2017   HGB 10.2 (L) 07/23/2017   HCT 31.5 (L) 07/23/2017   MCV 84.7 07/23/2017   PLT 390 07/23/2017   NEUTROABS 6.5 07/23/2017    Imaging:  No results found.  Medications: I have reviewed the patient's current medications.  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 Findings consistent with a  gastrointestinal or pancreatico/biliary primary.  Cycle 1 FOLFOX 06/28/2017  Cycle 2 FOLFOX 07/24/2017  2. 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 GI bleeding after TPA 05/24/2017-resolved  4. Bilateral lower extremity DVTs confirmed on Doppler 05/25/2017--initially on Eliquis.  Eliquis discontinued due to a GI bleed December 2018.  Negative bilateral lower extremity Doppler 07/17/2017  5. Small PFO  6. Cervical cancer at age 65  7. Papillary renal cell carcinoma 2006, status post left nephrectomy  8.Renal insufficiency. Followed by Dr. Moshe Cipro.  9.Port-A-Cath placement 06/23/2017  10.Left hip/leg pain. Likely related to #1.  Resolved 07/12/2017.  11.  Hospitalization with GI bleed 07/17/2017 through 07/19/2017.  12.  Anemia secondary to GI blood loss, renal failure, chemotherapy.  Red cell transfusion 07/13/2017.    Disposition: Shawna Schneider appears stable.  She has completed 1 cycle of FOLFOX.  Plan to proceed with cycle 2 as scheduled 07/24/2017.  We will arrange for her to receive additional IV fluids on the day upon discontinuation, 07/26/2017.  She was recently hospitalized with a GI bleed.  She was seen by Dr. Ardis Hughs during the hospitalization.  Recommendation was made to discontinue anticoagulation.  She is felt to be at high risk for rebleeding if a blood thinner is resumed.  Dr. Benay Spice has also spoken with Dr. Ardis Hughs.  She will continue to hold anticoagulation.  She understands to contact the office with signs/symptoms of a blood clot.  If she were to develop a blood clot the plan would be to begin Coumadin or Lovenox.  She will return for labs, follow-up and cycle 3 FOLFOX on 08/11/2017.  She will contact the office in the interim as outlined above or with any other problems.  Patient seen with Dr. Benay Spice.  25 minutes were spent face-to-face at today's visit with the majority of that time involved in  counseling/coordination of care.    Ned Card ANP/GNP-BC   07/23/2017  4:39 PM This was insured visit with Ned Card.  Shawna Schneider appears unchanged.  She tolerated the first cycle of FOLFOX well.  She was admitted last weekend with dark stool, likely secondary to bleeding from the colon mass.  Eliquis was discontinued.  I discussed the case with Dr. Ardis Hughs.  He does not recommend anticoagulation therapy at present.  The plan is to begin anticoagulation with dose attenuated Coumadin or Lovenox if she develops recurrent venous thrombosis.  She will contact us for symptoms of venous thrombosis.  We discussed the risk of bleeding while on anticoagulation versus the risk of venous thromboembolic disease.  Shawna Schneider agrees to the current plan.  Shawna Manson, MD

## 2017-07-24 ENCOUNTER — Ambulatory Visit (HOSPITAL_BASED_OUTPATIENT_CLINIC_OR_DEPARTMENT_OTHER): Payer: BC Managed Care – PPO

## 2017-07-24 VITALS — BP 99/67 | HR 94 | Temp 97.8°F | Resp 18

## 2017-07-24 DIAGNOSIS — Z5111 Encounter for antineoplastic chemotherapy: Secondary | ICD-10-CM

## 2017-07-24 DIAGNOSIS — C800 Disseminated malignant neoplasm, unspecified: Secondary | ICD-10-CM

## 2017-07-24 DIAGNOSIS — C801 Malignant (primary) neoplasm, unspecified: Secondary | ICD-10-CM

## 2017-07-24 DIAGNOSIS — C786 Secondary malignant neoplasm of retroperitoneum and peritoneum: Secondary | ICD-10-CM | POA: Diagnosis not present

## 2017-07-24 MED ORDER — DEXTROSE 5 % IV SOLN
65.0000 mg/m2 | Freq: Once | INTRAVENOUS | Status: AC
  Administered 2017-07-24: 125 mg via INTRAVENOUS
  Filled 2017-07-24: qty 20

## 2017-07-24 MED ORDER — SODIUM CHLORIDE 0.9 % IV SOLN
2400.0000 mg/m2 | INTRAVENOUS | Status: DC
  Administered 2017-07-24: 4600 mg via INTRAVENOUS
  Filled 2017-07-24: qty 92

## 2017-07-24 MED ORDER — PALONOSETRON HCL INJECTION 0.25 MG/5ML
0.2500 mg | Freq: Once | INTRAVENOUS | Status: AC
  Administered 2017-07-24: 0.25 mg via INTRAVENOUS

## 2017-07-24 MED ORDER — DEXAMETHASONE SODIUM PHOSPHATE 10 MG/ML IJ SOLN
INTRAMUSCULAR | Status: AC
  Filled 2017-07-24: qty 1

## 2017-07-24 MED ORDER — PALONOSETRON HCL INJECTION 0.25 MG/5ML
INTRAVENOUS | Status: AC
  Filled 2017-07-24: qty 5

## 2017-07-24 MED ORDER — DEXAMETHASONE SODIUM PHOSPHATE 10 MG/ML IJ SOLN
10.0000 mg | Freq: Once | INTRAMUSCULAR | Status: AC
  Administered 2017-07-24: 10 mg via INTRAVENOUS

## 2017-07-24 MED ORDER — DEXTROSE 5 % IV SOLN
Freq: Once | INTRAVENOUS | Status: AC
  Administered 2017-07-24: 08:00:00 via INTRAVENOUS

## 2017-07-24 MED ORDER — LEUCOVORIN CALCIUM INJECTION 350 MG
400.0000 mg/m2 | Freq: Once | INTRAVENOUS | Status: AC
  Administered 2017-07-24: 768 mg via INTRAVENOUS
  Filled 2017-07-24: qty 25

## 2017-07-24 NOTE — Progress Notes (Signed)
Per Ned Card NP note 07/23/17 and Dr. Lindi Adie 07/24/17 okay to proceed with treatment with creatinine 2.5

## 2017-07-24 NOTE — Patient Instructions (Signed)
[facility] Discharge Instructions for Patients Receiving Chemotherapy  Today you received the following chemotherapy agents Oxaliplatin, Leucovorin, Adrucil  To help prevent nausea and vomiting after your treatment, we encourage you to take your nausea medication as directed   If you develop nausea and vomiting that is not controlled by your nausea medication, call the clinic.   BELOW ARE SYMPTOMS THAT SHOULD BE REPORTED IMMEDIATELY:  *FEVER GREATER THAN 100.5 F  *CHILLS WITH OR WITHOUT FEVER  NAUSEA AND VOMITING THAT IS NOT CONTROLLED WITH YOUR NAUSEA MEDICATION  *UNUSUAL SHORTNESS OF BREATH  *UNUSUAL BRUISING OR BLEEDING  TENDERNESS IN MOUTH AND THROAT WITH OR WITHOUT PRESENCE OF ULCERS  *URINARY PROBLEMS  *BOWEL PROBLEMS  UNUSUAL RASH Items with * indicate a potential emergency and should be followed up as soon as possible.  Feel free to call the clinic should you have any questions or concerns. The clinic phone number is [phone].  Please show the CHEMO ALERT CARD at check-in to the Emergency Department and triage nurse.   

## 2017-07-26 ENCOUNTER — Ambulatory Visit (HOSPITAL_BASED_OUTPATIENT_CLINIC_OR_DEPARTMENT_OTHER): Payer: BC Managed Care – PPO

## 2017-07-26 ENCOUNTER — Other Ambulatory Visit: Payer: BC Managed Care – PPO

## 2017-07-26 ENCOUNTER — Ambulatory Visit: Payer: BC Managed Care – PPO | Admitting: Oncology

## 2017-07-26 ENCOUNTER — Ambulatory Visit: Payer: BC Managed Care – PPO

## 2017-07-26 VITALS — BP 102/61 | HR 106 | Temp 98.4°F | Resp 18

## 2017-07-26 DIAGNOSIS — E860 Dehydration: Secondary | ICD-10-CM

## 2017-07-26 DIAGNOSIS — C801 Malignant (primary) neoplasm, unspecified: Secondary | ICD-10-CM

## 2017-07-26 DIAGNOSIS — C786 Secondary malignant neoplasm of retroperitoneum and peritoneum: Secondary | ICD-10-CM | POA: Diagnosis not present

## 2017-07-26 DIAGNOSIS — C186 Malignant neoplasm of descending colon: Secondary | ICD-10-CM

## 2017-07-26 DIAGNOSIS — C800 Disseminated malignant neoplasm, unspecified: Secondary | ICD-10-CM

## 2017-07-26 MED ORDER — HEPARIN SOD (PORK) LOCK FLUSH 100 UNIT/ML IV SOLN
500.0000 [IU] | Freq: Once | INTRAVENOUS | Status: AC
  Administered 2017-07-26: 500 [IU]
  Filled 2017-07-26: qty 5

## 2017-07-26 MED ORDER — SODIUM CHLORIDE 0.9% FLUSH
10.0000 mL | Freq: Once | INTRAVENOUS | Status: AC
  Administered 2017-07-26: 10 mL
  Filled 2017-07-26: qty 10

## 2017-07-26 MED ORDER — DIPHENHYDRAMINE HCL 25 MG PO CAPS
ORAL_CAPSULE | ORAL | Status: AC
  Filled 2017-07-26: qty 1

## 2017-07-26 MED ORDER — SODIUM CHLORIDE 0.9 % IV SOLN
INTRAVENOUS | Status: AC
  Administered 2017-07-26: 09:00:00 via INTRAVENOUS

## 2017-07-26 MED ORDER — ACETAMINOPHEN 325 MG PO TABS
ORAL_TABLET | ORAL | Status: AC
  Filled 2017-07-26: qty 2

## 2017-07-26 NOTE — Patient Instructions (Signed)
Dehydration, Adult Dehydration is when there is not enough fluid or water in your body. This happens when you lose more fluids than you take in. Dehydration can range from mild to very bad. It should be treated right away to keep it from getting very bad. Symptoms of mild dehydration may include:  Thirst.  Dry lips.  Slightly dry mouth.  Dry, warm skin.  Dizziness. Symptoms of moderate dehydration may include:  Very dry mouth.  Muscle cramps.  Dark pee (urine). Pee may be the color of tea.  Your body making less pee.  Your eyes making fewer tears.  Heartbeat that is uneven or faster than normal (palpitations).  Headache.  Light-headedness, especially when you stand up from sitting.  Fainting (syncope). Symptoms of very bad dehydration may include:  Changes in skin, such as: ? Cold and clammy skin. ? Blotchy (mottled) or pale skin. ? Skin that does not quickly return to normal after being lightly pinched and let go (poor skin turgor).  Changes in body fluids, such as: ? Feeling very thirsty. ? Your eyes making fewer tears. ? Not sweating when body temperature is high, such as in hot weather. ? Your body making very little pee.  Changes in vital signs, such as: ? Weak pulse. ? Pulse that is more than 100 beats a minute when you are sitting still. ? Fast breathing. ? Low blood pressure.  Other changes, such as: ? Sunken eyes. ? Cold hands and feet. ? Confusion. ? Lack of energy (lethargy). ? Trouble waking up from sleep. ? Short-term weight loss. ? Unconsciousness. Follow these instructions at home:  If told by your doctor, drink an ORS: ? Make an ORS by using instructions on the package. ? Start by drinking small amounts, about  cup (120 mL) every 5-10 minutes. ? Slowly drink more until you have had the amount that your doctor said to have.  Drink enough clear fluid to keep your pee clear or pale yellow. If you were told to drink an ORS, finish the ORS  first, then start slowly drinking clear fluids. Drink fluids such as: ? Water. Do not drink only water by itself. Doing that can make the salt (sodium) level in your body get too low (hyponatremia). ? Ice chips. ? Fruit juice that you have added water to (diluted). ? Low-calorie sports drinks.  Avoid: ? Alcohol. ? Drinks that have a lot of sugar. These include high-calorie sports drinks, fruit juice that does not have water added, and soda. ? Caffeine. ? Foods that are greasy or have a lot of fat or sugar.  Take over-the-counter and prescription medicines only as told by your doctor.  Do not take salt tablets. Doing that can make the salt level in your body get too high (hypernatremia).  Eat foods that have minerals (electrolytes). Examples include bananas, oranges, potatoes, tomatoes, and spinach.  Keep all follow-up visits as told by your doctor. This is important. Contact a doctor if:  You have belly (abdominal) pain that: ? Gets worse. ? Stays in one area (localizes).  You have a rash.  You have a stiff neck.  You get angry or annoyed more easily than normal (irritability).  You are more sleepy than normal.  You have a harder time waking up than normal.  You feel: ? Weak. ? Dizzy. ? Very thirsty.  You have peed (urinated) only a small amount of very dark pee during 6-8 hours. Get help right away if:  You have symptoms of   very bad dehydration.  You cannot drink fluids without throwing up (vomiting).  Your symptoms get worse with treatment.  You have a fever.  You have a very bad headache.  You are throwing up or having watery poop (diarrhea) and it: ? Gets worse. ? Does not go away.  You have blood or something green (bile) in your throw-up.  You have blood in your poop (stool). This may cause poop to look black and tarry.  You have not peed in 6-8 hours.  You pass out (faint).  Your heart rate when you are sitting still is more than 100 beats a  minute.  You have trouble breathing. This information is not intended to replace advice given to you by your health care provider. Make sure you discuss any questions you have with your health care provider. Document Released: 05/23/2009 Document Revised: 02/14/2016 Document Reviewed: 09/20/2015 Elsevier Interactive Patient Education  2018 [employer]  

## 2017-08-05 ENCOUNTER — Encounter: Payer: Self-pay | Admitting: Oncology

## 2017-08-05 ENCOUNTER — Telehealth: Payer: Self-pay

## 2017-08-05 NOTE — Telephone Encounter (Signed)
No answer, unable to leave voicemail. Email sent.

## 2017-08-08 ENCOUNTER — Other Ambulatory Visit: Payer: Self-pay | Admitting: Oncology

## 2017-08-11 ENCOUNTER — Other Ambulatory Visit: Payer: Self-pay | Admitting: *Deleted

## 2017-08-11 ENCOUNTER — Ambulatory Visit (HOSPITAL_COMMUNITY)
Admission: RE | Admit: 2017-08-11 | Discharge: 2017-08-11 | Disposition: A | Discharge status: Home / Self Care | Payer: BC Managed Care – PPO | Source: Ambulatory Visit | Attending: Oncology | Admitting: Oncology

## 2017-08-11 ENCOUNTER — Ambulatory Visit: Payer: BC Managed Care – PPO

## 2017-08-11 ENCOUNTER — Ambulatory Visit (HOSPITAL_BASED_OUTPATIENT_CLINIC_OR_DEPARTMENT_OTHER): Payer: BC Managed Care – PPO

## 2017-08-11 ENCOUNTER — Telehealth: Payer: Self-pay | Admitting: Oncology

## 2017-08-11 ENCOUNTER — Encounter (HOSPITAL_COMMUNITY)
Admission: RE | Admit: 2017-08-11 | Discharge: 2017-08-11 | Disposition: A | Discharge status: Home / Self Care | Payer: BC Managed Care – PPO | Source: Ambulatory Visit | Attending: Oncology | Admitting: Oncology

## 2017-08-11 ENCOUNTER — Other Ambulatory Visit: Payer: Self-pay

## 2017-08-11 ENCOUNTER — Encounter: Payer: Self-pay | Admitting: Oncology

## 2017-08-11 ENCOUNTER — Ambulatory Visit (HOSPITAL_BASED_OUTPATIENT_CLINIC_OR_DEPARTMENT_OTHER): Payer: BC Managed Care – PPO | Admitting: Oncology

## 2017-08-11 DIAGNOSIS — C801 Malignant (primary) neoplasm, unspecified: Secondary | ICD-10-CM

## 2017-08-11 DIAGNOSIS — R918 Other nonspecific abnormal finding of lung field: Secondary | ICD-10-CM | POA: Insufficient documentation

## 2017-08-11 DIAGNOSIS — I2699 Other pulmonary embolism without acute cor pulmonale: Secondary | ICD-10-CM | POA: Diagnosis not present

## 2017-08-11 DIAGNOSIS — D649 Anemia, unspecified: Secondary | ICD-10-CM

## 2017-08-11 DIAGNOSIS — C800 Disseminated malignant neoplasm, unspecified: Secondary | ICD-10-CM

## 2017-08-11 DIAGNOSIS — C186 Malignant neoplasm of descending colon: Secondary | ICD-10-CM

## 2017-08-11 DIAGNOSIS — C189 Malignant neoplasm of colon, unspecified: Secondary | ICD-10-CM | POA: Diagnosis not present

## 2017-08-11 DIAGNOSIS — D500 Iron deficiency anemia secondary to blood loss (chronic): Secondary | ICD-10-CM

## 2017-08-11 DIAGNOSIS — N289 Disorder of kidney and ureter, unspecified: Secondary | ICD-10-CM | POA: Diagnosis not present

## 2017-08-11 DIAGNOSIS — D6481 Anemia due to antineoplastic chemotherapy: Secondary | ICD-10-CM

## 2017-08-11 DIAGNOSIS — C786 Secondary malignant neoplasm of retroperitoneum and peritoneum: Secondary | ICD-10-CM

## 2017-08-11 DIAGNOSIS — J9811 Atelectasis: Secondary | ICD-10-CM | POA: Diagnosis not present

## 2017-08-11 DIAGNOSIS — C799 Secondary malignant neoplasm of unspecified site: Principal | ICD-10-CM

## 2017-08-11 DIAGNOSIS — I82403 Acute embolism and thrombosis of unspecified deep veins of lower extremity, bilateral: Secondary | ICD-10-CM | POA: Diagnosis not present

## 2017-08-11 DIAGNOSIS — I824Z3 Acute embolism and thrombosis of unspecified deep veins of distal lower extremity, bilateral: Secondary | ICD-10-CM

## 2017-08-11 LAB — CBC WITH DIFFERENTIAL/PLATELET
BASO%: 0.5 % (ref 0.0–2.0)
BASOS ABS: 0 10*3/uL (ref 0.0–0.1)
EOS%: 0.3 % (ref 0.0–7.0)
Eosinophils Absolute: 0 10*3/uL (ref 0.0–0.5)
HCT: 31.3 % — ABNORMAL LOW (ref 34.8–46.6)
HGB: 10.1 g/dL — ABNORMAL LOW (ref 11.6–15.9)
LYMPH%: 18 % (ref 14.0–49.7)
MCH: 27.3 pg (ref 25.1–34.0)
MCHC: 32.2 g/dL (ref 31.5–36.0)
MCV: 84.7 fL (ref 79.5–101.0)
MONO#: 0.9 10*3/uL (ref 0.1–0.9)
MONO%: 22.4 % — AB (ref 0.0–14.0)
NEUT#: 2.3 10*3/uL (ref 1.5–6.5)
NEUT%: 58.8 % (ref 38.4–76.8)
Platelets: 290 10*3/uL (ref 145–400)
RBC: 3.7 10*6/uL (ref 3.70–5.45)
RDW: 19 % — AB (ref 11.2–14.5)
WBC: 4 10*3/uL (ref 3.9–10.3)
lymph#: 0.7 10*3/uL — ABNORMAL LOW (ref 0.9–3.3)

## 2017-08-11 LAB — COMPREHENSIVE METABOLIC PANEL
ALT: 9 U/L (ref 0–55)
ANION GAP: 13 meq/L — AB (ref 3–11)
AST: 16 U/L (ref 5–34)
Albumin: 2.6 g/dL — ABNORMAL LOW (ref 3.5–5.0)
Alkaline Phosphatase: 131 U/L (ref 40–150)
BILIRUBIN TOTAL: 0.38 mg/dL (ref 0.20–1.20)
BUN: 23.5 mg/dL (ref 7.0–26.0)
CO2: 15 meq/L — AB (ref 22–29)
CREATININE: 2.7 mg/dL — AB (ref 0.6–1.1)
Calcium: 9.8 mg/dL (ref 8.4–10.4)
Chloride: 111 mEq/L — ABNORMAL HIGH (ref 98–109)
EGFR: 19 mL/min/{1.73_m2} — ABNORMAL LOW (ref >60)
GLUCOSE: 113 mg/dL (ref 70–140)
Potassium: 4 mEq/L (ref 3.5–5.1)
Sodium: 139 mEq/L (ref 136–145)
TOTAL PROTEIN: 6.6 g/dL (ref 6.4–8.3)

## 2017-08-11 MED ORDER — ENOXAPARIN SODIUM 80 MG/0.8ML ~~LOC~~ SOLN
80.0000 mg | Freq: Once | SUBCUTANEOUS | Status: AC
  Administered 2017-08-11: 80 mg via SUBCUTANEOUS

## 2017-08-11 MED ORDER — ENOXAPARIN SODIUM 80 MG/0.8ML ~~LOC~~ SOLN: 80.0000 mg | Freq: Every day | SUBCUTANEOUS | 0 refills | Status: DC

## 2017-08-11 MED ORDER — TECHNETIUM TO 99M ALBUMIN AGGREGATED
4.0000 | Freq: Once | INTRAVENOUS | Status: AC | PRN
  Administered 2017-08-11: 4 via INTRAVENOUS

## 2017-08-11 MED ORDER — TECHNETIUM TC 99M DIETHYLENETRIAME-PENTAACETIC ACID
30.0000 | Freq: Once | INTRAVENOUS | Status: AC | PRN
  Administered 2017-08-11: 30 via INTRAVENOUS

## 2017-08-11 NOTE — Progress Notes (Signed)
Opened in error

## 2017-08-11 NOTE — Progress Notes (Addendum)
Shawna Schneider OFFICE PROGRESS NOTE   Diagnosis: Colon cancer  INTERVAL HISTORY:   Shawna Schneider returns as scheduled.  She completed another cycle of FOLFOX 07/24/2017.  She reports nausea for the past few days.  No neuropathy symptoms or mouth sores.  She complains of a swollen right leg and dyspnea.  The symptoms have been present for 3-4 days.  She has 5-6 loose stools per day since beginning treatment.  She is drinking smoothies.  No bleeding.  Objective:  Vital signs in last 24 hours:  There were no vitals taken for this visit.    HEENT: No thrush or ulcers Resp: Lungs clear bilaterally, increased respiratory rate Cardio: Tachycardia, 2/6 systolic murmur GI: Mildly distended, no mass, no hepatomegaly Vascular: Edema throughout the right leg    Portacath/PICC-without erythema  Lab Results:  Lab Results  Component Value Date   WBC 4.0 08/11/2017   HGB 10.1 (L) 08/11/2017   HCT 31.3 (L) 08/11/2017   MCV 84.7 08/11/2017   PLT 290 08/11/2017   NEUTROABS 2.3 08/11/2017    CMP     Component Value Date/Time   NA 139 08/11/2017 0819   K 4.0 08/11/2017 0819   CL 109 07/19/2017 0850   CO2 15 (L) 08/11/2017 0819   GLUCOSE 113 08/11/2017 0819   BUN 23.5 08/11/2017 0819   CREATININE 2.7 (H) 08/11/2017 0819   CALCIUM 9.8 08/11/2017 0819   PROT 6.6 08/11/2017 0819   ALBUMIN 2.6 (L) 08/11/2017 0819   AST 16 08/11/2017 0819   ALT 9 08/11/2017 0819   ALKPHOS 131 08/11/2017 0819   BILITOT 0.38 08/11/2017 0819   GFRNONAA 17 (L) 07/19/2017 0850   GFRAA 20 (L) 07/19/2017 0850    Lab Results  Component Value Date   CEA1 55.80 (H) 06/28/2017    Lab Results  Component Value Date   INR 1.37 07/17/2017     Medications: I have reviewed the patient's current medications.   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 Findings consistent with a gastrointestinal or pancreatico/biliary primary.  Cycle 1 FOLFOX 06/28/2017  Cycle 2 FOLFOX 07/24/2017  2. 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 GI bleeding after TPA 05/24/2017-resolved  4. Bilateral lower extremity DVTs confirmed on Doppler 05/25/2017--initially on Eliquis.  Eliquis discontinued due to a GI bleed December 2018.  Negative bilateral lower extremity Doppler 07/17/2017  5. Small PFO  6. Cervical cancer at age 83  7. Papillary renal cell carcinoma 2006, status post left nephrectomy  8.Renal insufficiency. Followed by Dr. Moshe Cipro.  9.Port-A-Cath placement 06/23/2017  10.Left hip/leg pain. Likely related to #1.Resolved 07/12/2017.  11.  Hospitalization with GI bleed 07/17/2017 through 07/19/2017.  12.  Anemia secondary to GI blood loss, renal failure, chemotherapy.  Red cell transfusion 07/13/2017.   Disposition: Shawna Schneider has completed 2 cycles of FOLFOX.  She presents today with edema throughout the right leg and dyspnea.  I am concerned she has a pulmonary embolism.  She cannot have a chest CT secondary to renal insufficiency.  We will request a V/Q scan.  I reviewed options for anticoagulation with Shawna Schneider and her son.  We discussed the risk/benefit including bleeding risk.  I recommend Lovenox anticoagulation if she is confirmed to have a DVT.  She will return to the office after a Doppler today.  40 minutes were spent with the patient today.  The majority of the time was used for counseling and coordination of care.  Shawna Coder, Shawna Schneider  08/11/2017  9:16 AM  Addendum: A right lower extremity Doppler confirms an extensive deep vein thrombosis.  A  ventilation-perfusion scan is consistent with a right upper lobe pulmonary embolism.  I discussed these findings with Shawna Schneider and her son.  We discussed the risk of anticoagulation therapy.  She has chronic renal failure.  She agrees to a trial of dose reduced Lovenox.  She will contact us for bleeding.  She will return for an office visit with the plan to resume chemotherapy next week.

## 2017-08-11 NOTE — Progress Notes (Signed)
Entered chest x ray order

## 2017-08-11 NOTE — Addendum Note (Signed)
Addended by: Betsy Coder B on: 08/11/2017 10:21 AM   Modules accepted: Orders

## 2017-08-11 NOTE — Progress Notes (Signed)
Tx cancelled today per Dr. Learta Codding.

## 2017-08-11 NOTE — Progress Notes (Signed)
Follow up patient here for pre treatment check. She reports an acute swelling of her right foot and leg, denies pain in that extremity. She also reports feel a "knot in mid abdomen.

## 2017-08-11 NOTE — Telephone Encounter (Signed)
No 1/2 lost at check out

## 2017-08-11 NOTE — Progress Notes (Signed)
BLE venous duplex prelim: DVT noted in the Right common femoral, femoral, popliteal, posterior tibial, peroneal, and gastroc veins and in the Left posterior tibial veins.  Landry Mellow, RDMS, RVT Called results to [facility]

## 2017-08-13 ENCOUNTER — Telehealth: Payer: Self-pay | Admitting: Oncology

## 2017-08-13 NOTE — Telephone Encounter (Signed)
Scheduled appt per 1/2 los - Patient is aware of appt date and time.

## 2017-08-18 ENCOUNTER — Telehealth: Payer: Self-pay

## 2017-08-18 ENCOUNTER — Encounter: Payer: Self-pay | Admitting: Nurse Practitioner

## 2017-08-18 ENCOUNTER — Inpatient Hospital Stay: Payer: BC Managed Care – PPO | Attending: Nurse Practitioner

## 2017-08-18 ENCOUNTER — Inpatient Hospital Stay (HOSPITAL_BASED_OUTPATIENT_CLINIC_OR_DEPARTMENT_OTHER): Payer: BC Managed Care – PPO | Admitting: Nurse Practitioner

## 2017-08-18 ENCOUNTER — Inpatient Hospital Stay: Payer: BC Managed Care – PPO | Attending: Nutrition | Admitting: Nutrition

## 2017-08-18 ENCOUNTER — Inpatient Hospital Stay: Payer: BC Managed Care – PPO

## 2017-08-18 VITALS — BP 112/75 | HR 103 | Temp 98.3°F | Resp 18 | Ht 63.0 in | Wt 178.0 lb

## 2017-08-18 DIAGNOSIS — Z8541 Personal history of malignant neoplasm of cervix uteri: Secondary | ICD-10-CM | POA: Insufficient documentation

## 2017-08-18 DIAGNOSIS — C186 Malignant neoplasm of descending colon: Secondary | ICD-10-CM

## 2017-08-18 DIAGNOSIS — I2699 Other pulmonary embolism without acute cor pulmonale: Secondary | ICD-10-CM | POA: Insufficient documentation

## 2017-08-18 DIAGNOSIS — Z85528 Personal history of other malignant neoplasm of kidney: Secondary | ICD-10-CM | POA: Insufficient documentation

## 2017-08-18 DIAGNOSIS — Z7901 Long term (current) use of anticoagulants: Secondary | ICD-10-CM | POA: Insufficient documentation

## 2017-08-18 DIAGNOSIS — Z5111 Encounter for antineoplastic chemotherapy: Secondary | ICD-10-CM

## 2017-08-18 DIAGNOSIS — D649 Anemia, unspecified: Secondary | ICD-10-CM

## 2017-08-18 DIAGNOSIS — N2889 Other specified disorders of kidney and ureter: Secondary | ICD-10-CM | POA: Insufficient documentation

## 2017-08-18 DIAGNOSIS — Q211 Atrial septal defect: Secondary | ICD-10-CM | POA: Insufficient documentation

## 2017-08-18 DIAGNOSIS — I82401 Acute embolism and thrombosis of unspecified deep veins of right lower extremity: Secondary | ICD-10-CM | POA: Diagnosis not present

## 2017-08-18 DIAGNOSIS — C800 Disseminated malignant neoplasm, unspecified: Secondary | ICD-10-CM

## 2017-08-18 LAB — CBC WITH DIFFERENTIAL (CANCER CENTER ONLY)
Abs Granulocyte: 4.7 10*3/uL (ref 1.5–6.5)
Basophils Absolute: 0 10*3/uL (ref 0.0–0.1)
Basophils Relative: 0 %
EOS ABS: 0.1 10*3/uL (ref 0.0–0.5)
Eosinophils Relative: 1 %
HCT: 31.4 % — ABNORMAL LOW (ref 34.8–46.6)
HEMOGLOBIN: 9.9 g/dL — AB (ref 11.6–15.9)
LYMPHS ABS: 1.1 10*3/uL (ref 0.9–3.3)
Lymphocytes Relative: 17 %
MCH: 26.9 pg (ref 25.1–34.0)
MCHC: 31.5 g/dL (ref 31.5–36.0)
MCV: 85.3 fL (ref 79.5–101.0)
MONO ABS: 0.9 10*3/uL (ref 0.1–0.9)
MONOS PCT: 13 %
Neutro Abs: 4.7 10*3/uL (ref 1.5–6.5)
Neutrophils Relative %: 69 %
Platelet Count: 320 10*3/uL (ref 145–400)
RBC: 3.68 MIL/uL — AB (ref 3.70–5.45)
RDW: 19.2 % — AB (ref 11.2–16.1)
WBC: 6.8 10*3/uL (ref 4.0–10.3)

## 2017-08-18 LAB — CMP (CANCER CENTER ONLY)
ALBUMIN: 2.8 g/dL — AB (ref 3.5–5.0)
ALK PHOS: 108 U/L (ref 40–150)
ALT: 10 U/L (ref 0–55)
ANION GAP: 11 (ref 3–11)
AST: 18 U/L (ref 5–34)
BUN: 24 mg/dL (ref 7–26)
CALCIUM: 10.1 mg/dL (ref 8.4–10.4)
CO2: 19 mmol/L — AB (ref 22–29)
Chloride: 110 mmol/L — ABNORMAL HIGH (ref 98–109)
Creatinine: 2.42 mg/dL — ABNORMAL HIGH (ref 0.70–1.30)
GFR, EST AFRICAN AMERICAN: 21 mL/min — AB (ref >60)
GFR, Estimated: 18 mL/min — ABNORMAL LOW (ref >60)
GLUCOSE: 98 mg/dL (ref 70–140)
POTASSIUM: 4.1 mmol/L (ref 3.3–4.7)
SODIUM: 140 mmol/L (ref 136–145)
TOTAL PROTEIN: 7 g/dL (ref 6.4–8.3)
Total Bilirubin: 0.3 mg/dL (ref 0.2–1.2)

## 2017-08-18 LAB — CEA (IN HOUSE-CHCC): CEA (CHCC-In House): 39.54 ng/mL — ABNORMAL HIGH (ref 0.00–5.00)

## 2017-08-18 MED ORDER — SODIUM CHLORIDE 0.9% FLUSH
10.0000 mL | Freq: Once | INTRAVENOUS | Status: AC
  Administered 2017-08-18: 10 mL
  Filled 2017-08-18: qty 10

## 2017-08-18 MED ORDER — DEXAMETHASONE SODIUM PHOSPHATE 10 MG/ML IJ SOLN
10.0000 mg | Freq: Once | INTRAMUSCULAR | Status: AC
  Administered 2017-08-18: 10 mg via INTRAVENOUS

## 2017-08-18 MED ORDER — OXALIPLATIN CHEMO INJECTION 100 MG/20ML
65.0000 mg/m2 | Freq: Once | INTRAVENOUS | Status: AC
  Administered 2017-08-18: 125 mg via INTRAVENOUS
  Filled 2017-08-18: qty 5

## 2017-08-18 MED ORDER — ENOXAPARIN SODIUM 80 MG/0.8ML ~~LOC~~ SOLN: 80.0000 mg | SUBCUTANEOUS | 2 refills | Status: DC

## 2017-08-18 MED ORDER — PALONOSETRON HCL INJECTION 0.25 MG/5ML
INTRAVENOUS | Status: AC
  Filled 2017-08-18: qty 5

## 2017-08-18 MED ORDER — DEXAMETHASONE SODIUM PHOSPHATE 10 MG/ML IJ SOLN
INTRAMUSCULAR | Status: AC
  Filled 2017-08-18: qty 1

## 2017-08-18 MED ORDER — SODIUM CHLORIDE 0.9% FLUSH
10.0000 mL | INTRAVENOUS | Status: DC | PRN
  Filled 2017-08-18: qty 10

## 2017-08-18 MED ORDER — FLUOROURACIL CHEMO INJECTION 5 GM/100ML
2400.0000 mg/m2 | INTRAVENOUS | Status: DC
  Administered 2017-08-18: 4600 mg via INTRAVENOUS
  Filled 2017-08-18: qty 92

## 2017-08-18 MED ORDER — DEXTROSE 5 % IV SOLN
Freq: Once | INTRAVENOUS | Status: AC
  Administered 2017-08-18: 15:00:00 via INTRAVENOUS

## 2017-08-18 MED ORDER — LEUCOVORIN CALCIUM INJECTION 350 MG
400.0000 mg/m2 | Freq: Once | INTRAMUSCULAR | Status: AC
  Administered 2017-08-18: 768 mg via INTRAVENOUS
  Filled 2017-08-18: qty 25

## 2017-08-18 MED ORDER — HEPARIN SOD (PORK) LOCK FLUSH 100 UNIT/ML IV SOLN
500.0000 [IU] | Freq: Once | INTRAVENOUS | Status: DC | PRN
  Filled 2017-08-18: qty 5

## 2017-08-18 MED ORDER — PALONOSETRON HCL INJECTION 0.25 MG/5ML
0.2500 mg | Freq: Once | INTRAVENOUS | Status: AC
  Administered 2017-08-18: 0.25 mg via INTRAVENOUS

## 2017-08-18 NOTE — Progress Notes (Signed)
Dr. Benay Spice okay to tx with Ctn 2.42 and HR 103.

## 2017-08-18 NOTE — Telephone Encounter (Signed)
Spoke with pathology regarding [facility] testing for patient. Noted that initial specimen was insufficient and no other specimens have been sent. This RN questioned if there are any other specimens to be sent. Per pathology there was one additional specimen, "but Dr. Lyndon Code said it would be insufficient so it wasn't sent". MD made aware.

## 2017-08-18 NOTE — Progress Notes (Signed)
Nutrition follow-up completed with patient diagnosed with unknown primary cancer. Weight improved documented as 178 pounds January 9. Labs creatinine 2.42 and albumin 2.8. Patient reports she only consumes one meal a day. She is not consuming oral nutrition supplements.  Nutrition diagnosis: Inadequate oral intake continues.  Intervention: Patient educated to consume smaller meals and snacks throughout the day. Stressed the importance of weight maintenance. Encouraged patient to try some type of oral nutrition supplement for additional protein and calories. Questions answered.  Teach back method used.  Monitoring, evaluation, goals: Patient will tolerate adequate calories and protein to maintain weight.  Next visit: To be scheduled as needed.  **Disclaimer: This note was dictated with voice recognition software. Similar sounding words can inadvertently be transcribed and this note may contain transcription errors which may not have been corrected upon publication of note.**

## 2017-08-18 NOTE — Progress Notes (Addendum)
  [facility] OFFICE PROGRESS NOTE   Diagnosis: Colon cancer  INTERVAL HISTORY:   Ms. Gorczyca returns as scheduled.  She continues Lovenox.  She is not aware of any bleeding.  Leg swelling is better.  Dyspnea also better.  She denies chest pain.  No nausea or vomiting.  No mouth sores.  No change in baseline bowel habits.  She denies pain.  No neuropathy symptoms.  Objective:  Vital signs in last 24 hours:  Blood pressure 112/75, pulse (!) 103, temperature 98.3 F (36.8 C), temperature source Oral, resp. rate 18, height 5\' 3"  (1.6 m), weight 178 lb (80.7 kg), SpO2 98 %.    HEENT: No thrush or ulcers. Resp: Lungs clear bilaterally. Cardio: Regular rate and rhythm. GI: Abdomen is distended.  No hepatomegaly. Vascular: Edema throughout the right leg.  Port-A-Cath without erythema.  Lab Results:  Lab Results  Component Value Date   WBC 4.0 08/11/2017   HGB 10.1 (L) 08/11/2017   HCT 31.3 (L) 08/11/2017   MCV 84.7 08/11/2017   PLT 290 08/11/2017   NEUTROABS 2.3 08/11/2017    Imaging:  No results found.  Medications: I have reviewed the patient's current medications.  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 Findings consistent with a gastrointestinal or pancreatico/biliary primary.  Cycle 1 FOLFOX 06/28/2017  Cycle 2 FOLFOX 07/24/2017  2. 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 GI bleeding after TPA 05/24/2017-resolved  4. Bilateral lower extremity DVTs confirmed on Doppler 05/25/2017--initially onEliquis.Eliquis discontinued due to a GI bleed December  2018.  Negative bilateral lower extremity Doppler 07/17/2017  5. Small PFO  6. Cervical cancer at age 63  7. Papillary renal cell carcinoma 2006, status post left nephrectomy  8.Renal insufficiency. Followed by Dr. Moshe Cipro.  9.Port-A-Cath placement 06/23/2017  10.Left hip/leg pain. Likely related to #1.Resolved 07/12/2017.  11.Hospitalization with GI bleed 07/17/2017 through 07/19/2017.  12.Anemia secondary to GI blood loss, renal failure, chemotherapy. Red cell transfusion 07/13/2017.  13.  Right lower extremity DVT/right upper lobe pulmonary embolism 08/11/2017.  On Lovenox.    Disposition: Ms. Doucette appears stable.  She has completed 2 cycles of FOLFOX.  Plan to proceed with cycle 3 today as scheduled pending results from CBC and chemistry panel.  She will continue Lovenox indefinitely.  She understands to contact the office with bleeding.  A new prescription was sent to her pharmacy.  She will return for a follow-up visit and cycle 4 FOLFOX in 2 weeks.  She will contact the office in the interim as outlined above or with any other problems.  Patient seen with Dr. Benay Spice.    Ned Card ANP/GNP-BC   08/18/2017  12:34 PM  This was a shared visit with Ned Card.  Ms. Kusch appears to be tolerating the Lovenox well.  The plan is to proceed with cycle 3 FOLFOX today.  We discussed the indication for continuing anticoagulation therapy with Ms. Gillyard and her family.  She agrees to continue daily Lovenox. She will return for an office visit and chemotherapy in 2 weeks.  Julieanne Manson, MD

## 2017-08-19 ENCOUNTER — Telehealth: Payer: Self-pay | Admitting: Nurse Practitioner

## 2017-08-19 NOTE — Telephone Encounter (Signed)
Scheduled appt per 1/9 los - Patient is aware of appt added and will pick up new schedule next visit with pump stop.

## 2017-08-20 ENCOUNTER — Inpatient Hospital Stay: Payer: BC Managed Care – PPO

## 2017-08-20 VITALS — BP 112/72 | HR 99 | Temp 98.2°F | Resp 17

## 2017-08-20 DIAGNOSIS — C800 Disseminated malignant neoplasm, unspecified: Secondary | ICD-10-CM

## 2017-08-20 DIAGNOSIS — C186 Malignant neoplasm of descending colon: Secondary | ICD-10-CM | POA: Diagnosis not present

## 2017-08-20 MED ORDER — HEPARIN SOD (PORK) LOCK FLUSH 100 UNIT/ML IV SOLN
500.0000 [IU] | Freq: Once | INTRAVENOUS | Status: AC | PRN
  Administered 2017-08-20: 500 [IU]
  Filled 2017-08-20: qty 5

## 2017-08-20 MED ORDER — SODIUM CHLORIDE 0.9% FLUSH
10.0000 mL | INTRAVENOUS | Status: DC | PRN
  Administered 2017-08-20: 10 mL
  Filled 2017-08-20: qty 10

## 2017-08-26 ENCOUNTER — Encounter: Payer: Self-pay | Admitting: Oncology

## 2017-08-26 ENCOUNTER — Telehealth: Payer: Self-pay

## 2017-08-26 NOTE — Telephone Encounter (Signed)
Patient sent email reporting that she has not "had a bowel movement since my last chemo treatment on 1/9". Spoke with pt to assess. Per pt she has "jelly-like stuff coming out that is clear with a little pink". She hadn't taken anything yet. Per Ned Card, pt to take Miralax daily and use Sennakot-S, if needed. Pt voiced understanding.

## 2017-08-29 ENCOUNTER — Other Ambulatory Visit: Payer: Self-pay | Admitting: Oncology

## 2017-09-01 ENCOUNTER — Encounter: Payer: Self-pay | Admitting: Nurse Practitioner

## 2017-09-01 ENCOUNTER — Inpatient Hospital Stay: Payer: BC Managed Care – PPO

## 2017-09-01 ENCOUNTER — Telehealth: Payer: Self-pay | Admitting: Nurse Practitioner

## 2017-09-01 ENCOUNTER — Inpatient Hospital Stay (HOSPITAL_BASED_OUTPATIENT_CLINIC_OR_DEPARTMENT_OTHER): Payer: BC Managed Care – PPO | Admitting: Nurse Practitioner

## 2017-09-01 VITALS — BP 105/74 | HR 110 | Temp 97.0°F | Resp 14 | Wt 178.9 lb

## 2017-09-01 DIAGNOSIS — I82401 Acute embolism and thrombosis of unspecified deep veins of right lower extremity: Secondary | ICD-10-CM

## 2017-09-01 DIAGNOSIS — Z85528 Personal history of other malignant neoplasm of kidney: Secondary | ICD-10-CM

## 2017-09-01 DIAGNOSIS — Z8541 Personal history of malignant neoplasm of cervix uteri: Secondary | ICD-10-CM | POA: Diagnosis not present

## 2017-09-01 DIAGNOSIS — I2699 Other pulmonary embolism without acute cor pulmonale: Secondary | ICD-10-CM | POA: Diagnosis not present

## 2017-09-01 DIAGNOSIS — Z7901 Long term (current) use of anticoagulants: Secondary | ICD-10-CM | POA: Diagnosis not present

## 2017-09-01 DIAGNOSIS — C186 Malignant neoplasm of descending colon: Secondary | ICD-10-CM

## 2017-09-01 DIAGNOSIS — Q211 Atrial septal defect: Secondary | ICD-10-CM

## 2017-09-01 DIAGNOSIS — Z5111 Encounter for antineoplastic chemotherapy: Secondary | ICD-10-CM | POA: Diagnosis not present

## 2017-09-01 DIAGNOSIS — C800 Disseminated malignant neoplasm, unspecified: Secondary | ICD-10-CM

## 2017-09-01 DIAGNOSIS — D649 Anemia, unspecified: Secondary | ICD-10-CM

## 2017-09-01 DIAGNOSIS — N2889 Other specified disorders of kidney and ureter: Secondary | ICD-10-CM

## 2017-09-01 LAB — CMP (CANCER CENTER ONLY)
ALK PHOS: 99 U/L (ref 40–150)
ALT: 11 U/L (ref 0–55)
AST: 16 U/L (ref 5–34)
Albumin: 2.6 g/dL — ABNORMAL LOW (ref 3.5–5.0)
Anion gap: 9 (ref 3–11)
BILIRUBIN TOTAL: 0.3 mg/dL (ref 0.2–1.2)
BUN: 23 mg/dL (ref 7–26)
CO2: 20 mmol/L — ABNORMAL LOW (ref 22–29)
CREATININE: 2.17 mg/dL — AB (ref 0.60–1.10)
Calcium: 10.2 mg/dL (ref 8.4–10.4)
Chloride: 110 mmol/L — ABNORMAL HIGH (ref 98–109)
GFR, EST NON AFRICAN AMERICAN: 20 mL/min — AB (ref >60)
GFR, Est AFR Am: 24 mL/min — ABNORMAL LOW (ref >60)
Glucose, Bld: 93 mg/dL (ref 70–140)
Potassium: 4.1 mmol/L (ref 3.3–4.7)
Sodium: 139 mmol/L (ref 136–145)
TOTAL PROTEIN: 6.6 g/dL (ref 6.4–8.3)

## 2017-09-01 LAB — CBC WITH DIFFERENTIAL (CANCER CENTER ONLY)
BASOS ABS: 0 10*3/uL (ref 0.0–0.1)
Basophils Relative: 0 %
EOS ABS: 0 10*3/uL (ref 0.0–0.5)
EOS PCT: 1 %
HCT: 28.9 % — ABNORMAL LOW (ref 34.8–46.6)
Hemoglobin: 9.2 g/dL — ABNORMAL LOW (ref 11.6–15.9)
Lymphocytes Relative: 17 %
Lymphs Abs: 0.7 10*3/uL — ABNORMAL LOW (ref 0.9–3.3)
MCH: 27.2 pg (ref 25.1–34.0)
MCHC: 31.8 g/dL (ref 31.5–36.0)
MCV: 85.5 fL (ref 79.5–101.0)
MONO ABS: 0.4 10*3/uL (ref 0.1–0.9)
Monocytes Relative: 12 %
Neutro Abs: 2.6 10*3/uL (ref 1.5–6.5)
Neutrophils Relative %: 70 %
PLATELETS: 259 10*3/uL (ref 145–400)
RBC: 3.38 MIL/uL — AB (ref 3.70–5.45)
RDW: 19.8 % — AB (ref 11.2–16.1)
WBC: 3.7 10*3/uL — AB (ref 3.9–10.3)

## 2017-09-01 MED ORDER — SODIUM CHLORIDE 0.9% FLUSH
10.0000 mL | Freq: Once | INTRAVENOUS | Status: AC
  Administered 2017-09-01: 10 mL
  Filled 2017-09-01: qty 10

## 2017-09-01 MED ORDER — DEXTROSE 5 % IV SOLN
Freq: Once | INTRAVENOUS | Status: AC
  Administered 2017-09-01: 15:00:00 via INTRAVENOUS

## 2017-09-01 MED ORDER — PALONOSETRON HCL INJECTION 0.25 MG/5ML
0.2500 mg | Freq: Once | INTRAVENOUS | Status: AC
  Administered 2017-09-01: 0.25 mg via INTRAVENOUS

## 2017-09-01 MED ORDER — DEXAMETHASONE SODIUM PHOSPHATE 10 MG/ML IJ SOLN
10.0000 mg | Freq: Once | INTRAMUSCULAR | Status: AC
  Administered 2017-09-01: 10 mg via INTRAVENOUS

## 2017-09-01 MED ORDER — DEXTROSE 5 % IV SOLN
65.0000 mg/m2 | Freq: Once | INTRAVENOUS | Status: AC
  Administered 2017-09-01: 125 mg via INTRAVENOUS
  Filled 2017-09-01: qty 20

## 2017-09-01 MED ORDER — DEXAMETHASONE SODIUM PHOSPHATE 10 MG/ML IJ SOLN
INTRAMUSCULAR | Status: AC
  Filled 2017-09-01: qty 1

## 2017-09-01 MED ORDER — PALONOSETRON HCL INJECTION 0.25 MG/5ML
INTRAVENOUS | Status: AC
  Filled 2017-09-01: qty 5

## 2017-09-01 MED ORDER — SODIUM CHLORIDE 0.9 % IV SOLN
2400.0000 mg/m2 | INTRAVENOUS | Status: DC
  Administered 2017-09-01: 4600 mg via INTRAVENOUS
  Filled 2017-09-01: qty 92

## 2017-09-01 MED ORDER — LEUCOVORIN CALCIUM INJECTION 350 MG
400.0000 mg/m2 | Freq: Once | INTRAVENOUS | Status: AC
  Administered 2017-09-01: 768 mg via INTRAVENOUS
  Filled 2017-09-01: qty 38.4

## 2017-09-01 NOTE — Progress Notes (Signed)
  [facility] OFFICE PROGRESS NOTE   Diagnosis: Colon cancer  INTERVAL HISTORY:   Shawna Schneider returns as scheduled.  She completed cycle 3 FOLFOX 08/18/2017.  She denies nausea/vomiting.  No mouth sores.  No diarrhea.  She has intermittent constipation and is taking a laxative.  Cold sensitivity lasted 3 or 4 days.  No persistent neuropathy symptoms.  She denies any bleeding.  Right leg continues to be swollen.  Shortness of breath continues to be improved.  She has mild dyspnea on exertion.  Objective:  Vital signs in last 24 hours:  Blood pressure 105/74, pulse (!) 110, temperature (!) 97 F (36.1 C), temperature source Oral, resp. rate 14, weight 178 lb 14.4 oz (81.1 kg), SpO2 98 %.    HEENT: No thrush or ulcers. Resp: Lungs clear bilaterally. Cardio: Regular rate and rhythm.   GI: Abdomen is protuberant.  No hepatomegaly.  Question small upper abdominal hernia. Vascular: Edema throughout the right leg. Port-A-Cath without erythema.  Lab Results:  Lab Results  Component Value Date   WBC 3.7 (L) 09/01/2017   HGB 10.1 (L) 08/11/2017   HCT 28.9 (L) 09/01/2017   MCV 85.5 09/01/2017   PLT 259 09/01/2017   NEUTROABS 2.6 09/01/2017    Imaging:  No results found.  Medications: I have reviewed the patient's current medications.  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 Findings consistent with a gastrointestinal or pancreatico/biliary primary.  Cycle 1 FOLFOX 06/28/2017  Cycle 2 FOLFOX 07/24/2017  Cycle 3 FOLFOX 08/18/2017  Cycle 4 FOLFOX 09/01/2017  2. Acute left posterior insula/frontoparietal and cerebellar  CVAs 05/24/2017  3. GI bleeding after TPA 05/24/2017-resolved  4. Bilateral lower extremity DVTs confirmed on Doppler 05/25/2017--initially onEliquis.Eliquis discontinued due to a GI bleed December 2018.  Negative bilateral lower extremity Doppler 07/17/2017  5. Small PFO  6. Cervical cancer at age 83  7. Papillary renal cell carcinoma 2006, status post left nephrectomy  8.Renal insufficiency. Followed by Dr. Moshe Cipro.  9.Port-A-Cath placement 06/23/2017  10.Left hip/leg pain. Likely related to #1.Resolved 07/12/2017.  11.Hospitalization with GI bleed 07/17/2017 through 07/19/2017.  12.Anemia secondary to GI blood loss, renal failure, chemotherapy. Red cell transfusion 07/13/2017.  13.  Right lower extremity DVT/right upper lobe pulmonary embolism 08/11/2017.  On Lovenox.    Disposition: Shawna Schneider appears stable.  She has completed 3 cycles of FOLFOX.  Plan to proceed with cycle 4 today as scheduled.  The plan is for restaging CTs after she has completed 5 cycles of FOLFOX.  She will return for a follow-up visit in 2 weeks.  She will contact the office in the interim with any problems.    Ned Card ANP/GNP-BC   09/01/2017  1:00 PM

## 2017-09-01 NOTE — Progress Notes (Signed)
  Oncology Nurse Navigator Documentation  Navigator Location: [facility] (09/01/17 1407)   )Navigator Encounter Type: Treatment (09/01/17 1407)                       Treatment Phase: Active Tx (09/01/17 1407) Barriers/Navigation Needs: No barriers at this time;No Questions;No Needs (09/01/17 1407)   Interventions: Psycho-social support (09/01/17 1407)  Met with patient and son to offer support and encouragement. No barriers, needs or questions identified.          Acuity: Level 1 (09/01/17 1407)         Time Spent with Patient: 15 (09/01/17 1407)

## 2017-09-01 NOTE — Telephone Encounter (Signed)
Scheduled appt per 1/23 los - Patient to get an updated schedule next visit 

## 2017-09-01 NOTE — Progress Notes (Signed)
OK to treat with creatinine 2.17, per Dr. Benay Spice. Tiffany, Infusion RN made aware.

## 2017-09-01 NOTE — Patient Instructions (Signed)
[facility] Discharge Instructions for Patients Receiving Chemotherapy  Today you received the following chemotherapy agents Oxaliplatin, Leucovorin, 5FU.    To help prevent nausea and vomiting after your treatment, we encourage you to take your nausea medication as prescribed.   If you develop nausea and vomiting that is not controlled by your nausea medication, call the clinic.   BELOW ARE SYMPTOMS THAT SHOULD BE REPORTED IMMEDIATELY:  *FEVER GREATER THAN 100.5 F  *CHILLS WITH OR WITHOUT FEVER  NAUSEA AND VOMITING THAT IS NOT CONTROLLED WITH YOUR NAUSEA MEDICATION  *UNUSUAL SHORTNESS OF BREATH  *UNUSUAL BRUISING OR BLEEDING  TENDERNESS IN MOUTH AND THROAT WITH OR WITHOUT PRESENCE OF ULCERS  *URINARY PROBLEMS  *BOWEL PROBLEMS  UNUSUAL RASH Items with * indicate a potential emergency and should be followed up as soon as possible.  Feel free to call the clinic should you have any questions or concerns. The clinic phone number is [phone].  Please show the CHEMO ALERT CARD at check-in to the Emergency Department and triage nurse.   

## 2017-09-03 ENCOUNTER — Inpatient Hospital Stay: Payer: BC Managed Care – PPO

## 2017-09-03 VITALS — BP 120/77 | HR 88 | Temp 98.2°F | Resp 16

## 2017-09-03 DIAGNOSIS — C186 Malignant neoplasm of descending colon: Secondary | ICD-10-CM | POA: Diagnosis not present

## 2017-09-03 DIAGNOSIS — C800 Disseminated malignant neoplasm, unspecified: Secondary | ICD-10-CM

## 2017-09-03 MED ORDER — SODIUM CHLORIDE 0.9% FLUSH
10.0000 mL | INTRAVENOUS | Status: DC | PRN
  Administered 2017-09-03: 10 mL
  Filled 2017-09-03: qty 10

## 2017-09-03 MED ORDER — HEPARIN SOD (PORK) LOCK FLUSH 100 UNIT/ML IV SOLN
500.0000 [IU] | Freq: Once | INTRAVENOUS | Status: AC | PRN
  Administered 2017-09-03: 500 [IU]
  Filled 2017-09-03: qty 5

## 2017-09-03 NOTE — Patient Instructions (Signed)
Implanted Port Home Guide An implanted port is a type of central line that is placed under the skin. Central lines are used to provide IV access when treatment or nutrition needs to be given through a person's veins. Implanted ports are used for long-term IV access. An implanted port may be placed because:  You need IV medicine that would be irritating to the small veins in your hands or arms.  You need long-term IV medicines, such as antibiotics.  You need IV nutrition for a long period.  You need frequent blood draws for lab tests.  You need dialysis.  Implanted ports are usually placed in the chest area, but they can also be placed in the upper arm, the abdomen, or the leg. An implanted port has two main parts:  Reservoir. The reservoir is round and will appear as a small, raised area under your skin. The reservoir is the part where a needle is inserted to give medicines or draw blood.  Catheter. The catheter is a thin, flexible tube that extends from the reservoir. The catheter is placed into a large vein. Medicine that is inserted into the reservoir goes into the catheter and then into the vein.  How will I care for my incision site? Do not get the incision site wet. Bathe or shower as directed by your health care provider. How is my port accessed? Special steps must be taken to access the port:  Before the port is accessed, a numbing cream can be placed on the skin. This helps numb the skin over the port site.  Your health care provider uses a sterile technique to access the port. ? Your health care provider must put on a mask and sterile gloves. ? The skin over your port is cleaned carefully with an antiseptic and allowed to dry. ? The port is gently pinched between sterile gloves, and a needle is inserted into the port.  Only "non-coring" port needles should be used to access the port. Once the port is accessed, a blood return should be checked. This helps ensure that the port  is in the vein and is not clogged.  If your port needs to remain accessed for a constant infusion, a clear (transparent) bandage will be placed over the needle site. The bandage and needle will need to be changed every week, or as directed by your health care provider.  Keep the bandage covering the needle clean and dry. Do not get it wet. Follow your health care provider's instructions on how to take a shower or bath while the port is accessed.  If your port does not need to stay accessed, no bandage is needed over the port.  What is flushing? Flushing helps keep the port from getting clogged. Follow your health care provider's instructions on how and when to flush the port. Ports are usually flushed with saline solution or a medicine called heparin. The need for flushing will depend on how the port is used.  If the port is used for intermittent medicines or blood draws, the port will need to be flushed: ? After medicines have been given. ? After blood has been drawn. ? As part of routine maintenance.  If a constant infusion is running, the port may not need to be flushed.  How long will my port stay implanted? The port can stay in for as long as your health care provider thinks it is needed. When it is time for the port to come out, surgery will be   done to remove it. The procedure is similar to the one performed when the port was put in. When should I seek immediate medical care? When you have an implanted port, you should seek immediate medical care if:  You notice a bad smell coming from the incision site.  You have swelling, redness, or drainage at the incision site.  You have more swelling or pain at the port site or the surrounding area.  You have a fever that is not controlled with medicine.  This information is not intended to replace advice given to you by your health care provider. Make sure you discuss any questions you have with your health care provider. Document  Released: 07/27/2005 Document Revised: 01/02/2016 Document Reviewed: 04/03/2013 Elsevier Interactive Patient Education  2017 [employer]  

## 2017-09-06 ENCOUNTER — Encounter: Payer: Self-pay | Admitting: Oncology

## 2017-09-07 ENCOUNTER — Other Ambulatory Visit: Payer: Self-pay

## 2017-09-07 NOTE — Patient Outreach (Signed)
Telephone outreach to patient to obtain mRS was successfully completed. mRS = 2 

## 2017-09-12 ENCOUNTER — Other Ambulatory Visit: Payer: Self-pay | Admitting: Oncology

## 2017-09-15 ENCOUNTER — Inpatient Hospital Stay: Payer: BC Managed Care – PPO

## 2017-09-15 ENCOUNTER — Telehealth: Payer: Self-pay | Admitting: Oncology

## 2017-09-15 ENCOUNTER — Inpatient Hospital Stay (HOSPITAL_BASED_OUTPATIENT_CLINIC_OR_DEPARTMENT_OTHER): Payer: BC Managed Care – PPO | Admitting: Oncology

## 2017-09-15 ENCOUNTER — Inpatient Hospital Stay: Payer: BC Managed Care – PPO | Attending: Nurse Practitioner

## 2017-09-15 VITALS — BP 114/68 | HR 92 | Temp 97.9°F | Resp 18 | Ht 63.0 in | Wt 175.9 lb

## 2017-09-15 DIAGNOSIS — C186 Malignant neoplasm of descending colon: Secondary | ICD-10-CM | POA: Diagnosis present

## 2017-09-15 DIAGNOSIS — D6481 Anemia due to antineoplastic chemotherapy: Secondary | ICD-10-CM | POA: Diagnosis not present

## 2017-09-15 DIAGNOSIS — R188 Other ascites: Secondary | ICD-10-CM | POA: Diagnosis not present

## 2017-09-15 DIAGNOSIS — R6881 Early satiety: Secondary | ICD-10-CM | POA: Insufficient documentation

## 2017-09-15 DIAGNOSIS — C786 Secondary malignant neoplasm of retroperitoneum and peritoneum: Secondary | ICD-10-CM

## 2017-09-15 DIAGNOSIS — K5900 Constipation, unspecified: Secondary | ICD-10-CM | POA: Diagnosis not present

## 2017-09-15 DIAGNOSIS — Z5111 Encounter for antineoplastic chemotherapy: Secondary | ICD-10-CM

## 2017-09-15 DIAGNOSIS — D500 Iron deficiency anemia secondary to blood loss (chronic): Secondary | ICD-10-CM

## 2017-09-15 DIAGNOSIS — Z86711 Personal history of pulmonary embolism: Secondary | ICD-10-CM

## 2017-09-15 DIAGNOSIS — Z8541 Personal history of malignant neoplasm of cervix uteri: Secondary | ICD-10-CM | POA: Diagnosis not present

## 2017-09-15 DIAGNOSIS — C800 Disseminated malignant neoplasm, unspecified: Secondary | ICD-10-CM

## 2017-09-15 DIAGNOSIS — Z7901 Long term (current) use of anticoagulants: Secondary | ICD-10-CM | POA: Insufficient documentation

## 2017-09-15 DIAGNOSIS — Z85528 Personal history of other malignant neoplasm of kidney: Secondary | ICD-10-CM | POA: Insufficient documentation

## 2017-09-15 DIAGNOSIS — Z86718 Personal history of other venous thrombosis and embolism: Secondary | ICD-10-CM | POA: Diagnosis not present

## 2017-09-15 DIAGNOSIS — N2889 Other specified disorders of kidney and ureter: Secondary | ICD-10-CM | POA: Insufficient documentation

## 2017-09-15 DIAGNOSIS — Z8673 Personal history of transient ischemic attack (TIA), and cerebral infarction without residual deficits: Secondary | ICD-10-CM

## 2017-09-15 DIAGNOSIS — I110 Hypertensive heart disease with heart failure: Secondary | ICD-10-CM

## 2017-09-15 DIAGNOSIS — R918 Other nonspecific abnormal finding of lung field: Secondary | ICD-10-CM | POA: Diagnosis not present

## 2017-09-15 DIAGNOSIS — R110 Nausea: Secondary | ICD-10-CM | POA: Diagnosis not present

## 2017-09-15 DIAGNOSIS — Z8719 Personal history of other diseases of the digestive system: Secondary | ICD-10-CM | POA: Diagnosis not present

## 2017-09-15 DIAGNOSIS — G629 Polyneuropathy, unspecified: Secondary | ICD-10-CM | POA: Diagnosis not present

## 2017-09-15 DIAGNOSIS — R140 Abdominal distension (gaseous): Secondary | ICD-10-CM | POA: Insufficient documentation

## 2017-09-15 LAB — CBC WITH DIFFERENTIAL (CANCER CENTER ONLY)
BASOS PCT: 1 %
Basophils Absolute: 0 10*3/uL (ref 0.0–0.1)
EOS ABS: 0 10*3/uL (ref 0.0–0.5)
EOS PCT: 1 %
HCT: 27.5 % — ABNORMAL LOW (ref 34.8–46.6)
Hemoglobin: 8.8 g/dL — ABNORMAL LOW (ref 11.6–15.9)
LYMPHS ABS: 0.8 10*3/uL — AB (ref 0.9–3.3)
Lymphocytes Relative: 26 %
MCH: 27.4 pg (ref 25.1–34.0)
MCHC: 32 g/dL (ref 31.5–36.0)
MCV: 85.4 fL (ref 79.5–101.0)
MONOS PCT: 18 %
Monocytes Absolute: 0.6 10*3/uL (ref 0.1–0.9)
Neutro Abs: 1.7 10*3/uL (ref 1.5–6.5)
Neutrophils Relative %: 54 %
PLATELETS: 240 10*3/uL (ref 145–400)
RBC: 3.22 MIL/uL — ABNORMAL LOW (ref 3.70–5.45)
RDW: 22 % — AB (ref 11.2–14.5)
WBC Count: 3.1 10*3/uL — ABNORMAL LOW (ref 3.9–10.3)

## 2017-09-15 LAB — CMP (CANCER CENTER ONLY)
ALK PHOS: 88 U/L (ref 40–150)
ALT: 10 U/L (ref 0–55)
AST: 17 U/L (ref 5–34)
Albumin: 2.7 g/dL — ABNORMAL LOW (ref 3.5–5.0)
Anion gap: 12 — ABNORMAL HIGH (ref 3–11)
BUN: 34 mg/dL — AB (ref 7–26)
CALCIUM: 10 mg/dL (ref 8.4–10.4)
CHLORIDE: 110 mmol/L — AB (ref 98–109)
CO2: 17 mmol/L — AB (ref 22–29)
Creatinine: 1.82 mg/dL — ABNORMAL HIGH (ref 0.60–1.10)
GFR, EST NON AFRICAN AMERICAN: 25 mL/min — AB (ref >60)
GFR, Est AFR Am: 29 mL/min — ABNORMAL LOW (ref >60)
Glucose, Bld: 81 mg/dL (ref 70–140)
Potassium: 3.5 mmol/L (ref 3.5–5.1)
Sodium: 139 mmol/L (ref 136–145)
Total Bilirubin: 0.2 mg/dL (ref 0.2–1.2)
Total Protein: 6.7 g/dL (ref 6.4–8.3)

## 2017-09-15 LAB — CEA (IN HOUSE-CHCC): CEA (CHCC-In House): 23.99 ng/mL — ABNORMAL HIGH (ref 0.00–5.00)

## 2017-09-15 MED ORDER — OXALIPLATIN CHEMO INJECTION 100 MG/20ML
65.0000 mg/m2 | Freq: Once | INTRAVENOUS | Status: AC
  Administered 2017-09-15: 125 mg via INTRAVENOUS
  Filled 2017-09-15: qty 20

## 2017-09-15 MED ORDER — DEXAMETHASONE SODIUM PHOSPHATE 10 MG/ML IJ SOLN
INTRAMUSCULAR | Status: AC
  Filled 2017-09-15: qty 1

## 2017-09-15 MED ORDER — PALONOSETRON HCL INJECTION 0.25 MG/5ML
0.2500 mg | Freq: Once | INTRAVENOUS | Status: AC
  Administered 2017-09-15: 0.25 mg via INTRAVENOUS

## 2017-09-15 MED ORDER — LEUCOVORIN CALCIUM INJECTION 350 MG
400.0000 mg/m2 | Freq: Once | INTRAVENOUS | Status: AC
  Administered 2017-09-15: 768 mg via INTRAVENOUS
  Filled 2017-09-15: qty 38.4

## 2017-09-15 MED ORDER — SODIUM CHLORIDE 0.9% FLUSH
10.0000 mL | Freq: Once | INTRAVENOUS | Status: AC
  Administered 2017-09-15: 10 mL
  Filled 2017-09-15: qty 10

## 2017-09-15 MED ORDER — PALONOSETRON HCL INJECTION 0.25 MG/5ML
INTRAVENOUS | Status: AC
  Filled 2017-09-15: qty 5

## 2017-09-15 MED ORDER — METOCLOPRAMIDE HCL 5 MG PO TABS: 5.0000 mg | ORAL_TABLET | Freq: Three times a day (TID) | ORAL | 0 refills | Status: DC

## 2017-09-15 MED ORDER — DEXTROSE 5 % IV SOLN
Freq: Once | INTRAVENOUS | Status: AC
  Administered 2017-09-15: 12:00:00 via INTRAVENOUS

## 2017-09-15 MED ORDER — ONDANSETRON HCL 8 MG PO TABS: 8.0000 mg | ORAL_TABLET | Freq: Three times a day (TID) | ORAL | 1 refills | Status: DC | PRN

## 2017-09-15 MED ORDER — DEXAMETHASONE SODIUM PHOSPHATE 10 MG/ML IJ SOLN
10.0000 mg | Freq: Once | INTRAMUSCULAR | Status: AC
  Administered 2017-09-15: 10 mg via INTRAVENOUS

## 2017-09-15 MED ORDER — SODIUM CHLORIDE 0.9 % IV SOLN
2400.0000 mg/m2 | INTRAVENOUS | Status: DC
  Administered 2017-09-15: 4600 mg via INTRAVENOUS
  Filled 2017-09-15: qty 92

## 2017-09-15 NOTE — Patient Instructions (Signed)
[facility] Discharge Instructions for Patients Receiving Chemotherapy  Today you received the following chemotherapy agents Oxaliplatin, Leucovorin, 5FU.    To help prevent nausea and vomiting after your treatment, we encourage you to take your nausea medication as prescribed.   If you develop nausea and vomiting that is not controlled by your nausea medication, call the clinic.   BELOW ARE SYMPTOMS THAT SHOULD BE REPORTED IMMEDIATELY:  *FEVER GREATER THAN 100.5 F  *CHILLS WITH OR WITHOUT FEVER  NAUSEA AND VOMITING THAT IS NOT CONTROLLED WITH YOUR NAUSEA MEDICATION  *UNUSUAL SHORTNESS OF BREATH  *UNUSUAL BRUISING OR BLEEDING  TENDERNESS IN MOUTH AND THROAT WITH OR WITHOUT PRESENCE OF ULCERS  *URINARY PROBLEMS  *BOWEL PROBLEMS  UNUSUAL RASH Items with * indicate a potential emergency and should be followed up as soon as possible.  Feel free to call the clinic should you have any questions or concerns. The clinic phone number is [phone].  Please show the CHEMO ALERT CARD at check-in to the Emergency Department and triage nurse.   

## 2017-09-15 NOTE — Telephone Encounter (Signed)
F/u as scheduled per 2/6 los. - no additional appts scheduled.

## 2017-09-15 NOTE — Progress Notes (Signed)
[place] OFFICE PROGRESS NOTE   Diagnosis: Colon cancer  INTERVAL HISTORY:   Shawna Schneider returns as scheduled.  She completed another cycle of FOLFOX 09/01/2017.  No diarrhea, mouth sores, or neuropathy symptoms.  She reports nausea and early satiety.  She is having bowel movements when she takes Dulcolax.  She drinks 2 protein supplements daily.  No bleeding.  She continues Lovenox anticoagulation.  No dyspnea.  Objective:  Vital signs in last 24 hours:  Blood pressure 114/68, pulse 92, temperature 97.9 F (36.6 C), temperature source Oral, resp. rate 18, height 5\' 3"  (1.6 m), weight 175 lb 14.4 oz (79.8 kg), SpO2 99 %.    HEENT: No thrush or ulcers Resp: Lungs clear bilaterally Cardio: Regular rate and rhythm GI: Mildly distended, nontender, no mass, no apparent ascites Vascular: Edema throughout the right leg, support stocking in place Neuro: Moderate loss of vibratory sense at the fingertips bilaterally   Portacath/PICC-without erythema  Lab Results:  Lab Results  Component Value Date   WBC 3.1 (L) 09/15/2017   HGB 10.1 (L) 08/11/2017   HCT 27.5 (L) 09/15/2017   MCV 85.4 09/15/2017   PLT 240 09/15/2017   NEUTROABS 1.7 09/15/2017    CMP     Component Value Date/Time   NA 139 09/15/2017 0947   NA 139 08/11/2017 0819   K 3.5 09/15/2017 0947   K 4.0 08/11/2017 0819   CL 110 (H) 09/15/2017 0947   CO2 17 (L) 09/15/2017 0947   CO2 15 (L) 08/11/2017 0819   GLUCOSE 81 09/15/2017 0947   GLUCOSE 113 08/11/2017 0819   BUN 34 (H) 09/15/2017 0947   BUN 23.5 08/11/2017 0819   CREATININE 1.82 (H) 09/15/2017 0947   CREATININE 2.7 (H) 08/11/2017 0819   CALCIUM 10.0 09/15/2017 0947   CALCIUM 9.8 08/11/2017 0819   PROT 6.7 09/15/2017 0947   PROT 6.6 08/11/2017 0819   ALBUMIN 2.7 (L) 09/15/2017 0947   ALBUMIN 2.6 (L) 08/11/2017 0819   AST 17 09/15/2017 0947   AST 16 08/11/2017 0819   ALT 10 09/15/2017 0947   ALT 9 08/11/2017 0819   ALKPHOS 88  09/15/2017 0947   ALKPHOS 131 08/11/2017 0819   BILITOT 0.2 09/15/2017 0947   BILITOT 0.38 08/11/2017 0819   GFRNONAA 25 (L) 09/15/2017 0947   GFRAA 29 (L) 09/15/2017 0947    Lab Results  Component Value Date   CEA1 39.54 (H) 08/18/2017     Medications: I have reviewed the patient's current medications.   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 Findings consistent with a gastrointestinal or pancreatico/biliary primary.  Cycle 1 FOLFOX 06/28/2017  Cycle 2 FOLFOX 07/24/2017  Cycle 3 FOLFOX 08/18/2017  Cycle 4 FOLFOX 09/01/2017  Cycle 5 FOLFOX 09/15/2017  2. 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 GI bleeding after TPA 05/24/2017-resolved  4. Bilateral lower extremity DVTs confirmed on Doppler 05/25/2017--initially onEliquis.Eliquis discontinued due to a GI bleed December 2018.  Negative bilateral lower extremity Doppler 07/17/2017  5. Small PFO  6. Cervical cancer at age 54  7. Papillary renal cell carcinoma 2006, status post left nephrectomy  8.Renal insufficiency. Followed by Dr. Moshe Cipro.  9.Port-A-Cath placement 06/23/2017  10.Left hip/leg pain. Likely related to #1.Resolved 07/12/2017.  11.Hospitalization with GI  bleed 07/17/2017 through 07/19/2017.  12.Anemia secondary to GI blood loss, renal failure, chemotherapy. Red cell transfusion 07/13/2017.  13.Right lower extremity DVT/right upper lobe pulmonary embolism1/09/2017. On Lovenox.  14.  Oxaliplatin neuropathy-moderate loss of vibratory sense 09/15/2017    Disposition: Her overall  status appears unchanged.  The nausea and early satiety may be related to a degree of gastroparesis or ileus.  She will begin a trial of Reglan.  We reviewed the potential toxicities associated with Reglan including the chance of tardive dyskinesia.  She agrees to proceed. She will continue Lovenox anticoagulation.  Ms. Ballinas will complete cycle 5 FOLFOX today.  She will undergo restaging CTs prior to a return visit in 2 weeks.  25 minutes were spent with the patient today.  The majority of the time was used for counseling and coordination of care.  Betsy Coder, MD  09/15/2017  11:21 AM

## 2017-09-17 ENCOUNTER — Inpatient Hospital Stay: Payer: BC Managed Care – PPO

## 2017-09-17 DIAGNOSIS — C800 Disseminated malignant neoplasm, unspecified: Secondary | ICD-10-CM

## 2017-09-17 DIAGNOSIS — C186 Malignant neoplasm of descending colon: Secondary | ICD-10-CM | POA: Diagnosis not present

## 2017-09-17 MED ORDER — HEPARIN SOD (PORK) LOCK FLUSH 100 UNIT/ML IV SOLN
500.0000 [IU] | Freq: Once | INTRAVENOUS | Status: AC | PRN
  Administered 2017-09-17: 500 [IU]
  Filled 2017-09-17: qty 5

## 2017-09-17 MED ORDER — SODIUM CHLORIDE 0.9% FLUSH
10.0000 mL | INTRAVENOUS | Status: DC | PRN
  Administered 2017-09-17: 10 mL
  Filled 2017-09-17: qty 10

## 2017-09-22 ENCOUNTER — Telehealth: Payer: Self-pay | Admitting: Oncology

## 2017-09-22 NOTE — Telephone Encounter (Signed)
09/22/17 @ 9:24 am left vm for patient informing her that her Disability claim form is ready for pick up and will be located at the front desk reception.

## 2017-09-23 ENCOUNTER — Telehealth: Payer: Self-pay | Admitting: Medical Oncology

## 2017-09-23 NOTE — Telephone Encounter (Addendum)
I told pt she does NOT not need to drink barium  prior to her CT without contrast. She has one kidney .  I called pt back and told her "CT abd/pelvis without" means without CT dye ,so I told her she does need to drink the barium.

## 2017-09-26 ENCOUNTER — Other Ambulatory Visit: Payer: Self-pay | Admitting: Oncology

## 2017-09-27 ENCOUNTER — Ambulatory Visit (HOSPITAL_COMMUNITY)
Admission: RE | Admit: 2017-09-27 | Discharge: 2017-09-27 | Disposition: A | Discharge status: Home / Self Care | Payer: BC Managed Care – PPO | Source: Ambulatory Visit | Attending: Oncology | Admitting: Oncology

## 2017-09-27 ENCOUNTER — Encounter (HOSPITAL_COMMUNITY): Payer: Self-pay

## 2017-09-27 DIAGNOSIS — C186 Malignant neoplasm of descending colon: Secondary | ICD-10-CM | POA: Insufficient documentation

## 2017-09-27 DIAGNOSIS — R918 Other nonspecific abnormal finding of lung field: Secondary | ICD-10-CM | POA: Diagnosis not present

## 2017-09-27 DIAGNOSIS — K5730 Diverticulosis of large intestine without perforation or abscess without bleeding: Secondary | ICD-10-CM | POA: Diagnosis not present

## 2017-09-27 DIAGNOSIS — Z905 Acquired absence of kidney: Secondary | ICD-10-CM | POA: Insufficient documentation

## 2017-09-27 DIAGNOSIS — R188 Other ascites: Secondary | ICD-10-CM | POA: Diagnosis not present

## 2017-09-27 DIAGNOSIS — D3502 Benign neoplasm of left adrenal gland: Secondary | ICD-10-CM | POA: Diagnosis not present

## 2017-09-27 DIAGNOSIS — I700 Atherosclerosis of aorta: Secondary | ICD-10-CM | POA: Diagnosis not present

## 2017-09-27 DIAGNOSIS — R932 Abnormal findings on diagnostic imaging of liver and biliary tract: Secondary | ICD-10-CM | POA: Insufficient documentation

## 2017-09-29 ENCOUNTER — Inpatient Hospital Stay (HOSPITAL_BASED_OUTPATIENT_CLINIC_OR_DEPARTMENT_OTHER): Payer: BC Managed Care – PPO | Admitting: Oncology

## 2017-09-29 ENCOUNTER — Inpatient Hospital Stay: Payer: BC Managed Care – PPO

## 2017-09-29 ENCOUNTER — Telehealth: Payer: Self-pay

## 2017-09-29 VITALS — BP 132/87 | HR 88 | Temp 97.9°F | Resp 18 | Ht 63.0 in | Wt 172.1 lb

## 2017-09-29 DIAGNOSIS — C186 Malignant neoplasm of descending colon: Secondary | ICD-10-CM

## 2017-09-29 DIAGNOSIS — D500 Iron deficiency anemia secondary to blood loss (chronic): Secondary | ICD-10-CM | POA: Diagnosis not present

## 2017-09-29 DIAGNOSIS — Z5111 Encounter for antineoplastic chemotherapy: Secondary | ICD-10-CM

## 2017-09-29 DIAGNOSIS — R918 Other nonspecific abnormal finding of lung field: Secondary | ICD-10-CM

## 2017-09-29 DIAGNOSIS — C800 Disseminated malignant neoplasm, unspecified: Secondary | ICD-10-CM

## 2017-09-29 DIAGNOSIS — R140 Abdominal distension (gaseous): Secondary | ICD-10-CM | POA: Diagnosis not present

## 2017-09-29 DIAGNOSIS — N2889 Other specified disorders of kidney and ureter: Secondary | ICD-10-CM

## 2017-09-29 DIAGNOSIS — Z86711 Personal history of pulmonary embolism: Secondary | ICD-10-CM | POA: Diagnosis not present

## 2017-09-29 DIAGNOSIS — Z7901 Long term (current) use of anticoagulants: Secondary | ICD-10-CM | POA: Diagnosis not present

## 2017-09-29 DIAGNOSIS — D6481 Anemia due to antineoplastic chemotherapy: Secondary | ICD-10-CM | POA: Diagnosis not present

## 2017-09-29 DIAGNOSIS — G629 Polyneuropathy, unspecified: Secondary | ICD-10-CM

## 2017-09-29 DIAGNOSIS — Z8719 Personal history of other diseases of the digestive system: Secondary | ICD-10-CM

## 2017-09-29 DIAGNOSIS — K5900 Constipation, unspecified: Secondary | ICD-10-CM | POA: Diagnosis not present

## 2017-09-29 DIAGNOSIS — Z8673 Personal history of transient ischemic attack (TIA), and cerebral infarction without residual deficits: Secondary | ICD-10-CM

## 2017-09-29 DIAGNOSIS — Z86718 Personal history of other venous thrombosis and embolism: Secondary | ICD-10-CM

## 2017-09-29 DIAGNOSIS — C786 Secondary malignant neoplasm of retroperitoneum and peritoneum: Secondary | ICD-10-CM

## 2017-09-29 DIAGNOSIS — Z85528 Personal history of other malignant neoplasm of kidney: Secondary | ICD-10-CM

## 2017-09-29 DIAGNOSIS — Z8541 Personal history of malignant neoplasm of cervix uteri: Secondary | ICD-10-CM

## 2017-09-29 LAB — CMP (CANCER CENTER ONLY)
ALBUMIN: 2.6 g/dL — AB (ref 3.5–5.0)
ALT: 8 U/L (ref 0–55)
AST: 13 U/L (ref 5–34)
Alkaline Phosphatase: 84 U/L (ref 40–150)
Anion gap: 9 (ref 3–11)
BUN: 13 mg/dL (ref 7–26)
CHLORIDE: 113 mmol/L — AB (ref 98–109)
CO2: 19 mmol/L — AB (ref 22–29)
Calcium: 9.8 mg/dL (ref 8.4–10.4)
Creatinine: 1.76 mg/dL — ABNORMAL HIGH (ref 0.60–1.10)
GFR, EST NON AFRICAN AMERICAN: 26 mL/min — AB (ref >60)
GFR, Est AFR Am: 30 mL/min — ABNORMAL LOW (ref >60)
Glucose, Bld: 89 mg/dL (ref 70–140)
POTASSIUM: 3.3 mmol/L — AB (ref 3.5–5.1)
SODIUM: 141 mmol/L (ref 136–145)
Total Bilirubin: 0.3 mg/dL (ref 0.2–1.2)
Total Protein: 6.6 g/dL (ref 6.4–8.3)

## 2017-09-29 LAB — CBC WITH DIFFERENTIAL (CANCER CENTER ONLY)
BASOS ABS: 0 10*3/uL (ref 0.0–0.1)
BASOS PCT: 0 %
Eosinophils Absolute: 0.1 10*3/uL (ref 0.0–0.5)
Eosinophils Relative: 1 %
HCT: 27.4 % — ABNORMAL LOW (ref 34.8–46.6)
Hemoglobin: 8.7 g/dL — ABNORMAL LOW (ref 11.6–15.9)
Lymphocytes Relative: 31 %
Lymphs Abs: 1.1 10*3/uL (ref 0.9–3.3)
MCH: 27.2 pg (ref 25.1–34.0)
MCHC: 31.8 g/dL (ref 31.5–36.0)
MCV: 85.6 fL (ref 79.5–101.0)
MONO ABS: 0.6 10*3/uL (ref 0.1–0.9)
Monocytes Relative: 17 %
Neutro Abs: 1.9 10*3/uL (ref 1.5–6.5)
Neutrophils Relative %: 51 %
Platelet Count: 194 10*3/uL (ref 145–400)
RBC: 3.2 MIL/uL — AB (ref 3.70–5.45)
RDW: 21 % — AB (ref 11.2–14.5)
WBC: 3.7 10*3/uL — AB (ref 3.9–10.3)

## 2017-09-29 MED ORDER — SODIUM CHLORIDE 0.9% FLUSH
10.0000 mL | Freq: Once | INTRAVENOUS | Status: AC
  Administered 2017-09-29: 10 mL
  Filled 2017-09-29: qty 10

## 2017-09-29 MED ORDER — HEPARIN SOD (PORK) LOCK FLUSH 100 UNIT/ML IV SOLN
500.0000 [IU] | Freq: Once | INTRAVENOUS | Status: AC
  Administered 2017-09-29: 500 [IU]
  Filled 2017-09-29: qty 5

## 2017-09-29 NOTE — Progress Notes (Signed)
[place] OFFICE PROGRESS NOTE   Diagnosis: Colon cancer  INTERVAL HISTORY:   Ms. Shawna Schneider returns as scheduled.  She completed another cycle of FOLFOX 09/15/2017.  No nausea or mouth sores.  She has constipation following chemotherapy.  She has diarrhea at other times.  No bleeding.  She is bothered by abdominal distention.  Objective:  Vital signs in last 24 hours:  Blood pressure 132/87, pulse 88, temperature 97.9 F (36.6 C), temperature source Oral, resp. rate 18, height 5\' 3"  (1.6 m), weight 172 lb 1.6 oz (78.1 kg), SpO2 99 %.    HEENT: No thrush or ulcers Resp: Lungs clear bilaterally Cardio: Regular rate and rhythm GI: Distended, reducible upper ventral hernia, no mass Vascular: 1+ edema at the right lower leg  Skin: Palms without erythema  Portacath/PICC-without erythema  Lab Results:  Lab Results  Component Value Date   WBC 3.7 (L) 09/29/2017   HGB 10.1 (L) 08/11/2017   HCT 27.4 (L) 09/29/2017   MCV 85.6 09/29/2017   PLT 194 09/29/2017   NEUTROABS 1.9 09/29/2017    CMP     Component Value Date/Time   NA 141 09/29/2017 0836   NA 139 08/11/2017 0819   K 3.3 (L) 09/29/2017 0836   K 4.0 08/11/2017 0819   CL 113 (H) 09/29/2017 0836   CO2 19 (L) 09/29/2017 0836   CO2 15 (L) 08/11/2017 0819   GLUCOSE 89 09/29/2017 0836   GLUCOSE 113 08/11/2017 0819   BUN 13 09/29/2017 0836   BUN 23.5 08/11/2017 0819   CREATININE 1.76 (H) 09/29/2017 0836   CREATININE 2.7 (H) 08/11/2017 0819   CALCIUM 9.8 09/29/2017 0836   CALCIUM 9.8 08/11/2017 0819   PROT 6.6 09/29/2017 0836   PROT 6.6 08/11/2017 0819   ALBUMIN 2.6 (L) 09/29/2017 0836   ALBUMIN 2.6 (L) 08/11/2017 0819   AST 13 09/29/2017 0836   AST 16 08/11/2017 0819   ALT 8 09/29/2017 0836   ALT 9 08/11/2017 0819   ALKPHOS 84 09/29/2017 0836   ALKPHOS 131 08/11/2017 0819   BILITOT 0.3 09/29/2017 0836   BILITOT 0.38 08/11/2017 0819   GFRNONAA 26 (L) 09/29/2017 0836   GFRAA 30 (L) 09/29/2017 0836     Lab Results  Component Value Date   CEA1 23.99 (H) 09/15/2017     Imaging:  Ct Abdomen Pelvis Wo Contrast  Result Date: 09/27/2017 CLINICAL DATA:  Metastatic colon cancer restaging EXAM: CT CHEST, ABDOMEN AND PELVIS WITHOUT CONTRAST TECHNIQUE: Multidetector CT imaging of the chest, abdomen and pelvis was performed following the standard protocol without IV contrast. COMPARISON:  05/28/2017 FINDINGS: CT CHEST FINDINGS Cardiovascular: Right Port-A-Cath tip: Cavoatrial junction. Atherosclerotic calcification of the left anterior descending and circumflex coronary arteries, and of the aortic arch. Mediastinum/Nodes: Mild wall thickening in the distal esophagus, with contrast medium in the esophagus indicating dysmotility or reflux. Lungs/Pleura: Bilateral pulmonary nodules compatible with metastatic disease are present. An index lesion in the left upper lobe on image 46/6 measures 1.8 by 1.7 cm on image 46/6, formerly by my measurement the same. Some other nodules have mildly enlarged, for example the AP right apical nodule measures 0.8 by 0.6 cm, formerly 0.6 by 0.4 cm. Musculoskeletal: Thoracic spondylosis. CT ABDOMEN PELVIS FINDINGS Hepatobiliary: Irregular morphology of the dome of the liver. Capsular tumor deposition along the liver including a 1.9 by 1.5 cm lesion along the right hepatic lobe on image 50/2 which appears to be new or significantly enlarged compared to the prior exam. Hepatic  morphology suggests underlying cirrhosis. Pancreas: Unremarkable Spleen: Unremarkable Adrenals/Urinary Tract: Prior left nephrectomy. Low-density left adrenal gland suggesting adenoma. Multiple complex right renal lesions are probably complex cysts but technically nonspecific. Stomach/Bowel: A loculated collection of fluid and tumor in the pelvis flattens the distal sigmoid colon and rectum. There is diffuse colonic diverticulosis. No dilated small bowel identified. Vascular/Lymphatic: Aortoiliac atherosclerotic  vascular disease. Partially calcified left periaortic lymph node 1.0 cm in short axis on image 68/2, stable in size but the calcification appears to be new. The tumor deposit adjacent to the left iliac vessels measures 2.0 cm in short axis today, previously 1.5 cm, although this may be intra-abdominal rather than necessarily due to an adjacent lymph node. Reproductive: Poor visualization of the uterus and ovaries due to surrounding tumor Other: Massive omental caking of tumor, with marked ascites. Portions the site is are loculated, especially in the pelvis, causing mass effect on surrounding structures. The omentum is diffusely infiltrated by tumor. Musculoskeletal: Grade 1 anterolisthesis at L4-5 and L5-S1 with multilevel degenerative facet arthropathy and foraminal impingement bilaterally at L4-5. There is a ventral midline hernia in the upper abdomen containing ascites and probably tumor. IMPRESSION: 1. Intra-abdominal tumor nodularity throughout the omentum and peritoneal surfaces is increased. Loculated cystic tumor masses in the pelvis flattened displace the rectum. Markedly severe omental infiltration by tumor, significantly increased from prior. Increased ascites. 2. Pulmonary nodules are stable in some cases and increased in size and others. 3. Increasing capsular tumor deposition along the liver with irregular hepatic morphology. 4. Contrast medium in the distal esophagus with mild wall thickening, probably from reflux or dysmotility. 5. Other imaging findings of potential clinical significance: Aortic Atherosclerosis (ICD10-I70.0). Left nephrectomy. Left adrenal adenoma. Multiple complex right renal lesions, probably complex cysts but technically nonspecific. Mild bilateral foraminal impingement at L4-5. Ventral midline hernia containing ascites and possibly tumor. Diffuse colonic diverticulosis. Electronically Signed   By: Van Clines M.D.   On: 09/27/2017 17:25   Ct Chest Wo Contrast  Result  Date: 09/27/2017 CLINICAL DATA:  Metastatic colon cancer restaging EXAM: CT CHEST, ABDOMEN AND PELVIS WITHOUT CONTRAST TECHNIQUE: Multidetector CT imaging of the chest, abdomen and pelvis was performed following the standard protocol without IV contrast. COMPARISON:  05/28/2017 FINDINGS: CT CHEST FINDINGS Cardiovascular: Right Port-A-Cath tip: Cavoatrial junction. Atherosclerotic calcification of the left anterior descending and circumflex coronary arteries, and of the aortic arch. Mediastinum/Nodes: Mild wall thickening in the distal esophagus, with contrast medium in the esophagus indicating dysmotility or reflux. Lungs/Pleura: Bilateral pulmonary nodules compatible with metastatic disease are present. An index lesion in the left upper lobe on image 46/6 measures 1.8 by 1.7 cm on image 46/6, formerly by my measurement the same. Some other nodules have mildly enlarged, for example the AP right apical nodule measures 0.8 by 0.6 cm, formerly 0.6 by 0.4 cm. Musculoskeletal: Thoracic spondylosis. CT ABDOMEN PELVIS FINDINGS Hepatobiliary: Irregular morphology of the dome of the liver. Capsular tumor deposition along the liver including a 1.9 by 1.5 cm lesion along the right hepatic lobe on image 50/2 which appears to be new or significantly enlarged compared to the prior exam. Hepatic morphology suggests underlying cirrhosis. Pancreas: Unremarkable Spleen: Unremarkable Adrenals/Urinary Tract: Prior left nephrectomy. Low-density left adrenal gland suggesting adenoma. Multiple complex right renal lesions are probably complex cysts but technically nonspecific. Stomach/Bowel: A loculated collection of fluid and tumor in the pelvis flattens the distal sigmoid colon and rectum. There is diffuse colonic diverticulosis. No dilated small bowel identified. Vascular/Lymphatic: Aortoiliac atherosclerotic vascular  disease. Partially calcified left periaortic lymph node 1.0 cm in short axis on image 68/2, stable in size but the  calcification appears to be new. The tumor deposit adjacent to the left iliac vessels measures 2.0 cm in short axis today, previously 1.5 cm, although this may be intra-abdominal rather than necessarily due to an adjacent lymph node. Reproductive: Poor visualization of the uterus and ovaries due to surrounding tumor Other: Massive omental caking of tumor, with marked ascites. Portions the site is are loculated, especially in the pelvis, causing mass effect on surrounding structures. The omentum is diffusely infiltrated by tumor. Musculoskeletal: Grade 1 anterolisthesis at L4-5 and L5-S1 with multilevel degenerative facet arthropathy and foraminal impingement bilaterally at L4-5. There is a ventral midline hernia in the upper abdomen containing ascites and probably tumor. IMPRESSION: 1. Intra-abdominal tumor nodularity throughout the omentum and peritoneal surfaces is increased. Loculated cystic tumor masses in the pelvis flattened displace the rectum. Markedly severe omental infiltration by tumor, significantly increased from prior. Increased ascites. 2. Pulmonary nodules are stable in some cases and increased in size and others. 3. Increasing capsular tumor deposition along the liver with irregular hepatic morphology. 4. Contrast medium in the distal esophagus with mild wall thickening, probably from reflux or dysmotility. 5. Other imaging findings of potential clinical significance: Aortic Atherosclerosis (ICD10-I70.0). Left nephrectomy. Left adrenal adenoma. Multiple complex right renal lesions, probably complex cysts but technically nonspecific. Mild bilateral foraminal impingement at L4-5. Ventral midline hernia containing ascites and possibly tumor. Diffuse colonic diverticulosis. Electronically Signed   By: Van Clines M.D.   On: 09/27/2017 17:25    Medications: I have reviewed the patient's current medications.   Assessment/Plan: 1.Metastaticadenocarcinoma  Colonoscopy 05/27/2017-left colon  mass, biopsy confirmed a tubulovillous adenoma  CTs chest, abdomen, and pelvis 05/28/2017-ascites, carcinomatosis, lung nodules, fullness at the splenic flexure  Biopsy of omental mass 06/02/2017--mucinous adenocarcinoma;positive for cytokeratin 20, cytokeratin 7 and CDX-2; negative for PAX-8 and GATA-3. Findings consistent with a gastrointestinal or pancreatico/biliary primary.  Cycle 1 FOLFOX 06/28/2017  Cycle 2 FOLFOX 07/24/2017  Cycle 3 FOLFOX 08/18/2017  Cycle 4 FOLFOX 09/01/2017  Cycle 5 FOLFOX 09/15/2017  CTs 09/27/2017- increased ascites and omental nodularity, stable and increased pulmonary nodules, increased capsular liver deposit  2. 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 GI bleeding after TPA 05/24/2017-resolved  4. Bilateral lower extremity DVTs confirmed on Doppler 05/25/2017--initially onEliquis.Eliquis discontinued due to a GI bleed December 2018.  Negative bilateral lower extremity Doppler 07/17/2017  5. Small PFO  6. Cervical cancer at age 14  7. Papillary renal cell carcinoma 2006, status post left nephrectomy  8.Renal insufficiency. Followed by Dr. Moshe Cipro.  9.Port-A-Cath placement 06/23/2017  10.Left hip/leg pain. Likely related to #1.Resolved 07/12/2017.  11.Hospitalization with GI bleed 07/17/2017 through 07/19/2017.  12.Anemia secondary to GI blood loss, renal failure, chemotherapy. Red cell transfusion 07/13/2017.  13.Right lower extremity DVT/right upper lobe pulmonary embolism1/09/2017. On Lovenox.  14.  Oxaliplatin neuropathy-moderate loss of vibratory sense 09/15/2017   Disposition: Ms. Shawna Schneider has completed 5 cycles of FOLFOX.  She has tolerated the chemotherapy well.  The CEA was lower on 09/15/2017, but the restaging CTs reveal evidence of disease progression.  She is symptomatic with progressive ascites.  I discussed the CT findings and reviewed the images with Ms.  Cothran and her son.  We discussed treatment options including supportive care versus salvage systemic therapy.  She understands the chance a response with second line systemic therapy is lower than with the initial treatment.  She indicated she would like to proceed with a trial of systemic therapy.  I recommend FOLFIRI.  We reviewed the potential toxicities associated with FOLFIRI including the chance for hematologic toxicity, acute/delayed diarrhea, and alopecia.  She agrees to proceed.  We will arrange for another omental biopsy to obtain tissue for molecular testing.  We will add Panitumumab if she does not have a RAS mutation.  Chemotherapy will be held today.  She will return for cycle 1 FOLFIRI on 10/06/2017.  She will be scheduled for an office visit and cycle 2 10/20/2017.  The plan is to add Panitumumab with cycle 2 if she does not have a RAS mutation.  40 minutes were spent with the patient today.  The majority of the time was used for counseling and coordination of care.  Betsy Coder, MD  09/29/2017  9:53 AM

## 2017-09-29 NOTE — Progress Notes (Signed)
DISCONTINUE OFF PATHWAY REGIMEN - [Other Dx]   [id number] (q14d):   A cycle is every 14 days:     Oxaliplatin      Leucovorin      5-Fluorouracil      5-Fluorouracil   **Always confirm dose/schedule in your pharmacy ordering system**    REASON: Disease Progression PRIOR TREATMENT: FOLFOX (q14d) TREATMENT RESPONSE: Progressive Disease (PD)  START OFF PATHWAY REGIMEN - [Other Dx]   [id number]:FOLFIRI + Panitumumab q14d (**2 cycles per order sheet**):   A cycle is every 14 days:     Panitumumab      Irinotecan      Leucovorin      5-Fluorouracil      5-Fluorouracil   **Always confirm dose/schedule in your pharmacy ordering system**    Patient Characteristics: Intent of Therapy: Non-Curative / Palliative Intent, Discussed with Patient

## 2017-09-29 NOTE — Telephone Encounter (Signed)
Spoke with Varney Biles in pathology to order Foundation 1 on omental mass biopdy for pt. Per Varney Biles, "Foundation 1 was already requested and both tissue samples were insufficient". Will make Dr. Benay Spice aware.

## 2017-09-30 ENCOUNTER — Other Ambulatory Visit: Payer: Self-pay | Admitting: *Deleted

## 2017-09-30 ENCOUNTER — Telehealth: Payer: Self-pay | Admitting: Oncology

## 2017-09-30 DIAGNOSIS — C186 Malignant neoplasm of descending colon: Secondary | ICD-10-CM

## 2017-09-30 NOTE — Telephone Encounter (Signed)
Called patient to advise her  That she has  Been added to infusion schedule for 2/28 instead of 2/27 due to rooms available.

## 2017-10-03 ENCOUNTER — Other Ambulatory Visit: Payer: Self-pay | Admitting: Oncology

## 2017-10-05 ENCOUNTER — Other Ambulatory Visit: Payer: Self-pay | Admitting: Radiology

## 2017-10-06 ENCOUNTER — Other Ambulatory Visit: Payer: BC Managed Care – PPO

## 2017-10-06 ENCOUNTER — Encounter (HOSPITAL_COMMUNITY): Payer: Self-pay

## 2017-10-06 ENCOUNTER — Other Ambulatory Visit: Payer: Self-pay | Admitting: Oncology

## 2017-10-06 ENCOUNTER — Ambulatory Visit (HOSPITAL_COMMUNITY)
Admission: RE | Admit: 2017-10-06 | Discharge: 2017-10-06 | Disposition: A | Discharge status: Home / Self Care | Payer: BC Managed Care – PPO | Source: Ambulatory Visit | Attending: Oncology | Admitting: Oncology

## 2017-10-06 DIAGNOSIS — Z8673 Personal history of transient ischemic attack (TIA), and cerebral infarction without residual deficits: Secondary | ICD-10-CM | POA: Diagnosis not present

## 2017-10-06 DIAGNOSIS — D3502 Benign neoplasm of left adrenal gland: Secondary | ICD-10-CM | POA: Insufficient documentation

## 2017-10-06 DIAGNOSIS — D490 Neoplasm of unspecified behavior of digestive system: Secondary | ICD-10-CM | POA: Diagnosis not present

## 2017-10-06 DIAGNOSIS — Z86718 Personal history of other venous thrombosis and embolism: Secondary | ICD-10-CM | POA: Insufficient documentation

## 2017-10-06 DIAGNOSIS — Z85038 Personal history of other malignant neoplasm of large intestine: Secondary | ICD-10-CM | POA: Diagnosis not present

## 2017-10-06 DIAGNOSIS — R590 Localized enlarged lymph nodes: Secondary | ICD-10-CM | POA: Diagnosis not present

## 2017-10-06 DIAGNOSIS — Z79899 Other long term (current) drug therapy: Secondary | ICD-10-CM | POA: Diagnosis not present

## 2017-10-06 DIAGNOSIS — Z880 Allergy status to penicillin: Secondary | ICD-10-CM | POA: Insufficient documentation

## 2017-10-06 DIAGNOSIS — I10 Essential (primary) hypertension: Secondary | ICD-10-CM | POA: Diagnosis not present

## 2017-10-06 DIAGNOSIS — C186 Malignant neoplasm of descending colon: Secondary | ICD-10-CM

## 2017-10-06 DIAGNOSIS — R188 Other ascites: Secondary | ICD-10-CM | POA: Diagnosis present

## 2017-10-06 DIAGNOSIS — E785 Hyperlipidemia, unspecified: Secondary | ICD-10-CM | POA: Insufficient documentation

## 2017-10-06 DIAGNOSIS — R918 Other nonspecific abnormal finding of lung field: Secondary | ICD-10-CM | POA: Diagnosis not present

## 2017-10-06 DIAGNOSIS — R1900 Intra-abdominal and pelvic swelling, mass and lump, unspecified site: Secondary | ICD-10-CM | POA: Diagnosis not present

## 2017-10-06 DIAGNOSIS — C7989 Secondary malignant neoplasm of other specified sites: Secondary | ICD-10-CM | POA: Insufficient documentation

## 2017-10-06 DIAGNOSIS — Z905 Acquired absence of kidney: Secondary | ICD-10-CM | POA: Diagnosis not present

## 2017-10-06 HISTORY — DX: Malignant (primary) neoplasm, unspecified: C80.1

## 2017-10-06 HISTORY — DX: Essential (primary) hypertension: I10

## 2017-10-06 LAB — CBC
HEMATOCRIT: 25.6 % — AB (ref 36.0–46.0)
HEMOGLOBIN: 8.2 g/dL — AB (ref 12.0–15.0)
MCH: 27.4 pg (ref 26.0–34.0)
MCHC: 32 g/dL (ref 30.0–36.0)
MCV: 85.6 fL (ref 78.0–100.0)
Platelets: 325 10*3/uL (ref 150–400)
RBC: 2.99 MIL/uL — ABNORMAL LOW (ref 3.87–5.11)
RDW: 20.6 % — AB (ref 11.5–15.5)
WBC: 4.4 10*3/uL (ref 4.0–10.5)

## 2017-10-06 LAB — PROTIME-INR
INR: 1.05
PROTHROMBIN TIME: 13.6 s (ref 11.4–15.2)

## 2017-10-06 MED ORDER — LIDOCAINE HCL (PF) 2 % IJ SOLN
INTRAMUSCULAR | Status: DC | PRN
  Administered 2017-10-06: 10 mL

## 2017-10-06 MED ORDER — SODIUM CHLORIDE 0.9% FLUSH
10.0000 mL | INTRAVENOUS | Status: DC | PRN
  Administered 2017-10-06: 10 mL
  Filled 2017-10-06: qty 40

## 2017-10-06 MED ORDER — LIDOCAINE HCL 1 % IJ SOLN
INTRAMUSCULAR | Status: AC
  Filled 2017-10-06: qty 20

## 2017-10-06 MED ORDER — SODIUM CHLORIDE 0.9 % IV SOLN
INTRAVENOUS | Status: DC
  Administered 2017-10-06: 08:00:00 via INTRAVENOUS

## 2017-10-06 MED ORDER — FENTANYL CITRATE (PF) 100 MCG/2ML IJ SOLN
INTRAMUSCULAR | Status: AC
  Filled 2017-10-06: qty 4

## 2017-10-06 MED ORDER — FENTANYL CITRATE (PF) 100 MCG/2ML IJ SOLN
INTRAMUSCULAR | Status: AC | PRN
  Administered 2017-10-06: 50 ug via INTRAVENOUS

## 2017-10-06 MED ORDER — LIDOCAINE 2% (20 MG/ML) 5 ML SYRINGE
INTRAMUSCULAR | Status: AC
  Filled 2017-10-06: qty 10

## 2017-10-06 MED ORDER — MIDAZOLAM HCL 2 MG/2ML IJ SOLN
INTRAMUSCULAR | Status: AC
  Filled 2017-10-06: qty 4

## 2017-10-06 MED ORDER — GELATIN ABSORBABLE 12-7 MM EX MISC
CUTANEOUS | Status: AC
  Filled 2017-10-06: qty 1

## 2017-10-06 MED ORDER — MIDAZOLAM HCL 2 MG/2ML IJ SOLN
INTRAMUSCULAR | Status: AC | PRN
  Administered 2017-10-06 (×2): 1 mg via INTRAVENOUS

## 2017-10-06 MED ORDER — HEPARIN SOD (PORK) LOCK FLUSH 100 UNIT/ML IV SOLN
500.0000 [IU] | INTRAVENOUS | Status: AC | PRN
  Administered 2017-10-06: 500 [IU]

## 2017-10-06 MED ORDER — SODIUM CHLORIDE 0.9 % IV SOLN
INTRAVENOUS | Status: AC | PRN
  Administered 2017-10-06: 10 mL/h via INTRAVENOUS

## 2017-10-06 MED ORDER — LIDOCAINE 2% (20 MG/ML) 5 ML SYRINGE
INTRAMUSCULAR | Status: AC
  Filled 2017-10-06: qty 5

## 2017-10-06 NOTE — Discharge Instructions (Addendum)
Needle Biopsy, Care After °Refer to this sheet in the next few weeks. These instructions provide you with information about caring for yourself after your procedure. Your health care provider may also give you more specific instructions. Your treatment has been planned according to current medical practices, but problems sometimes occur. Call your health care provider if you have any problems or questions after your procedure. °What can I expect after the procedure? °After your procedure, it is common to have soreness, bruising, or mild pain at the biopsy site. This should go away in a few days. °Follow these instructions at home: °· Rest as directed by your health care provider. °· Take medicines only as directed by your health care provider. °· There are many different ways to close and cover the biopsy site, including stitches (sutures), skin glue, and adhesive strips. Follow your health care provider's instructions about: °? Biopsy site care. °? Bandage (dressing) changes and removal. °? Biopsy site closure removal. °· Check your biopsy site every day for signs of infection. Watch for: °? Redness, swelling, or pain. °? Fluid, blood, or pus. °Contact a health care provider if: °· You have a fever. °· You have redness, swelling, or pain at the biopsy site that lasts longer than a few days. °· You have fluid, blood, or pus coming from the biopsy site. °· You feel nauseous. °· You vomit. °Get help right away if: °· You have shortness of breath. °· You have trouble breathing. °· You have chest pain. °· You feel dizzy or you faint. °· You have bleeding that does not stop with pressure or a bandage. °· You cough up blood. °· You have pain in your abdomen. °This information is not intended to replace advice given to you by your health care provider. Make sure you discuss any questions you have with your health care provider. °Document Released: 12/11/2014 Document Revised: 01/02/2016 Document Reviewed:  07/23/2014 °Elsevier Interactive Patient Education © 2018 [employer] °Moderate Conscious Sedation, Adult, Care After °These instructions provide you with information about caring for yourself after your procedure. Your health care provider may also give you more specific instructions. Your treatment has been planned according to current medical practices, but problems sometimes occur. Call your health care provider if you have any problems or questions after your procedure. °What can I expect after the procedure? °After your procedure, it is common: °· To feel sleepy for several hours. °· To feel clumsy and have poor balance for several hours. °· To have poor judgment for several hours. °· To vomit if you eat too soon. ° °Follow these instructions at home: °For at least 24 hours after the procedure: ° °· Do not: °? Participate in activities where you could fall or become injured. °? Drive. °? Use heavy machinery. °? Drink alcohol. °? Take sleeping pills or medicines that cause drowsiness. °? Make important decisions or sign legal documents. °? Take care of children on your own. °· Rest. °Eating and drinking °· Follow the diet recommended by your health care provider. °· If you vomit: °? Drink water, juice, or soup when you can drink without vomiting. °? Make sure you have little or no nausea before eating solid foods. °General instructions °· Have a responsible adult stay with you until you are awake and alert. °· Take over-the-counter and prescription medicines only as told by your health care provider. °· If you smoke, do not smoke without supervision. °· Keep all follow-up visits as told by your health care   provider. This is important. °Contact a health care provider if: °· You keep feeling nauseous or you keep vomiting. °· You feel light-headed. °· You develop a rash. °· You have a fever. °Get help right away if: °· You have trouble breathing. °This information is not intended to replace advice given to you  by your health care provider. Make sure you discuss any questions you have with your health care provider. °Document Released: 05/17/2013 Document Revised: 12/30/2015 Document Reviewed: 11/16/2015 °Elsevier Interactive Patient Education © 2018 [employer] ° °

## 2017-10-06 NOTE — Procedures (Signed)
PROCEDURE SUMMARY:  Attempted US guided paracentesis from RLQ, including larger catheter size. Unfortunately fluid was extremely viscous/gelatinous and only yielded about 3 mL.  No immediate complications.  Pt tolerated well.   Specimen was not sent for labs.  Ascencion Dike PA-C 10/06/2017 11:17 AM

## 2017-10-06 NOTE — H&P (Signed)
Chief Complaint: Patient was seen in consultation today for omental mass biopsy  Referring Physician(s): Betsy Coder B  Supervising Physician: Marybelle Killings  Patient Status: [facility] - Out-pt  History of Present Illness: Shawna Schneider is a 83 y.o. female with past medical history of HLD, HTN, DVT, CVA and colon cancer who presents with signs of disease progression.   CT Abdomen Pelvis 09/27/17 shows: 1. Intra-abdominal tumor nodularity throughout the omentum and peritoneal surfaces is increased. Loculated cystic tumor masses in the pelvis flattened displace the rectum. Markedly severe omental infiltration by tumor, significantly increased from prior. Increased ascites. 2. Pulmonary nodules are stable in some cases and increased in size and others. 3. Increasing capsular tumor deposition along the liver with irregular hepatic morphology. 4. Contrast medium in the distal esophagus with mild wall thickening, probably from reflux or dysmotility. 5. Other imaging findings of potential clinical significance: Aortic Atherosclerosis (ICD10-I70.0). Left nephrectomy. Left adrenal adenoma. Multiple complex right renal lesions, probably complex cysts but technically nonspecific. Mild bilateral foraminal impingement at L4-5. Ventral midline hernia containing ascites and possibly tumor. Diffuse colonic diverticulosis.  IR consulted for omental mass biopsy and paracentesis at the request of Dr. Benay Spice.  Patient presents today in her usual state of health.  She has been NPO.  She appropriately held her lovenox.   Past Medical History:  Diagnosis Date  . Cancer ([place])   . Hyperlipemia   . Hypertension   . Solitary kidney     Past Surgical History:  Procedure Laterality Date  . COLONOSCOPY WITH PROPOFOL N/A 05/28/2017   Procedure: COLONOSCOPY WITH PROPOFOL;  Surgeon: Milus Banister, MD;  Location: [facility] ENDOSCOPY;  Service: Endoscopy;  Laterality: N/A;  .  ESOPHAGOGASTRODUODENOSCOPY N/A 05/28/2017   Procedure: ESOPHAGOGASTRODUODENOSCOPY (EGD);  Surgeon: Milus Banister, MD;  Location: [facility] ENDOSCOPY;  Service: Endoscopy;  Laterality: N/A;  . IR FLUORO GUIDE PORT INSERTION RIGHT  06/23/2017  . IR US GUIDE VASC ACCESS RIGHT  06/23/2017  . KIDNEY SURGERY      Allergies: Penicillins  Medications: Prior to Admission medications   Medication Sig Start Date End Date Taking? Authorizing Provider  allopurinol (ZYLOPRIM) 100 MG tablet Take 100 mg by mouth daily. 02/25/17  Yes [provider]  diltiazem (CARTIA XT) 180 MG 24 hr capsule Take 180 mg by mouth daily.   Yes [provider]  levothyroxine (SYNTHROID, LEVOTHROID) 75 MCG tablet Take 75 mcg by mouth daily. 04/16/17  Yes [provider]  loperamide (IMODIUM) 2 MG capsule Take 2 mg by mouth as needed for diarrhea or loose stools.   Yes [provider]  metoCLOPramide (REGLAN) 5 MG tablet Take 1 tablet (5 mg total) by mouth 3 (three) times daily before meals. 09/15/17  Yes Ladell Pier, MD  ondansetron (ZOFRAN) 8 MG tablet Take 1 tablet (8 mg total) by mouth every 8 (eight) hours as needed for nausea or vomiting. Begin 3 days after chemo. 09/15/17  Yes Ladell Pier, MD  prednisoLONE acetate (PRED FORTE) 1 % ophthalmic suspension Place 1 drop into the left eye daily.  03/19/17  Yes [provider]  rosuvastatin (CRESTOR) 20 MG tablet Take 5 mg daily by mouth. Pt breaks meds into 1/4   Yes [provider]  acetaminophen (TYLENOL) 500 MG tablet Take 1,000 mg every 8 (eight) hours as needed by mouth for moderate pain.    [provider]  enoxaparin (LOVENOX) 80 MG/0.8ML injection Inject 0.8 mLs (80 mg total) into the skin daily. 08/18/17  Owens Shark, NP  HYDROcodone-acetaminophen (NORCO) 5-325 MG tablet Take 0.5-1 tablets every 6 (six) hours as needed by mouth for moderate pain. 06/28/17   Owens Shark, NP  lidocaine-prilocaine (EMLA)  cream Apply to port site one hour prior to use. Do not rub in. Cover with plastic. 06/16/17   Ladell Pier, MD     History reviewed. No pertinent family history.  Social History   Socioeconomic History  . Marital status: Divorced    Spouse name: None  . Number of children: None  . Years of education: None  . Highest education level: None  Social Needs  . Financial resource strain: None  . Food insecurity - worry: None  . Food insecurity - inability: None  . Transportation needs - medical: None  . Transportation needs - non-medical: None  Occupational History  . None  Tobacco Use  . Smoking status: Never Smoker  . Smokeless tobacco: Never Used  Substance and Sexual Activity  . Alcohol use: No  . Drug use: No  . Sexual activity: None  Other Topics Concern  . None  Social History Narrative  . None     Review of Systems: A 12 point ROS discussed and pertinent positives are indicated in the HPI above.  All other systems are negative.  Review of Systems  Constitutional: Negative for fatigue and fever.  Respiratory: Negative for cough and shortness of breath.   Cardiovascular: Negative for chest pain.  Gastrointestinal: Positive for abdominal distention. Negative for abdominal pain.  Musculoskeletal: Negative for back pain.  Psychiatric/Behavioral: Negative for confusion.    Vital Signs: BP 125/88   Pulse 86   Temp 97.9 F (36.6 C) (Oral)   Resp 16   Ht 5\' 3"  (1.6 m)   Wt 172 lb (78 kg)   SpO2 98%   BMI 30.47 kg/m   Physical Exam  Constitutional: She is oriented to person, place, and time. She appears well-developed.  Cardiovascular: Normal rate, regular rhythm and normal heart sounds.  Pulmonary/Chest: Effort normal and breath sounds normal. No respiratory distress.  Abdominal: Bowel sounds are normal. She exhibits distension. There is no tenderness.  Neurological: She is alert and oriented to person, place, and time.  Skin: Skin is warm and dry.    Psychiatric: She has a normal mood and affect. Her behavior is normal. Judgment and thought content normal.  Nursing note and vitals reviewed.    MD Evaluation Airway: WNL Heart: WNL Abdomen: Other (comments) Abdomen comments: hernia Chest/ Lungs: WNL ASA  Classification: 3 Mallampati/Airway Score: One   Imaging: Ct Abdomen Pelvis Wo Contrast  Result Date: 09/27/2017 CLINICAL DATA:  Metastatic colon cancer restaging EXAM: CT CHEST, ABDOMEN AND PELVIS WITHOUT CONTRAST TECHNIQUE: Multidetector CT imaging of the chest, abdomen and pelvis was performed following the standard protocol without IV contrast. COMPARISON:  05/28/2017 FINDINGS: CT CHEST FINDINGS Cardiovascular: Right Port-A-Cath tip: Cavoatrial junction. Atherosclerotic calcification of the left anterior descending and circumflex coronary arteries, and of the aortic arch. Mediastinum/Nodes: Mild wall thickening in the distal esophagus, with contrast medium in the esophagus indicating dysmotility or reflux. Lungs/Pleura: Bilateral pulmonary nodules compatible with metastatic disease are present. An index lesion in the left upper lobe on image 46/6 measures 1.8 by 1.7 cm on image 46/6, formerly by my measurement the same. Some other nodules have mildly enlarged, for example the AP right apical nodule measures 0.8 by 0.6 cm, formerly 0.6 by 0.4 cm. Musculoskeletal: Thoracic spondylosis. CT ABDOMEN PELVIS FINDINGS Hepatobiliary: Irregular morphology  of the dome of the liver. Capsular tumor deposition along the liver including a 1.9 by 1.5 cm lesion along the right hepatic lobe on image 50/2 which appears to be new or significantly enlarged compared to the prior exam. Hepatic morphology suggests underlying cirrhosis. Pancreas: Unremarkable Spleen: Unremarkable Adrenals/Urinary Tract: Prior left nephrectomy. Low-density left adrenal gland suggesting adenoma. Multiple complex right renal lesions are probably complex cysts but technically  nonspecific. Stomach/Bowel: A loculated collection of fluid and tumor in the pelvis flattens the distal sigmoid colon and rectum. There is diffuse colonic diverticulosis. No dilated small bowel identified. Vascular/Lymphatic: Aortoiliac atherosclerotic vascular disease. Partially calcified left periaortic lymph node 1.0 cm in short axis on image 68/2, stable in size but the calcification appears to be new. The tumor deposit adjacent to the left iliac vessels measures 2.0 cm in short axis today, previously 1.5 cm, although this may be intra-abdominal rather than necessarily due to an adjacent lymph node. Reproductive: Poor visualization of the uterus and ovaries due to surrounding tumor Other: Massive omental caking of tumor, with marked ascites. Portions the site is are loculated, especially in the pelvis, causing mass effect on surrounding structures. The omentum is diffusely infiltrated by tumor. Musculoskeletal: Grade 1 anterolisthesis at L4-5 and L5-S1 with multilevel degenerative facet arthropathy and foraminal impingement bilaterally at L4-5. There is a ventral midline hernia in the upper abdomen containing ascites and probably tumor. IMPRESSION: 1. Intra-abdominal tumor nodularity throughout the omentum and peritoneal surfaces is increased. Loculated cystic tumor masses in the pelvis flattened displace the rectum. Markedly severe omental infiltration by tumor, significantly increased from prior. Increased ascites. 2. Pulmonary nodules are stable in some cases and increased in size and others. 3. Increasing capsular tumor deposition along the liver with irregular hepatic morphology. 4. Contrast medium in the distal esophagus with mild wall thickening, probably from reflux or dysmotility. 5. Other imaging findings of potential clinical significance: Aortic Atherosclerosis (ICD10-I70.0). Left nephrectomy. Left adrenal adenoma. Multiple complex right renal lesions, probably complex cysts but technically  nonspecific. Mild bilateral foraminal impingement at L4-5. Ventral midline hernia containing ascites and possibly tumor. Diffuse colonic diverticulosis. Electronically Signed   By: Van Clines M.D.   On: 09/27/2017 17:25   Ct Chest Wo Contrast  Result Date: 09/27/2017 CLINICAL DATA:  Metastatic colon cancer restaging EXAM: CT CHEST, ABDOMEN AND PELVIS WITHOUT CONTRAST TECHNIQUE: Multidetector CT imaging of the chest, abdomen and pelvis was performed following the standard protocol without IV contrast. COMPARISON:  05/28/2017 FINDINGS: CT CHEST FINDINGS Cardiovascular: Right Port-A-Cath tip: Cavoatrial junction. Atherosclerotic calcification of the left anterior descending and circumflex coronary arteries, and of the aortic arch. Mediastinum/Nodes: Mild wall thickening in the distal esophagus, with contrast medium in the esophagus indicating dysmotility or reflux. Lungs/Pleura: Bilateral pulmonary nodules compatible with metastatic disease are present. An index lesion in the left upper lobe on image 46/6 measures 1.8 by 1.7 cm on image 46/6, formerly by my measurement the same. Some other nodules have mildly enlarged, for example the AP right apical nodule measures 0.8 by 0.6 cm, formerly 0.6 by 0.4 cm. Musculoskeletal: Thoracic spondylosis. CT ABDOMEN PELVIS FINDINGS Hepatobiliary: Irregular morphology of the dome of the liver. Capsular tumor deposition along the liver including a 1.9 by 1.5 cm lesion along the right hepatic lobe on image 50/2 which appears to be new or significantly enlarged compared to the prior exam. Hepatic morphology suggests underlying cirrhosis. Pancreas: Unremarkable Spleen: Unremarkable Adrenals/Urinary Tract: Prior left nephrectomy. Low-density left adrenal gland suggesting adenoma. Multiple complex right  renal lesions are probably complex cysts but technically nonspecific. Stomach/Bowel: A loculated collection of fluid and tumor in the pelvis flattens the distal sigmoid colon  and rectum. There is diffuse colonic diverticulosis. No dilated small bowel identified. Vascular/Lymphatic: Aortoiliac atherosclerotic vascular disease. Partially calcified left periaortic lymph node 1.0 cm in short axis on image 68/2, stable in size but the calcification appears to be new. The tumor deposit adjacent to the left iliac vessels measures 2.0 cm in short axis today, previously 1.5 cm, although this may be intra-abdominal rather than necessarily due to an adjacent lymph node. Reproductive: Poor visualization of the uterus and ovaries due to surrounding tumor Other: Massive omental caking of tumor, with marked ascites. Portions the site is are loculated, especially in the pelvis, causing mass effect on surrounding structures. The omentum is diffusely infiltrated by tumor. Musculoskeletal: Grade 1 anterolisthesis at L4-5 and L5-S1 with multilevel degenerative facet arthropathy and foraminal impingement bilaterally at L4-5. There is a ventral midline hernia in the upper abdomen containing ascites and probably tumor. IMPRESSION: 1. Intra-abdominal tumor nodularity throughout the omentum and peritoneal surfaces is increased. Loculated cystic tumor masses in the pelvis flattened displace the rectum. Markedly severe omental infiltration by tumor, significantly increased from prior. Increased ascites. 2. Pulmonary nodules are stable in some cases and increased in size and others. 3. Increasing capsular tumor deposition along the liver with irregular hepatic morphology. 4. Contrast medium in the distal esophagus with mild wall thickening, probably from reflux or dysmotility. 5. Other imaging findings of potential clinical significance: Aortic Atherosclerosis (ICD10-I70.0). Left nephrectomy. Left adrenal adenoma. Multiple complex right renal lesions, probably complex cysts but technically nonspecific. Mild bilateral foraminal impingement at L4-5. Ventral midline hernia containing ascites and possibly tumor.  Diffuse colonic diverticulosis. Electronically Signed   By: Van Clines M.D.   On: 09/27/2017 17:25    Labs:  CBC: Recent Labs    07/19/17 0850 07/23/17 1328 08/11/17 0819  09/01/17 1116 09/15/17 0947 09/29/17 0836 10/06/17 0635  WBC 5.0 8.4 4.0   < > 3.7* 3.1* 3.7* 4.4  HGB 9.0* 10.2* 10.1*  --   --   --   --  8.2*  HCT 27.9* 31.5* 31.3*   < > 28.9* 27.5* 27.4* 25.6*  PLT 379 390 290   < > 259 240 194 325   < > = values in this interval not displayed.    COAGS: Recent Labs    05/24/17 1320 06/02/17 0425 06/23/17 0743 07/17/17 1150 10/06/17 0635  INR 1.11 1.07 1.18 1.37 1.05  APTT 33  --  41*  --   --     BMP: Recent Labs    08/18/17 1306 09/01/17 1115 09/15/17 0947 09/29/17 0836  NA 140 139 139 141  K 4.1 4.1 3.5 3.3*  CL 110* 110* 110* 113*  CO2 19* 20* 17* 19*  GLUCOSE 98 93 81 89  BUN 24 23 34* 13  CALCIUM 10.1 10.2 10.0 9.8  CREATININE 2.42* 2.17* 1.82* 1.76*  GFRNONAA 18* 20* 25* 26*  GFRAA 21* 24* 29* 30*    LIVER FUNCTION TESTS: Recent Labs    08/18/17 1306 09/01/17 1115 09/15/17 0947 09/29/17 0836  BILITOT 0.3 0.3 0.2 0.3  AST 18 16 17 13   ALT 10 11 10 8   ALKPHOS 108 99 88 84  PROT 7.0 6.6 6.7 6.6  ALBUMIN 2.8* 2.6* 2.7* 2.6*    TUMOR MARKERS: No results for input(s): AFPTM, CEA, CA199, CHROMGRNA in the last 8760 hours.  Assessment  and Plan: Patient with past medical history of colon cancer presents with complaint of omental mass.  IR consulted for omental mass biopsy and paracentesis at the request of Dr. Benay Spice. Case reviewed by Dr. Anselm Pancoast who approves patient for procedure.  Patient presents today in their usual state of health.  She has been NPO and is not currently on blood thinners.  Risks and benefits discussed with the patient including, but not limited to bleeding, infection, damage to adjacent structures or low yield requiring additional tests.  All of the patient's questions were answered, patient is agreeable  to proceed. Consent signed and in chart.  Thank you for this interesting consult.  I greatly enjoyed meeting Shawna Schneider and look forward to participating in their care.  A copy of this report was sent to the requesting provider on this date.  Electronically Signed: Docia Barrier, PA 10/06/2017, 8:12 AM   I spent a total of  30 Minutes   in face to face in clinical consultation, greater than 50% of which was counseling/coordinating care for omental mass.

## 2017-10-06 NOTE — Sedation Documentation (Signed)
Patient is resting comfortably. 

## 2017-10-06 NOTE — Procedures (Signed)
L omental mass Bx 18 g times 4 EBL 0 Comp 0

## 2017-10-07 ENCOUNTER — Inpatient Hospital Stay: Payer: BC Managed Care – PPO

## 2017-10-07 ENCOUNTER — Inpatient Hospital Stay: Payer: BC Managed Care – PPO | Admitting: Nutrition

## 2017-10-07 VITALS — BP 120/78 | HR 98 | Temp 98.0°F | Resp 19 | Wt 171.2 lb

## 2017-10-07 DIAGNOSIS — C186 Malignant neoplasm of descending colon: Secondary | ICD-10-CM

## 2017-10-07 DIAGNOSIS — C800 Disseminated malignant neoplasm, unspecified: Secondary | ICD-10-CM

## 2017-10-07 LAB — CMP (CANCER CENTER ONLY)
ALBUMIN: 2.4 g/dL — AB (ref 3.5–5.0)
ALT: 7 U/L (ref 0–55)
AST: 12 U/L (ref 5–34)
Alkaline Phosphatase: 88 U/L (ref 40–150)
Anion gap: 11 (ref 3–11)
BUN: 16 mg/dL (ref 7–26)
CO2: 19 mmol/L — ABNORMAL LOW (ref 22–29)
CREATININE: 1.8 mg/dL — AB (ref 0.60–1.10)
Calcium: 9.5 mg/dL (ref 8.4–10.4)
Chloride: 112 mmol/L — ABNORMAL HIGH (ref 98–109)
GFR, Est AFR Am: 29 mL/min — ABNORMAL LOW (ref >60)
GFR, Estimated: 25 mL/min — ABNORMAL LOW (ref >60)
GLUCOSE: 88 mg/dL (ref 70–140)
POTASSIUM: 3.7 mmol/L (ref 3.5–5.1)
Sodium: 142 mmol/L (ref 136–145)
Total Bilirubin: 0.3 mg/dL (ref 0.2–1.2)
Total Protein: 6.3 g/dL — ABNORMAL LOW (ref 6.4–8.3)

## 2017-10-07 LAB — CBC WITH DIFFERENTIAL (CANCER CENTER ONLY)
BASOS PCT: 0 %
Basophils Absolute: 0 10*3/uL (ref 0.0–0.1)
EOS PCT: 0 %
Eosinophils Absolute: 0 10*3/uL (ref 0.0–0.5)
HEMATOCRIT: 27.2 % — AB (ref 34.8–46.6)
Hemoglobin: 8.7 g/dL — ABNORMAL LOW (ref 11.6–15.9)
LYMPHS PCT: 31 %
Lymphs Abs: 1.7 10*3/uL (ref 0.9–3.3)
MCH: 27.4 pg (ref 25.1–34.0)
MCHC: 31.9 g/dL (ref 31.5–36.0)
MCV: 85.9 fL (ref 79.5–101.0)
MONO ABS: 0.9 10*3/uL (ref 0.1–0.9)
Monocytes Relative: 17 %
NEUTROS ABS: 2.8 10*3/uL (ref 1.5–6.5)
Neutrophils Relative %: 52 %
PLATELETS: 343 10*3/uL (ref 145–400)
RBC: 3.17 MIL/uL — AB (ref 3.70–5.45)
RDW: 21.7 % — AB (ref 11.2–14.5)
WBC: 5.4 10*3/uL (ref 3.9–10.3)

## 2017-10-07 LAB — CEA (IN HOUSE-CHCC): CEA (CHCC-In House): 19.78 ng/mL — ABNORMAL HIGH (ref 0.00–5.00)

## 2017-10-07 MED ORDER — IRINOTECAN HCL CHEMO INJECTION 100 MG/5ML
135.0000 mg/m2 | Freq: Once | INTRAVENOUS | Status: AC
  Administered 2017-10-07: 260 mg via INTRAVENOUS
  Filled 2017-10-07: qty 13

## 2017-10-07 MED ORDER — LEUCOVORIN CALCIUM INJECTION 350 MG
400.0000 mg/m2 | Freq: Once | INTRAVENOUS | Status: AC
  Administered 2017-10-07: 744 mg via INTRAVENOUS
  Filled 2017-10-07: qty 37.2

## 2017-10-07 MED ORDER — DEXAMETHASONE SODIUM PHOSPHATE 10 MG/ML IJ SOLN
INTRAMUSCULAR | Status: AC
Start: 2017-10-07 — End: ?
  Filled 2017-10-07: qty 1

## 2017-10-07 MED ORDER — PALONOSETRON HCL INJECTION 0.25 MG/5ML
0.2500 mg | Freq: Once | INTRAVENOUS | Status: AC
  Administered 2017-10-07: 0.25 mg via INTRAVENOUS

## 2017-10-07 MED ORDER — SODIUM CHLORIDE 0.9 % IV SOLN
Freq: Once | INTRAVENOUS | Status: AC
  Administered 2017-10-07: 11:00:00 via INTRAVENOUS

## 2017-10-07 MED ORDER — SODIUM CHLORIDE 0.9 % IV SOLN
2400.0000 mg/m2 | INTRAVENOUS | Status: DC
  Administered 2017-10-07: 4450 mg via INTRAVENOUS
  Filled 2017-10-07: qty 89

## 2017-10-07 MED ORDER — PALONOSETRON HCL INJECTION 0.25 MG/5ML
INTRAVENOUS | Status: AC
  Filled 2017-10-07: qty 5

## 2017-10-07 MED ORDER — ATROPINE SULFATE 1 MG/ML IJ SOLN
INTRAMUSCULAR | Status: AC
  Filled 2017-10-07: qty 1

## 2017-10-07 MED ORDER — ATROPINE SULFATE 1 MG/ML IJ SOLN
0.5000 mg | Freq: Once | INTRAMUSCULAR | Status: AC | PRN
  Administered 2017-10-07: 0.5 mg via INTRAVENOUS

## 2017-10-07 MED ORDER — SODIUM CHLORIDE 0.9% FLUSH
10.0000 mL | Freq: Once | INTRAVENOUS | Status: AC
  Administered 2017-10-07: 10 mL
  Filled 2017-10-07: qty 10

## 2017-10-07 MED ORDER — DEXAMETHASONE SODIUM PHOSPHATE 10 MG/ML IJ SOLN
10.0000 mg | Freq: Once | INTRAMUSCULAR | Status: AC
  Administered 2017-10-07: 10 mg via INTRAVENOUS

## 2017-10-07 NOTE — Progress Notes (Signed)
Nutrition follow-up completed with patient diagnosed with unknown primary cancer. Weight was documented as 171.25 pounds on February 28. Patient has abdominal distention and right lower leg edema. Labs were reviewed. Patient reports she has diarrhea, especially after drinking oral nutrition supplements.  Nutrition diagnosis: Inadequate oral intake continues  Intervention: Reviewed high-calorie high-protein meals and snacks with patient taking into consideration her tolerance of various foods. Recommended different oral nutrition supplements for patient. Encourage patient to take Imodium as prescribed by physician after diarrhea. Questions were answered.  Teach back method used.  Monitoring evaluation goals: Patient will tolerate adequate calories and protein for improved quality of life.  Next visit: To be scheduled as needed.  **Disclaimer: This note was dictated with voice recognition software. Similar sounding words can inadvertently be transcribed and this note may contain transcription errors which may not have been corrected upon publication of note.**

## 2017-10-07 NOTE — Progress Notes (Signed)
  Oncology Nurse Navigator Documentation  Navigator Location: [facility] (10/07/17 1458)   )Navigator Encounter Type: Treatment (10/07/17 1458)                         Barriers/Navigation Needs: No barriers at this time (10/07/17 1458)   Interventions: Psycho-social support (10/07/17 1458) Met with patient in infusion room today. Patient processed what it is like to be at this stage of her cancer journey. Patient is still living independently and hopes to continue as long as possible. Patient reports being tired and wonders "what the end will be like."  Encouragement and support offered.            Acuity: Level 2 (10/07/17 1458)         Time Spent with Patient: 30 (10/07/17 1458)

## 2017-10-07 NOTE — Progress Notes (Signed)
Ok to proceed with treatment today with 10/07/17 labs per Dr. Benay Spice.   At end of infusion, RN called to Patient's room with reports of patient vomiting. Medication administered and patient expressed relief almost instantaneously. Patient denies concerns or complaints on exit.

## 2017-10-07 NOTE — Patient Instructions (Signed)
[facility] Discharge Instructions for Patients Receiving Chemotherapy  Today you received the following chemotherapy agents Irinotecan, Leucovorin, Flurouracil  To help prevent nausea and vomiting after your treatment, we encourage you to take your nausea medication as directed    If you develop nausea and vomiting that is not controlled by your nausea medication, call the clinic.   BELOW ARE SYMPTOMS THAT SHOULD BE REPORTED IMMEDIATELY:  *FEVER GREATER THAN 100.5 F  *CHILLS WITH OR WITHOUT FEVER  NAUSEA AND VOMITING THAT IS NOT CONTROLLED WITH YOUR NAUSEA MEDICATION  *UNUSUAL SHORTNESS OF BREATH  *UNUSUAL BRUISING OR BLEEDING  TENDERNESS IN MOUTH AND THROAT WITH OR WITHOUT PRESENCE OF ULCERS  *URINARY PROBLEMS  *BOWEL PROBLEMS  UNUSUAL RASH Items with * indicate a potential emergency and should be followed up as soon as possible.  Feel free to call the clinic should you have any questions or concerns. The clinic phone number is [phone].  Please show the [place] at check-in to the Emergency Department and triage nurse.  Irinotecan injection What is this medicine? IRINOTECAN (ir in oh TEE kan ) is a chemotherapy drug. It is used to treat colon and rectal cancer. This medicine may be used for other purposes; ask your health care provider or pharmacist if you have questions. COMMON BRAND NAME(S): Camptosar What should I tell my health care provider before I take this medicine? They need to know if you have any of these conditions: -blood disorders -dehydration -diarrhea -infection (especially a virus infection such as chickenpox, cold sores, or herpes) -liver disease -low blood counts, like low white cell, platelet, or red cell counts -recent or ongoing radiation therapy -an unusual or allergic reaction to irinotecan, sorbitol, other chemotherapy, other medicines, foods, dyes, or preservatives -pregnant or trying to get  pregnant -breast-feeding How should I use this medicine? This drug is given as an infusion into a vein. It is administered in a hospital or clinic by a specially trained health care professional. Talk to your pediatrician regarding the use of this medicine in children. Special care may be needed. Overdosage: If you think you have taken too much of this medicine contact a poison control center or emergency room at once. NOTE: This medicine is only for you. Do not share this medicine with others. What if I miss a dose? It is important not to miss your dose. Call your doctor or health care professional if you are unable to keep an appointment. What may interact with this medicine? Do not take this medicine with any of the following medications: -atazanavir -certain medicines for fungal infections like itraconazole and ketoconazole -St. John's Wort This medicine may also interact with the following medications: -dexamethasone -diuretics -laxatives -medicines for seizures like carbamazepine, mephobarbital, phenobarbital, phenytoin, primidone -medicines to increase blood counts like filgrastim, pegfilgrastim, sargramostim -prochlorperazine -vaccines This list may not describe all possible interactions. Give your health care provider a list of all the medicines, herbs, non-prescription drugs, or dietary supplements you use. Also tell them if you smoke, drink alcohol, or use illegal drugs. Some items may interact with your medicine. What should I watch for while using this medicine? Your condition will be monitored carefully while you are receiving this medicine. You will need important blood work done while you are taking this medicine. This drug may make you feel generally unwell. This is not uncommon, as chemotherapy can affect healthy cells as well as cancer cells. Report any side effects. Continue your course of treatment even  though you feel ill unless your doctor tells you to stop. In some  cases, you may be given additional medicines to help with side effects. Follow all directions for their use. You may get drowsy or dizzy. Do not drive, use machinery, or do anything that needs mental alertness until you know how this medicine affects you. Do not stand or sit up quickly, especially if you are an older patient. This reduces the risk of dizzy or fainting spells. Call your doctor or health care professional for advice if you get a fever, chills or sore throat, or other symptoms of a cold or flu. Do not treat yourself. This drug decreases your body's ability to fight infections. Try to avoid being around people who are sick. This medicine may increase your risk to bruise or bleed. Call your doctor or health care professional if you notice any unusual bleeding. Be careful brushing and flossing your teeth or using a toothpick because you may get an infection or bleed more easily. If you have any dental work done, tell your dentist you are receiving this medicine. Avoid taking products that contain aspirin, acetaminophen, ibuprofen, naproxen, or ketoprofen unless instructed by your doctor. These medicines may hide a fever. Do not become pregnant while taking this medicine. Women should inform their doctor if they wish to become pregnant or think they might be pregnant. There is a potential for serious side effects to an unborn child. Talk to your health care professional or pharmacist for more information. Do not breast-feed an infant while taking this medicine. What side effects may I notice from receiving this medicine? Side effects that you should report to your doctor or health care professional as soon as possible: -allergic reactions like skin rash, itching or hives, swelling of the face, lips, or tongue -low blood counts - this medicine may decrease the number of white blood cells, red blood cells and platelets. You may be at increased risk for infections and bleeding. -signs of infection  - fever or chills, cough, sore throat, pain or difficulty passing urine -signs of decreased platelets or bleeding - bruising, pinpoint red spots on the skin, black, tarry stools, blood in the urine -signs of decreased red blood cells - unusually weak or tired, fainting spells, lightheadedness -breathing problems -chest pain -diarrhea -feeling faint or lightheaded, falls -flushing, runny nose, sweating during infusion -mouth sores or pain -pain, swelling, redness or irritation where injected -pain, swelling, warmth in the leg -pain, tingling, numbness in the hands or feet -problems with balance, talking, walking -stomach cramps, pain -trouble passing urine or change in the amount of urine -vomiting as to be unable to hold down drinks or food -yellowing of the eyes or skin Side effects that usually do not require medical attention (report to your doctor or health care professional if they continue or are bothersome): -constipation -hair loss -headache -loss of appetite -nausea, vomiting -stomach upset This list may not describe all possible side effects. Call your doctor for medical advice about side effects. You may report side effects to FDA at 1-800-FDA-1088. Where should I keep my medicine? This drug is given in a hospital or clinic and will not be stored at home. NOTE: This sheet is a summary. It may not cover all possible information. If you have questions about this medicine, talk to your doctor, pharmacist, or health care provider.  2018 Elsevier/Gold Standard (2013-01-23 16:29:32)

## 2017-10-08 ENCOUNTER — Telehealth: Payer: Self-pay | Admitting: *Deleted

## 2017-10-08 NOTE — Telephone Encounter (Signed)
-----   Message from Ladell Pier, MD sent at 10/07/2017  6:31 PM EST ----- Please request foundation 1 on this tissue ----- Message ----- From: Interface, Lab In [facility] Sent: 10/07/2017  11:50 AM To: Ladell Pier, MD

## 2017-10-08 NOTE — Telephone Encounter (Signed)
Called [facility], requested pathologist to determine whether there is enough tissue to send Foundation One on [id number].

## 2017-10-08 NOTE — Telephone Encounter (Signed)
Return call from [facility] in pathology at [facility]: There is not enough tissue to send biopsy for Foundation One. Message to MD.

## 2017-10-09 ENCOUNTER — Inpatient Hospital Stay: Payer: BC Managed Care – PPO | Attending: Nurse Practitioner

## 2017-10-09 VITALS — BP 118/81 | HR 87 | Temp 97.9°F | Resp 22

## 2017-10-09 DIAGNOSIS — Z452 Encounter for adjustment and management of vascular access device: Secondary | ICD-10-CM | POA: Insufficient documentation

## 2017-10-09 DIAGNOSIS — Z5111 Encounter for antineoplastic chemotherapy: Secondary | ICD-10-CM | POA: Insufficient documentation

## 2017-10-09 DIAGNOSIS — C186 Malignant neoplasm of descending colon: Secondary | ICD-10-CM | POA: Diagnosis not present

## 2017-10-09 DIAGNOSIS — D500 Iron deficiency anemia secondary to blood loss (chronic): Secondary | ICD-10-CM | POA: Diagnosis not present

## 2017-10-09 DIAGNOSIS — Z86718 Personal history of other venous thrombosis and embolism: Secondary | ICD-10-CM | POA: Diagnosis not present

## 2017-10-09 DIAGNOSIS — Z9221 Personal history of antineoplastic chemotherapy: Secondary | ICD-10-CM | POA: Insufficient documentation

## 2017-10-09 DIAGNOSIS — N289 Disorder of kidney and ureter, unspecified: Secondary | ICD-10-CM | POA: Insufficient documentation

## 2017-10-09 DIAGNOSIS — C786 Secondary malignant neoplasm of retroperitoneum and peritoneum: Secondary | ICD-10-CM | POA: Insufficient documentation

## 2017-10-09 DIAGNOSIS — R630 Anorexia: Secondary | ICD-10-CM | POA: Insufficient documentation

## 2017-10-09 DIAGNOSIS — R918 Other nonspecific abnormal finding of lung field: Secondary | ICD-10-CM | POA: Insufficient documentation

## 2017-10-09 DIAGNOSIS — Z8673 Personal history of transient ischemic attack (TIA), and cerebral infarction without residual deficits: Secondary | ICD-10-CM | POA: Diagnosis not present

## 2017-10-09 DIAGNOSIS — R110 Nausea: Secondary | ICD-10-CM | POA: Insufficient documentation

## 2017-10-09 DIAGNOSIS — D709 Neutropenia, unspecified: Secondary | ICD-10-CM | POA: Diagnosis not present

## 2017-10-09 DIAGNOSIS — C800 Disseminated malignant neoplasm, unspecified: Secondary | ICD-10-CM

## 2017-10-09 DIAGNOSIS — Z8541 Personal history of malignant neoplasm of cervix uteri: Secondary | ICD-10-CM | POA: Diagnosis not present

## 2017-10-09 MED ORDER — SODIUM CHLORIDE 0.9% FLUSH
10.0000 mL | INTRAVENOUS | Status: DC | PRN
  Administered 2017-10-09: 10 mL
  Filled 2017-10-09: qty 10

## 2017-10-09 MED ORDER — HEPARIN SOD (PORK) LOCK FLUSH 100 UNIT/ML IV SOLN
500.0000 [IU] | Freq: Once | INTRAVENOUS | Status: AC | PRN
  Administered 2017-10-09: 500 [IU]
  Filled 2017-10-09: qty 5

## 2017-10-09 NOTE — Patient Instructions (Signed)
Reviewed [facility] discharge instructions.

## 2017-10-14 ENCOUNTER — Other Ambulatory Visit: Payer: Self-pay | Admitting: Oncology

## 2017-10-20 ENCOUNTER — Inpatient Hospital Stay: Payer: BC Managed Care – PPO

## 2017-10-20 ENCOUNTER — Encounter: Payer: Self-pay | Admitting: Nurse Practitioner

## 2017-10-20 ENCOUNTER — Telehealth: Payer: Self-pay | Admitting: Nurse Practitioner

## 2017-10-20 ENCOUNTER — Inpatient Hospital Stay (HOSPITAL_BASED_OUTPATIENT_CLINIC_OR_DEPARTMENT_OTHER): Payer: BC Managed Care – PPO | Admitting: Nurse Practitioner

## 2017-10-20 VITALS — BP 120/79 | HR 97 | Temp 97.7°F | Resp 20 | Ht 63.0 in | Wt 160.5 lb

## 2017-10-20 DIAGNOSIS — Z9221 Personal history of antineoplastic chemotherapy: Secondary | ICD-10-CM

## 2017-10-20 DIAGNOSIS — N189 Chronic kidney disease, unspecified: Secondary | ICD-10-CM | POA: Diagnosis not present

## 2017-10-20 DIAGNOSIS — Z452 Encounter for adjustment and management of vascular access device: Secondary | ICD-10-CM | POA: Diagnosis not present

## 2017-10-20 DIAGNOSIS — C786 Secondary malignant neoplasm of retroperitoneum and peritoneum: Secondary | ICD-10-CM

## 2017-10-20 DIAGNOSIS — R918 Other nonspecific abnormal finding of lung field: Secondary | ICD-10-CM

## 2017-10-20 DIAGNOSIS — C186 Malignant neoplasm of descending colon: Secondary | ICD-10-CM

## 2017-10-20 DIAGNOSIS — Z86718 Personal history of other venous thrombosis and embolism: Secondary | ICD-10-CM

## 2017-10-20 DIAGNOSIS — C800 Disseminated malignant neoplasm, unspecified: Secondary | ICD-10-CM

## 2017-10-20 DIAGNOSIS — D709 Neutropenia, unspecified: Secondary | ICD-10-CM

## 2017-10-20 DIAGNOSIS — R630 Anorexia: Secondary | ICD-10-CM | POA: Diagnosis not present

## 2017-10-20 DIAGNOSIS — D500 Iron deficiency anemia secondary to blood loss (chronic): Secondary | ICD-10-CM | POA: Diagnosis not present

## 2017-10-20 DIAGNOSIS — Z8673 Personal history of transient ischemic attack (TIA), and cerebral infarction without residual deficits: Secondary | ICD-10-CM | POA: Diagnosis not present

## 2017-10-20 DIAGNOSIS — R110 Nausea: Secondary | ICD-10-CM | POA: Diagnosis not present

## 2017-10-20 DIAGNOSIS — Z8541 Personal history of malignant neoplasm of cervix uteri: Secondary | ICD-10-CM

## 2017-10-20 LAB — CBC WITH DIFFERENTIAL (CANCER CENTER ONLY)
BASOS ABS: 0 10*3/uL (ref 0.0–0.1)
BASOS PCT: 0 %
Eosinophils Absolute: 0.1 10*3/uL (ref 0.0–0.5)
Eosinophils Relative: 8 %
HCT: 27.4 % — ABNORMAL LOW (ref 34.8–46.6)
HEMOGLOBIN: 8.5 g/dL — AB (ref 11.6–15.9)
LYMPHS PCT: 43 %
Lymphs Abs: 0.8 10*3/uL — ABNORMAL LOW (ref 0.9–3.3)
MCH: 26.8 pg (ref 25.1–34.0)
MCHC: 31.2 g/dL — AB (ref 31.5–36.0)
MCV: 85.9 fL (ref 79.5–101.0)
MONOS PCT: 11 %
Monocytes Absolute: 0.2 10*3/uL (ref 0.1–0.9)
NEUTROS ABS: 0.7 10*3/uL — AB (ref 1.5–6.5)
NEUTROS PCT: 38 %
Platelet Count: 206 10*3/uL (ref 145–400)
RBC: 3.19 MIL/uL — ABNORMAL LOW (ref 3.70–5.45)
RDW: 20.5 % — ABNORMAL HIGH (ref 11.2–14.5)
WBC Count: 1.8 10*3/uL — ABNORMAL LOW (ref 3.9–10.3)

## 2017-10-20 LAB — CMP (CANCER CENTER ONLY)
ALK PHOS: 78 U/L (ref 40–150)
ALT: <6 U/L (ref 0–55)
ANION GAP: 12 — AB (ref 3–11)
AST: 9 U/L (ref 5–34)
Albumin: 2.5 g/dL — ABNORMAL LOW (ref 3.5–5.0)
BUN: 14 mg/dL (ref 7–26)
CALCIUM: 10 mg/dL (ref 8.4–10.4)
CO2: 18 mmol/L — AB (ref 22–29)
Chloride: 110 mmol/L — ABNORMAL HIGH (ref 98–109)
Creatinine: 1.84 mg/dL — ABNORMAL HIGH (ref 0.60–1.10)
GFR, EST AFRICAN AMERICAN: 29 mL/min — AB (ref >60)
GFR, Estimated: 25 mL/min — ABNORMAL LOW (ref >60)
Glucose, Bld: 78 mg/dL (ref 70–140)
POTASSIUM: 3.7 mmol/L (ref 3.5–5.1)
SODIUM: 140 mmol/L (ref 136–145)
Total Bilirubin: 0.3 mg/dL (ref 0.2–1.2)
Total Protein: 6.6 g/dL (ref 6.4–8.3)

## 2017-10-20 LAB — CEA (IN HOUSE-CHCC): CEA (CHCC-IN HOUSE): 15.17 ng/mL — AB (ref 0.00–5.00)

## 2017-10-20 MED ORDER — DEXAMETHASONE 4 MG PO TABS: ORAL_TABLET | ORAL | 0 refills | Status: DC

## 2017-10-20 MED ORDER — SODIUM CHLORIDE 0.9% FLUSH
10.0000 mL | Freq: Once | INTRAVENOUS | Status: AC
  Administered 2017-10-20: 10 mL
  Filled 2017-10-20: qty 10

## 2017-10-20 MED ORDER — HEPARIN SOD (PORK) LOCK FLUSH 100 UNIT/ML IV SOLN
500.0000 [IU] | Freq: Once | INTRAVENOUS | Status: AC
  Administered 2017-10-20: 500 [IU]
  Filled 2017-10-20: qty 5

## 2017-10-20 NOTE — Progress Notes (Signed)
[facility] OFFICE PROGRESS NOTE   Diagnosis: Colon cancer  INTERVAL HISTORY:   Shawna Schneider returns as scheduled.  She completed cycle 1 FOLFIRI 10/07/2017.  She reports acute and delayed nausea.  She is taking Reglan 3 times daily and Zofran every 8 hours.  She notes some improvement after taking Zofran.  She developed a sore throat after several episodes of vomiting.  No mouth sores.  She had some diarrhea after the chemotherapy.  She took an antidiarrheal medication and developed constipation.  Appetite is poor.  She reports adequate fluid intake.  No fever or chills.  Objective:  Vital signs in last 24 hours:  Blood pressure 120/79, pulse 97, temperature 97.7 F (36.5 C), temperature source Oral, resp. rate 20, height 5\' 3"  (1.6 m), weight 160 lb 8 oz (72.8 kg), SpO2 100 %.    HEENT: No thrush or ulcers.  Posterior pharynx is without erythema or exudate. Resp: Lungs clear bilaterally. Cardio: Regular rate and rhythm. GI: Abdomen distended.  No hepatomegaly. Vascular: Right lower leg is edematous. Neuro: Alert and oriented. Port-A-Cath without erythema.   Lab Results:  Lab Results  Component Value Date   WBC 1.8 (L) 10/20/2017   HGB 8.2 (L) 10/06/2017   HCT 27.4 (L) 10/20/2017   MCV 85.9 10/20/2017   PLT 206 10/20/2017   NEUTROABS 0.7 (L) 10/20/2017    Imaging:  No results found.  Medications: I have reviewed the patient's current medications.  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 Findings consistent with a gastrointestinal or pancreatico/biliary primary.  Cycle 1 FOLFOX 06/28/2017  Cycle 2 FOLFOX  07/24/2017  Cycle 3 FOLFOX 08/18/2017  Cycle 4 FOLFOX 09/01/2017  Cycle 5 FOLFOX 09/15/2017  CTs 09/27/2017- increased ascites and omental nodularity, stable and increased pulmonary nodules, increased capsular liver deposit  Cycle 1 FOLFIRI 10/07/2017  2. 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 GI bleeding after TPA 05/24/2017-resolved  4. Bilateral lower extremity DVTs confirmed on Doppler 05/25/2017--initially onEliquis.Eliquis discontinued due to a GI bleed December 2018.  Negative bilateral lower extremity Doppler 07/17/2017  5. Small PFO  6. Cervical cancer at age 84  7. Papillary renal cell carcinoma 2006, status post left nephrectomy  8.Renal insufficiency. Followed by Dr. Moshe Cipro.  9.Port-A-Cath placement 06/23/2017  10.Left hip/leg pain. Likely related to #1.Resolved 07/12/2017.  11.Hospitalization with GI bleed 07/17/2017 through 07/19/2017.  12.Anemia secondary to GI blood loss, renal failure, chemotherapy. Red cell transfusion 07/13/2017.  13.Right lower extremity DVT/right upper lobe pulmonary embolism1/09/2017. On Lovenox.  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  Neutropenia following cycle 1 FOLFIRI.  16.  Acute and delayed nausea secondary to chemotherapy.    Disposition: Shawna Schneider has completed 1 cycle of FOLFIRI.  She is neutropenic on labs today.  We will hold today's treatment and reschedule for 1 week.  We reviewed neutropenic precautions.  She understands to contact the office with fever, chills, other signs of infection.  She experienced both acute and delayed nausea.  She attributes the poor appetite/weight loss to nausea.  She will begin dexamethasone 4 mg twice daily for 3 days.  We will adjust the premedication regimen with cycle 2 FOLFIRI to include Emend.  We discussed IV fluids.  She declines this due  to a marked increase in  abdominal distention after she received IV fluids recently.  She will return for lab, follow-up and chemotherapy in 1 week.  She will contact the office in the interim as outlined above or with any other problems.    25 minutes were spent face-to-face at today's visit with the majority of that time involved in counseling/coordination of care.    Ned Card ANP/GNP-BC   10/20/2017  12:21 PM

## 2017-10-20 NOTE — Telephone Encounter (Signed)
Unable to schedule 3/13 los - due to capped day - logged - patient to be contacted when apt when scheduled.

## 2017-10-21 ENCOUNTER — Telehealth: Payer: Self-pay | Admitting: Nurse Practitioner

## 2017-10-21 NOTE — Telephone Encounter (Signed)
Scheduled appt per 3/13 los - Patient is aware of appt date and time.

## 2017-10-27 ENCOUNTER — Encounter: Payer: Self-pay | Admitting: Oncology

## 2017-10-27 ENCOUNTER — Inpatient Hospital Stay: Payer: BC Managed Care – PPO

## 2017-10-27 ENCOUNTER — Inpatient Hospital Stay (HOSPITAL_BASED_OUTPATIENT_CLINIC_OR_DEPARTMENT_OTHER): Payer: BC Managed Care – PPO | Admitting: Oncology

## 2017-10-27 VITALS — BP 120/89 | HR 90 | Temp 97.5°F | Resp 18 | Ht 63.0 in | Wt 157.8 lb

## 2017-10-27 DIAGNOSIS — Z9221 Personal history of antineoplastic chemotherapy: Secondary | ICD-10-CM

## 2017-10-27 DIAGNOSIS — Z452 Encounter for adjustment and management of vascular access device: Secondary | ICD-10-CM

## 2017-10-27 DIAGNOSIS — D709 Neutropenia, unspecified: Secondary | ICD-10-CM | POA: Diagnosis not present

## 2017-10-27 DIAGNOSIS — R630 Anorexia: Secondary | ICD-10-CM | POA: Diagnosis not present

## 2017-10-27 DIAGNOSIS — N289 Disorder of kidney and ureter, unspecified: Secondary | ICD-10-CM

## 2017-10-27 DIAGNOSIS — Z5111 Encounter for antineoplastic chemotherapy: Secondary | ICD-10-CM | POA: Diagnosis not present

## 2017-10-27 DIAGNOSIS — C800 Disseminated malignant neoplasm, unspecified: Secondary | ICD-10-CM

## 2017-10-27 DIAGNOSIS — Z86718 Personal history of other venous thrombosis and embolism: Secondary | ICD-10-CM

## 2017-10-27 DIAGNOSIS — R918 Other nonspecific abnormal finding of lung field: Secondary | ICD-10-CM | POA: Diagnosis not present

## 2017-10-27 DIAGNOSIS — R110 Nausea: Secondary | ICD-10-CM

## 2017-10-27 DIAGNOSIS — Z8673 Personal history of transient ischemic attack (TIA), and cerebral infarction without residual deficits: Secondary | ICD-10-CM

## 2017-10-27 DIAGNOSIS — C186 Malignant neoplasm of descending colon: Secondary | ICD-10-CM | POA: Diagnosis not present

## 2017-10-27 DIAGNOSIS — C786 Secondary malignant neoplasm of retroperitoneum and peritoneum: Secondary | ICD-10-CM

## 2017-10-27 DIAGNOSIS — D500 Iron deficiency anemia secondary to blood loss (chronic): Secondary | ICD-10-CM | POA: Diagnosis not present

## 2017-10-27 DIAGNOSIS — Z8541 Personal history of malignant neoplasm of cervix uteri: Secondary | ICD-10-CM

## 2017-10-27 LAB — CBC WITH DIFFERENTIAL (CANCER CENTER ONLY)
Basophils Absolute: 0 10*3/uL (ref 0.0–0.1)
Basophils Relative: 0 %
Eosinophils Absolute: 0.2 10*3/uL (ref 0.0–0.5)
Eosinophils Relative: 3 %
HEMATOCRIT: 28.4 % — AB (ref 34.8–46.6)
HEMOGLOBIN: 9.1 g/dL — AB (ref 11.6–15.9)
LYMPHS PCT: 19 %
Lymphs Abs: 1.5 10*3/uL (ref 0.9–3.3)
MCH: 27.6 pg (ref 25.1–34.0)
MCHC: 31.9 g/dL (ref 31.5–36.0)
MCV: 86.4 fL (ref 79.5–101.0)
MONO ABS: 0.9 10*3/uL (ref 0.1–0.9)
MONOS PCT: 12 %
NEUTROS ABS: 5.1 10*3/uL (ref 1.5–6.5)
Neutrophils Relative %: 66 %
Platelet Count: 511 10*3/uL — ABNORMAL HIGH (ref 145–400)
RBC: 3.28 MIL/uL — ABNORMAL LOW (ref 3.70–5.45)
RDW: 21.6 % — AB (ref 11.2–14.5)
WBC: 7.8 10*3/uL (ref 3.9–10.3)

## 2017-10-27 LAB — CMP (CANCER CENTER ONLY)
ALK PHOS: 92 U/L (ref 40–150)
ALT: 7 U/L (ref 0–55)
AST: 10 U/L (ref 5–34)
Albumin: 2.5 g/dL — ABNORMAL LOW (ref 3.5–5.0)
Anion gap: 11 (ref 3–11)
BILIRUBIN TOTAL: 0.3 mg/dL (ref 0.2–1.2)
BUN: 22 mg/dL (ref 7–26)
CALCIUM: 9.9 mg/dL (ref 8.4–10.4)
CO2: 21 mmol/L — ABNORMAL LOW (ref 22–29)
CREATININE: 1.71 mg/dL — AB (ref 0.60–1.10)
Chloride: 110 mmol/L — ABNORMAL HIGH (ref 98–109)
GFR, EST NON AFRICAN AMERICAN: 27 mL/min — AB (ref >60)
GFR, Est AFR Am: 31 mL/min — ABNORMAL LOW (ref >60)
Glucose, Bld: 93 mg/dL (ref 70–140)
POTASSIUM: 3.5 mmol/L (ref 3.5–5.1)
Sodium: 142 mmol/L (ref 136–145)
TOTAL PROTEIN: 6.5 g/dL (ref 6.4–8.3)

## 2017-10-27 MED ORDER — ATROPINE SULFATE 1 MG/ML IJ SOLN
0.5000 mg | Freq: Once | INTRAMUSCULAR | Status: AC | PRN
  Administered 2017-10-27: 0.5 mg via INTRAVENOUS

## 2017-10-27 MED ORDER — PALONOSETRON HCL INJECTION 0.25 MG/5ML
INTRAVENOUS | Status: AC
  Filled 2017-10-27: qty 5

## 2017-10-27 MED ORDER — SODIUM CHLORIDE 0.9 % IV SOLN
2400.0000 mg/m2 | INTRAVENOUS | Status: DC
  Administered 2017-10-27: 4450 mg via INTRAVENOUS
  Filled 2017-10-27: qty 89

## 2017-10-27 MED ORDER — SODIUM CHLORIDE 0.9% FLUSH
10.0000 mL | INTRAVENOUS | Status: DC | PRN
  Filled 2017-10-27: qty 10

## 2017-10-27 MED ORDER — ATROPINE SULFATE 1 MG/ML IJ SOLN
INTRAMUSCULAR | Status: AC
  Filled 2017-10-27: qty 1

## 2017-10-27 MED ORDER — PALONOSETRON HCL INJECTION 0.25 MG/5ML
0.2500 mg | Freq: Once | INTRAVENOUS | Status: AC
  Administered 2017-10-27: 0.25 mg via INTRAVENOUS

## 2017-10-27 MED ORDER — LEUCOVORIN CALCIUM INJECTION 350 MG
400.0000 mg/m2 | Freq: Once | INTRAVENOUS | Status: AC
  Administered 2017-10-27: 744 mg via INTRAVENOUS
  Filled 2017-10-27: qty 25

## 2017-10-27 MED ORDER — HEPARIN SOD (PORK) LOCK FLUSH 100 UNIT/ML IV SOLN
500.0000 [IU] | Freq: Once | INTRAVENOUS | Status: DC | PRN
  Filled 2017-10-27: qty 5

## 2017-10-27 MED ORDER — IRINOTECAN HCL CHEMO INJECTION 100 MG/5ML
100.0000 mg/m2 | Freq: Once | INTRAVENOUS | Status: AC
  Administered 2017-10-27: 180 mg via INTRAVENOUS
  Filled 2017-10-27: qty 9

## 2017-10-27 MED ORDER — SODIUM CHLORIDE 0.9 % IV SOLN
Freq: Once | INTRAVENOUS | Status: AC
  Administered 2017-10-27: 13:00:00 via INTRAVENOUS

## 2017-10-27 MED ORDER — SODIUM CHLORIDE 0.9 % IV SOLN
Freq: Once | INTRAVENOUS | Status: AC
  Administered 2017-10-27: 14:00:00 via INTRAVENOUS
  Filled 2017-10-27: qty 5

## 2017-10-27 MED ORDER — SODIUM CHLORIDE 0.9% FLUSH
10.0000 mL | Freq: Once | INTRAVENOUS | Status: AC
  Administered 2017-10-27: 10 mL
  Filled 2017-10-27: qty 10

## 2017-10-27 NOTE — Progress Notes (Signed)
Ok to proceed with Creatinine today per Lattie Haw NP

## 2017-10-27 NOTE — Progress Notes (Signed)
[place] OFFICE PROGRESS NOTE   Diagnosis: Colon cancer  INTERVAL HISTORY:   Shawna Schneider returns as scheduled.  Nausea improved after taking Decadron last week.  She reports constipation for the past week.  No bleeding.  No pain.  Leg swelling has improved.  Objective:  Vital signs in last 24 hours:  Blood pressure 120/89, pulse 90, temperature (!) 97.5 F (36.4 C), temperature source Oral, resp. rate 18, height 5\' 3"  (1.6 m), weight 157 lb 12.8 oz (71.6 kg), SpO2 99 %.    HEENT: No thrush or ulcers Resp: Lungs clear bilaterally Cardio: Regular rate and rhythm GI: Mildly distended, no mass, no hepatomegaly, nontender Vascular: Trace edema at the right lower leg.  Portacath/PICC-without erythema  Lab Results:  Lab Results  Component Value Date   WBC 7.8 10/27/2017   HGB 8.2 (L) 10/06/2017   HCT 28.4 (L) 10/27/2017   MCV 86.4 10/27/2017   PLT 511 (H) 10/27/2017   NEUTROABS 5.1 10/27/2017    CMP     Component Value Date/Time   NA 142 10/27/2017 1103   NA 139 08/11/2017 0819   K 3.5 10/27/2017 1103   K 4.0 08/11/2017 0819   CL 110 (H) 10/27/2017 1103   CO2 21 (L) 10/27/2017 1103   CO2 15 (L) 08/11/2017 0819   GLUCOSE 93 10/27/2017 1103   GLUCOSE 113 08/11/2017 0819   BUN 22 10/27/2017 1103   BUN 23.5 08/11/2017 0819   CREATININE 1.71 (H) 10/27/2017 1103   CREATININE 2.7 (H) 08/11/2017 0819   CALCIUM 9.9 10/27/2017 1103   CALCIUM 9.8 08/11/2017 0819   PROT 6.5 10/27/2017 1103   PROT 6.6 08/11/2017 0819   ALBUMIN 2.5 (L) 10/27/2017 1103   ALBUMIN 2.6 (L) 08/11/2017 0819   AST 10 10/27/2017 1103   AST 16 08/11/2017 0819   ALT 7 10/27/2017 1103   ALT 9 08/11/2017 0819   ALKPHOS 92 10/27/2017 1103   ALKPHOS 131 08/11/2017 0819   BILITOT 0.3 10/27/2017 1103   BILITOT 0.38 08/11/2017 0819   GFRNONAA 27 (L) 10/27/2017 1103   GFRAA 31 (L) 10/27/2017 1103    Lab Results  Component Value Date   CEA1 15.17 (H) 10/20/2017      Medications: I have reviewed the patient's current medications.   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 Findings consistent with a gastrointestinal or pancreatico/biliary primary.  Cycle 1 FOLFOX 06/28/2017  Cycle 2 FOLFOX 07/24/2017  Cycle 3 FOLFOX 08/18/2017  Cycle 4 FOLFOX 09/01/2017  Cycle 5 FOLFOX 09/15/2017  CTs 09/27/2017- increased ascites and omental nodularity, stable and increased pulmonary nodules, increased capsular liver deposit  Cycle 1 FOLFIRI 10/07/2017  Cycle 2 FOLFIRI 10/27/2017 (irinotecan dose reduced) Shawna Schneider appears stable.  She will complete a second cycle of FOLFIRI today.  2. 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 GI bleeding after TPA 05/24/2017-resolved  4. Bilateral lower extremity DVTs confirmed on Doppler 05/25/2017--initially onEliquis.Eliquis discontinued due to a GI bleed December 2018.  Negative bilateral lower extremity Doppler 07/17/2017  5. Small PFO  6. Cervical cancer at age 56  7. Papillary renal cell carcinoma 2006, status post left nephrectomy  8.Renal insufficiency. Followed by Dr. Moshe Cipro.  9.Port-A-Cath placement 06/23/2017  10.Left hip/leg pain. Likely related to #  1.Resolved 07/12/2017.  11.Hospitalization with GI bleed 07/17/2017 through 07/19/2017.  12.Anemia secondary to GI blood loss, renal failure, chemotherapy. Red cell transfusion 07/13/2017.  13.Right lower extremity DVT/right upper lobe pulmonary embolism1/09/2017. On Lovenox.  14.Oxaliplatin neuropathy-moderate loss of vibratory  sense 09/15/2017  15.  Neutropenia following cycle 1 FOLFIRI.  16.  Acute and delayed nausea secondary to chemotherapy.    Disposition: Shawna Schneider appears stable.  She will complete another cycle of FOLFIRI today.  Emend will be added to the antiemetic regimen.  The irinotecan will be dose reduced.  We will add Neulasta if her insurance company will approve this.  We reviewed potential toxicities associated with Neulasta and she agrees to proceed.  The tissue from the left omental mass biopsy 10/06/2017 did not reveal enough material for Foundation 1 testing.  We submitted peripheral blood Guardant testing today.  Ms. Pipkins will contact us for nausea following the cycle of chemotherapy.  She will return for an office visit and cycle 3 chemotherapy in 2 weeks.  The plan is to add Panitumumab if the guardant testing reveals no RAS mutation.  25 minutes were spent with the patient today.  The majority of the time was used for counseling and coordination of care.  Betsy Coder, MD  10/27/2017  4:08 PM

## 2017-10-27 NOTE — Patient Instructions (Addendum)
[facility] Discharge Instructions for Patients Receiving Chemotherapy  Today you received the following chemotherapy agents Irinotecan, Leucovorin, Adrucil  To help prevent nausea and vomiting after your treatment, we encourage you to take your nausea medication as directed.   If you develop nausea and vomiting that is not controlled by your nausea medication, call the clinic.   BELOW ARE SYMPTOMS THAT SHOULD BE REPORTED IMMEDIATELY:  *FEVER GREATER THAN 100.5 F  *CHILLS WITH OR WITHOUT FEVER  NAUSEA AND VOMITING THAT IS NOT CONTROLLED WITH YOUR NAUSEA MEDICATION  *UNUSUAL SHORTNESS OF BREATH  *UNUSUAL BRUISING OR BLEEDING  TENDERNESS IN MOUTH AND THROAT WITH OR WITHOUT PRESENCE OF ULCERS  *URINARY PROBLEMS  *BOWEL PROBLEMS  UNUSUAL RASH Items with * indicate a potential emergency and should be followed up as soon as possible.  Feel free to call the clinic should you have any questions or concerns. The clinic phone number is [phone].  Please show the CHEMO ALERT CARD at check-in to the Emergency Department and triage nurse.   

## 2017-10-27 NOTE — Progress Notes (Signed)
  Oncology Nurse Navigator Documentation  Navigator Location: [facility] (10/27/17 1440)   )Navigator Encounter Type: Treatment (10/27/17 1440)                         Barriers/Navigation Needs: No barriers at this time;No Questions;No Needs (10/27/17 1440)   Interventions: Psycho-social support (10/27/17 1440) Met with patient and her son in the infusion room to offer encouragement and support.  Patient shared that her appetite is "good" right now and she is enjoying eating.  Patient encouraged to with questions or concerns.            Acuity: Level 1 (10/27/17 1440)         Time Spent with Patient: 15 (10/27/17 1440)

## 2017-10-28 ENCOUNTER — Telehealth: Payer: Self-pay | Admitting: Oncology

## 2017-10-28 NOTE — Telephone Encounter (Signed)
Unable to leave message on vmail - scheduled appt per 3/20 l;os - patient to get an updated schedule when they come in for pump d/c

## 2017-10-28 NOTE — Telephone Encounter (Signed)
Unable to schedule 3/20 los - due to capped day - logged and will contact patient when appt is scheduled.

## 2017-10-29 ENCOUNTER — Inpatient Hospital Stay: Payer: BC Managed Care – PPO

## 2017-10-29 DIAGNOSIS — C186 Malignant neoplasm of descending colon: Secondary | ICD-10-CM | POA: Diagnosis not present

## 2017-10-29 DIAGNOSIS — C800 Disseminated malignant neoplasm, unspecified: Secondary | ICD-10-CM

## 2017-10-29 MED ORDER — SODIUM CHLORIDE 0.9% FLUSH
10.0000 mL | Freq: Once | INTRAVENOUS | Status: AC
  Administered 2017-10-29: 10 mL
  Filled 2017-10-29: qty 10

## 2017-10-29 MED ORDER — HEPARIN SOD (PORK) LOCK FLUSH 100 UNIT/ML IV SOLN
500.0000 [IU] | Freq: Once | INTRAVENOUS | Status: AC
  Administered 2017-10-29: 500 [IU]
  Filled 2017-10-29: qty 5

## 2017-10-29 NOTE — Patient Instructions (Signed)

## 2017-11-01 ENCOUNTER — Inpatient Hospital Stay (HOSPITAL_COMMUNITY)
Admission: EM | Admit: 2017-11-01 | Discharge: 2017-11-08 | DRG: 871 | Disposition: A | Discharge status: Home / Self Care | Payer: BC Managed Care – PPO | Attending: Family Medicine | Admitting: Family Medicine

## 2017-11-01 ENCOUNTER — Emergency Department (HOSPITAL_COMMUNITY): Payer: BC Managed Care – PPO

## 2017-11-01 ENCOUNTER — Encounter: Payer: Self-pay | Admitting: *Deleted

## 2017-11-01 ENCOUNTER — Encounter (HOSPITAL_COMMUNITY): Payer: Self-pay | Admitting: Neurology

## 2017-11-01 ENCOUNTER — Other Ambulatory Visit: Payer: Self-pay

## 2017-11-01 DIAGNOSIS — J9600 Acute respiratory failure, unspecified whether with hypoxia or hypercapnia: Secondary | ICD-10-CM | POA: Diagnosis not present

## 2017-11-01 DIAGNOSIS — Z880 Allergy status to penicillin: Secondary | ICD-10-CM

## 2017-11-01 DIAGNOSIS — C7802 Secondary malignant neoplasm of left lung: Secondary | ICD-10-CM | POA: Diagnosis present

## 2017-11-01 DIAGNOSIS — E039 Hypothyroidism, unspecified: Secondary | ICD-10-CM | POA: Diagnosis present

## 2017-11-01 DIAGNOSIS — Z79890 Hormone replacement therapy (postmenopausal): Secondary | ICD-10-CM

## 2017-11-01 DIAGNOSIS — J690 Pneumonitis due to inhalation of food and vomit: Secondary | ICD-10-CM | POA: Diagnosis present

## 2017-11-01 DIAGNOSIS — Z905 Acquired absence of kidney: Secondary | ICD-10-CM | POA: Diagnosis not present

## 2017-11-01 DIAGNOSIS — Z7189 Other specified counseling: Secondary | ICD-10-CM | POA: Diagnosis not present

## 2017-11-01 DIAGNOSIS — N189 Chronic kidney disease, unspecified: Secondary | ICD-10-CM | POA: Diagnosis present

## 2017-11-01 DIAGNOSIS — C189 Malignant neoplasm of colon, unspecified: Secondary | ICD-10-CM | POA: Diagnosis present

## 2017-11-01 DIAGNOSIS — Z86718 Personal history of other venous thrombosis and embolism: Secondary | ICD-10-CM

## 2017-11-01 DIAGNOSIS — C786 Secondary malignant neoplasm of retroperitoneum and peritoneum: Secondary | ICD-10-CM | POA: Diagnosis present

## 2017-11-01 DIAGNOSIS — N179 Acute kidney failure, unspecified: Secondary | ICD-10-CM | POA: Diagnosis present

## 2017-11-01 DIAGNOSIS — G40901 Epilepsy, unspecified, not intractable, with status epilepticus: Secondary | ICD-10-CM | POA: Diagnosis present

## 2017-11-01 DIAGNOSIS — J9602 Acute respiratory failure with hypercapnia: Secondary | ICD-10-CM | POA: Diagnosis present

## 2017-11-01 DIAGNOSIS — J9601 Acute respiratory failure with hypoxia: Secondary | ICD-10-CM | POA: Diagnosis present

## 2017-11-01 DIAGNOSIS — L8990 Pressure ulcer of unspecified site, unspecified stage: Secondary | ICD-10-CM | POA: Diagnosis present

## 2017-11-01 DIAGNOSIS — E785 Hyperlipidemia, unspecified: Secondary | ICD-10-CM | POA: Diagnosis present

## 2017-11-01 DIAGNOSIS — Z515 Encounter for palliative care: Secondary | ICD-10-CM

## 2017-11-01 DIAGNOSIS — A419 Sepsis, unspecified organism: Principal | ICD-10-CM | POA: Diagnosis present

## 2017-11-01 DIAGNOSIS — R569 Unspecified convulsions: Secondary | ICD-10-CM

## 2017-11-01 DIAGNOSIS — R4182 Altered mental status, unspecified: Secondary | ICD-10-CM

## 2017-11-01 DIAGNOSIS — D649 Anemia, unspecified: Secondary | ICD-10-CM | POA: Diagnosis present

## 2017-11-01 DIAGNOSIS — C799 Secondary malignant neoplasm of unspecified site: Secondary | ICD-10-CM | POA: Diagnosis not present

## 2017-11-01 DIAGNOSIS — Z85528 Personal history of other malignant neoplasm of kidney: Secondary | ICD-10-CM

## 2017-11-01 DIAGNOSIS — Z9289 Personal history of other medical treatment: Secondary | ICD-10-CM

## 2017-11-01 DIAGNOSIS — Z978 Presence of other specified devices: Secondary | ICD-10-CM

## 2017-11-01 DIAGNOSIS — E440 Moderate protein-calorie malnutrition: Secondary | ICD-10-CM | POA: Diagnosis present

## 2017-11-01 DIAGNOSIS — Z8673 Personal history of transient ischemic attack (TIA), and cerebral infarction without residual deficits: Secondary | ICD-10-CM

## 2017-11-01 DIAGNOSIS — Q211 Atrial septal defect: Secondary | ICD-10-CM | POA: Diagnosis not present

## 2017-11-01 DIAGNOSIS — I129 Hypertensive chronic kidney disease with stage 1 through stage 4 chronic kidney disease, or unspecified chronic kidney disease: Secondary | ICD-10-CM | POA: Diagnosis present

## 2017-11-01 DIAGNOSIS — C7801 Secondary malignant neoplasm of right lung: Secondary | ICD-10-CM | POA: Diagnosis present

## 2017-11-01 DIAGNOSIS — R188 Other ascites: Secondary | ICD-10-CM | POA: Diagnosis present

## 2017-11-01 DIAGNOSIS — Z7952 Long term (current) use of systemic steroids: Secondary | ICD-10-CM

## 2017-11-01 DIAGNOSIS — Z79899 Other long term (current) drug therapy: Secondary | ICD-10-CM

## 2017-11-01 HISTORY — DX: Unspecified convulsions: R56.9

## 2017-11-01 HISTORY — DX: Cerebral infarction, unspecified: I63.9

## 2017-11-01 LAB — CORTISOL: CORTISOL PLASMA: 16.9 ug/dL

## 2017-11-01 LAB — COMPREHENSIVE METABOLIC PANEL
ALBUMIN: 2.6 g/dL — AB (ref 3.5–5.0)
ALT: 13 U/L — ABNORMAL LOW (ref 14–54)
ANION GAP: 21 — AB (ref 5–15)
AST: 41 U/L (ref 15–41)
Alkaline Phosphatase: 98 U/L (ref 38–126)
BUN: 22 mg/dL — ABNORMAL HIGH (ref 6–20)
CO2: 9 mmol/L — AB (ref 22–32)
Calcium: 9.5 mg/dL (ref 8.9–10.3)
Chloride: 110 mmol/L (ref 101–111)
Creatinine, Ser: 2.01 mg/dL — ABNORMAL HIGH (ref 0.44–1.00)
GFR calc Af Amer: 26 mL/min — ABNORMAL LOW (ref >60)
GFR calc non Af Amer: 22 mL/min — ABNORMAL LOW (ref >60)
GLUCOSE: 145 mg/dL — AB (ref 65–99)
POTASSIUM: 3.5 mmol/L (ref 3.5–5.1)
SODIUM: 140 mmol/L (ref 135–145)
TOTAL PROTEIN: 6 g/dL — AB (ref 6.5–8.1)
Total Bilirubin: 0.3 mg/dL (ref 0.3–1.2)

## 2017-11-01 LAB — CBC WITH DIFFERENTIAL/PLATELET
BASOS ABS: 0 10*3/uL (ref 0.0–0.1)
BASOS PCT: 0 %
EOS ABS: 0 10*3/uL (ref 0.0–0.7)
Eosinophils Relative: 0 %
HCT: 29 % — ABNORMAL LOW (ref 36.0–46.0)
HEMOGLOBIN: 8.7 g/dL — AB (ref 12.0–15.0)
LYMPHS ABS: 3.8 10*3/uL (ref 0.7–4.0)
Lymphocytes Relative: 41 %
MCH: 26.1 pg (ref 26.0–34.0)
MCHC: 30 g/dL (ref 30.0–36.0)
MCV: 87.1 fL (ref 78.0–100.0)
Monocytes Absolute: 0.1 10*3/uL (ref 0.1–1.0)
Monocytes Relative: 1 %
NEUTROS PCT: 58 %
Neutro Abs: 5.5 10*3/uL (ref 1.7–7.7)
PLATELETS: 427 10*3/uL — AB (ref 150–400)
RBC: 3.33 MIL/uL — AB (ref 3.87–5.11)
RDW: 20.3 % — ABNORMAL HIGH (ref 11.5–15.5)
WBC: 9.4 10*3/uL (ref 4.0–10.5)

## 2017-11-01 LAB — I-STAT TROPONIN, ED: Troponin i, poc: 0.03 ng/mL (ref 0.00–0.08)

## 2017-11-01 LAB — URINALYSIS, ROUTINE W REFLEX MICROSCOPIC
Bilirubin Urine: NEGATIVE
GLUCOSE, UA: NEGATIVE mg/dL
Ketones, ur: NEGATIVE mg/dL
LEUKOCYTES UA: NEGATIVE
NITRITE: NEGATIVE
PH: 5 (ref 5.0–8.0)
PROTEIN: 100 mg/dL — AB
Specific Gravity, Urine: 1.016 (ref 1.005–1.030)

## 2017-11-01 LAB — PROCALCITONIN: Procalcitonin: 4.82 ng/mL

## 2017-11-01 LAB — I-STAT ARTERIAL BLOOD GAS, ED
Acid-base deficit: 19 mmol/L — ABNORMAL HIGH (ref 0.0–2.0)
BICARBONATE: 8.7 mmol/L — AB (ref 20.0–28.0)
O2 Saturation: 99 %
PO2 ART: 188 mmHg — AB (ref 83.0–108.0)
TCO2: 10 mmol/L — ABNORMAL LOW (ref 22–32)
pCO2 arterial: 27.6 mmHg — ABNORMAL LOW (ref 32.0–48.0)
pH, Arterial: 7.104 — CL (ref 7.350–7.450)

## 2017-11-01 LAB — POCT I-STAT 3, ART BLOOD GAS (G3+)
Acid-base deficit: 8 mmol/L — ABNORMAL HIGH (ref 0.0–2.0)
Bicarbonate: 16.3 mmol/L — ABNORMAL LOW (ref 20.0–28.0)
O2 Saturation: 97 %
Patient temperature: 37.6
TCO2: 17 mmol/L — ABNORMAL LOW (ref 22–32)
pCO2 arterial: 30 mmHg — ABNORMAL LOW (ref 32.0–48.0)
pH, Arterial: 7.345 — ABNORMAL LOW (ref 7.350–7.450)
pO2, Arterial: 99 mmHg (ref 83.0–108.0)

## 2017-11-01 LAB — LACTIC ACID, PLASMA: LACTIC ACID, VENOUS: 1.8 mmol/L (ref 0.5–1.9)

## 2017-11-01 LAB — RENAL FUNCTION PANEL
Albumin: 2.2 g/dL — ABNORMAL LOW (ref 3.5–5.0)
Anion gap: 11 (ref 5–15)
BUN: 23 mg/dL — AB (ref 6–20)
CALCIUM: 8.6 mg/dL — AB (ref 8.9–10.3)
CO2: 17 mmol/L — AB (ref 22–32)
CREATININE: 1.79 mg/dL — AB (ref 0.44–1.00)
Chloride: 112 mmol/L — ABNORMAL HIGH (ref 101–111)
GFR calc non Af Amer: 26 mL/min — ABNORMAL LOW (ref >60)
GFR, EST AFRICAN AMERICAN: 30 mL/min — AB (ref >60)
GLUCOSE: 123 mg/dL — AB (ref 65–99)
Phosphorus: 2.7 mg/dL (ref 2.5–4.6)
Potassium: 3.4 mmol/L — ABNORMAL LOW (ref 3.5–5.1)
SODIUM: 140 mmol/L (ref 135–145)

## 2017-11-01 LAB — TSH: TSH: 2.064 u[IU]/mL (ref 0.350–4.500)

## 2017-11-01 LAB — HEMOGLOBIN AND HEMATOCRIT, BLOOD
HEMATOCRIT: 26.4 % — AB (ref 36.0–46.0)
HEMOGLOBIN: 8.1 g/dL — AB (ref 12.0–15.0)

## 2017-11-01 LAB — ETHANOL

## 2017-11-01 LAB — TYPE AND SCREEN
ABO/RH(D): O POS
ANTIBODY SCREEN: NEGATIVE

## 2017-11-01 LAB — TRIGLYCERIDES: Triglycerides: 223 mg/dL — ABNORMAL HIGH (ref <150)

## 2017-11-01 LAB — I-STAT CG4 LACTIC ACID, ED: Lactic Acid, Venous: 11.24 mmol/L (ref 0.5–1.9)

## 2017-11-01 LAB — PROTIME-INR
INR: 1.14
PROTHROMBIN TIME: 14.5 s (ref 11.4–15.2)

## 2017-11-01 LAB — CK: Total CK: 100 U/L (ref 38–234)

## 2017-11-01 LAB — MAGNESIUM: Magnesium: 1 mg/dL — ABNORMAL LOW (ref 1.7–2.4)

## 2017-11-01 LAB — ACETAMINOPHEN LEVEL

## 2017-11-01 LAB — SALICYLATE LEVEL

## 2017-11-01 LAB — GLUCOSE, CAPILLARY: Glucose-Capillary: 117 mg/dL — ABNORMAL HIGH (ref 65–99)

## 2017-11-01 MED ORDER — LORAZEPAM 2 MG/ML IJ SOLN
INTRAMUSCULAR | Status: AC
  Administered 2017-11-01: 2 mg
  Filled 2017-11-01: qty 1

## 2017-11-01 MED ORDER — AZTREONAM 1 G IJ SOLR
1.0000 g | Freq: Three times a day (TID) | INTRAMUSCULAR | Status: DC
  Filled 2017-11-01: qty 1

## 2017-11-01 MED ORDER — SODIUM CHLORIDE 0.9 % IV SOLN
INTRAVENOUS | Status: DC
  Administered 2017-11-01 – 2017-11-03 (×5): via INTRAVENOUS

## 2017-11-01 MED ORDER — ETOMIDATE 2 MG/ML IV SOLN
0.3000 mg/kg | Freq: Once | INTRAVENOUS | Status: AC
  Administered 2017-11-01: 21.48 mg via INTRAVENOUS

## 2017-11-01 MED ORDER — VANCOMYCIN HCL IN DEXTROSE 1-5 GM/200ML-% IV SOLN: 1000.0000 mg | Freq: Once | INTRAVENOUS | Status: DC

## 2017-11-01 MED ORDER — SODIUM CHLORIDE 0.9 % IV SOLN: 250.0000 mL | INTRAVENOUS | Status: DC | PRN

## 2017-11-01 MED ORDER — SODIUM CHLORIDE 0.9 % IV BOLUS
1000.0000 mL | Freq: Once | INTRAVENOUS | Status: AC
  Administered 2017-11-01: 1000 mL via INTRAVENOUS

## 2017-11-01 MED ORDER — VANCOMYCIN HCL IN DEXTROSE 750-5 MG/150ML-% IV SOLN: 750.0000 mg | INTRAVENOUS | Status: DC

## 2017-11-01 MED ORDER — SODIUM CHLORIDE 0.9 % IV SOLN
1.0000 g | INTRAVENOUS | Status: DC
  Administered 2017-11-01: 1 g via INTRAVENOUS
  Filled 2017-11-01: qty 10

## 2017-11-01 MED ORDER — LEVETIRACETAM IN NACL 500 MG/100ML IV SOLN
500.0000 mg | Freq: Two times a day (BID) | INTRAVENOUS | Status: DC
  Administered 2017-11-02 – 2017-11-03 (×3): 500 mg via INTRAVENOUS
  Filled 2017-11-01 (×4): qty 100

## 2017-11-01 MED ORDER — PROPOFOL 1000 MG/100ML IV EMUL
0.0000 ug/kg/min | INTRAVENOUS | Status: DC
  Administered 2017-11-01: 30 ug/kg/min via INTRAVENOUS
  Filled 2017-11-01: qty 100

## 2017-11-01 MED ORDER — FENTANYL 2500MCG IN NS 250ML (10MCG/ML) PREMIX INFUSION
0.0000 ug/h | INTRAVENOUS | Status: DC
  Administered 2017-11-01: 50 ug/h via INTRAVENOUS
  Administered 2017-11-02: 100 ug/h via INTRAVENOUS
  Filled 2017-11-01 (×2): qty 250

## 2017-11-01 MED ORDER — LEVOFLOXACIN IN D5W 750 MG/150ML IV SOLN
750.0000 mg | Freq: Once | INTRAVENOUS | Status: AC
  Administered 2017-11-01: 750 mg via INTRAVENOUS
  Filled 2017-11-01: qty 150

## 2017-11-01 MED ORDER — PROPOFOL 1000 MG/100ML IV EMUL
INTRAVENOUS | Status: AC
  Filled 2017-11-01: qty 100

## 2017-11-01 MED ORDER — PROPOFOL 1000 MG/100ML IV EMUL
5.0000 ug/kg/min | Freq: Once | INTRAVENOUS | Status: AC
  Administered 2017-11-01: 10 ug/kg/min via INTRAVENOUS

## 2017-11-01 MED ORDER — PANTOPRAZOLE SODIUM 40 MG IV SOLR
40.0000 mg | Freq: Every day | INTRAVENOUS | Status: DC
  Administered 2017-11-01 – 2017-11-02 (×2): 40 mg via INTRAVENOUS
  Filled 2017-11-01 (×2): qty 40

## 2017-11-01 MED ORDER — SUCCINYLCHOLINE CHLORIDE 20 MG/ML IJ SOLN
2.0000 mg/kg | Freq: Once | INTRAMUSCULAR | Status: AC
  Administered 2017-11-01: 143.2 mg via INTRAVENOUS
  Filled 2017-11-01: qty 7.16

## 2017-11-01 MED ORDER — MAGNESIUM SULFATE 2 GM/50ML IV SOLN
2.0000 g | Freq: Once | INTRAVENOUS | Status: AC
  Administered 2017-11-01: 2 g via INTRAVENOUS
  Filled 2017-11-01: qty 50

## 2017-11-01 MED ORDER — FENTANYL CITRATE (PF) 100 MCG/2ML IJ SOLN
50.0000 ug | INTRAMUSCULAR | Status: DC | PRN
  Administered 2017-11-02 – 2017-11-04 (×6): 50 ug via INTRAVENOUS

## 2017-11-01 MED ORDER — FENTANYL CITRATE (PF) 100 MCG/2ML IJ SOLN
INTRAMUSCULAR | Status: AC
  Filled 2017-11-01: qty 2

## 2017-11-01 MED ORDER — VANCOMYCIN HCL 10 G IV SOLR
1250.0000 mg | Freq: Once | INTRAVENOUS | Status: AC
  Administered 2017-11-01: 1250 mg via INTRAVENOUS
  Filled 2017-11-01: qty 1250

## 2017-11-01 MED ORDER — FENTANYL CITRATE (PF) 100 MCG/2ML IJ SOLN
100.0000 ug | Freq: Once | INTRAMUSCULAR | Status: AC
  Administered 2017-11-01: 100 ug via INTRAVENOUS

## 2017-11-01 MED ORDER — LEVOFLOXACIN IN D5W 500 MG/100ML IV SOLN: 500.0000 mg | INTRAVENOUS | Status: DC

## 2017-11-01 MED ORDER — FENTANYL CITRATE (PF) 100 MCG/2ML IJ SOLN
50.0000 ug | INTRAMUSCULAR | Status: DC | PRN
  Administered 2017-11-01: 50 ug via INTRAVENOUS
  Filled 2017-11-01: qty 2

## 2017-11-01 MED ORDER — LACTATED RINGERS IV BOLUS
1000.0000 mL | Freq: Once | INTRAVENOUS | Status: AC
  Administered 2017-11-01: 1000 mL via INTRAVENOUS

## 2017-11-01 MED ORDER — LEVETIRACETAM IN NACL 1000 MG/100ML IV SOLN
1000.0000 mg | Freq: Once | INTRAVENOUS | Status: AC
  Administered 2017-11-01: 1000 mg via INTRAVENOUS
  Filled 2017-11-01: qty 100

## 2017-11-01 MED ORDER — SODIUM CHLORIDE 0.9 % IV SOLN
2.0000 g | Freq: Once | INTRAVENOUS | Status: AC
  Administered 2017-11-01: 2 g via INTRAVENOUS
  Filled 2017-11-01: qty 2

## 2017-11-01 MED ORDER — LEVOTHYROXINE SODIUM 100 MCG IV SOLR
37.5000 ug | Freq: Every day | INTRAVENOUS | Status: DC
  Administered 2017-11-02: 37.5 ug via INTRAVENOUS
  Filled 2017-11-01: qty 5

## 2017-11-01 NOTE — Progress Notes (Signed)
Pt's gold ring removed and given to [employer] (son) at bedside in 2M03

## 2017-11-01 NOTE — Progress Notes (Signed)
RT note: RT transported patient to CT and back to ED. Vital signs stable at this time.

## 2017-11-01 NOTE — Procedures (Signed)
  ELECTROENCEPHALOGRAM REPORT  Date of Study: 11/01/17  Patient's Name: Shawna Schneider MRN: [id number] Date of Birth: 01/18/1935  Referring Provider: Samara Snide, MD  Clinical History: Shawna Schneider is a 83 y.o. female presented to the hospital status post 2 back-to-back tonic-clonic seizures.  Noted history of intracranial stroke however has never had seizures in the past.  In addition patient is a cancer patient with multiple metastases.   While in the ED 1 g of Keppra has been administered.  Head CT with atrophy and chronic ischemic changes.     Medications: Scheduled Meds: . fentaNYL      . [START ON 11/02/2017] levothyroxine  37.5 mcg Intravenous Daily  . pantoprazole (PROTONIX) IV  40 mg Intravenous QHS   Continuous Infusions: . sodium chloride    . sodium chloride 125 mL/hr at 11/01/17 1906  . cefTRIAXone (ROCEPHIN)  IV    . levETIRAcetam Stopped (11/01/17 1809)  . propofol    . propofol (DIPRIVAN) infusion     PRN Meds:.sodium chloride, fentaNYL (SUBLIMAZE) injection, fentaNYL (SUBLIMAZE) injection            Technical Summary: This is a standard 16 channel EEG recording performed according to the international 10-20 electrode system.  AP bipolar, transverse bipolar, and referential montages were obtained, and digitally reformatted as necessary.  Duration of tracing: 22:30  Description: Pt is intubated/sedated (Propofol) and noted to be unresponsive to commands.  Background is notable for diffusely low amplitude with frontally predominant, but generalized slowing of 1-2 Hz frequency.  Superimposed muscle/ beta frequencies consistent with polymorphic delta architecture.  No well defined posterior rhythm is noted, and no well defined wake, drowsy, or sleep states.  She is noted to withdraw to pain.  Neither hyperventilation or photic stimulation were performed.  EKG was monitored and noted to be sinus rhythym with an average heart rate of 72 bpm.  No  epileptiform changes were noted.  Impression: This is an abnormal EEG due to persistent background slowing/disorganization seen throughout the tracing.  This is a non-specific finding that can be seen with toxic, metabolic, diffuse, or multifocal structural processes.  No definite epileptiform changes were noted.   A single EEG without epileptiform changes does not exclude the diagnosis of epilepsy. Clinical correlation advised.   Carvel Getting, M.D. Neurology Cell [phone]

## 2017-11-01 NOTE — Progress Notes (Signed)
Pt transferred to 2M3 w/o complications. Uneventful trip. Family wanted me to disregard cut clothing. ER RN witness.

## 2017-11-01 NOTE — ED Notes (Signed)
I stat lactic acid reported to Dr. Rex Kras by B. Yolanda Bonine, EMT

## 2017-11-01 NOTE — ED Notes (Signed)
Neurology at bedside.

## 2017-11-01 NOTE — Progress Notes (Addendum)
Pharmacy Antibiotic Note  Shawna Schneider is a 83 y.o. female admitted on 11/01/2017 with AMS, found unresponsive with agonal breathing.  Pharmacy has been consulted for vancomycin, aztreonam, and levofloxacin dosing for sepsis.  Patient has a history of solitary kidney.  SCr 2.01, CrCL 21 ml/min, WBC WNL, LA 11.24.   Plan: Vanc 1250mg  IV x 1, then 750mg  IV Q24H Azactam 2gm IV x 1, then 1gm IV Q8H Levaquin 750mg  IV x 1, then 500mg  IV Q48H Monitor renal fxn, clinical progress, vanc trough as indicated  F/U with continuing Lovenox for hx VTEs   Height: 5\' 3"  (160 cm) IBW/kg (Calculated) : 52.4  No data recorded.  Recent Labs  Lab 10/27/17 1103 11/01/17 1533 11/01/17 1545  WBC 7.8 9.4  --   CREATININE 1.71* 2.01*  --   LATICACIDVEN  --   --  11.24*    Estimated Creatinine Clearance: 21.2 mL/min (A) (by C-G formula based on SCr of 2.01 mg/dL (H)).    Allergies  Allergen Reactions  . Penicillins Other (See Comments)    Has patient had a PCN reaction causing immediate rash, facial/tongue/throat swelling, SOB or lightheadedness with hypotension: unkn Has patient had a PCN reaction causing severe rash involving mucus membranes or skin necrosis: unkn Has patient had a PCN reaction that required hospitalization: unkn Has patient had a PCN reaction occurring within the last 10 years: unkn If all of the above answers are "NO", then may proceed with Cephalosporin use.      Vanc 3/25 >> Azactam 3/25 >> LVQ 3/25 >>  3/25 BCx -  3/25 UCx -    [name] D. Mina Marble, PharmD, BCPS Pager:  [phone] 11/01/2017, 4:56 PM  =========================================   Addendum: Narrow to Rocephin for aspiration Rocephin 1gm IV Q24H Pharmacy will sign off and follow peripherally.  Thank you for the consult! F/U tolerance to Rocephin   [name] D. Mina Marble, PharmD, BCPS Pager:  [phone] 11/01/2017, 7:02 PM

## 2017-11-01 NOTE — ED Notes (Signed)
EEG in room 

## 2017-11-01 NOTE — Code Documentation (Signed)
Pt observed to be seizing.

## 2017-11-01 NOTE — ED Provider Notes (Signed)
[facility] EMERGENCY DEPARTMENT Provider Note   CSN: [id number] Arrival date & time: 11/01/17  1522     History   Chief Complaint Chief Complaint  Patient presents with  . Altered Mental Status    HPI Shawna Schneider is a 83 y.o. female.  The history is provided by the EMS personnel.  Altered Mental Status   This is a new problem. Episode onset: unknown, LKN 1600 yesterday. The problem has been gradually improving. Associated symptoms include unresponsiveness. Her past medical history is significant for hypertension. Her past medical history does not include seizures. Past medical history comments: metastatic colon CA.  EMS brings the patient to the ED for altered mental status.  Patient was found unresponsive with agonal breathing by the son today.  The son stops by every day to give her Lovenox shots for history of DVT/PE.  Patient's head was repositioned by EMS and they gave bag valve mask ventilations which improved her breathing and mental status.  Patient is still unresponsive but was moving all 4 extremities but not purposefully.  Patient yesterday around 4 PM was reportedly normal.  Past Medical History:  Diagnosis Date  . Cancer ([place])   . Hyperlipemia   . Hypertension   . Seizures ([place]) 11/01/2017   no hx before today  . Solitary kidney   . Stroke [facility]) 05/2017   pt received TPA and has no deficits    Patient Active Problem List   Diagnosis Date Noted  . Seizure ([place]) 11/01/2017  . Upper GI bleed 07/17/2017  . Adenocarcinoma carcinomatosis ([place]) 06/16/2017  . Goals of care, counseling/discussion 06/16/2017  . Metastasis from colon cancer ([place])   . Acute deep vein thrombosis (DVT) of distal vein of both lower extremities (HCC)   . Gastrointestinal hemorrhage   . Malignant neoplasm of descending colon ([place])   . Acute ischemic stroke ([place]) 05/24/2017    Past Surgical History:  Procedure Laterality Date  . COLONOSCOPY WITH PROPOFOL N/A  05/28/2017   Procedure: COLONOSCOPY WITH PROPOFOL;  Surgeon: Milus Banister, MD;  Location: [facility] ENDOSCOPY;  Service: Endoscopy;  Laterality: N/A;  . ESOPHAGOGASTRODUODENOSCOPY N/A 05/28/2017   Procedure: ESOPHAGOGASTRODUODENOSCOPY (EGD);  Surgeon: Milus Banister, MD;  Location: [facility] ENDOSCOPY;  Service: Endoscopy;  Laterality: N/A;  . IR FLUORO GUIDE PORT INSERTION RIGHT  06/23/2017  . IR US GUIDE VASC ACCESS RIGHT  06/23/2017  . KIDNEY SURGERY       OB History   None      Home Medications    Prior to Admission medications   Medication Sig Start Date End Date Taking? Authorizing Provider  acetaminophen (TYLENOL) 500 MG tablet Take 1,000 mg every 8 (eight) hours as needed by mouth for moderate pain.    [provider]  allopurinol (ZYLOPRIM) 100 MG tablet Take 100 mg by mouth daily. 02/25/17   [provider]  dexamethasone (DECADRON) 4 MG tablet Take 4 mg twice a day for 3 days 10/20/17   Owens Shark, NP  diltiazem (CARTIA XT) 180 MG 24 hr capsule Take 180 mg by mouth daily.    [provider]  enoxaparin (LOVENOX) 80 MG/0.8ML injection Inject 0.8 mLs (80 mg total) into the skin daily. 08/18/17   Owens Shark, NP  HYDROcodone-acetaminophen (NORCO) 5-325 MG tablet Take 0.5-1 tablets every 6 (six) hours as needed by mouth for moderate pain. 06/28/17   Owens Shark, NP  levothyroxine (SYNTHROID, LEVOTHROID) 75 MCG tablet Take 75 mcg by mouth  daily. 04/16/17   [provider]  lidocaine-prilocaine (EMLA) cream Apply to port site one hour prior to use. Do not rub in. Cover with plastic. 06/16/17   Ladell Pier, MD  loperamide (IMODIUM) 2 MG capsule Take 2 mg by mouth as needed for diarrhea or loose stools.    [provider]  metoCLOPramide (REGLAN) 5 MG tablet Take 1 tablet (5 mg total) by mouth 3 (three) times daily before meals. 09/15/17   Ladell Pier, MD  ondansetron (ZOFRAN) 8 MG tablet Take 1 tablet (8 mg total) by mouth every 8  (eight) hours as needed for nausea or vomiting. Begin 3 days after chemo. 09/15/17   Ladell Pier, MD  prednisoLONE acetate (PRED FORTE) 1 % ophthalmic suspension Place 1 drop into the left eye daily.  03/19/17   [provider]  rosuvastatin (CRESTOR) 20 MG tablet Take 5 mg daily by mouth. Pt breaks meds into 1/4    [provider]    Family History Family History  Problem Relation Age of Onset  . Hypertension Mother   . Hypertension Father     Social History Social History   Tobacco Use  . Smoking status: Never Smoker  . Smokeless tobacco: Never Used  Substance Use Topics  . Alcohol use: No  . Drug use: No     Allergies   Penicillins   Review of Systems Review of Systems  Unable to perform ROS: Patient unresponsive     Physical Exam Updated Vital Signs BP 120/79   Pulse (!) 129   Temp 98.6 F (37 C)   Resp 15   Ht _0  (1.6 m)   Wt 71.2 kg (157 lb)   SpO2 99%   BMI 27.81 kg/m   Physical Exam  Constitutional: She appears well-developed and well-nourished. She appears distressed.  HENT:  Head: Normocephalic and atraumatic.  Thick mucous oropharynx  Eyes: Conjunctivae are normal.  Eyes deviated to R, L pupil 39m, R pupil 463mreactive sluggishly  Neck: Neck supple.  Cardiovascular: Regular rhythm and intact distal pulses. Tachycardia present.  Pulmonary/Chest: She has rales (RLL).  Irregular breathing  Abdominal: Soft. She exhibits no distension. There is no tenderness. There is no guarding.  Musculoskeletal: She exhibits no edema.  Neurological: She is unresponsive. She displays seizure activity. GCS eye subscore is 1. GCS verbal subscore is 1. GCS motor subscore is 6.  Tonic-clonic generalized seizure activity lasting about 2 minutes.  Withdraws to pain in all 4 extremity's.  Only follows commands with left upper extremity.  Skin: Skin is warm and dry.  Psychiatric: She has a normal mood and affect.  Nursing note and vitals  reviewed.    ED Treatments / Results  Labs (all labs ordered are listed, but only abnormal results are displayed) Labs Reviewed  COMPREHENSIVE METABOLIC PANEL - Abnormal; Notable for the following components:      Result Value   CO2 9 (*)    Glucose, Bld 145 (*)    BUN 22 (*)    Creatinine, Ser 2.01 (*)    Total Protein 6.0 (*)    Albumin 2.6 (*)    ALT 13 (*)    GFR calc non Af Amer 22 (*)    GFR calc Af Amer 26 (*)    Anion gap 21 (*)    All other components within normal limits  CBC WITH DIFFERENTIAL/PLATELET - Abnormal; Notable for the following components:   RBC 3.33 (*)    Hemoglobin  8.7 (*)    HCT 29.0 (*)    RDW 20.3 (*)    Platelets 427 (*)    All other components within normal limits  ACETAMINOPHEN LEVEL - Abnormal; Notable for the following components:   Acetaminophen (Tylenol), Serum <10 (*)    All other components within normal limits  I-STAT ARTERIAL BLOOD GAS, ED - Abnormal; Notable for the following components:   pH, Arterial 7.104 (*)    pCO2 arterial 27.6 (*)    pO2, Arterial 188.0 (*)    Bicarbonate 8.7 (*)    TCO2 10 (*)    Acid-base deficit 19.0 (*)    All other components within normal limits  I-STAT CG4 LACTIC ACID, ED - Abnormal; Notable for the following components:   Lactic Acid, Venous 11.24 (*)    All other components within normal limits  URINE CULTURE  CULTURE, BLOOD (ROUTINE X 2)  CULTURE, BLOOD (ROUTINE X 2)  PROTIME-INR  ETHANOL  SALICYLATE LEVEL  URINALYSIS, ROUTINE W REFLEX MICROSCOPIC  BLOOD GAS, ARTERIAL  CBC  BASIC METABOLIC PANEL  MAGNESIUM  PHOSPHORUS  BLOOD GAS, ARTERIAL  TRIGLYCERIDES  TSH  I-STAT TROPONIN, ED  I-STAT CG4 LACTIC ACID, ED  TYPE AND SCREEN    EKG EKG Interpretation  Date/Time:  Monday November 01 2017 15:49:57 EDT Ventricular Rate:  128 PR Interval:    QRS Duration: 88 QT Interval:  312 QTC Calculation: 456 R Axis:   71 Text Interpretation:  Sinus tachycardia Atrial premature complex  Borderline low voltage, extremity leads tachycardia new from previous Confirmed by Theotis Burrow [phone]) on 11/01/2017 3:55:54 PM   Radiology Ct Head Wo Contrast  Result Date: 11/01/2017 CLINICAL DATA:  Focal neuro deficit suspect stroke EXAM: CT HEAD WITHOUT CONTRAST TECHNIQUE: Contiguous axial images were obtained from the base of the skull through the vertex without intravenous contrast. COMPARISON:  CT head 05/26/2015 FINDINGS: Brain: Moderate atrophy. Negative for hydrocephalus. Hypodensity left parietal lobe compatible with chronic infarct which has occurred since the prior study. Mild chronic microvascular ischemic change in the white matter. Negative for acute infarct, hemorrhage, or mass. Vascular: Negative for hyperdense vessel Skull: Negative Sinuses/Orbits: Bilateral cataract removal. Paranasal sinuses clear. Other: None IMPRESSION: Atrophy and chronic ischemia including chronic infarct left parietal lobe. No acute abnormality. Electronically Signed   By: Franchot Gallo M.D.   On: 11/01/2017 16:49   Dg Chest Port 1 View  Result Date: 11/01/2017 CLINICAL DATA:  Altered mental status EXAM: PORTABLE CHEST 1 VIEW COMPARISON:  08/11/2017 FINDINGS: Right Port-A-Cath remains in place, unchanged. Endotracheal tube is 3.2 cm above the carina. NG tube is in the stomach. Mild elevation of the right hemidiaphragm. Right lower lobe airspace opacity concerning for consolidation. Rounded masses and nodules noted in both lungs, similar to prior study compatible with metastatic disease. Left base atelectasis or early infiltrate. No visible effusions. IMPRESSION: Bilateral pulmonary nodules and masses compatible with metastases. Or confluent opacity now present in the right lung base concerning for consolidation/pneumonia. Left base atelectasis or early infiltrate. Endotracheal tube 3.2 cm above the carina. Electronically Signed   By: Rolm Baptise M.D.   On: 11/01/2017 16:17    Procedures Procedure Name:  Intubation Date/Time: 11/01/2017 5:18 PM Performed by: Tobie Poet, DO Pre-anesthesia Checklist: Patient identified, Emergency Drugs available, Suction available and Patient being monitored Oxygen Delivery Method: Non-rebreather mask Preoxygenation: Pre-oxygenation with 100% oxygen Induction Type: Rapid sequence Ventilation: Mask ventilation without difficulty Laryngoscope Size: Mac, 3 and Glidescope Grade View: Grade I Tube type: Subglottic  suction tube Tube size: 7.0 mm Number of attempts: 1 Airway Equipment and Method: Patient positioned with wedge pillow,  Stylet and Video-laryngoscopy Placement Confirmation: ETT inserted through vocal cords under direct vision,  CO2 detector and Breath sounds checked- equal and bilateral Secured at: 22 cm Tube secured with: ETT holder      (including critical care time)  Medications Ordered in ED Medications  propofol (DIPRIVAN) 1000 MG/100ML infusion (has no administration in time range)  fentaNYL (SUBLIMAZE) 100 MCG/2ML injection (has no administration in time range)  levETIRAcetam (KEPPRA) IVPB 500 mg/100 mL premix (0 mg Intravenous Hold 11/01/17 1812)  0.9 %  sodium chloride infusion (has no administration in time range)  pantoprazole (PROTONIX) injection 40 mg (has no administration in time range)  0.9 %  sodium chloride infusion ( Intravenous New Bag/Given 11/01/17 1906)  sodium chloride 0.9 % bolus 1,000 mL (1,000 mLs Intravenous Given 11/01/17 1830)  fentaNYL (SUBLIMAZE) injection 50 mcg (has no administration in time range)  fentaNYL (SUBLIMAZE) injection 50 mcg (has no administration in time range)  propofol (DIPRIVAN) 1000 MG/100ML infusion (0 mcg/kg/min  71.2 kg Intravenous Not Given 11/01/17 1900)  cefTRIAXone (ROCEPHIN) 1 g in sodium chloride 0.9 % 100 mL IVPB (has no administration in time range)  levothyroxine (SYNTHROID, LEVOTHROID) injection 37.5 mcg (has no administration in time range)  etomidate (AMIDATE) injection 21.48 mg  (21.48 mg Intravenous Given 11/01/17 1534)  succinylcholine (ANECTINE) injection 143.2 mg (143.2 mg Intravenous Given 11/01/17 1534)  sodium chloride 0.9 % bolus 1,000 mL (1,000 mLs Intravenous Given 11/01/17 1534)  LORazepam (ATIVAN) 2 MG/ML injection (2 mg  Given 11/01/17 1532)  levETIRAcetam (KEPPRA) IVPB 1000 mg/100 mL premix (0 mg Intravenous Stopped 11/01/17 1632)  propofol (DIPRIVAN) 1000 MG/100ML infusion (25 mcg/kg/min  71.6 kg Intravenous Rate/Dose Change 11/01/17 1907)  fentaNYL (SUBLIMAZE) injection 100 mcg (100 mcg Intravenous Given 11/01/17 1615)  levofloxacin (LEVAQUIN) IVPB 750 mg (0 mg Intravenous Stopped 11/01/17 1845)  aztreonam (AZACTAM) 2 g in sodium chloride 0.9 % 100 mL IVPB (0 g Intravenous Stopped 11/01/17 1743)  vancomycin (VANCOCIN) 1,250 mg in sodium chloride 0.9 % 250 mL IVPB (0 mg Intravenous Stopped 11/01/17 1834)  lactated ringers bolus 1,000 mL (1,000 mLs Intravenous Given 11/01/17 1804)     Initial Impression / Assessment and Plan / ED Course  I have reviewed the triage vital signs and the nursing notes.  Pertinent labs & imaging results that were available during my care of the patient were reviewed by me and considered in my medical decision making (see chart for details).     Patient is a 83 year old female with history of stroke, cancer (metastatic colon cancer (, hypertension, hyperlipidemia who presents with altered mental status.  Patient reportedly had a seizure at home and was found down next to her bed.  Son described as generalized shaking.  On arrival she had eye deviation to the right and had a irregular breathing.  She was following commands with the left upper extremity only but did withdraw to pain in all 4 extremities.  Shortly after arrival she had another generalized tonic clonic seizure lasting about 2 minutes.  Seizures stopped with 2 mg of Ativan.  Patient loaded with Keppra.  Patient was intubated for status epilepticus, low GCS and airway  protection.  Procedure note above.  IV fluids started for tachycardia and soft blood pressures.  Blood sugar within normal limits.  Initial concern is for a devastating intracranial process such as intracranial bleed, metastatic lesion to the  brain with vasogenic edema.  Stat head CT obtained which actually showed no acute process.  No further seizures witnessed in the ED.  Patient is found to have a right lower lobe pneumonia likely secondary to aspiration.  Patient started on broad-spectrum antibiotics and was resuscitated.  Initial lactic acid was 11, however this is likely mostly secondary to seizures.  Initial pH was 7.1 also likely secondary to lactic acidosis.  Neurology consulted for status epilepticus.  They came to the ED to evaluate the patient.  Intensivist was consulted for ICU admission.  Final Clinical Impressions(s) / ED Diagnoses   Final diagnoses:  Status epilepticus ([place])  Altered mental status, unspecified altered mental status type    ED Discharge Orders    None       Tobie Poet, DO 11/01/17 1918    Rex Kras Wenda Overland, MD 11/04/17 2119

## 2017-11-01 NOTE — Progress Notes (Signed)
[facility] Progress Note Patient Name: ILISHA BLUST DOB: 08-29-1934 MRN: [id number]   Date of Service  11/01/2017  HPI/Events of Note  Multiple issues: 1. Mg++ = 1.0 and Creatinine = 1.79, 2. Oliguria and Sinus Tachycardia and 3. Triglyceride level = 225 - Patient is on a Propofol IV infusion.   eICU Interventions  Will order: 1. Replace Mg++. 2. Bolus with 0.9 NaCl 1 liter IV over 1 hour now.  3. D/C Propofol IV infusion. 4. Fentanyl IV infusion. Titrate to RASS = 0 to -1.      Intervention Category Major Interventions: Delirium, psychosis, severe agitation - evaluation and management;Other:  Lysle Dingwall 11/01/2017, 11:37 PM

## 2017-11-01 NOTE — Progress Notes (Signed)
STAT EEG complete. Notified Neuro/ results pending

## 2017-11-01 NOTE — Consult Note (Addendum)
Requesting Physician: Dr. Rex Kras    Chief Complaint: New onset seizure x2  History obtained from: EMS and family  HPI:                                                                                                                                         Shawna Schneider is an 83 y.o. female with history of solitary kidney, hypertension, hyperlipidemia, carcinomatosis, gastrointestinal hemorrhage, adeno of descending colon, acute DVT of distal vein both lower extremities, extensive large volume of peritoneal omental metastasis along with bilateral pulmonary nodules consistent with metastatic cancer.  On 10 of 2018 patient was noted to have a small region of acute/early subacute infarct within the left posterior insula extending to the left frontoparietal junction along with scattered punctate foci of reduced diffusion in bilateral convexities of cerebellar hemispheres.  Patient does have at that time an EF of 55-60% with a small PFO.  During that hospital course she had an LDL of 48 and A1c of 6.5.    Initially patient was on Eliquis as initially patient refused Coumadin, Lovenox or IVC filter.  At this time she is on Lovenox .  She lives alone however was noted by son today to have a 15-20-second tonic-clonic seizure and then be confused and somnolent afterwards.  EMS was called and upon arrival to the ED patient had a second seizure which was described as stiffening of arms bilaterally and stiffening of legs bilaterally.  Talking to the son patient has never had a seizure in the past.  Currently she is intubated and sedated on propofol and has received 1 g of Keppra at this time.  She will also receive 500 mg of Keppra twice daily.  We will watch creatinine very closely   Past Medical History:  Diagnosis Date  . Cancer ([place])   . Hyperlipemia   . Hypertension   . Solitary kidney     Past Surgical History:  Procedure Laterality Date  . COLONOSCOPY WITH PROPOFOL N/A 05/28/2017   Procedure:  COLONOSCOPY WITH PROPOFOL;  Surgeon: Milus Banister, MD;  Location: [facility] ENDOSCOPY;  Service: Endoscopy;  Laterality: N/A;  . ESOPHAGOGASTRODUODENOSCOPY N/A 05/28/2017   Procedure: ESOPHAGOGASTRODUODENOSCOPY (EGD);  Surgeon: Milus Banister, MD;  Location: [facility] ENDOSCOPY;  Service: Endoscopy;  Laterality: N/A;  . IR FLUORO GUIDE PORT INSERTION RIGHT  06/23/2017  . IR US GUIDE VASC ACCESS RIGHT  06/23/2017  . KIDNEY SURGERY      Family History  Problem Relation Age of Onset  . Hypertension Mother   . Hypertension Father          Social History:  reports that she has never smoked. She has never used smokeless tobacco. She reports that she does not drink alcohol or use drugs.  Allergies:  Allergies  Allergen Reactions  . Penicillins Other (See Comments)    Has patient had a PCN reaction causing  immediate rash, facial/tongue/throat swelling, SOB or lightheadedness with hypotension: unkn Has patient had a PCN reaction causing severe rash involving mucus membranes or skin necrosis: unkn Has patient had a PCN reaction that required hospitalization: unkn Has patient had a PCN reaction occurring within the last 10 years: unkn If all of the above answers are "NO", then may proceed with Cephalosporin use.     Medications:                                                                                                                           Current Facility-Administered Medications  Medication Dose Route Frequency Provider Last Rate Last Dose  . [START ON 11/02/2017] aztreonam (AZACTAM) 1 g in sodium chloride 0.9 % 100 mL IVPB  1 g Intravenous Q8H Dang, Thuy D, RPH      . aztreonam (AZACTAM) 2 g in sodium chloride 0.9 % 100 mL IVPB  2 g Intravenous Once Little, Wenda Overland, MD 200 mL/hr at 11/01/17 1713 2 g at 11/01/17 1713  . fentaNYL (SUBLIMAZE) 100 MCG/2ML injection           . lactated ringers bolus 1,000 mL  1,000 mL Intravenous Once Dong, Robin, DO      . levETIRAcetam (KEPPRA) IVPB  500 mg/100 mL premix  500 mg Intravenous Q12H Marliss Coots, PA-C      . [START ON 11/03/2017] levofloxacin (LEVAQUIN) IVPB 500 mg  500 mg Intravenous Q48H Dang, Thuy D, RPH      . levofloxacin (LEVAQUIN) IVPB 750 mg  750 mg Intravenous Once Little, Wenda Overland, MD 100 mL/hr at 11/01/17 1715 750 mg at 11/01/17 1715  . propofol (DIPRIVAN) 1000 MG/100ML infusion           . vancomycin (VANCOCIN) 1,250 mg in sodium chloride 0.9 % 250 mL IVPB  1,250 mg Intravenous Once Dang, Thuy D, RPH 166.7 mL/hr at 11/01/17 1704 1,250 mg at 11/01/17 1704  . [START ON 11/02/2017] vancomycin (VANCOCIN) IVPB 750 mg/150 ml premix  750 mg Intravenous Q24H Dang, Thuy D, [facility]       Current Outpatient Medications  Medication Sig Dispense Refill  . acetaminophen (TYLENOL) 500 MG tablet Take 1,000 mg every 8 (eight) hours as needed by mouth for moderate pain.    Marland Kitchen allopurinol (ZYLOPRIM) 100 MG tablet Take 100 mg by mouth daily.  0  . dexamethasone (DECADRON) 4 MG tablet Take 4 mg twice a day for 3 days 6 tablet 0  . diltiazem (CARTIA XT) 180 MG 24 hr capsule Take 180 mg by mouth daily.    Marland Kitchen enoxaparin (LOVENOX) 80 MG/0.8ML injection Inject 0.8 mLs (80 mg total) into the skin daily. 30 Syringe 2  . HYDROcodone-acetaminophen (NORCO) 5-325 MG tablet Take 0.5-1 tablets every 6 (six) hours as needed by mouth for moderate pain. 30 tablet 0  . levothyroxine (SYNTHROID, LEVOTHROID) 75 MCG tablet Take 75 mcg by mouth daily.  0  . lidocaine-prilocaine (EMLA) cream Apply to port  site one hour prior to use. Do not rub in. Cover with plastic. 30 g 0  . loperamide (IMODIUM) 2 MG capsule Take 2 mg by mouth as needed for diarrhea or loose stools.    . metoCLOPramide (REGLAN) 5 MG tablet Take 1 tablet (5 mg total) by mouth 3 (three) times daily before meals. 90 tablet 0  . ondansetron (ZOFRAN) 8 MG tablet Take 1 tablet (8 mg total) by mouth every 8 (eight) hours as needed for nausea or vomiting. Begin 3 days after chemo. 30 tablet 1  .  prednisoLONE acetate (PRED FORTE) 1 % ophthalmic suspension Place 1 drop into the left eye daily.   0  . rosuvastatin (CRESTOR) 20 MG tablet Take 5 mg daily by mouth. Pt breaks meds into 1/4       ROS:                                                                                                                                       History unable be obtained secondary to patient intubated and sedated    General Examination:                                                                                                      Blood pressure 98/77, pulse (!) 123, resp. rate (!) 21, height 5\' 3"  (1.6 m), SpO2 98 %.  HEENT-  Normocephalic, no lesions, without obvious abnormality.  Normal external eye and conjunctiva.   Cardiovascular- S1-S2 audible, pulses palpable throughout   Lungs-no rhonchi or wheezing noted, no excessive working breathing.  Saturations within normal limits Abdomen- All 4 quadrants palpated and nontender Extremities- Warm, dry and intact Musculoskeletal-no joint tenderness, deformity or swelling Skin-warm and dry, no hyperpigmentation, vitiligo, or suspicious lesions  Neurological Examination Mental Status: Intubated and sedated, currently on propofol and loaded with 1 g of Keppra.  Patient does withdraw from pain in bilateral upper extremities and is gagging on the intubation tube. Cranial Nerves: II: No blink to threat III,IV, VI: ptosis not present, doll's are intact, pupils equal, round, reactive to light and accommodation V,VII: Face symmetrical   Motor: Moving extremities antigravity and spontaneously.  At times reaching for the tube and localizes to pain. Sensory: Responds to noxious stimuli. Deep Tendon Reflexes: 2+ and symmetric throughout Plantars: Upgoing bilaterally Cerebellar: Able to obtain secondary to patient intubated and sedated Gait: Unable to be obtained   Lab Results: Basic Metabolic Panel: Recent Labs  Lab 10/27/17 1103 11/01/17 1533  NA 142 140  K 3.5 3.5  CL 110* 110  CO2 21* 9*  GLUCOSE 93 145*  BUN 22 22*  CREATININE 1.71* 2.01*  CALCIUM 9.9 9.5    CBC: Recent Labs  Lab 10/27/17 1103 11/01/17 1533  WBC 7.8 9.4  NEUTROABS 5.1 5.5  HGB  --  8.7*  HCT 28.4* 29.0*  MCV 86.4 87.1  PLT 511* 427*    Lipid Panel: No results for input(s): CHOL, TRIG, HDL, CHOLHDL, VLDL, LDLCALC in the last 168 hours.  CBG: No results for input(s): GLUCAP in the last 168 hours.  Imaging: Ct Head Wo Contrast  Result Date: 11/01/2017 CLINICAL DATA:  Focal neuro deficit suspect stroke EXAM: CT HEAD WITHOUT CONTRAST TECHNIQUE: Contiguous axial images were obtained from the base of the skull through the vertex without intravenous contrast. COMPARISON:  CT head 05/26/2015 FINDINGS: Brain: Moderate atrophy. Negative for hydrocephalus. Hypodensity left parietal lobe compatible with chronic infarct which has occurred since the prior study. Mild chronic microvascular ischemic change in the white matter. Negative for acute infarct, hemorrhage, or mass. Vascular: Negative for hyperdense vessel Skull: Negative Sinuses/Orbits: Bilateral cataract removal. Paranasal sinuses clear. Other: None IMPRESSION: Atrophy and chronic ischemia including chronic infarct left parietal lobe. No acute abnormality. Electronically Signed   By: Franchot Gallo M.D.   On: 11/01/2017 16:49   Dg Chest Port 1 View  Result Date: 11/01/2017 CLINICAL DATA:  Altered mental status EXAM: PORTABLE CHEST 1 VIEW COMPARISON:  08/11/2017 FINDINGS: Right Port-A-Cath remains in place, unchanged. Endotracheal tube is 3.2 cm above the carina. NG tube is in the stomach. Mild elevation of the right hemidiaphragm. Right lower lobe airspace opacity concerning for consolidation. Rounded masses and nodules noted in both lungs, similar to prior study compatible with metastatic disease. Left base atelectasis or early infiltrate. No visible effusions. IMPRESSION: Bilateral pulmonary nodules  and masses compatible with metastases. Or confluent opacity now present in the right lung base concerning for consolidation/pneumonia. Left base atelectasis or early infiltrate. Endotracheal tube 3.2 cm above the carina. Electronically Signed   By: Rolm Baptise M.D.   On: 11/01/2017 16:17    Assessment and plan discussed with with attending physician and they are in agreement.    Etta Quill PA-C [facility] [phone]  11/01/2017, 5:34 PM    NEUROHOSPITALIST ADDENDUM Seen and examined the patient today. Formulated plan as documented a by PAC/Resident.   ASSESSMENT AND PLAN   83 y.o. female with metastatic colon cancer, prior stroke on CT scan presented to the hospital status post 2 back-to-back tonic-clonic seizures without return to baseline   Patient has never had seizures in the past. Patient intubated PCCM is evaluating patient for possible aspiration pneumonia versus sepsis.  While in the ED 1 g of Keppra has been administered.   Recommend -MRI brain without contrast to r/o new infarcts - Stat EEG to r/o non convulsive status  - PCCM following for aspiration pneumonia and sepsis - Start Maintenance dose of Keppra 500 mg twice daily ( renal dosing)     * Reviewed stat EEG - no seizure activity noted   This patient is neurologically critically ill due to presentation with status epilepticus.  She is at risk for significant risk of neurological worsening from cerebral edema,  death from brain herniation, heart failure, hemorrhagic conversion, infection, respiratory failure and seizure. This patient's care requires constant monitoring of vital signs, hemodynamics, respiratory and cardiac monitoring, review of multiple databases, neurological assessment, discussion with family, other specialists and  medical decision making of high complexity.  I spent 50  minutes of neurocritical time in the care of this patient.      Karena Addison Aroor MD [facility] [phone]  If 7pm to 7am, please call on call as listed on AMION.

## 2017-11-01 NOTE — ED Notes (Signed)
Per [facility], pt found by son at her home unresponsive, slumped over in the floor with agonal RR's. LSN yesterday afternoon at 1600. EMS began bagging, pt noted to have fixed gaze to the right with no purposeful movement. Son states that pt was seizing when he found her but has no hx. Pt has hx of CVA last October but no deficits. Pt has hx of stage 4 colon cancer and had treatment last week. Pt withdrawing from pain in all extremities upon arrival here with light grips on each side but not following any other commands. CBG 133, BP 130/100, HR 130 sinus tach, RR 26.

## 2017-11-01 NOTE — H&P (Addendum)
PULMONARY / CRITICAL CARE MEDICINE   Name: Shawna Schneider MRN: [id number] DOB: 1935-06-26    ADMISSION DATE:  11/01/2017 CONSULTATION DATE:  11/01/2017  REFERRING MD:  Dr. Rex Kras EDP  CHIEF COMPLAINT:  Seizure  HISTORY OF PRESENT ILLNESS:   83 year old female with PMH as below, which is significant for renal cell carcinoma s/p nephrectomy, HTN, stroke, DVT on lovenox (eliquis d/c after GI bleeding),and current metastatic adenocarcinoma of the colon on chemotherapy. S/p 5 cycles Folfox and 2 cycles (ongoing) of Folfiri. Last dose 3/20. Her son visits her every day to inject lovenox and on 3/24 she was in her usual state of health with no complaints. When he went to her on 3/25 he found her on the floor next to her bed, unresponsive with agonal respirations. He then witnessed a seizure and called EMS. Upon arrival to the ED she was intubated for airway protection. CT scan of the head demonstrated old stroke, but nothing new. Neurology has seen in the ED and has recommended MRI and Antiepileptic medications. PCCM has been asked to see for vent management.   PAST MEDICAL HISTORY :  She  has a past medical history of Cancer ([place]), Hyperlipemia, Hypertension, Seizures ([place]) (11/01/2017), Solitary kidney, and Stroke ([place]) (05/2017).  PAST SURGICAL HISTORY: She  has a past surgical history that includes Kidney surgery; Colonoscopy with propofol (N/A, 05/28/2017); Esophagogastroduodenoscopy (N/A, 05/28/2017); IR FLUORO GUIDE PORT INSERTION RIGHT (06/23/2017); and IR US Guide Vasc Access Right (06/23/2017).  Allergies  Allergen Reactions  . Penicillins Other (See Comments)    Has patient had a PCN reaction causing immediate rash, facial/tongue/throat swelling, SOB or lightheadedness with hypotension: unkn Has patient had a PCN reaction causing severe rash involving mucus membranes or skin necrosis: unkn Has patient had a PCN reaction that required hospitalization: unkn Has patient had a PCN  reaction occurring within the last 10 years: unkn If all of the above answers are "NO", then may proceed with Cephalosporin use.     No current facility-administered medications on file prior to encounter.    Current Outpatient Medications on File Prior to Encounter  Medication Sig  . acetaminophen (TYLENOL) 500 MG tablet Take 1,000 mg every 8 (eight) hours as needed by mouth for moderate pain.  Marland Kitchen allopurinol (ZYLOPRIM) 100 MG tablet Take 100 mg by mouth daily.  Marland Kitchen dexamethasone (DECADRON) 4 MG tablet Take 4 mg twice a day for 3 days  . diltiazem (CARTIA XT) 180 MG 24 hr capsule Take 180 mg by mouth daily.  Marland Kitchen enoxaparin (LOVENOX) 80 MG/0.8ML injection Inject 0.8 mLs (80 mg total) into the skin daily.  Marland Kitchen HYDROcodone-acetaminophen (NORCO) 5-325 MG tablet Take 0.5-1 tablets every 6 (six) hours as needed by mouth for moderate pain.  Marland Kitchen levothyroxine (SYNTHROID, LEVOTHROID) 75 MCG tablet Take 75 mcg by mouth daily.  Marland Kitchen lidocaine-prilocaine (EMLA) cream Apply to port site one hour prior to use. Do not rub in. Cover with plastic.  Marland Kitchen loperamide (IMODIUM) 2 MG capsule Take 2 mg by mouth as needed for diarrhea or loose stools.  . metoCLOPramide (REGLAN) 5 MG tablet Take 1 tablet (5 mg total) by mouth 3 (three) times daily before meals.  . ondansetron (ZOFRAN) 8 MG tablet Take 1 tablet (8 mg total) by mouth every 8 (eight) hours as needed for nausea or vomiting. Begin 3 days after chemo.  . prednisoLONE acetate (PRED FORTE) 1 % ophthalmic suspension Place 1 drop into the left eye daily.   . rosuvastatin (CRESTOR) 20  MG tablet Take 5 mg daily by mouth. Pt breaks meds into 1/4    FAMILY HISTORY:  Her indicated that her mother is deceased. She indicated that her father is deceased.   SOCIAL HISTORY: She  reports that she has never smoked. She has never used smokeless tobacco. She reports that she does not drink alcohol or use drugs.  REVIEW OF SYSTEMS:   unable  SUBJECTIVE:  unable  VITAL  SIGNS: BP 97/80   Pulse (!) 134   Temp (!) 97.5 F (36.4 C) (Axillary)   Resp (!) 21   Ht 5\' 3"  (1.6 m)   Wt 71.2 kg (157 lb)   SpO2 97%   BMI 27.81 kg/m   HEMODYNAMICS:    VENTILATOR SETTINGS: Vent Mode: PRVC FiO2 (%):  [100 %] 100 % Set Rate:  [28 bmp] 28 bmp Vt Set:  [420 mL] 420 mL PEEP:  [5 cmH20] 5 cmH20  INTAKE / OUTPUT: No intake/output data recorded.  PHYSICAL EXAMINATION: General:  Frail elderly female on vent Neuro:  Obtunded, but agitated with non-purposeful movements at time. Corneal/gag/cough in tact.  HEENT:  [place]/AT, PERRL, no JVD Cardiovascular:  RRR, no MRG Lungs:  Diminished R base Abdomen:  Soft, non-tender, non-distended Musculoskeletal:  No acute deformity or edema Skin:  Grossly intact.   LABS:  BMET Recent Labs  Lab 10/27/17 1103 11/01/17 1533  NA 142 140  K 3.5 3.5  CL 110* 110  CO2 21* 9*  BUN 22 22*  CREATININE 1.71* 2.01*  GLUCOSE 93 145*    Electrolytes Recent Labs  Lab 10/27/17 1103 11/01/17 1533  CALCIUM 9.9 9.5    CBC Recent Labs  Lab 10/27/17 1103 11/01/17 1533  WBC 7.8 9.4  HGB  --  8.7*  HCT 28.4* 29.0*  PLT 511* 427*    Coag's Recent Labs  Lab 11/01/17 1533  INR 1.14    Sepsis Markers Recent Labs  Lab 11/01/17 1545  LATICACIDVEN 11.24*    ABG Recent Labs  Lab 11/01/17 1608  PHART 7.104*  PCO2ART 27.6*  PO2ART 188.0*    Liver Enzymes Recent Labs  Lab 10/27/17 1103 11/01/17 1533  AST 10 41  ALT 7 13*  ALKPHOS 92 98  BILITOT 0.3 0.3  ALBUMIN 2.5* 2.6*    Cardiac Enzymes No results for input(s): TROPONINI, PROBNP in the last 168 hours.  Glucose No results for input(s): GLUCAP in the last 168 hours.  Imaging Ct Head Wo Contrast  Result Date: 11/01/2017 CLINICAL DATA:  Focal neuro deficit suspect stroke EXAM: CT HEAD WITHOUT CONTRAST TECHNIQUE: Contiguous axial images were obtained from the base of the skull through the vertex without intravenous contrast. COMPARISON:  CT head  05/26/2015 FINDINGS: Brain: Moderate atrophy. Negative for hydrocephalus. Hypodensity left parietal lobe compatible with chronic infarct which has occurred since the prior study. Mild chronic microvascular ischemic change in the white matter. Negative for acute infarct, hemorrhage, or mass. Vascular: Negative for hyperdense vessel Skull: Negative Sinuses/Orbits: Bilateral cataract removal. Paranasal sinuses clear. Other: None IMPRESSION: Atrophy and chronic ischemia including chronic infarct left parietal lobe. No acute abnormality. Electronically Signed   By: Franchot Gallo M.D.   On: 11/01/2017 16:49   Dg Chest Port 1 View  Result Date: 11/01/2017 CLINICAL DATA:  Altered mental status EXAM: PORTABLE CHEST 1 VIEW COMPARISON:  08/11/2017 FINDINGS: Right Port-A-Cath remains in place, unchanged. Endotracheal tube is 3.2 cm above the carina. NG tube is in the stomach. Mild elevation of the right hemidiaphragm. Right lower  lobe airspace opacity concerning for consolidation. Rounded masses and nodules noted in both lungs, similar to prior study compatible with metastatic disease. Left base atelectasis or early infiltrate. No visible effusions. IMPRESSION: Bilateral pulmonary nodules and masses compatible with metastases. Or confluent opacity now present in the right lung base concerning for consolidation/pneumonia. Left base atelectasis or early infiltrate. Endotracheal tube 3.2 cm above the carina. Electronically Signed   By: Rolm Baptise M.D.   On: 11/01/2017 16:17    STUDIES:  CT head 3/25 > Atrophy and chronic ischemia including chronic infarct left parietal lobe. No acute abnormality. MRI brain 3/26 >>>  CULTURES: Urine 3/25 > Blood 3/25 > Tracheal aspirate 3/25 >>>  ANTIBIOTICS: Azactam, levaquin, vancomycin in ED Ceftriaxone 3/25 >   SIGNIFICANT EVENTS: 3/25 admit for vent s/p seizures  LINES/TUBES: ETT 3/25 >  DISCUSSION: 83 year old female with PMH CVA and metastatic colon cancer  currently undergoing chemo. Presented to ED after seizure. Last known well one day prior. Intubated in ED for airway. Suspected to have aspirated. Admitted to ICU on vent, antibiotics, and AEDs.   ASSESSMENT / PLAN:  PULMONARY A: Acute hypercarbic respiratory failure Aspiration PNA  P:   Full vent support ABG CXR VAP bundle  CARDIOVASCULAR A:  Hypotension - hypovolemia vs sepsis.  H/o hypertension P:  IVF resuscitation May need pressors or to swap propofol.  Holding home diltiazem, Crestor for now  RENAL A:   AKI S/p nephrectomy  P:   Follow BMP Hydrate  GASTROINTESTINAL A:   Metastatic adenocarcinoma of colon  P:   NPO Consider oncology consult if goals of care become a concern.   HEMATOLOGIC A:   Anemia DVT  P:  Follow CBC Heparin infusion in lieu of home Lovenox.   INFECTIOUS A:   Likely aspiration pneumonia  P:   Ceftriaxone  DC levaquin, azactam, vanco PCT  ENDOCRINE A:   Hypothyroid  P:   Synthroid IV dose TSH  NEUROLOGIC A:   Seizure Acute metabolic encephalopathy P:   RASS goal: 0 to -1 Propofol infusion for RASS gaol PRN fentanyl MRI EEG AEDS per neuro  FAMILY  - Updates: Son TJ updated in ED. Elect full code.  - Inter-disciplinary family meet or Palliative Care meeting due by:  4/2   Georgann Housekeeper, AGACNP-BC [place] [facility] Pager [phone] or [phone]  11/01/2017 6:32 PM  I have seen and examined the patient and concur with the above findings. 83 y/o female intubated for status epilepticus. Receiving Keppra and LTM EEG. May require further AED's. CT reveals old left occipital stroke. For MRI. Plan is to admit to the icu for critical care management and assess daily for possible extubation. IV heparin for history of VTE. Was on Lovenox at home.  Salley Scarlet, MD [place] Pager [phone]

## 2017-11-02 ENCOUNTER — Inpatient Hospital Stay (HOSPITAL_COMMUNITY): Payer: BC Managed Care – PPO

## 2017-11-02 DIAGNOSIS — E440 Moderate protein-calorie malnutrition: Secondary | ICD-10-CM

## 2017-11-02 DIAGNOSIS — C189 Malignant neoplasm of colon, unspecified: Secondary | ICD-10-CM

## 2017-11-02 DIAGNOSIS — J9601 Acute respiratory failure with hypoxia: Secondary | ICD-10-CM

## 2017-11-02 DIAGNOSIS — J690 Pneumonitis due to inhalation of food and vomit: Secondary | ICD-10-CM

## 2017-11-02 DIAGNOSIS — L8990 Pressure ulcer of unspecified site, unspecified stage: Secondary | ICD-10-CM

## 2017-11-02 LAB — PROCALCITONIN: PROCALCITONIN: 11.09 ng/mL

## 2017-11-02 LAB — BASIC METABOLIC PANEL
Anion gap: 10 (ref 5–15)
BUN: 21 mg/dL — ABNORMAL HIGH (ref 6–20)
CHLORIDE: 115 mmol/L — AB (ref 101–111)
CO2: 16 mmol/L — ABNORMAL LOW (ref 22–32)
CREATININE: 1.76 mg/dL — AB (ref 0.44–1.00)
Calcium: 8.4 mg/dL — ABNORMAL LOW (ref 8.9–10.3)
GFR calc Af Amer: 30 mL/min — ABNORMAL LOW (ref >60)
GFR, EST NON AFRICAN AMERICAN: 26 mL/min — AB (ref >60)
Glucose, Bld: 120 mg/dL — ABNORMAL HIGH (ref 65–99)
Potassium: 3.5 mmol/L (ref 3.5–5.1)
SODIUM: 141 mmol/L (ref 135–145)

## 2017-11-02 LAB — CBC
HCT: 24.2 % — ABNORMAL LOW (ref 36.0–46.0)
Hemoglobin: 7.5 g/dL — ABNORMAL LOW (ref 12.0–15.0)
MCH: 26.5 pg (ref 26.0–34.0)
MCHC: 31 g/dL (ref 30.0–36.0)
MCV: 85.5 fL (ref 78.0–100.0)
PLATELETS: 312 10*3/uL (ref 150–400)
RBC: 2.83 MIL/uL — ABNORMAL LOW (ref 3.87–5.11)
RDW: 20 % — ABNORMAL HIGH (ref 11.5–15.5)
WBC: 13.3 10*3/uL — ABNORMAL HIGH (ref 4.0–10.5)

## 2017-11-02 LAB — PHOSPHORUS
PHOSPHORUS: 2.6 mg/dL (ref 2.5–4.6)
Phosphorus: 2.1 mg/dL — ABNORMAL LOW (ref 2.5–4.6)
Phosphorus: 2.7 mg/dL (ref 2.5–4.6)

## 2017-11-02 LAB — POCT I-STAT 3, ART BLOOD GAS (G3+)
Acid-base deficit: 8 mmol/L — ABNORMAL HIGH (ref 0.0–2.0)
Bicarbonate: 16.1 mmol/L — ABNORMAL LOW (ref 20.0–28.0)
O2 Saturation: 100 %
Patient temperature: 37.5
TCO2: 17 mmol/L — ABNORMAL LOW (ref 22–32)
pCO2 arterial: 27.9 mmHg — ABNORMAL LOW (ref 32.0–48.0)
pH, Arterial: 7.371 (ref 7.350–7.450)
pO2, Arterial: 264 mmHg — ABNORMAL HIGH (ref 83.0–108.0)

## 2017-11-02 LAB — MAGNESIUM
MAGNESIUM: 1.5 mg/dL — AB (ref 1.7–2.4)
MAGNESIUM: 1.9 mg/dL (ref 1.7–2.4)
Magnesium: 1.7 mg/dL (ref 1.7–2.4)

## 2017-11-02 LAB — GLUCOSE, CAPILLARY
Glucose-Capillary: 109 mg/dL — ABNORMAL HIGH (ref 65–99)
Glucose-Capillary: 86 mg/dL (ref 65–99)
Glucose-Capillary: 93 mg/dL (ref 65–99)
Glucose-Capillary: 99 mg/dL (ref 65–99)

## 2017-11-02 LAB — LACTIC ACID, PLASMA: LACTIC ACID, VENOUS: 1.1 mmol/L (ref 0.5–1.9)

## 2017-11-02 LAB — MRSA PCR SCREENING: MRSA by PCR: NEGATIVE

## 2017-11-02 MED ORDER — CHLORHEXIDINE GLUCONATE 0.12% ORAL RINSE (MEDLINE KIT)
15.0000 mL | Freq: Two times a day (BID) | OROMUCOSAL | Status: DC
  Administered 2017-11-02 – 2017-11-04 (×7): 15 mL via OROMUCOSAL

## 2017-11-02 MED ORDER — ACETAMINOPHEN 160 MG/5ML PO SOLN
650.0000 mg | Freq: Once | ORAL | Status: AC
  Administered 2017-11-02: 650 mg
  Filled 2017-11-02: qty 20.3

## 2017-11-02 MED ORDER — MIDAZOLAM HCL 2 MG/2ML IJ SOLN
1.0000 mg | INTRAMUSCULAR | Status: AC | PRN
  Administered 2017-11-02 – 2017-11-03 (×3): 2 mg via INTRAVENOUS
  Filled 2017-11-02: qty 4
  Filled 2017-11-02 (×2): qty 2

## 2017-11-02 MED ORDER — METRONIDAZOLE IN NACL 5-0.79 MG/ML-% IV SOLN
500.0000 mg | Freq: Once | INTRAVENOUS | Status: AC
  Administered 2017-11-02: 500 mg via INTRAVENOUS
  Filled 2017-11-02: qty 100

## 2017-11-02 MED ORDER — LEVOFLOXACIN IN D5W 750 MG/150ML IV SOLN: 750.0000 mg | Freq: Once | INTRAVENOUS | Status: DC

## 2017-11-02 MED ORDER — PRO-STAT SUGAR FREE PO LIQD
30.0000 mL | Freq: Two times a day (BID) | ORAL | Status: DC
  Administered 2017-11-02: 30 mL
  Filled 2017-11-02 (×2): qty 30

## 2017-11-02 MED ORDER — METRONIDAZOLE IN NACL 5-0.79 MG/ML-% IV SOLN
500.0000 mg | Freq: Three times a day (TID) | INTRAVENOUS | Status: DC
  Administered 2017-11-02: 500 mg via INTRAVENOUS
  Filled 2017-11-02: qty 100

## 2017-11-02 MED ORDER — LEVOTHYROXINE SODIUM 75 MCG PO TABS
75.0000 ug | ORAL_TABLET | Freq: Every day | ORAL | Status: DC
  Filled 2017-11-02: qty 1

## 2017-11-02 MED ORDER — ORAL CARE MOUTH RINSE
15.0000 mL | OROMUCOSAL | Status: DC
  Administered 2017-11-02 – 2017-11-03 (×17): 15 mL via OROMUCOSAL

## 2017-11-02 MED ORDER — CHLORHEXIDINE GLUCONATE CLOTH 2 % EX PADS
6.0000 | MEDICATED_PAD | Freq: Every day | CUTANEOUS | Status: DC
  Administered 2017-11-02 – 2017-11-08 (×6): 6 via TOPICAL

## 2017-11-02 MED ORDER — SODIUM CHLORIDE 0.9% FLUSH: 10.0000 mL | INTRAVENOUS | Status: DC | PRN

## 2017-11-02 MED ORDER — VITAL AF 1.2 CAL PO LIQD
1000.0000 mL | ORAL | Status: DC
  Administered 2017-11-02: 1000 mL

## 2017-11-02 MED ORDER — FAMOTIDINE IN NACL 20-0.9 MG/50ML-% IV SOLN
20.0000 mg | Freq: Once | INTRAVENOUS | Status: AC
  Administered 2017-11-02: 20 mg via INTRAVENOUS
  Filled 2017-11-02: qty 50

## 2017-11-02 MED ORDER — MAGNESIUM SULFATE 2 GM/50ML IV SOLN
2.0000 g | Freq: Once | INTRAVENOUS | Status: AC
  Administered 2017-11-02: 2 g via INTRAVENOUS
  Filled 2017-11-02: qty 50

## 2017-11-02 MED ORDER — VITAL HIGH PROTEIN PO LIQD
1000.0000 mL | ORAL | Status: DC
  Administered 2017-11-02: 1000 mL

## 2017-11-02 MED ORDER — LEVOFLOXACIN IN D5W 500 MG/100ML IV SOLN
500.0000 mg | INTRAVENOUS | Status: DC
  Administered 2017-11-03: 500 mg via INTRAVENOUS
  Filled 2017-11-02: qty 100

## 2017-11-02 MED ORDER — SODIUM CHLORIDE 0.9% FLUSH
10.0000 mL | Freq: Two times a day (BID) | INTRAVENOUS | Status: DC
  Administered 2017-11-02 (×2): 10 mL
  Administered 2017-11-02: 30 mL
  Administered 2017-11-03 – 2017-11-07 (×8): 10 mL

## 2017-11-02 MED ORDER — DIPHENHYDRAMINE HCL 50 MG/ML IJ SOLN
25.0000 mg | Freq: Once | INTRAMUSCULAR | Status: AC
  Administered 2017-11-02: 25 mg via INTRAVENOUS
  Filled 2017-11-02: qty 1

## 2017-11-02 MED ORDER — ENOXAPARIN SODIUM 80 MG/0.8ML ~~LOC~~ SOLN
80.0000 mg | SUBCUTANEOUS | Status: DC
  Administered 2017-11-02 – 2017-11-08 (×7): 80 mg via SUBCUTANEOUS
  Filled 2017-11-02 (×7): qty 0.8

## 2017-11-02 NOTE — Progress Notes (Signed)
Initial Nutrition Assessment  DOCUMENTATION CODES:   Non-severe (moderate) malnutrition in context of chronic illness  INTERVENTION:    Vital AF 1.2 at 60 ml/h (1440 ml per day)   Provides 1728 kcal, 108 gm protein, 1168 ml free water daily  NUTRITION DIAGNOSIS:   Moderate Malnutrition related to chronic illness(colon cancer) as evidenced by mild muscle depletion, moderate muscle depletion, edema, percent weight loss(9.5% weight loss within 3 months).  GOAL:   Patient will meet greater than or equal to 90% of their needs  MONITOR:   Vent status, TF tolerance, Labs, Skin, I & O's  REASON FOR ASSESSMENT:   Ventilator, Consult Enteral/tube feeding initiation and management  ASSESSMENT:   83 yo female with PMH of renal cell carcinoma, metastatic colon cancer who was admitted on 3/25 with status epilepticus, requiring vent support.   Discussed patient in ICU rounds and with RN today. Received MD Consult for TF initiation and management. Patient is currently intubated on ventilator support MV: 11.6 L/min Temp (24hrs), Avg:98.7 F (37.1 C), Min:97.3 F (36.3 C), Max:100.6 F (38.1 C)   Labs reviewed. Phosphorus 2.1 (L) up to 2.6 (WNL), magnesium 1.5 (L) up to 1.7 (WNL) CBG's: 109  Medications reviewed.  Per discussion with patient's oldest son in room with patient, she has been eating poorly for the past month and a half due to gagging on food, difficulty swallowing. Medications were adjusted to help with this issue. Then, chemo medication was changed and difficulty with eating returned. She has lost weight, son thinks she usually weighs around 180 lbs.  Per review of usual weights, patient with 9.5% weight loss within the past 3 months, which is significant for the time frame.  NUTRITION - FOCUSED PHYSICAL EXAM:    Most Recent Value  Orbital Region  No depletion  Upper Arm Region  No depletion  Thoracic and Lumbar Region  Unable to assess  Buccal Region  No  depletion  Temple Region  Moderate depletion  Clavicle Bone Region  Mild depletion  Clavicle and Acromion Bone Region  Mild depletion  Scapular Bone Region  Unable to assess  Dorsal Hand  Unable to assess  Patellar Region  No depletion  Anterior Thigh Region  No depletion  Posterior Calf Region  Mild depletion  Edema (RD Assessment)  Mild  Hair  Reviewed  Eyes  Unable to assess  Mouth  Unable to assess  Skin  Reviewed  Nails  Unable to assess       Diet Order:  Diet NPO time specified  EDUCATION NEEDS:   No education needs have been identified at this time  Skin:  Skin Assessment: Skin Integrity Issues: Skin Integrity Issues:: Stage II Stage II: sacrum  Last BM:  unknown  Height:   Ht Readings from Last 1 Encounters:  11/01/17 5\' 3"  (1.6 m)    Weight:   Wt Readings from Last 1 Encounters:  11/01/17 162 lb 14.7 oz (73.9 kg)    Ideal Body Weight:  52.3 kg  BMI:  Body mass index is 28.86 kg/m.  Estimated Nutritional Needs:   Kcal:  1650  Protein:  100-120 gm  Fluid:  1.7-1.9 L    Molli Barrows, RD, LDN, CNSC Pager [phone] After Hours Pager [phone]

## 2017-11-02 NOTE — Progress Notes (Signed)
[facility] Progress Note Patient Name: Shawna Schneider DOB: July 01, 1935 MRN: [id number]   Date of Service  11/02/2017  HPI/Events of Note  Nurse reports lip swelling s/p Rocephin dose. Patient has allergy to Penicillin. Require coverage for aspiration.   eICU Interventions  Will order: 1. D/C Rocephin. 2. Flagyl and Levaquin per pharmacy consult.      Intervention Category Major Interventions: Other:  Sommer,Steven Cornelia Copa 11/02/2017, 2:06 AM

## 2017-11-02 NOTE — Progress Notes (Signed)
Pt transported via vent to MRI and back with current settings and 100% O2. Pt tol well. No complications. ETT secure and intact. Pt placed back on vent with current settings and transported back to room without complications.

## 2017-11-02 NOTE — Progress Notes (Addendum)
PULMONARY / CRITICAL CARE MEDICINE   Name: Shawna Schneider MRN: [id number] DOB: 1934/11/30    ADMISSION DATE:  11/01/2017 CONSULTATION DATE:  11/01/17  REFERRING MD:  EDP  CHIEF COMPLAINT:  Confusion seizures  HISTORY OF PRESENT ILLNESS:   83 yo female with a history of renal cell carcinoma, now colon cancer who was admitted with status epilepticus.    SUBJECTIVE:  Lip swelling overnight with ceftriaxone overnight  VITAL SIGNS: BP (!) 87/56   Pulse 89   Temp 97.9 F (36.6 C) (Core)   Resp (!) 28   Ht 5' 3"  (1.6 m)   Wt 162 lb 14.7 oz (73.9 kg)   SpO2 100%   BMI 28.86 kg/m   HEMODYNAMICS:    VENTILATOR SETTINGS: Vent Mode: PRVC FiO2 (%):  [60 %-100 %] 60 % Set Rate:  [28 bmp] 28 bmp Vt Set:  [420 mL] 420 mL PEEP:  [5 cmH20] 5 cmH20 Plateau Pressure:  [17 cmH20-30 cmH20] 17 cmH20  INTAKE / OUTPUT: I/O last 3 completed shifts: In: 6233.3 [I.V.:4433.3; IV Piggyback:1800] Out: 583 [Urine:183; Emesis/NG output:400]  PHYSICAL EXAMINATION:  General:  In bed on vent HENT: NCAT ETT in place PULM: CTA B, vent supported breathing CV: RRR, no mgr GI: BS+, soft, nontender MSK: normal bulk and tone Neuro: sedated on vent    LABS:  BMET Recent Labs  Lab 11/01/17 1533 11/01/17 2108 11/02/17 0437  NA 140 140 141  K 3.5 3.4* 3.5  CL 110 112* 115*  CO2 9* 17* 16*  BUN 22* 23* 21*  CREATININE 2.01* 1.79* 1.76*  GLUCOSE 145* 123* 120*    Electrolytes Recent Labs  Lab 11/01/17 1533 11/01/17 2108 11/01/17 2109 11/02/17 0437  CALCIUM 9.5 8.6*  --  8.4*  MG  --   --  1.0* 1.5*  PHOS  --  2.7  --  2.1*    CBC Recent Labs  Lab 10/27/17 1103 11/01/17 1533 11/01/17 2109 11/02/17 0437  WBC 7.8 9.4  --  13.3*  HGB  --  8.7* 8.1* 7.5*  HCT 28.4* 29.0* 26.4* 24.2*  PLT 511* 427*  --  312    Coag's Recent Labs  Lab 11/01/17 1533  INR 1.14    Sepsis Markers Recent Labs  Lab 11/01/17 1545 11/01/17 2109 11/02/17 0437  LATICACIDVEN 11.24*  1.8 1.1  PROCALCITON  --  4.82 11.09    ABG Recent Labs  Lab 11/01/17 1608 11/01/17 2040 11/02/17 0321  PHART 7.104* 7.345* 7.371  PCO2ART 27.6* 30.0* 27.9*  PO2ART 188.0* 99.0 264.0*    Liver Enzymes Recent Labs  Lab 10/27/17 1103 11/01/17 1533 11/01/17 2108  AST 10 41  --   ALT 7 13*  --   ALKPHOS 92 98  --   BILITOT 0.3 0.3  --   ALBUMIN 2.5* 2.6* 2.2*    Cardiac Enzymes No results for input(s): TROPONINI, PROBNP in the last 168 hours.  Glucose Recent Labs  Lab 11/01/17 2040 11/02/17 0722  GLUCAP 117* 109*    Imaging Ct Head Wo Contrast  Result Date: 11/01/2017 CLINICAL DATA:  Focal neuro deficit suspect stroke EXAM: CT HEAD WITHOUT CONTRAST TECHNIQUE: Contiguous axial images were obtained from the base of the skull through the vertex without intravenous contrast. COMPARISON:  CT head 05/26/2015 FINDINGS: Brain: Moderate atrophy. Negative for hydrocephalus. Hypodensity left parietal lobe compatible with chronic infarct which has occurred since the prior study. Mild chronic microvascular ischemic change in the white matter. Negative for acute infarct, hemorrhage, or  mass. Vascular: Negative for hyperdense vessel Skull: Negative Sinuses/Orbits: Bilateral cataract removal. Paranasal sinuses clear. Other: None IMPRESSION: Atrophy and chronic ischemia including chronic infarct left parietal lobe. No acute abnormality. Electronically Signed   By: Franchot Gallo M.D.   On: 11/01/2017 16:49   Dg Chest Port 1 View  Result Date: 11/02/2017 CLINICAL DATA:  Hypoxia EXAM: PORTABLE CHEST 1 VIEW COMPARISON:  November 01, 2017 FINDINGS: Endotracheal tube tip is 3.1 cm above the carina. Central catheter tip is in superior vena cava. Nasogastric tube tip is below the diaphragm with side port in stomach. No pneumothorax. There is airspace consolidation in the right lower lobe with small right pleural effusion. There is consolidation in the medial left base as well. Heart size and pulmonary  vascularity are normal. No adenopathy. No bone lesions. IMPRESSION: Tube and catheter positions as described without pneumothorax. Consolidation both lung zones, considerably more on the right than on the left. Heart size within normal limits. Electronically Signed   By: Lowella Grip III M.D.   On: 11/02/2017 08:10   Dg Chest Port 1 View  Result Date: 11/01/2017 CLINICAL DATA:  Altered mental status EXAM: PORTABLE CHEST 1 VIEW COMPARISON:  08/11/2017 FINDINGS: Right Port-A-Cath remains in place, unchanged. Endotracheal tube is 3.2 cm above the carina. NG tube is in the stomach. Mild elevation of the right hemidiaphragm. Right lower lobe airspace opacity concerning for consolidation. Rounded masses and nodules noted in both lungs, similar to prior study compatible with metastatic disease. Left base atelectasis or early infiltrate. No visible effusions. IMPRESSION: Bilateral pulmonary nodules and masses compatible with metastases. Or confluent opacity now present in the right lung base concerning for consolidation/pneumonia. Left base atelectasis or early infiltrate. Endotracheal tube 3.2 cm above the carina. Electronically Signed   By: Rolm Baptise M.D.   On: 11/01/2017 16:17     STUDIES:  CT head 3/25 > Atrophy and chronic ischemia including chronic infarct left parietal lobe. No acute abnormality. MRI brain 05/2017 >>> small acute/early subacute infarct left pot insula, scattered punctate reduced diffuseion likely embolic phenomena, late subacute infarct left occip lobe, moderate chronic volume loss brain MRI brain 3/26 >   CULTURES: Urine 3/25 > Blood 3/25 > Tracheal aspirate 3/25 >>>  ANTIBIOTICS: Azactam, levaquin, vancomycin in ED Ceftriaxone 3/25 > 3/26 Flagyl 3/26 x1 levaquin 3/26 >   SIGNIFICANT EVENTS: 3/25 admit for vent s/p seizures  LINES/TUBES: ETT 3/25 >   DISCUSSION: 83 y/o female with metastatic colon cancer here with status epilepticus  ASSESSMENT /  PLAN:  PULMONARY A: Acute respiratory failure with hypoxemia Aspiration pneumonia P:   Full mechanical vent support VAP prevention Daily WUA/SBT   CARDIOVASCULAR A:  Hypertension baseline, soft BP here  P:  Tele Hold home home antihypertensives Bubble study  RENAL A:   Chronic kidney failure P:   Monitor BMET and UOP Replace electrolytes as needed Continue normal saline  GASTROINTESTINAL A:   No acute issues P:   Start tube feedings Pantoprazole for stress ulcer prophylaxis  HEMATOLOGIC A:   Anemia History of DVT Met colon cancer on treatment P:  Restart lovenox  INFECTIOUS A:   RLL aspiration pneumonia P:   Stop flagyl Continue levaquin If decompensates add back HCAP coverage  ENDOCRINE A:   Hypothyroidism P:   Change synthroid to oral  NEUROLOGIC A:   Status epilepticus Sedation need for vent synchrony Multiple strokes seen on MRI brain 05/2017 P:   RASS goal: 0 Fentanyl gtt per PAD protocol MRI  brain today Continue keppra  FAMILY  - Updates: updated son by phone 3/26  - Inter-disciplinary family meet or Palliative Care meeting due by:  day 7   My cc time 31 minutes  Roselie Awkward, MD [facility] Pager: [phone] Cell: [phone] After 3pm or if no response, call [phone]   11/02/2017, 10:02 AM  ]

## 2017-11-02 NOTE — Progress Notes (Addendum)
Subjective: She is currently on fentanyl, intubated, moves bilateral arms volitionally spontaneously, apparently per nurse was following some commands but did not for me.  Opens eyes to voice.  No clinical seizures noted overnight.  Exam: Vitals:   11/02/17 0930 11/02/17 1000  BP: 91/62 (!) 87/56  Pulse: 89 89  Resp: 20 (!) 28  Temp:    SpO2: 100% 100%    Physical Exam   HEENT-  Normocephalic, no lesions, without obvious abnormality.  Normal external eye and conjunctiva.   Cardiovascular- S1-S2 audible, pulses palpable throughout   Lungs-no rhonchi or wheezing noted, no excessive working breathing.  Saturations within normal limits Abdomen- All 4 quadrants palpated and nontender Extremities- Warm, dry and intact Musculoskeletal-no joint tenderness, deformity or swelling Skin-warm and dry, no hyperpigmentation, vitiligo, or suspicious lesions    Neuro:  Mental Status: Patient opens eyes to voice, slightly tracks my movements, moves bilateral arms antigravity volitionally, withdraws from pain bilateral arms and legs.  Patient is on fentanyl at this time and she is not breathing over the vent, she is on 75 mcg/h of fentanyl Cranial Nerves: II: Blinks to threat III,IV, VI: ptosis not present, extra-ocular motions intact bilaterally pupils equal, round, reactive to light and accommodation V,VII: Intubated, facial light touch sensation normal bilaterally, corneals intact VIII: hearing normal bilaterally  Motor: Moving all extremities antigravity to pain with upper arms spontaneous Sensory: Draws from noxious stimuli bilaterally Deep Tendon Reflexes: 2+ and symmetric throughout Plantars: Upgoing bilaterally     Medications:  Scheduled: . chlorhexidine gluconate (MEDLINE KIT)  15 mL Mouth Rinse BID  . Chlorhexidine Gluconate Cloth  6 each Topical Daily  . enoxaparin (LOVENOX) injection  80 mg Subcutaneous Q24H  . feeding supplement (PRO-STAT SUGAR FREE 64)  30 mL Per Tube BID   . feeding supplement (VITAL HIGH PROTEIN)  1,000 mL Per Tube Q24H  . [START ON 11/03/2017] levothyroxine  75 mcg Oral QAC breakfast  . mouth rinse  15 mL Mouth Rinse 10 times per day  . pantoprazole (PROTONIX) IV  40 mg Intravenous QHS  . sodium chloride flush  10-40 mL Intracatheter Q12H   Continuous: . sodium chloride    . sodium chloride 125 mL/hr at 11/02/17 0953  . fentaNYL infusion INTRAVENOUS 50 mcg/hr (11/02/17 1043)  . levETIRAcetam Stopped (11/02/17 0530)  . [START ON 11/03/2017] levofloxacin (LEVAQUIN) IV    . magnesium sulfate 1 - 4 g bolus IVPB 2 g (11/02/17 1041)    Pertinent Labs/Diagnostics: EEG Impression: This is an abnormal EEG due to persistent background slowing/disorganization seen throughout the tracing.  This is a non-specific finding that can be seen with toxic, metabolic, diffuse, or multifocal structural processes.  No definite epileptiform changes were noted.   A single EEG without epileptiform changes does not exclude the diagnosis of epilepsy. Clinical correlation advised.  Creatinine 1.76 and slightly improving Magnesium 1.5 Phosphorus 2.1  MRI brain pending   Ct Head Wo Contrast  Result Date: 11/01/2017 CLINICAL DATA:  Focal neuro deficit suspect stroke EXAM: CT HEAD WITHOUT CONTRAST TECHNIQUE: Contiguous axial images were obtained from the base of the skull through the vertex without intravenous contrast. COMPARISON:  CT head 05/26/2015 FINDINGS: Brain: Moderate atrophy. Negative for hydrocephalus. Hypodensity left parietal lobe compatible with chronic infarct which has occurred since the prior study. Mild chronic microvascular ischemic change in the white matter. Negative for acute infarct, hemorrhage, or mass. Vascular: Negative for hyperdense vessel Skull: Negative Sinuses/Orbits: Bilateral cataract removal. Paranasal sinuses clear. Other: None IMPRESSION:  Atrophy and chronic ischemia including chronic infarct left parietal lobe. No acute abnormality.  Electronically Signed   By: Franchot Gallo M.D.   On: 11/01/2017 16:49   Dg Chest Port 1 View  Result Date: 11/02/2017 CLINICAL DATA:  Hypoxia EXAM: PORTABLE CHEST 1 VIEW COMPARISON:  November 01, 2017 FINDINGS: Endotracheal tube tip is 3.1 cm above the carina. Central catheter tip is in superior vena cava. Nasogastric tube tip is below the diaphragm with side port in stomach. No pneumothorax. There is airspace consolidation in the right lower lobe with small right pleural effusion. There is consolidation in the medial left base as well. Heart size and pulmonary vascularity are normal. No adenopathy. No bone lesions. IMPRESSION: Tube and catheter positions as described without pneumothorax. Consolidation both lung zones, considerably more on the right than on the left. Heart size within normal limits. Electronically Signed   By: Lowella Grip III M.D.   On: 11/02/2017 08:10   Dg Chest Port 1 View  Result Date: 11/01/2017 CLINICAL DATA:  Altered mental status EXAM: PORTABLE CHEST 1 VIEW COMPARISON:  08/11/2017 FINDINGS: Right Port-A-Cath remains in place, unchanged. Endotracheal tube is 3.2 cm above the carina. NG tube is in the stomach. Mild elevation of the right hemidiaphragm. Right lower lobe airspace opacity concerning for consolidation. Rounded masses and nodules noted in both lungs, similar to prior study compatible with metastatic disease. Left base atelectasis or early infiltrate. No visible effusions. IMPRESSION: Bilateral pulmonary nodules and masses compatible with metastases. Or confluent opacity now present in the right lung base concerning for consolidation/pneumonia. Left base atelectasis or early infiltrate. Endotracheal tube 3.2 cm above the carina. Electronically Signed   By: Rolm Baptise M.D.   On: 11/01/2017 16:17     Etta Quill PA-C [facility] [phone]  Impression:  83 y.o. female with metastatic colon cancer, prior stroke on CT scan presented to the hospital  status post 2 back-to-back tonic-clonic seizures without return to baseline   Patient has never had seizures in the past. Patient intubated PCCM is evaluating patient for possible aspiration pneumonia versus sepsis.  While in the ED 1 g of Keppra has been administered, and currently receiving 500 mg Keppra twice daily.  She is on a fentanyl drip and intubated at this time.  Plan is to keep her on the fentanyl drip until MRI is finished per [facility] M.  No further seizures noted overnight.   Recommend -MRI brain without contrast to r/o new infarcts - Continue maintenance dose of Keppra 500 mg twice daily ( renal dosing)  -- Wean sedation as possible     11/02/2017, 10:47 AM     NEUROHOSPITALIST ADDENDUM  Formulated plan as documented above by PAC/Resident. I agree with recommendations as above. Will follow.  Karena Addison Vinessa Macconnell MD [facility] [phone]  If 7pm to 7am, please call on call as listed on AMION.

## 2017-11-02 NOTE — Progress Notes (Signed)
RT NOTE: RT transported patient on ventilator from room 2M03 to MRI and back to room 0F11 with no complications. Vitals are stable. RT will continue to monitor.

## 2017-11-02 NOTE — Progress Notes (Signed)
Pharmacy Antibiotic Note  Shawna Schneider is a 83 y.o. female admitted on 11/01/2017 with seizure and suspected resultant aspiration pneumonia.  Pt has h/o PCN allergy and had lip swelling after dose of Rocephin during this admission.  Pharmacy has been consulted for Levaquin and Flagyl dosing.  Plan: Rec'd Levaquin 750mg  in ED. Levaquin 500mg  IV Q48H. Flagyl 500mg  IV Q8H.  Height: 5\' 3"  (160 cm) Weight: 162 lb 14.7 oz (73.9 kg) IBW/kg (Calculated) : 52.4  Temp (24hrs), Avg:98.7 F (37.1 C), Min:97.3 F (36.3 C), Max:100.6 F (38.1 C)  Recent Labs  Lab 10/27/17 1103 11/01/17 1533 11/01/17 1545 11/01/17 2108 11/01/17 2109  WBC 7.8 9.4  --   --   --   CREATININE 1.71* 2.01*  --  1.79*  --   LATICACIDVEN  --   --  11.24*  --  1.8    Estimated Creatinine Clearance: 24.1 mL/min (A) (by C-G formula based on SCr of 1.79 mg/dL (H)).    Allergies  Allergen Reactions  . Ceftriaxone Swelling    Lip swelling  . Penicillins Other (See Comments)    Has patient had a PCN reaction causing immediate rash, facial/tongue/throat swelling, SOB or lightheadedness with hypotension: unkn Has patient had a PCN reaction causing severe rash involving mucus membranes or skin necrosis: unkn Has patient had a PCN reaction that required hospitalization: unkn Has patient had a PCN reaction occurring within the last 10 years: unkn If all of the above answers are "NO", then may proceed with Cephalosporin use.     Thank you for allowing pharmacy to be a part of this patient's care.  Wynona Neat, PharmD, BCPS  11/02/2017 2:08 AM

## 2017-11-03 ENCOUNTER — Inpatient Hospital Stay (HOSPITAL_COMMUNITY): Payer: BC Managed Care – PPO

## 2017-11-03 DIAGNOSIS — R569 Unspecified convulsions: Secondary | ICD-10-CM

## 2017-11-03 DIAGNOSIS — J9600 Acute respiratory failure, unspecified whether with hypoxia or hypercapnia: Secondary | ICD-10-CM

## 2017-11-03 LAB — URINE CULTURE: Culture: NO GROWTH

## 2017-11-03 LAB — MAGNESIUM
MAGNESIUM: 2 mg/dL (ref 1.7–2.4)
Magnesium: 1.8 mg/dL (ref 1.7–2.4)

## 2017-11-03 LAB — GLUCOSE, CAPILLARY
Glucose-Capillary: 85 mg/dL (ref 65–99)
Glucose-Capillary: 85 mg/dL (ref 65–99)
Glucose-Capillary: 105 mg/dL — ABNORMAL HIGH (ref 65–99)
Glucose-Capillary: 86 mg/dL (ref 65–99)
Glucose-Capillary: 91 mg/dL (ref 65–99)
Glucose-Capillary: 89 mg/dL (ref 65–99)

## 2017-11-03 LAB — CBC
HCT: 23.8 % — ABNORMAL LOW (ref 36.0–46.0)
Hemoglobin: 7.3 g/dL — ABNORMAL LOW (ref 12.0–15.0)
MCH: 26.2 pg (ref 26.0–34.0)
MCHC: 30.7 g/dL (ref 30.0–36.0)
MCV: 85.3 fL (ref 78.0–100.0)
Platelets: 239 10*3/uL (ref 150–400)
RBC: 2.79 MIL/uL — ABNORMAL LOW (ref 3.87–5.11)
RDW: 20.4 % — ABNORMAL HIGH (ref 11.5–15.5)
WBC: 15.2 10*3/uL — ABNORMAL HIGH (ref 4.0–10.5)

## 2017-11-03 LAB — PHOSPHORUS
Phosphorus: 3.3 mg/dL (ref 2.5–4.6)
Phosphorus: 3 mg/dL (ref 2.5–4.6)

## 2017-11-03 LAB — BASIC METABOLIC PANEL
Anion gap: 10 (ref 5–15)
BUN: 23 mg/dL — ABNORMAL HIGH (ref 6–20)
CO2: 16 mmol/L — ABNORMAL LOW (ref 22–32)
Calcium: 9.5 mg/dL (ref 8.9–10.3)
Chloride: 116 mmol/L — ABNORMAL HIGH (ref 101–111)
Creatinine, Ser: 2.1 mg/dL — ABNORMAL HIGH (ref 0.44–1.00)
GFR calc Af Amer: 24 mL/min — ABNORMAL LOW (ref >60)
GFR calc non Af Amer: 21 mL/min — ABNORMAL LOW (ref >60)
Glucose, Bld: 92 mg/dL (ref 65–99)
Potassium: 3.6 mmol/L (ref 3.5–5.1)
Sodium: 142 mmol/L (ref 135–145)

## 2017-11-03 LAB — PROCALCITONIN: PROCALCITONIN: 12.23 ng/mL

## 2017-11-03 MED ORDER — LEVETIRACETAM 100 MG/ML PO SOLN
500.0000 mg | Freq: Two times a day (BID) | ORAL | Status: DC
  Administered 2017-11-03 – 2017-11-04 (×2): 500 mg
  Filled 2017-11-03 (×3): qty 5

## 2017-11-03 MED ORDER — ORAL CARE MOUTH RINSE
15.0000 mL | Freq: Four times a day (QID) | OROMUCOSAL | Status: DC
  Administered 2017-11-03 – 2017-11-04 (×3): 15 mL via OROMUCOSAL

## 2017-11-03 MED ORDER — MAGNESIUM SULFATE IN D5W 1-5 GM/100ML-% IV SOLN
1.0000 g | Freq: Once | INTRAVENOUS | Status: AC
  Administered 2017-11-03: 1 g via INTRAVENOUS
  Filled 2017-11-03: qty 100

## 2017-11-03 MED ORDER — PANTOPRAZOLE SODIUM 40 MG PO PACK
40.0000 mg | PACK | Freq: Every day | ORAL | Status: DC
  Administered 2017-11-03: 40 mg
  Filled 2017-11-03 (×2): qty 20

## 2017-11-03 MED ORDER — MIDAZOLAM HCL 2 MG/2ML IJ SOLN: 1.0000 mg | INTRAMUSCULAR | Status: DC | PRN

## 2017-11-03 MED ORDER — LEVOTHYROXINE SODIUM 75 MCG PO TABS
75.0000 ug | ORAL_TABLET | Freq: Every day | ORAL | Status: DC
  Administered 2017-11-03 – 2017-11-05 (×3): 75 ug
  Filled 2017-11-03 (×3): qty 1

## 2017-11-03 MED ORDER — MIDAZOLAM HCL 2 MG/2ML IJ SOLN: 1.0000 mg | INTRAMUSCULAR | Status: AC | PRN

## 2017-11-03 MED ORDER — LEVETIRACETAM 100 MG/ML PO SOLN: 500.0000 mg | Freq: Two times a day (BID) | ORAL | Status: DC

## 2017-11-03 MED ORDER — ONDANSETRON HCL 4 MG/2ML IJ SOLN
4.0000 mg | Freq: Four times a day (QID) | INTRAMUSCULAR | Status: DC | PRN
  Administered 2017-11-04: 4 mg via INTRAVENOUS
  Filled 2017-11-03: qty 2

## 2017-11-03 MED ORDER — FENTANYL 2500MCG IN NS 250ML (10MCG/ML) PREMIX INFUSION
0.0000 ug/h | INTRAVENOUS | Status: DC
  Administered 2017-11-03: 100 ug/h via INTRAVENOUS
  Filled 2017-11-03: qty 250

## 2017-11-03 NOTE — Plan of Care (Signed)
  Problem: Education: Goal: Knowledge of General Education information will improve Outcome: Progressing   Problem: Health Behavior/Discharge Planning: Goal: Ability to manage health-related needs will improve Outcome: Progressing   Problem: Clinical Measurements: Goal: Ability to maintain clinical measurements within normal limits will improve Outcome: Progressing Goal: Will remain free from infection Outcome: Progressing Goal: Diagnostic test results will improve Outcome: Progressing Goal: Respiratory complications will improve Outcome: Progressing Goal: Cardiovascular complication will be avoided Outcome: Progressing   Problem: Activity: Goal: Risk for activity intolerance will decrease Outcome: Progressing   Problem: Nutrition: Goal: Adequate nutrition will be maintained Outcome: Progressing   Problem: Coping: Goal: Level of anxiety will decrease Outcome: Progressing   Problem: Elimination: Goal: Will not experience complications related to bowel motility Outcome: Progressing Goal: Will not experience complications related to urinary retention Outcome: Progressing   Problem: Pain Managment: Goal: General experience of comfort will improve Outcome: Progressing   Problem: Safety: Goal: Ability to remain free from injury will improve Outcome: Progressing   Problem: Skin Integrity: Goal: Risk for impaired skin integrity will decrease Outcome: Progressing   Problem: Activity: Goal: Ability to tolerate increased activity will improve Outcome: Progressing   Problem: Respiratory: Goal: Ability to maintain a clear airway and adequate ventilation will improve Outcome: Progressing   Problem: Role Relationship: Goal: Method of communication will improve Outcome: Progressing

## 2017-11-03 NOTE — Progress Notes (Signed)
Patient unable to wean vomited x2 tube feeds on hold, OGT to suction minimal output. ET tube suctions moderate amount of thick white secretions. Patient placed back on full support and fentanyl drip. MD notified.

## 2017-11-03 NOTE — Progress Notes (Signed)
PULMONARY / CRITICAL CARE MEDICINE   Name: Shawna Schneider MRN: [id number] DOB: 26-Jun-1935    ADMISSION DATE:  11/01/2017 CONSULTATION DATE:  11/01/17  REFERRING MD:  EDP  CHIEF COMPLAINT:  Confusion seizures  BRIEF 83 yo female with a history of renal cell carcinoma, now colon cancer who was admitted with status epilepticus.    STUDIES:  CT head 3/25 > Atrophy and chronic ischemia including chronic infarct left parietal lobe. No acute abnormality. MRI brain 05/2017 >>> small acute/early subacute infarct left pot insula, scattered punctate reduced diffuseion likely embolic phenomena, late subacute infarct left occip lobe, moderate chronic volume loss brain MRI brain 3/26 >   CULTURES: Urine 3/25 > Blood 3/25 > Tracheal aspirate 3/25 >>>  ANTIBIOTICS: Azactam, levaquin, vancomycin in ED Ceftriaxone 3/25 > 3/26 Flagyl 3/26 x1 levaquin 3/26 >   LINES/TUBES: ETT 3/25 > 3/25 admit for vent s/p seizures 3/26 - Lip swelling overnight with ceftriaxone overnight   SUBJECTIVE/OVERNIGHT/INTERVAL HX 11/03/17 - now on TF Failed sBT due to apnea. Fent gtt just turned off though. No fever. On keppra. No breatkthrough seizures  VITAL SIGNS: BP 118/71   Pulse (!) 123   Temp 98.1 F (36.7 C) (Core (Comment))   Resp (!) 25   Ht 5' 3"  (1.6 m)   Wt 79.4 kg (175 lb 0.7 oz)   SpO2 100%   BMI 31.01 kg/m   HEMODYNAMICS:    VENTILATOR SETTINGS: Vent Mode: PRVC FiO2 (%):  [40 %-60 %] 40 % Set Rate:  [28 bmp] 28 bmp Vt Set:  [420 mL] 420 mL PEEP:  [5 cmH20] 5 cmH20 Plateau Pressure:  [12 cmH20-18 cmH20] 17 cmH20  INTAKE / OUTPUT: I/O last 3 completed shifts: In: 8125.8 [I.V.:5486.5; NG/GT:1169.3; IV Piggyback:1470] Out: 788 [Urine:388; Emesis/NG output:400]  PHYSICAL EXAMINATION:  General Appearance:    Looks criticall illl + frail  Head:    Normocephalic, without obvious abnormality, atraumatic  Eyes:    PERRL - yes, conjunctiva/corneas - clear      Ears:    Normal  external ear canals, both ears  Nose:   NG tube - no  Throat:  ETT TUBE - yes , OG tube - y3s  Neck:   Supple,  No enlargement/tenderness/nodules     Lungs:     Clear to auscultation bilaterally, Ventilator   Synchrony - yes  Chest wall:    No deformity  Heart:    S1 and S2 normal, no murmur, CVP - no.  Pressors - no  Abdomen:     Soft, no masses, no organomegaly  Genitalia:    Not done  Rectal:   not done  Extremities:   Extremities- intact     Skin:   Intact in exposed areas .     Neurologic:   Sedation - jsut off fent gtt -> RASS - -3 . Moves all 4s - yes. CAM-ICU - unable to test . Orientation - unqable to teest      LABS: PULMONARY Recent Labs  Lab 11/01/17 1608 11/01/17 2040 11/02/17 0321  PHART 7.104* 7.345* 7.371  PCO2ART 27.6* 30.0* 27.9*  PO2ART 188.0* 99.0 264.0*  HCO3 8.7* 16.3* 16.1*  TCO2 10* 17* 17*  O2SAT 99.0 97.0 100.0    CBC Recent Labs  Lab 11/01/17 1533 11/01/17 2109 11/02/17 0437 11/03/17 0529  HGB 8.7* 8.1* 7.5* 7.3*  HCT 29.0* 26.4* 24.2* 23.8*  WBC 9.4  --  13.3* 15.2*  PLT 427*  --  312 239  COAGULATION Recent Labs  Lab 11/01/17 1533  INR 1.14    CARDIAC  No results for input(s): TROPONINI in the last 168 hours. No results for input(s): PROBNP in the last 168 hours.   CHEMISTRY Recent Labs  Lab 10/27/17 1103 11/01/17 1533 11/01/17 2108 11/01/17 2109 11/02/17 0437 11/02/17 1258 11/02/17 1642 11/03/17 0529  NA 142 140 140  --  141  --   --  142  K 3.5 3.5 3.4*  --  3.5  --   --  3.6  CL 110* 110 112*  --  115*  --   --  116*  CO2 21* 9* 17*  --  16*  --   --  16*  GLUCOSE 93 145* 123*  --  120*  --   --  92  BUN 22 22* 23*  --  21*  --   --  23*  CREATININE 1.71* 2.01* 1.79*  --  1.76*  --   --  2.10*  CALCIUM 9.9 9.5 8.6*  --  8.4*  --   --  9.5  MG  --   --   --  1.0* 1.5* 1.7 1.9 1.8  PHOS  --   --  2.7  --  2.1* 2.6 2.7 3.3   Estimated Creatinine Clearance: 21.3 mL/min (A) (by C-G formula based on SCr of  2.1 mg/dL (H)).   LIVER Recent Labs  Lab 10/27/17 1103 11/01/17 1533 11/01/17 2108  AST 10 41  --   ALT 7 13*  --   ALKPHOS 92 98  --   BILITOT 0.3 0.3  --   PROT 6.5 6.0*  --   ALBUMIN 2.5* 2.6* 2.2*  INR  --  1.14  --      INFECTIOUS Recent Labs  Lab 11/01/17 1545 11/01/17 2109 11/02/17 0437 11/03/17 0529  LATICACIDVEN 11.24* 1.8 1.1  --   PROCALCITON  --  4.82 11.09 12.23     ENDOCRINE CBG (last 3)  Recent Labs    11/02/17 2339 11/03/17 0354 11/03/17 0723  GLUCAP 99 85 85         IMAGING x48h  - image(s) personally visualized  -   highlighted in bold Ct Head Wo Contrast  Result Date: 11/01/2017 CLINICAL DATA:  Focal neuro deficit suspect stroke EXAM: CT HEAD WITHOUT CONTRAST TECHNIQUE: Contiguous axial images were obtained from the base of the skull through the vertex without intravenous contrast. COMPARISON:  CT head 05/26/2015 FINDINGS: Brain: Moderate atrophy. Negative for hydrocephalus. Hypodensity left parietal lobe compatible with chronic infarct which has occurred since the prior study. Mild chronic microvascular ischemic change in the white matter. Negative for acute infarct, hemorrhage, or mass. Vascular: Negative for hyperdense vessel Skull: Negative Sinuses/Orbits: Bilateral cataract removal. Paranasal sinuses clear. Other: None IMPRESSION: Atrophy and chronic ischemia including chronic infarct left parietal lobe. No acute abnormality. Electronically Signed   By: Franchot Gallo M.D.   On: 11/01/2017 16:49   Mr Brain Wo Contrast  Result Date: 11/02/2017 CLINICAL DATA:  Seizure. History of renal cell carcinoma, metastatic colon cancer, and stroke. EXAM: MRI HEAD WITHOUT CONTRAST TECHNIQUE: Multiplanar, multiecho pulse sequences of the brain and surrounding structures were obtained without intravenous contrast. COMPARISON:  Head CT 11/01/2017 and MRI 05/24/2017 FINDINGS: Brain: There is no evidence of acute infarct, intracranial mass effect, or  extra-axial fluid collection. A chronic left parieto-occipital infarct is noted. There is also a small chronic left temporoparietal infarct along the posterior margin of the sylvian fissure. Small  chronic infarcts are present in both cerebellar hemispheres, with acute cerebellar infarcts present on the prior MRI. Scattered small foci of T2 hyperintensity in the cerebral white matter bilaterally are similar to the prior MRI and nonspecific but compatible with mild chronic small vessel ischemic disease. A 3 mm focus of signal abnormality in the posterior subcortical left frontal lobe demonstrates central T2 hyperintensity, a hemosiderin ring, and prominent susceptibility artifact most compatible with a cavernoma. There is also likely an adjacent small developmental venous anomaly. Moderate cerebral atrophy is noted. Dedicated temporal lobe imaging demonstrates symmetric volume of the hippocampi without focal signal abnormality. The pituitary gland is minimally prominent in size for age, measuring 8 mm in height with a minimally convex superior margin. Vascular: Major intracranial vascular flow voids are preserved. Skull and upper cervical spine: Unremarkable bone marrow signal. Sinuses/Orbits: Bilateral cataract extraction. Trace right mastoid fluid. At most minimal bilateral ethmoid air cell mucosal thickening. Other: None. IMPRESSION: 1. No acute intracranial abnormality. 2. Chronic ischemic changes including chronic left parieto-occipital and cerebellar infarcts. Electronically Signed   By: Logan Bores M.D.   On: 11/02/2017 19:44   Dg Chest Port 1 View  Result Date: 11/03/2017 CLINICAL DATA:  83 y/o  F; ET tube position. EXAM: PORTABLE CHEST 1 VIEW COMPARISON:  11/02/2017 chest radiograph FINDINGS: Low lung volumes accentuate pulmonary markings. Right greater than left basilar opacities are stable. Stable cardiac silhouette. Endotracheal tube tip 2.2 cm above carina. Enteric tube tip below field of view and  abdomen. Right port catheter tip projects over lower SVC and is stable. No acute osseous abnormality is evident. IMPRESSION: 1. Endotracheal tube tip projects 2.2 cm above carina, endotracheal tube below field of view and abdomen. 2. Stable low lung volumes and basilar opacities. Electronically Signed   By: Kristine Garbe M.D.   On: 11/03/2017 04:08   Dg Chest Port 1 View  Result Date: 11/02/2017 CLINICAL DATA:  Hypoxia EXAM: PORTABLE CHEST 1 VIEW COMPARISON:  November 01, 2017 FINDINGS: Endotracheal tube tip is 3.1 cm above the carina. Central catheter tip is in superior vena cava. Nasogastric tube tip is below the diaphragm with side port in stomach. No pneumothorax. There is airspace consolidation in the right lower lobe with small right pleural effusion. There is consolidation in the medial left base as well. Heart size and pulmonary vascularity are normal. No adenopathy. No bone lesions. IMPRESSION: Tube and catheter positions as described without pneumothorax. Consolidation both lung zones, considerably more on the right than on the left. Heart size within normal limits. Electronically Signed   By: Lowella Grip III M.D.   On: 11/02/2017 08:10   Dg Chest Port 1 View  Result Date: 11/01/2017 CLINICAL DATA:  Altered mental status EXAM: PORTABLE CHEST 1 VIEW COMPARISON:  08/11/2017 FINDINGS: Right Port-A-Cath remains in place, unchanged. Endotracheal tube is 3.2 cm above the carina. NG tube is in the stomach. Mild elevation of the right hemidiaphragm. Right lower lobe airspace opacity concerning for consolidation. Rounded masses and nodules noted in both lungs, similar to prior study compatible with metastatic disease. Left base atelectasis or early infiltrate. No visible effusions. IMPRESSION: Bilateral pulmonary nodules and masses compatible with metastases. Or confluent opacity now present in the right lung base concerning for consolidation/pneumonia. Left base atelectasis or early  infiltrate. Endotracheal tube 3.2 cm above the carina. Electronically Signed   By: Rolm Baptise M.D.   On: 11/01/2017 16:17      DISCUSSION: 83 y/o female with metastatic colon cancer here  with status epilepticus  ASSESSMENT / PLAN:  PULMONARY A: Acute respiratory failure with hypoxemia Aspiration pneumonia Lung mets from colon cancer   11/03/2017 - > does NOT meet criteria for SBT/Extubation in setting of Acute Respiratory Failure due to sedation over hang   P:   Full mechanical vent support VAP prevention Daily WUA/SBT Extuate when feasible   CARDIOVASCULAR A:  Hypertension baseline, soft BP here  11/03/2017 - normal BP/HR  P:  Tele Hold home home antihypertensives Bubble study - await  RENAL A:   Chronic kidney failure    11/03/2017 - mag slight tlow P:   repelete 1gm mag sulfate Monitor BMET and UOP Replace electrolytes as needed Dc saline   GASTROINTESTINAL A:   No acute issues P:   Continue TF Pantoprazole for stress ulcer prophylaxis  HEMATOLOGIC A:   Anemia History of DVT Met colon cancer on treatment P:  On lovenox Rx dose  INFECTIOUS A:   RLL aspiration pneumonia - allergic rx to ceftriaaxone 11/02/17 (QTc 456 msec 11/02/17) P:   Anti-infectives (From admission, onward)   Start     Dose/Rate Route Frequency Ordered Stop   11/03/17 1800  levofloxacin (LEVAQUIN) IVPB 500 mg  Status:  Discontinued     500 mg 100 mL/hr over 60 Minutes Intravenous Every 48 hours 11/01/17 1658 11/01/17 1851   11/03/17 1700  levofloxacin (LEVAQUIN) IVPB 750 mg  Status:  Discontinued     750 mg 100 mL/hr over 90 Minutes Intravenous  Once 11/02/17 0215 11/02/17 0554   11/03/17 1700  levofloxacin (LEVAQUIN) IVPB 500 mg     500 mg 100 mL/hr over 60 Minutes Intravenous Every 48 hours 11/02/17 0554     11/02/17 1800  vancomycin (VANCOCIN) IVPB 750 mg/150 ml premix  Status:  Discontinued     750 mg 150 mL/hr over 60 Minutes Intravenous Every 24 hours 11/01/17  1658 11/01/17 1851   11/02/17 1000  metroNIDAZOLE (FLAGYL) IVPB 500 mg  Status:  Discontinued     500 mg 100 mL/hr over 60 Minutes Intravenous Every 8 hours 11/02/17 0215 11/02/17 1006   11/02/17 0230  metroNIDAZOLE (FLAGYL) IVPB 500 mg     500 mg 100 mL/hr over 60 Minutes Intravenous  Once 11/02/17 0215 11/02/17 0417   11/02/17 0200  aztreonam (AZACTAM) 1 g in sodium chloride 0.9 % 100 mL IVPB  Status:  Discontinued     1 g 200 mL/hr over 30 Minutes Intravenous Every 8 hours 11/01/17 1658 11/01/17 1851   11/01/17 2200  cefTRIAXone (ROCEPHIN) 1 g in sodium chloride 0.9 % 100 mL IVPB  Status:  Discontinued     1 g 200 mL/hr over 30 Minutes Intravenous Every 24 hours 11/01/17 1904 11/02/17 0204   11/01/17 1630  levofloxacin (LEVAQUIN) IVPB 750 mg     750 mg 100 mL/hr over 90 Minutes Intravenous  Once 11/01/17 1621 11/01/17 1845   11/01/17 1630  aztreonam (AZACTAM) 2 g in sodium chloride 0.9 % 100 mL IVPB     2 g 200 mL/hr over 30 Minutes Intravenous  Once 11/01/17 1621 11/01/17 1743   11/01/17 1630  vancomycin (VANCOCIN) IVPB 1000 mg/200 mL premix  Status:  Discontinued     1,000 mg 200 mL/hr over 60 Minutes Intravenous  Once 11/01/17 1621 11/01/17 1626   11/01/17 1630  vancomycin (VANCOCIN) 1,250 mg in sodium chloride 0.9 % 250 mL IVPB     1,250 mg 166.7 mL/hr over 90 Minutes Intravenous  Once 11/01/17 1626 11/01/17 1834  ENDOCRINE A:   Hypothyroidism P:   Change synthroid to oral  NEUROLOGIC A:   Status epilepticus Sedation need for vent synchrony Multiple strokes seen on MRI brain 05/2017   - neuro following. Chronic parieto occipital infarcts left side on MRI On keppra P:   RASS goal: 0 DC fent gtt Continue keppra Do prn sedation If needed do diprivan gtt  FAMILY  - Updates: updated son by phone 3/26. None at bedside 11/03/2017   -The patient is critically ill with multiple organ systems failure and requires high complexity decision making for assessment  and support, frequent evaluation and titration of therapies, application of advanced monitoring technologies and extensive interpretation of multiple databases.   Critical Care Time devoted to patient care services described in this note is  30  Minutes. This time reflects time of care of this signee Dr Brand Males. This critical care time does not reflect procedure time, or teaching time or supervisory time of PA/NP/Med student/Med Resident etc but could involve care discussion time    Dr. Brand Males, M.D., [facility].C.P Pulmonary and Critical Care Medicine Staff Physician [place] [facility] Pager: [phone], If no answer or between  15:00h - 7:00h: call [phone]  11/03/2017 9:26 AM

## 2017-11-03 NOTE — Progress Notes (Addendum)
Subjective: Intubated on no sedation, not breathing over vent.   Exam: Vitals:   11/03/17 0800 11/03/17 0805  BP: 108/78 118/71  Pulse: (!) 122 (!) 123  Resp: (!) 28 (!) 25  Temp:    SpO2: 100% 100%    Physical Exam   HEENT-  Normocephalic, no lesions, without obvious abnormality.  Normal external eye and conjunctiva.   Cardiovascular- S1-S2 audible, pulses palpable throughout   Lungs-no rhonchi or wheezing noted, no excessive working breathing.  Saturations within normal limits Abdomen- All 4 quadrants palpated and nontender Extremities- Warm, dry and intact Musculoskeletal-no joint tenderness, deformity or swelling Skin-warm and dry, no hyperpigmentation, vitiligo, or suspicious lesions    Neuro:  Mental Status: Alert, intubated, opens eyes and looks in all directions to command, lifts all extremities and sqeezes hands to command.  Cranial Nerves: II:  Visual fields grossly normal,  III,IV, VI: ptosis not present, extra-ocular motions intact bilaterally pupils equal, round, reactive to light and accommodation V,VII: face symmetric, facial light touch sensation normal bilaterally  Motor: Moving all extremities Sensory: Pinprick intact throughout, bilaterally Deep Tendon Reflexes: 2+ and symmetric throughout Plantars: Mild up going bilaterally     Medications:  Scheduled: . chlorhexidine gluconate (MEDLINE KIT)  15 mL Mouth Rinse BID  . Chlorhexidine Gluconate Cloth  6 each Topical Daily  . enoxaparin (LOVENOX) injection  80 mg Subcutaneous Q24H  . levothyroxine  75 mcg Per Tube QAC breakfast  . mouth rinse  15 mL Mouth Rinse 10 times per day  . pantoprazole sodium  40 mg Per Tube QHS  . sodium chloride flush  10-40 mL Intracatheter Q12H   Continuous: . sodium chloride    . feeding supplement (VITAL AF 1.2 CAL) 60 mL/hr at 11/03/17 0800  . levETIRAcetam Stopped (11/03/17 1245)  . levofloxacin (LEVAQUIN) IV    . magnesium sulfate 1 - 4 g bolus IVPB      YKD:XIPJAS chloride, fentaNYL (SUBLIMAZE) injection, fentaNYL (SUBLIMAZE) injection, midazolam, sodium chloride flush  Pertinent Labs/Diagnostics: none  Ct Head Wo Contrast  Mr Brain Wo Contrast  Result Date: 11/02/2017 CLINICAL DATA:  Seizure. History of renal cell carcinoma, metastatic colon cancer, and stroke. EXAM: MRI HEAD WITHOUT CONTRAST TECHNIQUE: Multiplanar, multiecho pulse sequences of the brain and surrounding structures were obtained without intravenous contrast. COMPARISON:  Head CT 11/01/2017 and MRI 05/24/2017 FINDINGS: Brain: There is no evidence of acute infarct, intracranial mass effect, or extra-axial fluid collection. A chronic left parieto-occipital infarct is noted. There is also a small chronic left temporoparietal infarct along the posterior margin of the sylvian fissure. Small chronic infarcts are present in both cerebellar hemispheres, with acute cerebellar infarcts present on the prior MRI. Scattered small foci of T2 hyperintensity in the cerebral white matter bilaterally are similar to the prior MRI and nonspecific but compatible with mild chronic small vessel ischemic disease. A 3 mm focus of signal abnormality in the posterior subcortical left frontal lobe demonstrates central T2 hyperintensity, a hemosiderin ring, and prominent susceptibility artifact most compatible with a cavernoma. There is also likely an adjacent small developmental venous anomaly. Moderate cerebral atrophy is noted. Dedicated temporal lobe imaging demonstrates symmetric volume of the hippocampi without focal signal abnormality. The pituitary gland is minimally prominent in size for age, measuring 8 mm in height with a minimally convex superior margin. Vascular: Major intracranial vascular flow voids are preserved. Skull and upper cervical spine: Unremarkable bone marrow signal. Sinuses/Orbits: Bilateral cataract extraction. Trace right mastoid fluid. At most minimal bilateral ethmoid air  cell mucosal  thickening. Other: None. IMPRESSION: 1. No acute intracranial abnormality. 2. Chronic ischemic changes including chronic left parieto-occipital and cerebellar infarcts. Electronically Signed   By: Logan Bores M.D.   On: 11/02/2017 19:44       Etta Quill PA-C [facility] [phone]   Impression:   Status Epilepticus/Multiple seizures : now  resolved   83 y.o.femalewith metastatic colon cancer, prior stroke on CT scanpresented to the hospital status post 2 back-to-back tonic-clonic seizureswithout return to baseline Patient hasnever had seizures in the past. Patient intubatedPCCM is treating aspiration pneumonia. Currently on Keppra 500 mg BID with no further seizures. She is following commands.  Still on Ventilator but on no sedation. MRI brain shows no acute intracranial abnormality   Recommendations  -Continue maintenance dose of Keppra 500 mg twice daily( renal dosing)  -- Ventilator support per PCCM -- Outpatient neurology follow up for seizures   At this time neurology will S/O--Feel free to call with questions  11/03/2017, 9:21 AM    NEUROHOSPITALIST ADDENDUM Seen and examined the patient today. I have reviewed the contents of note documented above.  Agree with the Formulated plan as documented above by PAC/Resident.  Will follow.  Karena Addison Aroor MD [facility] [phone]  If 7pm to 7am, please call on call as listed on AMION.

## 2017-11-03 NOTE — Progress Notes (Signed)
Pt with mitts on grabbed ETT and attempted to pull out. Simpson, NP was on unit and ordered restraints and ok 2 of versed to be given. Chest Xray was ordered because pt did have a strong pull on tube. Breath sounds are equal bilaterally and no changes on vent.

## 2017-11-03 NOTE — Progress Notes (Signed)
PCCM Interval Progress Note  Events:  Nausea.  Had vomiting x 2 earlier this afternoon.  Interventions:  Zofran 4mg  q6hrs PRN ordered.   Montey Hora, [place] [facility] Pager: [phone]  or [phone] 11/03/2017, 5:46 PM

## 2017-11-03 NOTE — Plan of Care (Signed)
Pt required restraints to be added r/t attempting to pull ETT out

## 2017-11-04 ENCOUNTER — Inpatient Hospital Stay (HOSPITAL_COMMUNITY): Payer: BC Managed Care – PPO

## 2017-11-04 LAB — GLUCOSE, CAPILLARY
Glucose-Capillary: 84 mg/dL (ref 65–99)
Glucose-Capillary: 85 mg/dL (ref 65–99)
Glucose-Capillary: 88 mg/dL (ref 65–99)
Glucose-Capillary: 92 mg/dL (ref 65–99)
Glucose-Capillary: 98 mg/dL (ref 65–99)
Glucose-Capillary: 99 mg/dL (ref 65–99)

## 2017-11-04 LAB — CBC WITH DIFFERENTIAL/PLATELET
BASOS PCT: 0 %
Basophils Absolute: 0 10*3/uL (ref 0.0–0.1)
EOS ABS: 0 10*3/uL (ref 0.0–0.7)
EOS PCT: 0 %
HEMATOCRIT: 22.2 % — AB (ref 36.0–46.0)
Hemoglobin: 7.1 g/dL — ABNORMAL LOW (ref 12.0–15.0)
Lymphocytes Relative: 7 %
Lymphs Abs: 0.9 10*3/uL (ref 0.7–4.0)
MCH: 27.1 pg (ref 26.0–34.0)
MCHC: 32 g/dL (ref 30.0–36.0)
MCV: 84.7 fL (ref 78.0–100.0)
MONOS PCT: 3 %
Monocytes Absolute: 0.3 10*3/uL (ref 0.1–1.0)
Neutro Abs: 11 10*3/uL — ABNORMAL HIGH (ref 1.7–7.7)
Neutrophils Relative %: 90 %
Platelets: 225 10*3/uL (ref 150–400)
RBC: 2.62 MIL/uL — ABNORMAL LOW (ref 3.87–5.11)
RDW: 20.5 % — ABNORMAL HIGH (ref 11.5–15.5)
WBC: 12.3 10*3/uL — ABNORMAL HIGH (ref 4.0–10.5)

## 2017-11-04 LAB — BASIC METABOLIC PANEL
ANION GAP: 8 (ref 5–15)
BUN: 23 mg/dL — AB (ref 6–20)
CALCIUM: 9.5 mg/dL (ref 8.9–10.3)
CO2: 16 mmol/L — ABNORMAL LOW (ref 22–32)
Chloride: 118 mmol/L — ABNORMAL HIGH (ref 101–111)
Creatinine, Ser: 2.09 mg/dL — ABNORMAL HIGH (ref 0.44–1.00)
GFR calc Af Amer: 25 mL/min — ABNORMAL LOW (ref >60)
GFR, EST NON AFRICAN AMERICAN: 21 mL/min — AB (ref >60)
GLUCOSE: 92 mg/dL (ref 65–99)
Potassium: 3.4 mmol/L — ABNORMAL LOW (ref 3.5–5.1)
SODIUM: 142 mmol/L (ref 135–145)

## 2017-11-04 LAB — MAGNESIUM: MAGNESIUM: 2 mg/dL (ref 1.7–2.4)

## 2017-11-04 MED ORDER — LEVETIRACETAM 500 MG PO TABS
500.0000 mg | ORAL_TABLET | Freq: Two times a day (BID) | ORAL | Status: DC
  Administered 2017-11-04 – 2017-11-08 (×8): 500 mg via ORAL
  Filled 2017-11-04 (×8): qty 1

## 2017-11-04 MED ORDER — ORAL CARE MOUTH RINSE
15.0000 mL | Freq: Two times a day (BID) | OROMUCOSAL | Status: DC
  Administered 2017-11-04: 15 mL via OROMUCOSAL

## 2017-11-04 MED ORDER — ORAL CARE MOUTH RINSE
15.0000 mL | Freq: Two times a day (BID) | OROMUCOSAL | Status: DC
  Administered 2017-11-05 – 2017-11-07 (×4): 15 mL via OROMUCOSAL

## 2017-11-04 MED ORDER — DEXTROSE IN LACTATED RINGERS 5 % IV SOLN
INTRAVENOUS | Status: DC
  Administered 2017-11-04 (×2): via INTRAVENOUS

## 2017-11-04 MED ORDER — PANTOPRAZOLE SODIUM 40 MG PO TBEC
40.0000 mg | DELAYED_RELEASE_TABLET | Freq: Every day | ORAL | Status: DC
  Administered 2017-11-04: 40 mg via ORAL
  Filled 2017-11-04: qty 1

## 2017-11-04 MED ORDER — CHLORHEXIDINE GLUCONATE 0.12 % MT SOLN: 15.0000 mL | Freq: Two times a day (BID) | OROMUCOSAL | Status: DC

## 2017-11-04 MED ORDER — POTASSIUM CHLORIDE 20 MEQ/15ML (10%) PO SOLN
20.0000 meq | Freq: Once | ORAL | Status: AC
  Administered 2017-11-04: 20 meq via ORAL
  Filled 2017-11-04: qty 15

## 2017-11-04 MED ORDER — CHLORHEXIDINE GLUCONATE 0.12 % MT SOLN
15.0000 mL | Freq: Two times a day (BID) | OROMUCOSAL | Status: DC
  Administered 2017-11-04 – 2017-11-08 (×7): 15 mL via OROMUCOSAL
  Filled 2017-11-04 (×5): qty 15

## 2017-11-04 NOTE — Progress Notes (Signed)
No charge note.  Met w/ patient and son, TJ. Introduced role of palliative care. TJ would like his brother to be involved in [place] meeting and asked that we all meet together tomorrow - mid morning.   Thank you for this consult.  Juel Burrow, DNP, AGNP-C Palliative Medicine Team Team Phone # [phone]

## 2017-11-04 NOTE — Progress Notes (Signed)
Ct reviewed - similar to 1 month ago but findings can explain vomit/nause  Will extubate Await pall care  Dr. Brand Males, M.D., [facility].C.P Pulmonary and Critical Care Medicine Staff Physician, [place] Director - Interstitial Lung Disease  Program  Pulmonary [place] [facility] [place], [place], [address]  Pager: [phone], If no answer or between  15:00h - 7:00h: call [phone] Telephone: [phone]

## 2017-11-04 NOTE — Progress Notes (Signed)
PULMONARY / CRITICAL CARE MEDICINE   Name: Shawna Schneider MRN: [id number] DOB: [birth year]    ADMISSION DATE:  11/01/2017 CONSULTATION DATE:  11/01/17  REFERRING MD:  EDP  CHIEF COMPLAINT:  Confusion seizures  BRIEF 83 yo female with a history of renal cell carcinoma, now colon cancer who was admitted with status epilepticus.    STUDIES:  CT head 3/25 > Atrophy and chronic ischemia including chronic infarct left parietal lobe. No acute abnormality. MRI brain 05/2017 >>> small acute/early subacute infarct left pot insula, scattered punctate reduced diffuseion likely embolic phenomena, late subacute infarct left occip lobe, moderate chronic volume loss brain MRI brain 3/26 >   CULTURES: Urine 3/25 > Blood 3/25 > Tracheal aspirate 3/25 >>>  ANTIBIOTICS: Azactam, levaquin, vancomycin in ED Ceftriaxone 3/25 > 3/26 (lip swelling), flagyl 3/26 x1, levaquin 3/26 >   LINES/TUBES: ETT 3/25 > 3/25 admit for vent s/p seizures 3/26 - Lip swelling overnight with ceftriaxone overnight. Placed on leaquin 11/03/17 - now on TF Failed sBT due to apnea. Fent gtt just turned off though. No fever. On keppra. No breatkthrough seizures   SUBJECTIVE/OVERNIGHT/INTERVAL HX 11/04/2017 -> vomited yesterday and PCT rising. TF on hold. No fever. OG returns 700 cc - milky bile per RN. Doing SBT. Asking for extubation  VITAL SIGNS: BP 114/80   Pulse 94   Temp 98.2 F (36.8 C) (Bladder)   Resp 13   Ht 5' 3"  (1.6 m)   Wt 78.1 kg (172 lb 2.9 oz)   SpO2 100%   BMI 30.50 kg/m   HEMODYNAMICS:    VENTILATOR SETTINGS: Vent Mode: CPAP;PSV FiO2 (%):  [40 %] 40 % Set Rate:  [28 bmp] 28 bmp Vt Set:  [420 mL] 420 mL PEEP:  [5 cmH20] 5 cmH20 Pressure Support:  [5 cmH20] 5 cmH20 Plateau Pressure:  [16 cmH20-18 cmH20] 16 cmH20  INTAKE / OUTPUT: I/O last 3 completed shifts: In: 3780.1 [I.V.:2395.1; NG/GT:1185; IV Piggyback:200] Out: 67 [Urine:369; Emesis/NG output:450]  PHYSICAL  EXAMINATION:   General Appearance:    Looks chronic ill with ET tube  Head:    Normocephalic, without obvious abnormality, atraumatic  Eyes:    PERRL - yes, conjunctiva/corneas - clear      Ears:    Normal external ear canals, both ears  Nose:   NG tube - no  Throat:  ETT TUBE - yes , OG tube - yes  Neck:   Supple,  No enlargement/tenderness/nodules     Lungs:     Clear to auscultation bilaterally, Ventilator   Synchrony - yes on SBT  Chest wall:    No deformity  Heart:    S1 and S2 normal, no murmur, CVP - no.  Pressors - no  Abdomen:     Soft, no masses, no organomegaly  Genitalia:    Not done  Rectal:   not done  Extremities:   Extremities- intact     Skin:   Intact in exposed areas . Sacral area - not known     Neurologic:   Sedation - none there than prn -> RASS - +1 . Moves all 4s - yes. CAM-ICU - neg . Orientation - x3 + through ett tube        LABS: PULMONARY Recent Labs  Lab 11/01/17 1608 11/01/17 2040 11/02/17 0321  PHART 7.104* 7.345* 7.371  PCO2ART 27.6* 30.0* 27.9*  PO2ART 188.0* 99.0 264.0*  HCO3 8.7* 16.3* 16.1*  TCO2 10* 17* 17*  O2SAT 99.0 97.0 100.0  CBC Recent Labs  Lab 11/02/17 0437 11/03/17 0529 11/04/17 0400  HGB 7.5* 7.3* 7.1*  HCT 24.2* 23.8* 22.2*  WBC 13.3* 15.2* 12.3*  PLT 312 239 225    COAGULATION Recent Labs  Lab 11/01/17 1533  INR 1.14    CARDIAC  No results for input(s): TROPONINI in the last 168 hours. No results for input(s): PROBNP in the last 168 hours.   CHEMISTRY Recent Labs  Lab 11/01/17 1533  11/01/17 2108  11/02/17 0437 11/02/17 1258 11/02/17 1642 11/03/17 0529 11/03/17 1707 11/03/17 2336 11/04/17 0400  NA 140  --  140  --  141  --   --  142  --  142  --   K 3.5  --  3.4*  --  3.5  --   --  3.6  --  3.4*  --   CL 110  --  112*  --  115*  --   --  116*  --  118*  --   CO2 9*  --  17*  --  16*  --   --  16*  --  16*  --   GLUCOSE 145*  --  123*  --  120*  --   --  92  --  92  --   BUN 22*  --   23*  --  21*  --   --  23*  --  23*  --   CREATININE 2.01*  --  1.79*  --  1.76*  --   --  2.10*  --  2.09*  --   CALCIUM 9.5  --  8.6*  --  8.4*  --   --  9.5  --  9.5  --   MG  --   --   --    < > 1.5* 1.7 1.9 1.8 2.0  --  2.0  PHOS  --    < > 2.7  --  2.1* 2.6 2.7 3.3 3.0  --   --    < > = values in this interval not displayed.   Estimated Creatinine Clearance: 21.3 mL/min (A) (by C-G formula based on SCr of 2.09 mg/dL (H)).   LIVER Recent Labs  Lab 11/01/17 1533 11/01/17 2108  AST 41  --   ALT 13*  --   ALKPHOS 98  --   BILITOT 0.3  --   PROT 6.0*  --   ALBUMIN 2.6* 2.2*  INR 1.14  --      INFECTIOUS Recent Labs  Lab 11/01/17 1545 11/01/17 2109 11/02/17 0437 11/03/17 0529  LATICACIDVEN 11.24* 1.8 1.1  --   PROCALCITON  --  4.82 11.09 12.23     ENDOCRINE CBG (last 3)  Recent Labs    11/03/17 2308 11/04/17 0351 11/04/17 0722  GLUCAP 89 84 85         IMAGING x48h  - image(s) personally visualized  -   highlighted in bold Mr Brain Wo Contrast  Result Date: 11/02/2017 CLINICAL DATA:  Seizure. History of renal cell carcinoma, metastatic colon cancer, and stroke. EXAM: MRI HEAD WITHOUT CONTRAST TECHNIQUE: Multiplanar, multiecho pulse sequences of the brain and surrounding structures were obtained without intravenous contrast. COMPARISON:  Head CT 11/01/2017 and MRI 05/24/2017 FINDINGS: Brain: There is no evidence of acute infarct, intracranial mass effect, or extra-axial fluid collection. A chronic left parieto-occipital infarct is noted. There is also a small chronic left temporoparietal infarct along the posterior margin of the sylvian fissure. Small chronic infarcts are  present in both cerebellar hemispheres, with acute cerebellar infarcts present on the prior MRI. Scattered small foci of T2 hyperintensity in the cerebral white matter bilaterally are similar to the prior MRI and nonspecific but compatible with mild chronic small vessel ischemic disease. A 3 mm  focus of signal abnormality in the posterior subcortical left frontal lobe demonstrates central T2 hyperintensity, a hemosiderin ring, and prominent susceptibility artifact most compatible with a cavernoma. There is also likely an adjacent small developmental venous anomaly. Moderate cerebral atrophy is noted. Dedicated temporal lobe imaging demonstrates symmetric volume of the hippocampi without focal signal abnormality. The pituitary gland is minimally prominent in size for age, measuring 8 mm in height with a minimally convex superior margin. Vascular: Major intracranial vascular flow voids are preserved. Skull and upper cervical spine: Unremarkable bone marrow signal. Sinuses/Orbits: Bilateral cataract extraction. Trace right mastoid fluid. At most minimal bilateral ethmoid air cell mucosal thickening. Other: None. IMPRESSION: 1. No acute intracranial abnormality. 2. Chronic ischemic changes including chronic left parieto-occipital and cerebellar infarcts. Electronically Signed   By: Logan Bores M.D.   On: 11/02/2017 19:44   Dg Chest Port 1 View  Result Date: 11/04/2017 CLINICAL DATA:  Endotracheal tube EXAM: PORTABLE CHEST 1 VIEW COMPARISON:  11/03/2017 FINDINGS: Endotracheal tube in good position. Gastric tube enters the stomach with the tip not visualized. Port-A-Cath tip in the SVC unchanged Right lower lobe volume loss with elevated right hemidiaphragm and right lower lobe atelectasis/infiltrate unchanged. Mild left lower lobe airspace disease also unchanged. Negative for edema or effusion IMPRESSION: Endotracheal tube remains in good position Bibasilar airspace disease right greater than left unchanged. Electronically Signed   By: Franchot Gallo M.D.   On: 11/04/2017 07:23   Dg Chest Port 1 View  Result Date: 11/03/2017 CLINICAL DATA:  83 y/o  F; ET tube position. EXAM: PORTABLE CHEST 1 VIEW COMPARISON:  11/02/2017 chest radiograph FINDINGS: Low lung volumes accentuate pulmonary markings. Right  greater than left basilar opacities are stable. Stable cardiac silhouette. Endotracheal tube tip 2.2 cm above carina. Enteric tube tip below field of view and abdomen. Right port catheter tip projects over lower SVC and is stable. No acute osseous abnormality is evident. IMPRESSION: 1. Endotracheal tube tip projects 2.2 cm above carina, endotracheal tube below field of view and abdomen. 2. Stable low lung volumes and basilar opacities. Electronically Signed   By: Kristine Garbe M.D.   On: 11/03/2017 04:08      DISCUSSION: 83 y/o female with metastatic colon cancer here with status epilepticus  ASSESSMENT / PLAN:  PULMONARY A: Acute respiratory failure with hypoxemia Aspiration pneumonia Lung mets from colon cancer   11/04/2017 - >meets extubation criteria for acute resp failure but high NG returns can make it challengnig for reintubation risk post extubation   P:   VAP prevention Continue SBT -> do CT chest/abd -> then extubate   CARDIOVASCULAR A:  Hypertension baseline, soft BP here  11/04/2017 - normal BP/HR  P:  Tele Hold home home antihypertensives Bubble study - await ? Has order  RENAL A:   Chronic kidney failure     11/04/2017 - CKD continues  P:   Start D5 LR at 75cc/h (due to NPO and vomit)   GASTROINTESTINAL A:   Having vomit/increased OG returns Abd soft with ascietes without tenderness 11/04/2017  P:   NPO;  No TF Check CT abd/pelvis without contrast Pantoprazole for stress ulcer prophylaxis  HEMATOLOGIC A:   Anemia History of DVT Met colon cancer  on treatment P:  On lovenox Rx dose  INFECTIOUS A:   RLL aspiration pneumonia - allergic rx to ceftriaaxone 11/02/17 (QTc 456 msec 11/02/17) P:   Anti-infectives (From admission, onward)   Start     Dose/Rate Route Frequency Ordered Stop   11/03/17 1800  levofloxacin (LEVAQUIN) IVPB 500 mg  Status:  Discontinued     500 mg 100 mL/hr over 60 Minutes Intravenous Every 48 hours 11/01/17  1658 11/01/17 1851   11/03/17 1700  levofloxacin (LEVAQUIN) IVPB 750 mg  Status:  Discontinued     750 mg 100 mL/hr over 90 Minutes Intravenous  Once 11/02/17 0215 11/02/17 0554   11/03/17 1700  levofloxacin (LEVAQUIN) IVPB 500 mg     500 mg 100 mL/hr over 60 Minutes Intravenous Every 48 hours 11/02/17 0554 11/07/17 2359   11/02/17 1800  vancomycin (VANCOCIN) IVPB 750 mg/150 ml premix  Status:  Discontinued     750 mg 150 mL/hr over 60 Minutes Intravenous Every 24 hours 11/01/17 1658 11/01/17 1851   11/02/17 1000  metroNIDAZOLE (FLAGYL) IVPB 500 mg  Status:  Discontinued     500 mg 100 mL/hr over 60 Minutes Intravenous Every 8 hours 11/02/17 0215 11/02/17 1006   11/02/17 0230  metroNIDAZOLE (FLAGYL) IVPB 500 mg     500 mg 100 mL/hr over 60 Minutes Intravenous  Once 11/02/17 0215 11/02/17 0417   11/02/17 0200  aztreonam (AZACTAM) 1 g in sodium chloride 0.9 % 100 mL IVPB  Status:  Discontinued     1 g 200 mL/hr over 30 Minutes Intravenous Every 8 hours 11/01/17 1658 11/01/17 1851   11/01/17 2200  cefTRIAXone (ROCEPHIN) 1 g in sodium chloride 0.9 % 100 mL IVPB  Status:  Discontinued     1 g 200 mL/hr over 30 Minutes Intravenous Every 24 hours 11/01/17 1904 11/02/17 0204   11/01/17 1630  levofloxacin (LEVAQUIN) IVPB 750 mg     750 mg 100 mL/hr over 90 Minutes Intravenous  Once 11/01/17 1621 11/01/17 1845   11/01/17 1630  aztreonam (AZACTAM) 2 g in sodium chloride 0.9 % 100 mL IVPB     2 g 200 mL/hr over 30 Minutes Intravenous  Once 11/01/17 1621 11/01/17 1743   11/01/17 1630  vancomycin (VANCOCIN) IVPB 1000 mg/200 mL premix  Status:  Discontinued     1,000 mg 200 mL/hr over 60 Minutes Intravenous  Once 11/01/17 1621 11/01/17 1626   11/01/17 1630  vancomycin (VANCOCIN) 1,250 mg in sodium chloride 0.9 % 250 mL IVPB     1,250 mg 166.7 mL/hr over 90 Minutes Intravenous  Once 11/01/17 1626 11/01/17 1834        ENDOCRINE A:   Hypothyroidism P:   Change synthroid to  oral  NEUROLOGIC A:   Status epilepticus Sedation need for vent synchrony Multiple strokes seen on MRI brain 05/2017   - neuro signed off. Chronic parieto occipital infarcts left side on MRI On keppra. No seizure as of 11/04/2017 sinc admit  P:   RASS goal: 0 Continue keppra Do prn sedation If needed do diprivan gtt  FAMILY  - Updates: updated son by phone 3/26. None at bedside 11/04/2017. Palliative care consult called for goals of care - sons involved her care given cancer advanced      The patient is critically ill with multiple organ systems failure and requires high complexity decision making for assessment and support, frequent evaluation and titration of therapies, application of advanced monitoring technologies and extensive interpretation of multiple databases.  Critical Care Time devoted to patient care services described in this note is  30  Minutes. This time reflects time of care of this signee Dr Brand Males. This critical care time does not reflect procedure time, or teaching time or supervisory time of PA/NP/Med student/Med Resident etc but could involve care discussion time    Dr. Brand Males, M.D., [facility].C.P Pulmonary and Critical Care Medicine Staff Physician [facility] Pager: [phone], If no answer or between  15:00h - 7:00h: call [phone]  11/04/2017 9:37 AM

## 2017-11-04 NOTE — Progress Notes (Signed)
169ml Fentanyl wasted in the sink with Heide Guile, RN.

## 2017-11-04 NOTE — Plan of Care (Signed)
  Problem: Education: Goal: Knowledge of General Education information will improve Outcome: Progressing   Problem: Health Behavior/Discharge Planning: Goal: Ability to manage health-related needs will improve Outcome: Progressing   Problem: Clinical Measurements: Goal: Ability to maintain clinical measurements within normal limits will improve Outcome: Progressing Goal: Will remain free from infection Outcome: Progressing Goal: Diagnostic test results will improve Outcome: Progressing Goal: Respiratory complications will improve Outcome: Progressing Goal: Cardiovascular complication will be avoided Outcome: Progressing   Problem: Activity: Goal: Risk for activity intolerance will decrease Outcome: Progressing   Problem: Nutrition: Goal: Adequate nutrition will be maintained Outcome: Progressing   Problem: Coping: Goal: Level of anxiety will decrease Outcome: Progressing   Problem: Elimination: Goal: Will not experience complications related to bowel motility Outcome: Progressing Goal: Will not experience complications related to urinary retention Outcome: Progressing   Problem: Pain Managment: Goal: General experience of comfort will improve Outcome: Progressing   Problem: Safety: Goal: Ability to remain free from injury will improve Outcome: Progressing   Problem: Skin Integrity: Goal: Risk for impaired skin integrity will decrease Outcome: Progressing   Problem: Activity: Goal: Ability to tolerate increased activity will improve Outcome: Completed/Met   Problem: Respiratory: Goal: Ability to maintain a clear airway and adequate ventilation will improve Outcome: Completed/Met   Problem: Role Relationship: Goal: Method of communication will improve Outcome: Completed/Met

## 2017-11-04 NOTE — Procedures (Signed)
Extubation Procedure Note  Patient Details:   Name: Shawna Schneider DOB: 12-May-1935 MRN: [id number]   Airway Documentation:     Evaluation  O2 sats: stable throughout Complications: No apparent complications Patient did tolerate procedure well. Bilateral Breath Sounds: Clear   Yes pt able to vocalize.   Pt extubated at this time per MD order. Pt able to breathe around deflated cuff. No stridor noted. Pt placed on 4L [place] and tolerating well.   Irineo Axon [facility] 11/04/2017, 1:00 PM

## 2017-11-05 DIAGNOSIS — Z515 Encounter for palliative care: Secondary | ICD-10-CM

## 2017-11-05 DIAGNOSIS — Z7189 Other specified counseling: Secondary | ICD-10-CM

## 2017-11-05 DIAGNOSIS — C799 Secondary malignant neoplasm of unspecified site: Secondary | ICD-10-CM

## 2017-11-05 LAB — CBC WITH DIFFERENTIAL/PLATELET
BASOS ABS: 0 10*3/uL (ref 0.0–0.1)
Basophils Relative: 0 %
EOS ABS: 0 10*3/uL (ref 0.0–0.7)
Eosinophils Relative: 0 %
HCT: 21.2 % — ABNORMAL LOW (ref 36.0–46.0)
HEMOGLOBIN: 6.8 g/dL — AB (ref 12.0–15.0)
LYMPHS ABS: 0.7 10*3/uL (ref 0.7–4.0)
Lymphocytes Relative: 11 %
MCH: 27.5 pg (ref 26.0–34.0)
MCHC: 32.1 g/dL (ref 30.0–36.0)
MCV: 85.8 fL (ref 78.0–100.0)
Monocytes Absolute: 0.2 10*3/uL (ref 0.1–1.0)
Monocytes Relative: 3 %
NEUTROS PCT: 86 %
Neutro Abs: 5.6 10*3/uL (ref 1.7–7.7)
Platelets: 207 10*3/uL (ref 150–400)
RBC: 2.47 MIL/uL — AB (ref 3.87–5.11)
RDW: 21.1 % — ABNORMAL HIGH (ref 11.5–15.5)
WBC: 6.5 10*3/uL (ref 4.0–10.5)

## 2017-11-05 LAB — BLOOD CULTURE ID PANEL (REFLEXED)
ACINETOBACTER BAUMANNII: NOT DETECTED
CANDIDA ALBICANS: NOT DETECTED
CANDIDA KRUSEI: NOT DETECTED
CANDIDA PARAPSILOSIS: NOT DETECTED
Candida glabrata: NOT DETECTED
Candida tropicalis: NOT DETECTED
ENTEROCOCCUS SPECIES: NOT DETECTED
ESCHERICHIA COLI: NOT DETECTED
Enterobacter cloacae complex: NOT DETECTED
Enterobacteriaceae species: NOT DETECTED
Haemophilus influenzae: NOT DETECTED
KLEBSIELLA OXYTOCA: NOT DETECTED
Klebsiella pneumoniae: NOT DETECTED
LISTERIA MONOCYTOGENES: NOT DETECTED
Neisseria meningitidis: NOT DETECTED
PSEUDOMONAS AERUGINOSA: NOT DETECTED
Proteus species: NOT DETECTED
STAPHYLOCOCCUS AUREUS BCID: NOT DETECTED
STREPTOCOCCUS PNEUMONIAE: NOT DETECTED
STREPTOCOCCUS SPECIES: NOT DETECTED
Serratia marcescens: NOT DETECTED
Staphylococcus species: NOT DETECTED
Streptococcus agalactiae: NOT DETECTED
Streptococcus pyogenes: NOT DETECTED

## 2017-11-05 LAB — LIPASE, BLOOD: LIPASE: 30 U/L (ref 11–51)

## 2017-11-05 LAB — GLUCOSE, CAPILLARY
Glucose-Capillary: 102 mg/dL — ABNORMAL HIGH (ref 65–99)
Glucose-Capillary: 101 mg/dL — ABNORMAL HIGH (ref 65–99)
Glucose-Capillary: 90 mg/dL (ref 65–99)
Glucose-Capillary: 90 mg/dL (ref 65–99)
Glucose-Capillary: 102 mg/dL — ABNORMAL HIGH (ref 65–99)

## 2017-11-05 LAB — LACTIC ACID, PLASMA: Lactic Acid, Venous: 0.8 mmol/L (ref 0.5–1.9)

## 2017-11-05 LAB — CBC
HCT: 26.7 % — ABNORMAL LOW (ref 36.0–46.0)
HEMOGLOBIN: 8.8 g/dL — AB (ref 12.0–15.0)
MCH: 28.2 pg (ref 26.0–34.0)
MCHC: 33 g/dL (ref 30.0–36.0)
MCV: 85.6 fL (ref 78.0–100.0)
PLATELETS: 204 10*3/uL (ref 150–400)
RBC: 3.12 MIL/uL — AB (ref 3.87–5.11)
RDW: 19.4 % — ABNORMAL HIGH (ref 11.5–15.5)
WBC: 6.2 10*3/uL (ref 4.0–10.5)

## 2017-11-05 LAB — BASIC METABOLIC PANEL
ANION GAP: 8 (ref 5–15)
BUN: 21 mg/dL — ABNORMAL HIGH (ref 6–20)
CO2: 18 mmol/L — ABNORMAL LOW (ref 22–32)
Calcium: 10 mg/dL (ref 8.9–10.3)
Chloride: 118 mmol/L — ABNORMAL HIGH (ref 101–111)
Creatinine, Ser: 2.1 mg/dL — ABNORMAL HIGH (ref 0.44–1.00)
GFR, EST AFRICAN AMERICAN: 24 mL/min — AB (ref >60)
GFR, EST NON AFRICAN AMERICAN: 21 mL/min — AB (ref >60)
GLUCOSE: 124 mg/dL — AB (ref 65–99)
POTASSIUM: 3.5 mmol/L (ref 3.5–5.1)
SODIUM: 144 mmol/L (ref 135–145)

## 2017-11-05 LAB — PREPARE RBC (CROSSMATCH)

## 2017-11-05 LAB — MAGNESIUM: MAGNESIUM: 1.7 mg/dL (ref 1.7–2.4)

## 2017-11-05 MED ORDER — BOOST / RESOURCE BREEZE PO LIQD CUSTOM
1.0000 | Freq: Two times a day (BID) | ORAL | Status: DC
  Administered 2017-11-06 – 2017-11-08 (×3): 1 via ORAL

## 2017-11-05 MED ORDER — SODIUM CHLORIDE 0.9 % IV SOLN: Freq: Once | INTRAVENOUS | Status: AC

## 2017-11-05 MED ORDER — LEVOTHYROXINE SODIUM 75 MCG PO TABS
75.0000 ug | ORAL_TABLET | Freq: Every day | ORAL | Status: DC
  Administered 2017-11-06 – 2017-11-08 (×3): 75 ug via ORAL
  Filled 2017-11-05 (×4): qty 1

## 2017-11-05 MED ORDER — MAGNESIUM SULFATE IN D5W 1-5 GM/100ML-% IV SOLN
1.0000 g | Freq: Once | INTRAVENOUS | Status: AC
  Administered 2017-11-05: 1 g via INTRAVENOUS
  Filled 2017-11-05: qty 100

## 2017-11-05 MED ORDER — LEVOFLOXACIN 500 MG PO TABS
500.0000 mg | ORAL_TABLET | ORAL | Status: AC
  Administered 2017-11-05 – 2017-11-07 (×2): 500 mg via ORAL
  Filled 2017-11-05 (×2): qty 1

## 2017-11-05 NOTE — Consult Note (Signed)
Consultation Note Date: 11/05/2017   Patient Name: Shawna Schneider  DOB: Jun 17, 1935  MRN: [id number]  Age / Sex: 83 y.o., female  PCP: [name], MD Referring Physician: Brand Males, MD  Reason for Consultation: Establishing goals of care  HPI/Patient Profile: 83 y.o. female  with past medical history of renal cell carcinoma s/p nephrectomy 2006, metastatic colon ca last chemo 10/27/17, HTN, CKD, DVT, HLD, hypothyroidism, multiple strokes - last in Oct 2018 admitted on 11/01/2017 with status epilepticus. No prior history of seizures. She was intubated upon admission and extubated 3/28. Also being treated from aspiration pna. Hospital course complicated by episodes of vomiting while intubated. No more since extubated.   PMT consulted for [facility]  Clinical Assessment and Goals of Care: I have reviewed medical records including EPIC notes, labs and imaging, received report from bedside RN, assessed the patient and then met at the bedside along with patient's sons Tommie Raymond and TJ  to discuss diagnosis prognosis, GOC, EOL wishes, disposition and options.  I introduced Palliative Medicine as specialized medical care for people living with serious illness. It focuses on providing relief from the symptoms and stress of a serious illness. The goal is to improve quality of life for both the patient and the family.  We discussed a brief life review of the patient. 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 She has 2 sons. She tells me her faith is important to her.  As far as functional and nutritional status, she tells me she spends most of her time in bed. She is too tired to get up and do much. She is able to walk to the bathroom and able to bathe herself. She reports not eating much - decreased appetite for months.    We discussed her current illness and what it means in the larger context of her on-going co-morbidities.  We discussed her metastatic cancer and current chemotherapy treatment. She tells me she is unsure if she wants to continue treatment. She says "I'm so tired of being poked and prodded". Encouraged discussion with oncologist.  The difference between aggressive medical intervention and comfort care was considered in light of the patient's goals of care. She is not sure if she wants to discontinue all aggressive medical interventions but wants to think about it.  Concepts specific to code status were discussed. She tells me if needed, she would want to be reintubated. She also tells me she would NOT want chest compressions or defib. She would want to be allowed to pass naturally.   Hospice and Palliative Care services outpatient were explained and offered. She tells me she does not want to live at [facility]. I explained to her that she could live at home and receive hospice services. She tells me if she agrees to hospice then she is "just giving up". I explained how Hospice is an option for her if she decided she no longer wanted aggressive medical interventions and would like to shift the focus of her care to her comfort and quality of life. She tells me she is not ready for Hospice care at this time.   She tells me she is concerned about disposition because she is scared to go home alone but does not want to go to nursing facility. She is interested in home health. Her son is interested in palliative care outpatient, Mrs. Belzer agrees.   Questions and concerns were addressed. The family was encouraged to call with questions or  concerns.   Primary Decision Maker PATIENT    SUMMARY OF RECOMMENDATIONS   -changed to limited code - NO CPR, NO defib, intubation only - patient unsure about continuing cancer treatment - plans to discuss with oncologist - family would like outpatient palliative care services - Primary care physician will have to order palliative care outpatient - discharging provider  please add this to discharge summary  Code Status/Advance Care Planning:  Limited code - no CPR/defib   Symptom Management:   Per primary - denies pain  Palliative Prophylaxis:   Aspiration, Delirium Protocol and Frequent Pain Assessment  Additional Recommendations (Limitations, Scope, Preferences):  Full Scope Treatment - no CPR/defib  Psycho-social/Spiritual:   Desire for further Chaplaincy support:yes  Additional Recommendations: Education on Hospice  Prognosis:   Unable to determine - poor d/t metastatic cancer, poor functional status, poor appetite - albumin 2.2  Discharge Planning: Home with Palliative Services     Primary Diagnoses: Present on Admission: **None**   I have reviewed the medical record, interviewed the patient and family, and examined the patient. The following aspects are pertinent.  Past Medical History:  Diagnosis Date  . Cancer ([place])   . Hyperlipemia   . Hypertension   . Seizures ([place]) 11/01/2017   no hx before today  . Solitary kidney   . Stroke [facility]) 05/2017   pt received TPA and has no deficits   Social History   Socioeconomic History  . Marital status: Divorced    Spouse name: Not on file  . Number of children: Not on file  . Years of education: Not on file  . Highest education level: Not on file  Occupational History  . Not on file  Social Needs  . Financial resource strain: Not on file  . Food insecurity:    Worry: Not on file    Inability: Not on file  . Transportation needs:    Medical: Not on file    Non-medical: Not on file  Tobacco Use  . Smoking status: Never Smoker  . Smokeless tobacco: Never Used  Substance and Sexual Activity  . Alcohol use: No  . Drug use: No  . Sexual activity: Not on file  Lifestyle  . Physical activity:    Days per week: Not on file    Minutes per session: Not on file  . Stress: Not on file  Relationships  . Social connections:    Talks on phone: Not on file    Gets  together: Not on file    Attends religious service: Not on file    Active member of club or organization: Not on file    Attends meetings of clubs or organizations: Not on file    Relationship status: Not on file  Other Topics Concern  . Not on file  Social History Narrative  . Not on file   Family History  Problem Relation Age of Onset  . Hypertension Mother   . Hypertension Father    Scheduled Meds: . chlorhexidine  15 mL Mouth Rinse BID  . Chlorhexidine Gluconate Cloth  6 each Topical Daily  . enoxaparin (LOVENOX) injection  80 mg Subcutaneous Q24H  . levETIRAcetam  500 mg Oral BID  . levofloxacin  500 mg Oral Q48H  . [START ON 11/06/2017] levothyroxine  75 mcg Oral QAC breakfast  . mouth rinse  15 mL Mouth Rinse q12n4p  . sodium chloride flush  10-40 mL Intracatheter Q12H   Continuous Infusions: . sodium chloride    .  sodium chloride    . dextrose 5% lactated ringers 10 mL/hr at 11/05/17 1036  . feeding supplement (VITAL AF 1.2 CAL) Stopped (11/03/17 1045)  . magnesium sulfate 1 - 4 g bolus IVPB     PRN Meds:.sodium chloride, fentaNYL (SUBLIMAZE) injection, fentaNYL (SUBLIMAZE) injection, ondansetron (ZOFRAN) IV, sodium chloride flush Allergies  Allergen Reactions  . Ceftriaxone Swelling    Lip swelling  . Penicillins Other (See Comments)    Has patient had a PCN reaction causing immediate rash, facial/tongue/throat swelling, SOB or lightheadedness with hypotension: unkn Has patient had a PCN reaction causing severe rash involving mucus membranes or skin necrosis: unkn Has patient had a PCN reaction that required hospitalization: unkn Has patient had a PCN reaction occurring within the last 10 years: unkn If all of the above answers are "NO", then may proceed with Cephalosporin use.    Review of Systems  Constitutional: Positive for activity change.       Denies pain  Respiratory: Negative for shortness of breath and wheezing.   Cardiovascular: Negative for chest  pain and leg swelling.  Gastrointestinal: Negative for nausea and vomiting.  Neurological: Negative.     Physical Exam  Constitutional: She is oriented to person, place, and time. No distress.  HENT:  Head: Normocephalic and atraumatic.  Cardiovascular: Normal rate and regular rhythm.  Pulmonary/Chest: Effort normal. No respiratory distress.  Abdominal: Soft. There is no tenderness.  Neurological: She is alert and oriented to person, place, and time.  Skin: Skin is warm and dry.  Psychiatric: She has a normal mood and affect. Her speech is normal and behavior is normal.    Vital Signs: BP 121/84 (BP Location: Right Arm)   Pulse 85   Temp 98.4 F (36.9 C) (Bladder)   Resp (!) 22   Ht 5' 3"  (1.6 m)   Wt 79.5 kg (175 lb 4.3 oz)   SpO2 99%   BMI 31.05 kg/m  Pain Scale: 0-10   Pain Score: 0-No pain   SpO2: SpO2: 99 % O2 Device:SpO2: 99 % O2 Flow Rate: .O2 Flow Rate (L/min): 1 L/min  IO: Intake/output summary:   Intake/Output Summary (Last 24 hours) at 11/05/2017 1045 Last data filed at 11/05/2017 0800 Gross per 24 hour  Intake 1680 ml  Output 430 ml  Net 1250 ml    LBM:   Baseline Weight: Weight: 71.2 kg (157 lb) Most recent weight: Weight: 79.5 kg (175 lb 4.3 oz)     Palliative Assessment/Data: PPS 60%     Time In: 1230 Time Out: 1400 Time Total: 90 minutes Greater than 50%  of this time was spent counseling and coordinating care related to the above assessment and plan.  Juel Burrow, DNP, AGNP-C Palliative Medicine Team [phone]

## 2017-11-05 NOTE — Progress Notes (Signed)
PULMONARY / CRITICAL CARE MEDICINE   Name: Shawna Schneider MRN: [id number] DOB: 04/19/1935    ADMISSION DATE:  11/01/2017 CONSULTATION DATE:  11/01/17  REFERRING MD:  EDP  CHIEF COMPLAINT:  Confusion seizures  BRIEF 83 yo female with a history of renal cell carcinoma, now colon cancer who was admitted with status epilepticus.    STUDIES:  CT head 3/25 > Atrophy and chronic ischemia including chronic infarct left parietal lobe. No acute abnormality. MRI brain 05/2017 >>> small acute/early subacute infarct left pot insula, scattered punctate reduced diffuseion likely embolic phenomena, late subacute infarct left occip lobe, moderate chronic volume loss brain MRI brain 3/26 >   CULTURES: Urine 3/25 > Blood 3/25 > Tracheal aspirate 3/25 >>>  ANTIBIOTICS: Azactam, levaquin, vancomycin in ED Ceftriaxone 3/25 > 3/26 (lip swelling), flagyl 3/26 x1, levaquin 3/26 >   LINES/TUBES: ETT 3/25 > 3/25 admit for vent s/p seizures 3/26 - Lip swelling overnight with ceftriaxone overnight. Placed on leaquin 11/03/17 - now on TF Failed sBT due to apnea. Fent gtt just turned off though. No fever. On keppra. No breatkthrough seizures  11/04/2017 -> vomited yesterday and PCT rising. TF on hold. No fever. OG returns 700 cc - milky bile per RN. Doing SBT. Asking for extubation    SUBJECTIVE/OVERNIGHT/INTERVAL HX 11/05/17 - reamins extubated x 24h. Goals of care with plalliaitve care today; prelim likely partial code for now. CT abd/chest with malignant acites +, omental mets, pulmonary met - all similar to 1 month earlier . QTc - 425 msec on ekg today. She is hungry and wants to eat., SLigh  Low hgb < 7gm%  VITAL SIGNS: BP 121/84 (BP Location: Right Arm)   Pulse 85   Temp 98.4 F (36.9 C) (Bladder)   Resp (!) 22   Ht 5' 3"  (1.6 m)   Wt 79.5 kg (175 lb 4.3 oz)   SpO2 99%   BMI 31.05 kg/m   HEMODYNAMICS:    VENTILATOR SETTINGS: Vent Mode: CPAP;PSV FiO2 (%):  [40 %] 40 % PEEP:  [5  cmH20] 5 cmH20 Pressure Support:  [5 cmH20] 5 cmH20  INTAKE / OUTPUT: I/O last 3 completed shifts: In: 1745.3 [I.V.:1655.3; NG/GT:90] Out: 1638 [GYKZL:935; Emesis/NG output:850]  PHYSICAL EXAMINATION:    General Appearance:    Looks much better. No vomit over night. No nausea. FRAIL   Head:    Normocephalic, without obvious abnormality, atraumatic  Eyes:    PERRL - yes, conjunctiva/corneas - clear      Ears:    Normal external ear canals, both ears  Nose:   NG tube - no  Throat:  ETT TUBE - no , OG tube - no  Neck:   Supple,  No enlargement/tenderness/nodules     Lungs:     Clear to auscultation bilaterally,  Chest wall:    No deformity. Has R Portocath  Heart:    S1 and S2 normal, no murmur, CVP - no.  Pressors - no  Abdomen:     Soft, no masses, no organomegaly  Genitalia:    Not done  Rectal:   not done  Extremities:   Extremities- intact     Skin:   Intact in exposed areas .     Neurologic:   Sedation - none -> RASS - +1 . Moves all 4s -yes. CAM-ICU - neg . Orientation - x 3+         LABS: PULMONARY Recent Labs  Lab 11/01/17 1608 11/01/17 2040 11/02/17 0321  PHART  7.104* 7.345* 7.371  PCO2ART 27.6* 30.0* 27.9*  PO2ART 188.0* 99.0 264.0*  HCO3 8.7* 16.3* 16.1*  TCO2 10* 17* 17*  O2SAT 99.0 97.0 100.0    CBC Recent Labs  Lab 11/03/17 0529 11/04/17 0400 11/05/17 0500  HGB 7.3* 7.1* 6.8*  HCT 23.8* 22.2* 21.2*  WBC 15.2* 12.3* 6.5  PLT 239 225 207    COAGULATION Recent Labs  Lab 11/01/17 1533  INR 1.14    CARDIAC  No results for input(s): TROPONINI in the last 168 hours. No results for input(s): PROBNP in the last 168 hours.   CHEMISTRY Recent Labs  Lab 11/01/17 2108  11/02/17 0437 11/02/17 1258 11/02/17 1642 11/03/17 0529 11/03/17 1707 11/03/17 2336 11/04/17 0400 11/05/17 0500  NA 140  --  141  --   --  142  --  142  --  144  K 3.4*  --  3.5  --   --  3.6  --  3.4*  --  3.5  CL 112*  --  115*  --   --  116*  --  118*  --   118*  CO2 17*  --  16*  --   --  16*  --  16*  --  18*  GLUCOSE 123*  --  120*  --   --  92  --  92  --  124*  BUN 23*  --  21*  --   --  23*  --  23*  --  21*  CREATININE 1.79*  --  1.76*  --   --  2.10*  --  2.09*  --  2.10*  CALCIUM 8.6*  --  8.4*  --   --  9.5  --  9.5  --  10.0  MG  --    < > 1.5* 1.7 1.9 1.8 2.0  --  2.0 1.7  PHOS 2.7  --  2.1* 2.6 2.7 3.3 3.0  --   --   --    < > = values in this interval not displayed.   Estimated Creatinine Clearance: 21.3 mL/min (A) (by C-G formula based on SCr of 2.1 mg/dL (H)).   LIVER Recent Labs  Lab 11/01/17 1533 11/01/17 2108  AST 41  --   ALT 13*  --   ALKPHOS 98  --   BILITOT 0.3  --   PROT 6.0*  --   ALBUMIN 2.6* 2.2*  INR 1.14  --      INFECTIOUS Recent Labs  Lab 11/01/17 2109 11/02/17 0437 11/03/17 0529 11/04/17 2333  LATICACIDVEN 1.8 1.1  --  0.8  PROCALCITON 4.82 11.09 12.23  --      ENDOCRINE CBG (last 3)  Recent Labs    11/04/17 2334 11/05/17 0310 11/05/17 0718  GLUCAP 99 102* 101*         IMAGING x48h  - image(s) personally visualized  -   highlighted in bold Ct Abdomen Pelvis Wo Contrast  Result Date: 11/04/2017 CLINICAL DATA:  Abdominal distension, nausea and vomiting. History of metastatic colon cancer. EXAM: CT CHEST, ABDOMEN AND PELVIS WITHOUT CONTRAST TECHNIQUE: Multidetector CT imaging of the chest, abdomen and pelvis was performed following the standard protocol without IV contrast. COMPARISON:  CT scan 09/27/2017 FINDINGS: CT CHEST FINDINGS Cardiovascular: The heart is normal in size. No pericardial effusion. The aorta is normal in caliber. No dissection. Stable coronary artery calcifications. Mediastinum/Nodes: No mediastinal or hilar mass or adenopathy. Small scattered lymph nodes are stable. The endotracheal tube is  in good position above the carina and the NG tube is coursing down the esophagus and into the stomach. Lungs/Pleura: Stable pulmonary metastatic disease when compared to the  recent prior CT scan. Index lesion in the left upper lobe measures 17 x 16 mm and previously measured 17 x 17 mm. Index lesion in the right upper lobe near the hilum measures 23 x 20 mm and previous measured 21 x 20 mm. No new pulmonary lesions. There are small bilateral pleural effusions with overlying atelectasis, right greater than left. Musculoskeletal: No breast masses, supraclavicular or axillary adenopathy. The bony structures are unremarkable and stable. CT ABDOMEN PELVIS FINDINGS Hepatobiliary: Stable peritoneal surface lesions involving the liver. No new lesions. Large volume ascites with mass effect on the liver. No intrahepatic biliary dilatation. The gallbladder is distended. No common bile duct dilatation. Pancreas: No mass, inflammation or ductal dilatation. Moderate pancreatic atrophy. Spleen: Small spleen.  No focal lesions. Adrenals/Urinary Tract: Status post left nephrectomy. Stable left adrenal gland adenoma. The right adrenal gland is unremarkable. The right kidney demonstrates multiple hyperdense/hemorrhagic cysts. Stomach/Bowel: The stomach, duodenum, small bowel and colon are grossly normal. No obstructive findings. Vascular/Lymphatic: Extensive and relatively stable appearing omental and peritoneal surface disease in the abdomen and pelvis. No significant change since the study 1 month ago. Stable atherosclerotic calcifications involving the aorta iliac arteries. Reproductive: The uterus and ovaries are not demonstrated. Suspect prior hysterectomy. Other: Stable pelvic tumor and large volume ascites. Diffuse and marked body wall edema. Musculoskeletal: No significant bony findings. IMPRESSION: 1. Large volume abdominal/pelvic ascites and extensive omental and peritoneal disease throughout the abdomen and pelvis not significantly changed since recent prior study. 2. Stable pulmonary metastatic lesions. 3. Small bilateral pleural effusions and bibasilar atelectasis, right greater than left.  Electronically Signed   By: Marijo Sanes M.D.   On: 11/04/2017 11:02   Ct Chest Wo Contrast  Result Date: 11/04/2017 CLINICAL DATA:  Abdominal distension, nausea and vomiting. History of metastatic colon cancer. EXAM: CT CHEST, ABDOMEN AND PELVIS WITHOUT CONTRAST TECHNIQUE: Multidetector CT imaging of the chest, abdomen and pelvis was performed following the standard protocol without IV contrast. COMPARISON:  CT scan 09/27/2017 FINDINGS: CT CHEST FINDINGS Cardiovascular: The heart is normal in size. No pericardial effusion. The aorta is normal in caliber. No dissection. Stable coronary artery calcifications. Mediastinum/Nodes: No mediastinal or hilar mass or adenopathy. Small scattered lymph nodes are stable. The endotracheal tube is in good position above the carina and the NG tube is coursing down the esophagus and into the stomach. Lungs/Pleura: Stable pulmonary metastatic disease when compared to the recent prior CT scan. Index lesion in the left upper lobe measures 17 x 16 mm and previously measured 17 x 17 mm. Index lesion in the right upper lobe near the hilum measures 23 x 20 mm and previous measured 21 x 20 mm. No new pulmonary lesions. There are small bilateral pleural effusions with overlying atelectasis, right greater than left. Musculoskeletal: No breast masses, supraclavicular or axillary adenopathy. The bony structures are unremarkable and stable. CT ABDOMEN PELVIS FINDINGS Hepatobiliary: Stable peritoneal surface lesions involving the liver. No new lesions. Large volume ascites with mass effect on the liver. No intrahepatic biliary dilatation. The gallbladder is distended. No common bile duct dilatation. Pancreas: No mass, inflammation or ductal dilatation. Moderate pancreatic atrophy. Spleen: Small spleen.  No focal lesions. Adrenals/Urinary Tract: Status post left nephrectomy. Stable left adrenal gland adenoma. The right adrenal gland is unremarkable. The right kidney demonstrates multiple  hyperdense/hemorrhagic cysts.  Stomach/Bowel: The stomach, duodenum, small bowel and colon are grossly normal. No obstructive findings. Vascular/Lymphatic: Extensive and relatively stable appearing omental and peritoneal surface disease in the abdomen and pelvis. No significant change since the study 1 month ago. Stable atherosclerotic calcifications involving the aorta iliac arteries. Reproductive: The uterus and ovaries are not demonstrated. Suspect prior hysterectomy. Other: Stable pelvic tumor and large volume ascites. Diffuse and marked body wall edema. Musculoskeletal: No significant bony findings. IMPRESSION: 1. Large volume abdominal/pelvic ascites and extensive omental and peritoneal disease throughout the abdomen and pelvis not significantly changed since recent prior study. 2. Stable pulmonary metastatic lesions. 3. Small bilateral pleural effusions and bibasilar atelectasis, right greater than left. Electronically Signed   By: Marijo Sanes M.D.   On: 11/04/2017 11:02   Dg Chest Port 1 View  Result Date: 11/04/2017 CLINICAL DATA:  Endotracheal tube EXAM: PORTABLE CHEST 1 VIEW COMPARISON:  11/03/2017 FINDINGS: Endotracheal tube in good position. Gastric tube enters the stomach with the tip not visualized. Port-A-Cath tip in the SVC unchanged Right lower lobe volume loss with elevated right hemidiaphragm and right lower lobe atelectasis/infiltrate unchanged. Mild left lower lobe airspace disease also unchanged. Negative for edema or effusion IMPRESSION: Endotracheal tube remains in good position Bibasilar airspace disease right greater than left unchanged. Electronically Signed   By: Franchot Gallo M.D.   On: 11/04/2017 07:23      DISCUSSION: 83 y/o female with metastatic colon cancer here with status epilepticus  ASSESSMENT / PLAN:  PULMONARY A: Acute respiratory failure with hypoxemia Aspiration pneumonia Lung mets from colon cancer   11/05/2017 - >remains extubated x 24h   P:    HAP prevention pulm toilet    CARDIOVASCULAR A:  Hypertension baseline, soft BP here  11/05/2017 - normal BP/HR  P:  Tele Hold home home antihypertensives   RENAL A:   Chronic kidney failure     11/05/2017 - CKD continues, creat 2.1. Mag < 2gm%   P:    D5 LR at 75cc/h (due to NPO and vomit) -> reduce to [facility]   GASTROINTESTINAL A:   Having vomit/increased OG returns 3/27 - 3/28 but none since them CT with malignancy as above Abd soft with ascietes without tenderness 11/05/2017    P:   Advanced diet as tolerated No TF Dc Pantoprazole for stress ulcer prophylaxis  HEMATOLOGIC A:   Anemia History of DVT Met colon cancer on treatment P:  On lovenox Rx dose  INFECTIOUS A:   RLL aspiration pneumonia - allergic rx to ceftriaaxone 11/02/17 (QTc 456 msec 11/02/17) P:   Anti-infectives (From admission, onward)   Start     Dose/Rate Route Frequency Ordered Stop   11/03/17 1800  levofloxacin (LEVAQUIN) IVPB 500 mg  Status:  Discontinued     500 mg 100 mL/hr over 60 Minutes Intravenous Every 48 hours 11/01/17 1658 11/01/17 1851   11/03/17 1700  levofloxacin (LEVAQUIN) IVPB 750 mg  Status:  Discontinued     750 mg 100 mL/hr over 90 Minutes Intravenous  Once 11/02/17 0215 11/02/17 0554   11/03/17 1700  levofloxacin (LEVAQUIN) IVPB 500 mg     500 mg 100 mL/hr over 60 Minutes Intravenous Every 48 hours 11/02/17 0554 11/07/17 2359   11/02/17 1800  vancomycin (VANCOCIN) IVPB 750 mg/150 ml premix  Status:  Discontinued     750 mg 150 mL/hr over 60 Minutes Intravenous Every 24 hours 11/01/17 1658 11/01/17 1851   11/02/17 1000  metroNIDAZOLE (FLAGYL) IVPB 500 mg  Status:  Discontinued     500 mg 100 mL/hr over 60 Minutes Intravenous Every 8 hours 11/02/17 0215 11/02/17 1006   11/02/17 0230  metroNIDAZOLE (FLAGYL) IVPB 500 mg     500 mg 100 mL/hr over 60 Minutes Intravenous  Once 11/02/17 0215 11/02/17 0417   11/02/17 0200  aztreonam (AZACTAM) 1 g in sodium chloride 0.9 %  100 mL IVPB  Status:  Discontinued     1 g 200 mL/hr over 30 Minutes Intravenous Every 8 hours 11/01/17 1658 11/01/17 1851   11/01/17 2200  cefTRIAXone (ROCEPHIN) 1 g in sodium chloride 0.9 % 100 mL IVPB  Status:  Discontinued     1 g 200 mL/hr over 30 Minutes Intravenous Every 24 hours 11/01/17 1904 11/02/17 0204   11/01/17 1630  levofloxacin (LEVAQUIN) IVPB 750 mg     750 mg 100 mL/hr over 90 Minutes Intravenous  Once 11/01/17 1621 11/01/17 1845   11/01/17 1630  aztreonam (AZACTAM) 2 g in sodium chloride 0.9 % 100 mL IVPB     2 g 200 mL/hr over 30 Minutes Intravenous  Once 11/01/17 1621 11/01/17 1743   11/01/17 1630  vancomycin (VANCOCIN) IVPB 1000 mg/200 mL premix  Status:  Discontinued     1,000 mg 200 mL/hr over 60 Minutes Intravenous  Once 11/01/17 1621 11/01/17 1626   11/01/17 1630  vancomycin (VANCOCIN) 1,250 mg in sodium chloride 0.9 % 250 mL IVPB     1,250 mg 166.7 mL/hr over 90 Minutes Intravenous  Once 11/01/17 1626 11/01/17 1834        ENDOCRINE A:   Hypothyroidism P:   Change synthroid to oral  NEUROLOGIC A:   Status epilepticus Sedation need for vent synchrony Multiple strokes seen on MRI brain 05/2017   - neuro signed off. Chronic parieto occipital infarcts left side on MRI On keppra. No seizure as of 11/05/2017 sinc admit  P:   RASS goal: 0 Continue keppra   FAMILY  - Updates: updated son by phone 3/26. None at bedside 11/05/2017. Palliative care consult called for goals of care - sons involved her care given cancer advanced. Meeting 11/05/2017 . Move to med surg and ccm off with TRH primary from 11/06/17 - d/w Dr Aggie Moats    Dr. Brand Males, M.D., [facility].C.P Pulmonary and Critical Care Medicine Staff Physician [facility] Pager: [phone], If no answer or between  15:00h - 7:00h: call [phone]  11/05/2017 10:20 AM

## 2017-11-05 NOTE — Care Management Note (Signed)
Case Management Note  Patient Details  Name: HELYN SCHWAN MRN: [id number] Date of Birth: February 14, 1935  Subjective/Objective:        Pt admitted with AMS            Action/Plan:  PTA from home independent - strong family support.  Palliative consulted for mestastic cancer that has spread to brain.  CM will continue to follow    Expected Discharge Date:                  Expected Discharge Plan:     In-House Referral:  Clinical Social Work  Discharge planning Services  CM Consult  Post Acute Care Choice:    Choice offered to:     DME Arranged:    DME Agency:     HH Arranged:    HH Agency:     Status of Service:     If discussed at [employer], dates discussed:    Additional Comments:  Maryclare Labrador, RN 11/05/2017, 11:33 AM

## 2017-11-05 NOTE — Progress Notes (Signed)
   11/05/17 1100  Clinical Encounter Type  Visited With Patient  Visit Type Initial  Referral From Nurse  Consult/Referral To Chaplain  Spiritual Encounters  Spiritual Needs Prayer;Emotional  Stress Factors  Patient Stress Factors Exhausted  Family Stress Factors None identified    Pt was in her bed awake, alert and watching TV. No family member present and Pt said that they had been on-site and left. Pt asked for prayer and chaplain provided emotional support through reflective listening, compassionate presence and prayer.  Lyris Hitchman a Medical sales representative, [employer]

## 2017-11-05 NOTE — Progress Notes (Signed)
Pharmacy Antibiotic Note  Shawna Schneider is a 83 y.o. female with PMH significant for active metastatic colon cancer admitted on 11/01/2017 with seizures with concern for aspiration pneumonia.  Pharmacy has been consulted for levofloxacin dosing.  This is day 5 of antibiotics for aspiration PNA. Repeat CXR shows stable airspace disease, CT with stable pulmonary metastatic lesions. Patient has had issues with nausea/vomiting, appears to be resolved and can tolerate oral medications. WBC is improving, renal function is above baseline but stable, last QTc is 425. Levaquin is dose-adjusted for CrCl < 30.  Plan: Levofloxacin 500 mg PO q48 hours - plan 7 days total for pneumonia Monitor QTc, renal function, clinical status Pharmacy will continue to follow peripherally, will sign off   Height: 5\' 3"  (160 cm) Weight: 175 lb 4.3 oz (79.5 kg) IBW/kg (Calculated) : 52.4  Temp (24hrs), Avg:98.4 F (36.9 C), Min:98.4 F (36.9 C), Max:98.4 F (36.9 C)  Recent Labs  Lab 11/01/17 1533 11/01/17 1545 11/01/17 2108 11/01/17 2109 11/02/17 0437 11/03/17 0529 11/03/17 2336 11/04/17 0400 11/04/17 2333 11/05/17 0500  WBC 9.4  --   --   --  13.3* 15.2*  --  12.3*  --  6.5  CREATININE 2.01*  --  1.79*  --  1.76* 2.10* 2.09*  --   --  2.10*  LATICACIDVEN  --  11.24*  --  1.8 1.1  --   --   --  0.8  --     Estimated Creatinine Clearance: 21.3 mL/min (A) (by C-G formula based on SCr of 2.1 mg/dL (H)).    Allergies  Allergen Reactions  . Ceftriaxone Swelling    Lip swelling  . Penicillins Other (See Comments)    Has patient had a PCN reaction causing immediate rash, facial/tongue/throat swelling, SOB or lightheadedness with hypotension: unkn Has patient had a PCN reaction causing severe rash involving mucus membranes or skin necrosis: unkn Has patient had a PCN reaction that required hospitalization: unkn Has patient had a PCN reaction occurring within the last 10 years: unkn If all of the above  answers are "NO", then may proceed with Cephalosporin use.     Antimicrobials this admission: LVQ 3/25 >> (3/31)  Flagyl 3/25>>3/26 Vanc 3/25 >> 3/25 Azactam 3/25 >> 3/25 CTX 3/25 >>3/25 *PCN allergy > unknown. Lip swelling w/ CTX > switched abx  Microbiology results: 3/25 BCx - ngtd 3/25 UCx - neg 3/25 MRSA PCR: neg  Thank you for allowing pharmacy to be a part of this patient's care.  Charlene Brooke, PharmD PGY1 Pharmacy Resident Pager: [phone] After 4:00PM please call [place] [phone] 11/05/2017 8:23 AM

## 2017-11-05 NOTE — Progress Notes (Addendum)
Nutrition Follow-up  DOCUMENTATION CODES:   Non-severe (moderate) malnutrition in context of chronic illness  INTERVENTION:    Boost Breeze po BID, each supplement provides 250 kcal and 9 grams of protein.  If difficulty swallowing occurs, recommend consult SLP for swallow evaluation; patient with some dysphagia PTA.  NUTRITION DIAGNOSIS:   Moderate Malnutrition related to chronic illness(colon cancer) as evidenced by mild muscle depletion, moderate muscle depletion, edema, percent weight loss(9.5% weight loss within 3 months).  Ongoing  GOAL:   Patient will meet greater than or equal to 90% of their needs  Unmet  MONITOR:   PO intake, Supplement acceptance, Labs, I & O's, Skin  REASON FOR ASSESSMENT:   Ventilator, Consult Enteral/tube feeding initiation and management  ASSESSMENT:   83 yo female with PMH of renal cell carcinoma, metastatic colon cancer who was admitted on 3/25 with status epilepticus, requiring vent support.   TF has been on hold since 3/27 due to vomiting. Patient was extubated on 3/28. Clear liquid diet starting today, advancing as tolerated to regular diet. Patient reports that she does not tolerate milk. She has tried chocolate Community education officer at home, but she doesn't want them now because they give her diarrhea. She declines Ensure supplements. Encouraged her to try Boost Breeze clear liquid supplement.  Labs and medications reviewed. Palliative Care team following.   Diet Order:  Diet clear liquid Room service appropriate? Yes; Fluid consistency: Thin  EDUCATION NEEDS:   No education needs have been identified at this time  Skin:  Skin Assessment: Skin Integrity Issues: Skin Integrity Issues:: Stage II Stage II: sacrum  Last BM:  unknown  Height:   Ht Readings from Last 1 Encounters:  11/01/17 5\' 3"  (1.6 m)    Weight:   Wt Readings from Last 1 Encounters:  11/05/17 175 lb 4.3 oz (79.5 kg)    Ideal Body Weight:  52.3  kg  BMI:  Body mass index is 31.05 kg/m.  Estimated Nutritional Needs:   Kcal:  1700-1900  Protein:  90-105 gm  Fluid:  1.7-1.9 L    Molli Barrows, RD, LDN, CNSC Pager [phone] After Hours Pager [phone]

## 2017-11-05 NOTE — Progress Notes (Signed)
PHARMACY - PHYSICIAN COMMUNICATION CRITICAL VALUE ALERT - BLOOD CULTURE IDENTIFICATION (BCID)  Shawna Schneider is an 83 y.o. female who presented to [facility] on 11/01/2017 with a chief complaint of seizures with concern for aspiration pneumonia.  Assessment:  This is day 5 of antibiotics for aspiration PNA. Repeat CXR shows stable airspace disease, CT with stable pulmonary metastatic lesions. Patient has had issues with nausea/vomiting, appears to be resolved and can tolerate oral medications. WBC is improving, renal function is above baseline but stable, last QTc is 425. Levaquin is dose-adjusted for CrCl < 30.  Blood Culture 1/2: Gram + Rods, BCID no organisms detected  Name of physician (or Provider) Contacted: n/a  Current antibiotics: Levaquin 500 mg PO q48h - plan for 7 days to end on 4/2  Changes to prescribed antibiotics recommended:  Patient is on recommended antibiotics - No changes needed  Results for orders placed or performed during the hospital encounter of 11/01/17  Blood Culture ID Panel (Reflexed) (Collected: 11/01/2017  4:17 PM)  Result Value Ref Range   Enterococcus species NOT DETECTED NOT DETECTED   Listeria monocytogenes NOT DETECTED NOT DETECTED   Staphylococcus species NOT DETECTED NOT DETECTED   Staphylococcus aureus NOT DETECTED NOT DETECTED   Streptococcus species NOT DETECTED NOT DETECTED   Streptococcus agalactiae NOT DETECTED NOT DETECTED   Streptococcus pneumoniae NOT DETECTED NOT DETECTED   Streptococcus pyogenes NOT DETECTED NOT DETECTED   Acinetobacter baumannii NOT DETECTED NOT DETECTED   Enterobacteriaceae species NOT DETECTED NOT DETECTED   Enterobacter cloacae complex NOT DETECTED NOT DETECTED   Escherichia coli NOT DETECTED NOT DETECTED   Klebsiella oxytoca NOT DETECTED NOT DETECTED   Klebsiella pneumoniae NOT DETECTED NOT DETECTED   Proteus species NOT DETECTED NOT DETECTED   Serratia marcescens NOT DETECTED NOT DETECTED   Haemophilus  influenzae NOT DETECTED NOT DETECTED   Neisseria meningitidis NOT DETECTED NOT DETECTED   Pseudomonas aeruginosa NOT DETECTED NOT DETECTED   Candida albicans NOT DETECTED NOT DETECTED   Candida glabrata NOT DETECTED NOT DETECTED   Candida krusei NOT DETECTED NOT DETECTED   Candida parapsilosis NOT DETECTED NOT DETECTED   Candida tropicalis NOT DETECTED NOT DETECTED    Bridgett Larsson 11/05/2017  8:27 PM

## 2017-11-06 DIAGNOSIS — R4182 Altered mental status, unspecified: Secondary | ICD-10-CM

## 2017-11-06 LAB — BASIC METABOLIC PANEL
ANION GAP: 9 (ref 5–15)
BUN: 16 mg/dL (ref 6–20)
CO2: 17 mmol/L — ABNORMAL LOW (ref 22–32)
Calcium: 9.8 mg/dL (ref 8.9–10.3)
Chloride: 118 mmol/L — ABNORMAL HIGH (ref 101–111)
Creatinine, Ser: 1.83 mg/dL — ABNORMAL HIGH (ref 0.44–1.00)
GFR calc Af Amer: 29 mL/min — ABNORMAL LOW (ref >60)
GFR calc non Af Amer: 25 mL/min — ABNORMAL LOW (ref >60)
GLUCOSE: 104 mg/dL — AB (ref 65–99)
POTASSIUM: 3.5 mmol/L (ref 3.5–5.1)
Sodium: 144 mmol/L (ref 135–145)

## 2017-11-06 LAB — BPAM RBC
Blood Product Expiration Date: 201904062359
ISSUE DATE / TIME: 201903291322
Unit Type and Rh: 9500

## 2017-11-06 LAB — CBC WITH DIFFERENTIAL/PLATELET
BASOS ABS: 0 10*3/uL (ref 0.0–0.1)
BASOS PCT: 0 %
Eosinophils Absolute: 0 10*3/uL (ref 0.0–0.7)
Eosinophils Relative: 1 %
HEMATOCRIT: 25.5 % — AB (ref 36.0–46.0)
HEMOGLOBIN: 8.2 g/dL — AB (ref 12.0–15.0)
Lymphocytes Relative: 15 %
Lymphs Abs: 0.7 10*3/uL (ref 0.7–4.0)
MCH: 27.2 pg (ref 26.0–34.0)
MCHC: 32.2 g/dL (ref 30.0–36.0)
MCV: 84.7 fL (ref 78.0–100.0)
Monocytes Absolute: 0.4 10*3/uL (ref 0.1–1.0)
Monocytes Relative: 8 %
NEUTROS ABS: 3.6 10*3/uL (ref 1.7–7.7)
NEUTROS PCT: 76 %
Platelets: 179 10*3/uL (ref 150–400)
RBC: 3.01 MIL/uL — ABNORMAL LOW (ref 3.87–5.11)
RDW: 19.3 % — ABNORMAL HIGH (ref 11.5–15.5)
WBC: 4.7 10*3/uL (ref 4.0–10.5)

## 2017-11-06 LAB — TYPE AND SCREEN
ABO/RH(D): O POS
ANTIBODY SCREEN: NEGATIVE
UNIT DIVISION: 0

## 2017-11-06 LAB — CULTURE, BLOOD (ROUTINE X 2)
Culture: NO GROWTH
SPECIAL REQUESTS: ADEQUATE

## 2017-11-06 LAB — PHOSPHORUS: PHOSPHORUS: 2.4 mg/dL — AB (ref 2.5–4.6)

## 2017-11-06 LAB — MAGNESIUM: Magnesium: 1.7 mg/dL (ref 1.7–2.4)

## 2017-11-06 NOTE — Progress Notes (Signed)
Shawna Schneider is a 83 y.o. female patient admitted from 81[facility] awake, alert - oriented  X 4. VSS - Blood pressure 125/85, pulse 92, temperature 98.8 F (37.1 C), temperature source Oral, resp. rate 20, height 5\' 3"  (1.6 m), weight 79.5 kg (175 lb 4.3 oz), SpO2 99 %.    IV in place, occlusive dsg intact without redness.   Will cont to eval and treat per MD orders.  Vidal Schwalbe, RN 11/06/2017 1:04 AM

## 2017-11-06 NOTE — Progress Notes (Signed)
Patient Demographics:    Shawna Schneider, is a 83 y.o. female, DOB - 11-03-1934, [place] date - 11/01/2017   Admitting Physician Jesus Genera, MD  Outpatient Primary MD for the patient is [name], MD  LOS - 5   Chief Complaint  Patient presents with  . Altered Mental Status        Subjective:    Shawna Schneider today has no fevers, no emesis,  No chest pain, no further seizures, no new complaints  Assessment  & Plan :    Active Problems:   Metastatic cancer (HCC)   Seizure ([place])   Pressure injury of skin   Malnutrition of moderate degree   Palliative care by specialist   Brief summary- 83 yo female with a history of renal cell carcinoma, now colon cancer who was admitted with status epilepticus   Plan:- 1)Rt LL aspiration pneumonia-clinically improved, continue Levaquin last dose 11/09/2017, continue bronchodilators, extubated 11/05/2017, patient has lung metastases from underlying colon cancer  2)H/o DVT-currently on Lovenox therapeutic doses  3) chronic anemia-no evidence of active bleeding, monitor H&H and transfuse as clinically indicated  4) colon cancer with metastases to the lungs-per oncology  Code Status : Partial   Disposition Plan  : home with [facility]   Consults  :  PCCM   DVT Prophylaxis  :  Lovenox Rx dose  Lab Results  Component Value Date   PLT 179 11/06/2017    Inpatient Medications  Scheduled Meds: . chlorhexidine  15 mL Mouth Rinse BID  . Chlorhexidine Gluconate Cloth  6 each Topical Daily  . enoxaparin (LOVENOX) injection  80 mg Subcutaneous Q24H  . feeding supplement  1 Container Oral BID BM  . levETIRAcetam  500 mg Oral BID  . levofloxacin  500 mg Oral Q48H  . levothyroxine  75 mcg Oral QAC breakfast  . mouth rinse  15 mL Mouth Rinse q12n4p  . sodium chloride flush  10-40 mL Intracatheter Q12H   Continuous Infusions: . sodium  chloride    . dextrose 5% lactated ringers 10 mL/hr at 11/05/17 1036   PRN Meds:.sodium chloride, fentaNYL (SUBLIMAZE) injection, fentaNYL (SUBLIMAZE) injection, ondansetron (ZOFRAN) IV, sodium chloride flush    Anti-infectives (From admission, onward)   Start     Dose/Rate Route Frequency Ordered Stop   11/05/17 1700  levofloxacin (LEVAQUIN) tablet 500 mg     500 mg Oral Every 48 hours 11/05/17 1028 11/09/17 1659   11/03/17 1800  levofloxacin (LEVAQUIN) IVPB 500 mg  Status:  Discontinued     500 mg 100 mL/hr over 60 Minutes Intravenous Every 48 hours 11/01/17 1658 11/01/17 1851   11/03/17 1700  levofloxacin (LEVAQUIN) IVPB 750 mg  Status:  Discontinued     750 mg 100 mL/hr over 90 Minutes Intravenous  Once 11/02/17 0215 11/02/17 0554   11/03/17 1700  levofloxacin (LEVAQUIN) IVPB 500 mg  Status:  Discontinued     500 mg 100 mL/hr over 60 Minutes Intravenous Every 48 hours 11/02/17 0554 11/05/17 1028   11/02/17 1800  vancomycin (VANCOCIN) IVPB 750 mg/150 ml premix  Status:  Discontinued     750 mg 150 mL/hr over 60 Minutes Intravenous Every 24 hours 11/01/17 1658 11/01/17 1851   11/02/17 1000  metroNIDAZOLE (FLAGYL) IVPB 500 mg  Status:  Discontinued     500 mg 100 mL/hr over 60 Minutes Intravenous Every 8 hours 11/02/17 0215 11/02/17 1006   11/02/17 0230  metroNIDAZOLE (FLAGYL) IVPB 500 mg     500 mg 100 mL/hr over 60 Minutes Intravenous  Once 11/02/17 0215 11/02/17 0417   11/02/17 0200  aztreonam (AZACTAM) 1 g in sodium chloride 0.9 % 100 mL IVPB  Status:  Discontinued     1 g 200 mL/hr over 30 Minutes Intravenous Every 8 hours 11/01/17 1658 11/01/17 1851   11/01/17 2200  cefTRIAXone (ROCEPHIN) 1 g in sodium chloride 0.9 % 100 mL IVPB  Status:  Discontinued     1 g 200 mL/hr over 30 Minutes Intravenous Every 24 hours 11/01/17 1904 11/02/17 0204   11/01/17 1630  levofloxacin (LEVAQUIN) IVPB 750 mg     750 mg 100 mL/hr over 90 Minutes Intravenous  Once 11/01/17 1621 11/01/17 1845     11/01/17 1630  aztreonam (AZACTAM) 2 g in sodium chloride 0.9 % 100 mL IVPB     2 g 200 mL/hr over 30 Minutes Intravenous  Once 11/01/17 1621 11/01/17 1743   11/01/17 1630  vancomycin (VANCOCIN) IVPB 1000 mg/200 mL premix  Status:  Discontinued     1,000 mg 200 mL/hr over 60 Minutes Intravenous  Once 11/01/17 1621 11/01/17 1626   11/01/17 1630  vancomycin (VANCOCIN) 1,250 mg in sodium chloride 0.9 % 250 mL IVPB     1,250 mg 166.7 mL/hr over 90 Minutes Intravenous  Once 11/01/17 1626 11/01/17 1834        Objective:   Vitals:   11/05/17 2346 11/06/17 0500 11/06/17 0519 11/06/17 1424  BP: 125/85  120/78 122/82  Pulse: 92  95 95  Resp: 20  19 18   Temp: 98.8 F (37.1 C)  98 F (36.7 C) 99.2 F (37.3 C)  TempSrc: Oral  Oral Oral  SpO2: 99%  97% 98%  Weight:  84.9 kg (187 lb 2.7 oz)    Height:        Wt Readings from Last 3 Encounters:  11/06/17 84.9 kg (187 lb 2.7 oz)  10/07/17 77.7 kg (171 lb 4 oz)  10/06/17 78 kg (172 lb)     Intake/Output Summary (Last 24 hours) at 11/06/2017 1858 Last data filed at 11/06/2017 1528 Gross per 24 hour  Intake 244.67 ml  Output 100 ml  Net 144.67 ml     Physical Exam  Gen:- Awake Alert, in no acute distress  HEENT:- [place].AT, No sclera icterus Neck-Supple Neck,No JVD,.  Lungs-  CTAB diminished breath sounds in bases CV- S1, S2 normal, right subclavian area Port-A-Cath site is clean dry and intact Abd-  +ve B.Sounds, abdominal distention/ascites, epigastric/ventral area hernia    Extremity/Skin:- No  edema,   good pulses Psych-affect is appropriate, oriented x3 Neuro-no new focal deficits, no tremors   Data Review:   Micro Results Recent Results (from the past 240 hour(s))  Blood culture (routine x 2)     Status: None   Collection Time: 11/01/17  3:33 PM  Result Value Ref Range Status   Specimen Description BLOOD RIGHT PORTA CATH  Final   Special Requests IN PEDIATRIC BOTTLE Blood Culture adequate volume  Final   Culture    Final    NO GROWTH 5 DAYS Performed at [facility], [place] [address], [place], [place] [address]    Report Status 11/06/2017 FINAL  Final  Blood culture (routine x  2)     Status: None (Preliminary result)   Collection Time: 11/01/17  4:17 PM  Result Value Ref Range Status   Specimen Description BLOOD LEFT WRIST  Final   Special Requests   Final    BOTTLES DRAWN AEROBIC ONLY Blood Culture adequate volume   Culture  Setup Time   Final    GRAM POSITIVE RODS AEROBIC BOTTLE ONLY CRITICAL RESULT CALLED TO, READ BACK BY AND VERIFIED WITH: DRAYMOND,PHARMD @2024  11/05/17 BY LHOWARD    Culture   Final    GRAM POSITIVE RODS CULTURE REINCUBATED FOR BETTER GROWTH Performed at [facility], [place] [address], [place], [place] [address]    Report Status PENDING  Incomplete  Blood Culture ID Panel (Reflexed)     Status: None   Collection Time: 11/01/17  4:17 PM  Result Value Ref Range Status   Enterococcus species NOT DETECTED NOT DETECTED Final   Listeria monocytogenes NOT DETECTED NOT DETECTED Final   Staphylococcus species NOT DETECTED NOT DETECTED Final   Staphylococcus aureus NOT DETECTED NOT DETECTED Final   Streptococcus species NOT DETECTED NOT DETECTED Final   Streptococcus agalactiae NOT DETECTED NOT DETECTED Final   Streptococcus pneumoniae NOT DETECTED NOT DETECTED Final   Streptococcus pyogenes NOT DETECTED NOT DETECTED Final   Acinetobacter baumannii NOT DETECTED NOT DETECTED Final   Enterobacteriaceae species NOT DETECTED NOT DETECTED Final   Enterobacter cloacae complex NOT DETECTED NOT DETECTED Final   Escherichia coli NOT DETECTED NOT DETECTED Final   Klebsiella oxytoca NOT DETECTED NOT DETECTED Final   Klebsiella pneumoniae NOT DETECTED NOT DETECTED Final   Proteus species NOT DETECTED NOT DETECTED Final   Serratia marcescens NOT DETECTED NOT DETECTED Final   Haemophilus influenzae NOT DETECTED NOT DETECTED Final   Neisseria meningitidis NOT DETECTED NOT DETECTED  Final   Pseudomonas aeruginosa NOT DETECTED NOT DETECTED Final   Candida albicans NOT DETECTED NOT DETECTED Final   Candida glabrata NOT DETECTED NOT DETECTED Final   Candida krusei NOT DETECTED NOT DETECTED Final   Candida parapsilosis NOT DETECTED NOT DETECTED Final   Candida tropicalis NOT DETECTED NOT DETECTED Final    Comment: Performed at [facility], [place] [address], [place], [place] [address]  Urine Culture     Status: None   Collection Time: 11/01/17  5:36 PM  Result Value Ref Range Status   Specimen Description URINE, RANDOM  Final   Special Requests NONE  Final   Culture   Final    NO GROWTH Performed at [facility], [place] [address], [place], [place] [address]    Report Status 11/03/2017 FINAL  Final  MRSA PCR Screening     Status: None   Collection Time: 11/01/17  8:33 PM  Result Value Ref Range Status   MRSA by PCR NEGATIVE NEGATIVE Final    Comment:        The GeneXpert MRSA Assay (FDA approved for NASAL specimens only), is one component of a comprehensive MRSA colonization surveillance program. It is not intended to diagnose MRSA infection nor to guide or monitor treatment for MRSA infections. Performed at [facility], [place] [address] [place],  [address]     Radiology Reports Ct Abdomen Pelvis Wo Contrast  Result Date: 11/04/2017 CLINICAL DATA:  Abdominal distension, nausea and vomiting. History of metastatic colon cancer. EXAM: CT CHEST, ABDOMEN AND PELVIS WITHOUT CONTRAST TECHNIQUE: Multidetector CT imaging of the chest, abdomen and pelvis was performed following the standard protocol without  IV contrast. COMPARISON:  CT scan 09/27/2017 FINDINGS: CT CHEST FINDINGS Cardiovascular: The heart is normal in size. No pericardial effusion. The aorta is normal in caliber. No dissection. Stable coronary artery calcifications. Mediastinum/Nodes: No mediastinal or hilar mass or adenopathy. Small scattered lymph nodes are stable. The endotracheal  tube is in good position above the carina and the NG tube is coursing down the esophagus and into the stomach. Lungs/Pleura: Stable pulmonary metastatic disease when compared to the recent prior CT scan. Index lesion in the left upper lobe measures 17 x 16 mm and previously measured 17 x 17 mm. Index lesion in the right upper lobe near the hilum measures 23 x 20 mm and previous measured 21 x 20 mm. No new pulmonary lesions. There are small bilateral pleural effusions with overlying atelectasis, right greater than left. Musculoskeletal: No breast masses, supraclavicular or axillary adenopathy. The bony structures are unremarkable and stable. CT ABDOMEN PELVIS FINDINGS Hepatobiliary: Stable peritoneal surface lesions involving the liver. No new lesions. Large volume ascites with mass effect on the liver. No intrahepatic biliary dilatation. The gallbladder is distended. No common bile duct dilatation. Pancreas: No mass, inflammation or ductal dilatation. Moderate pancreatic atrophy. Spleen: Small spleen.  No focal lesions. Adrenals/Urinary Tract: Status post left nephrectomy. Stable left adrenal gland adenoma. The right adrenal gland is unremarkable. The right kidney demonstrates multiple hyperdense/hemorrhagic cysts. Stomach/Bowel: The stomach, duodenum, small bowel and colon are grossly normal. No obstructive findings. Vascular/Lymphatic: Extensive and relatively stable appearing omental and peritoneal surface disease in the abdomen and pelvis. No significant change since the study 1 month ago. Stable atherosclerotic calcifications involving the aorta iliac arteries. Reproductive: The uterus and ovaries are not demonstrated. Suspect prior hysterectomy. Other: Stable pelvic tumor and large volume ascites. Diffuse and marked body wall edema. Musculoskeletal: No significant bony findings. IMPRESSION: 1. Large volume abdominal/pelvic ascites and extensive omental and peritoneal disease throughout the abdomen and pelvis  not significantly changed since recent prior study. 2. Stable pulmonary metastatic lesions. 3. Small bilateral pleural effusions and bibasilar atelectasis, right greater than left. Electronically Signed   By: Marijo Sanes M.D.   On: 11/04/2017 11:02   Ct Head Wo Contrast  Result Date: 11/01/2017 CLINICAL DATA:  Focal neuro deficit suspect stroke EXAM: CT HEAD WITHOUT CONTRAST TECHNIQUE: Contiguous axial images were obtained from the base of the skull through the vertex without intravenous contrast. COMPARISON:  CT head 05/26/2015 FINDINGS: Brain: Moderate atrophy. Negative for hydrocephalus. Hypodensity left parietal lobe compatible with chronic infarct which has occurred since the prior study. Mild chronic microvascular ischemic change in the white matter. Negative for acute infarct, hemorrhage, or mass. Vascular: Negative for hyperdense vessel Skull: Negative Sinuses/Orbits: Bilateral cataract removal. Paranasal sinuses clear. Other: None IMPRESSION: Atrophy and chronic ischemia including chronic infarct left parietal lobe. No acute abnormality. Electronically Signed   By: Franchot Gallo M.D.   On: 11/01/2017 16:49   Ct Chest Wo Contrast  Result Date: 11/04/2017 CLINICAL DATA:  Abdominal distension, nausea and vomiting. History of metastatic colon cancer. EXAM: CT CHEST, ABDOMEN AND PELVIS WITHOUT CONTRAST TECHNIQUE: Multidetector CT imaging of the chest, abdomen and pelvis was performed following the standard protocol without IV contrast. COMPARISON:  CT scan 09/27/2017 FINDINGS: CT CHEST FINDINGS Cardiovascular: The heart is normal in size. No pericardial effusion. The aorta is normal in caliber. No dissection. Stable coronary artery calcifications. Mediastinum/Nodes: No mediastinal or hilar mass or adenopathy. Small scattered lymph nodes are stable. The endotracheal tube is in good position above the  carina and the NG tube is coursing down the esophagus and into the stomach. Lungs/Pleura: Stable  pulmonary metastatic disease when compared to the recent prior CT scan. Index lesion in the left upper lobe measures 17 x 16 mm and previously measured 17 x 17 mm. Index lesion in the right upper lobe near the hilum measures 23 x 20 mm and previous measured 21 x 20 mm. No new pulmonary lesions. There are small bilateral pleural effusions with overlying atelectasis, right greater than left. Musculoskeletal: No breast masses, supraclavicular or axillary adenopathy. The bony structures are unremarkable and stable. CT ABDOMEN PELVIS FINDINGS Hepatobiliary: Stable peritoneal surface lesions involving the liver. No new lesions. Large volume ascites with mass effect on the liver. No intrahepatic biliary dilatation. The gallbladder is distended. No common bile duct dilatation. Pancreas: No mass, inflammation or ductal dilatation. Moderate pancreatic atrophy. Spleen: Small spleen.  No focal lesions. Adrenals/Urinary Tract: Status post left nephrectomy. Stable left adrenal gland adenoma. The right adrenal gland is unremarkable. The right kidney demonstrates multiple hyperdense/hemorrhagic cysts. Stomach/Bowel: The stomach, duodenum, small bowel and colon are grossly normal. No obstructive findings. Vascular/Lymphatic: Extensive and relatively stable appearing omental and peritoneal surface disease in the abdomen and pelvis. No significant change since the study 1 month ago. Stable atherosclerotic calcifications involving the aorta iliac arteries. Reproductive: The uterus and ovaries are not demonstrated. Suspect prior hysterectomy. Other: Stable pelvic tumor and large volume ascites. Diffuse and marked body wall edema. Musculoskeletal: No significant bony findings. IMPRESSION: 1. Large volume abdominal/pelvic ascites and extensive omental and peritoneal disease throughout the abdomen and pelvis not significantly changed since recent prior study. 2. Stable pulmonary metastatic lesions. 3. Small bilateral pleural effusions and  bibasilar atelectasis, right greater than left. Electronically Signed   By: Marijo Sanes M.D.   On: 11/04/2017 11:02   Mr Brain Wo Contrast  Result Date: 11/02/2017 CLINICAL DATA:  Seizure. History of renal cell carcinoma, metastatic colon cancer, and stroke. EXAM: MRI HEAD WITHOUT CONTRAST TECHNIQUE: Multiplanar, multiecho pulse sequences of the brain and surrounding structures were obtained without intravenous contrast. COMPARISON:  Head CT 11/01/2017 and MRI 05/24/2017 FINDINGS: Brain: There is no evidence of acute infarct, intracranial mass effect, or extra-axial fluid collection. A chronic left parieto-occipital infarct is noted. There is also a small chronic left temporoparietal infarct along the posterior margin of the sylvian fissure. Small chronic infarcts are present in both cerebellar hemispheres, with acute cerebellar infarcts present on the prior MRI. Scattered small foci of T2 hyperintensity in the cerebral white matter bilaterally are similar to the prior MRI and nonspecific but compatible with mild chronic small vessel ischemic disease. A 3 mm focus of signal abnormality in the posterior subcortical left frontal lobe demonstrates central T2 hyperintensity, a hemosiderin ring, and prominent susceptibility artifact most compatible with a cavernoma. There is also likely an adjacent small developmental venous anomaly. Moderate cerebral atrophy is noted. Dedicated temporal lobe imaging demonstrates symmetric volume of the hippocampi without focal signal abnormality. The pituitary gland is minimally prominent in size for age, measuring 8 mm in height with a minimally convex superior margin. Vascular: Major intracranial vascular flow voids are preserved. Skull and upper cervical spine: Unremarkable bone marrow signal. Sinuses/Orbits: Bilateral cataract extraction. Trace right mastoid fluid. At most minimal bilateral ethmoid air cell mucosal thickening. Other: None. IMPRESSION: 1. No acute intracranial  abnormality. 2. Chronic ischemic changes including chronic left parieto-occipital and cerebellar infarcts. Electronically Signed   By: Logan Bores M.D.   On: 11/02/2017 19:44  Dg Chest Port 1 View  Result Date: 11/04/2017 CLINICAL DATA:  Endotracheal tube EXAM: PORTABLE CHEST 1 VIEW COMPARISON:  11/03/2017 FINDINGS: Endotracheal tube in good position. Gastric tube enters the stomach with the tip not visualized. Port-A-Cath tip in the SVC unchanged Right lower lobe volume loss with elevated right hemidiaphragm and right lower lobe atelectasis/infiltrate unchanged. Mild left lower lobe airspace disease also unchanged. Negative for edema or effusion IMPRESSION: Endotracheal tube remains in good position Bibasilar airspace disease right greater than left unchanged. Electronically Signed   By: Franchot Gallo M.D.   On: 11/04/2017 07:23   Dg Chest Port 1 View  Result Date: 11/03/2017 CLINICAL DATA:  83 y/o  F; ET tube position. EXAM: PORTABLE CHEST 1 VIEW COMPARISON:  11/02/2017 chest radiograph FINDINGS: Low lung volumes accentuate pulmonary markings. Right greater than left basilar opacities are stable. Stable cardiac silhouette. Endotracheal tube tip 2.2 cm above carina. Enteric tube tip below field of view and abdomen. Right port catheter tip projects over lower SVC and is stable. No acute osseous abnormality is evident. IMPRESSION: 1. Endotracheal tube tip projects 2.2 cm above carina, endotracheal tube below field of view and abdomen. 2. Stable low lung volumes and basilar opacities. Electronically Signed   By: Kristine Garbe M.D.   On: 11/03/2017 04:08   Dg Chest Port 1 View  Result Date: 11/02/2017 CLINICAL DATA:  Hypoxia EXAM: PORTABLE CHEST 1 VIEW COMPARISON:  November 01, 2017 FINDINGS: Endotracheal tube tip is 3.1 cm above the carina. Central catheter tip is in superior vena cava. Nasogastric tube tip is below the diaphragm with side port in stomach. No pneumothorax. There is airspace  consolidation in the right lower lobe with small right pleural effusion. There is consolidation in the medial left base as well. Heart size and pulmonary vascularity are normal. No adenopathy. No bone lesions. IMPRESSION: Tube and catheter positions as described without pneumothorax. Consolidation both lung zones, considerably more on the right than on the left. Heart size within normal limits. Electronically Signed   By: Lowella Grip III M.D.   On: 11/02/2017 08:10   Dg Chest Port 1 View  Result Date: 11/01/2017 CLINICAL DATA:  Altered mental status EXAM: PORTABLE CHEST 1 VIEW COMPARISON:  08/11/2017 FINDINGS: Right Port-A-Cath remains in place, unchanged. Endotracheal tube is 3.2 cm above the carina. NG tube is in the stomach. Mild elevation of the right hemidiaphragm. Right lower lobe airspace opacity concerning for consolidation. Rounded masses and nodules noted in both lungs, similar to prior study compatible with metastatic disease. Left base atelectasis or early infiltrate. No visible effusions. IMPRESSION: Bilateral pulmonary nodules and masses compatible with metastases. Or confluent opacity now present in the right lung base concerning for consolidation/pneumonia. Left base atelectasis or early infiltrate. Endotracheal tube 3.2 cm above the carina. Electronically Signed   By: Rolm Baptise M.D.   On: 11/01/2017 16:17     CBC Recent Labs  Lab 11/01/17 1533  11/03/17 0529 11/04/17 0400 11/05/17 0500 11/05/17 1917 11/06/17 0345  WBC 9.4   < > 15.2* 12.3* 6.5 6.2 4.7  HGB 8.7*   < > 7.3* 7.1* 6.8* 8.8* 8.2*  HCT 29.0*   < > 23.8* 22.2* 21.2* 26.7* 25.5*  PLT 427*   < > 239 225 207 204 179  MCV 87.1   < > 85.3 84.7 85.8 85.6 84.7  MCH 26.1   < > 26.2 27.1 27.5 28.2 27.2  MCHC 30.0   < > 30.7 32.0 32.1 33.0 32.2  RDW 20.3*   < > 20.4* 20.5* 21.1* 19.4* 19.3*  LYMPHSABS 3.8  --   --  0.9 0.7  --  0.7  MONOABS 0.1  --   --  0.3 0.2  --  0.4  EOSABS 0.0  --   --  0.0 0.0  --  0.0    BASOSABS 0.0  --   --  0.0 0.0  --  0.0   < > = values in this interval not displayed.    Chemistries  Recent Labs  Lab 11/01/17 1533  11/02/17 0437  11/03/17 0529 11/03/17 1707 11/03/17 2336 11/04/17 0400 11/05/17 0500 11/06/17 0000 11/06/17 0345  NA 140   < > 141  --  142  --  142  --  144 144  --   K 3.5   < > 3.5  --  3.6  --  3.4*  --  3.5 3.5  --   CL 110   < > 115*  --  116*  --  118*  --  118* 118*  --   CO2 9*   < > 16*  --  16*  --  16*  --  18* 17*  --   GLUCOSE 145*   < > 120*  --  92  --  92  --  124* 104*  --   BUN 22*   < > 21*  --  23*  --  23*  --  21* 16  --   CREATININE 2.01*   < > 1.76*  --  2.10*  --  2.09*  --  2.10* 1.83*  --   CALCIUM 9.5   < > 8.4*  --  9.5  --  9.5  --  10.0 9.8  --   MG  --    < > 1.5*   < > 1.8 2.0  --  2.0 1.7  --  1.7  AST 41  --   --   --   --   --   --   --   --   --   --   ALT 13*  --   --   --   --   --   --   --   --   --   --   ALKPHOS 98  --   --   --   --   --   --   --   --   --   --   BILITOT 0.3  --   --   --   --   --   --   --   --   --   --    < > = values in this interval not displayed.   ------------------------------------------------------------------------------------------------------------------ No results for input(s): CHOL, HDL, LDLCALC, TRIG, CHOLHDL, LDLDIRECT in the last 72 hours.  Lab Results  Component Value Date   HGBA1C 6.5 (H) 05/24/2017   ------------------------------------------------------------------------------------------------------------------ No results for input(s): TSH, T4TOTAL, T3FREE, THYROIDAB in the last 72 hours.  Invalid input(s): FREET3 ------------------------------------------------------------------------------------------------------------------ No results for input(s): VITAMINB12, FOLATE, FERRITIN, TIBC, IRON, RETICCTPCT in the last 72 hours.  Coagulation profile Recent Labs  Lab 11/01/17 1533  INR 1.14    No results for input(s): DDIMER in the last 72  hours.  Cardiac Enzymes No results for input(s): CKMB, TROPONINI, MYOGLOBIN in the last 168 hours.  Invalid input(s): CK ------------------------------------------------------------------------------------------------------------------ No results found for: BNP   Roxan Hockey M.D on 11/06/2017 at 6:58  PM  Between 7am to 7pm - Pager - [phone]  After 7pm go to [email or web address] - password [email or web address]  [facility] -  Office  [phone]   Voice Recognition Viviann Spare dictation system was used to create this note, attempts have been made to correct errors. Please contact the author with questions and/or clarifications.

## 2017-11-07 ENCOUNTER — Other Ambulatory Visit: Payer: Self-pay | Admitting: Oncology

## 2017-11-07 LAB — PHOSPHORUS: Phosphorus: 2.2 mg/dL — ABNORMAL LOW (ref 2.5–4.6)

## 2017-11-07 LAB — CBC WITH DIFFERENTIAL/PLATELET
Basophils Absolute: 0 10*3/uL (ref 0.0–0.1)
Basophils Relative: 0 %
EOS PCT: 1 %
Eosinophils Absolute: 0 10*3/uL (ref 0.0–0.7)
HEMATOCRIT: 26.3 % — AB (ref 36.0–46.0)
Hemoglobin: 8.4 g/dL — ABNORMAL LOW (ref 12.0–15.0)
LYMPHS ABS: 1.1 10*3/uL (ref 0.7–4.0)
LYMPHS PCT: 24 %
MCH: 27.5 pg (ref 26.0–34.0)
MCHC: 31.9 g/dL (ref 30.0–36.0)
MCV: 85.9 fL (ref 78.0–100.0)
MONO ABS: 0.4 10*3/uL (ref 0.1–1.0)
MONOS PCT: 8 %
NEUTROS ABS: 3.2 10*3/uL (ref 1.7–7.7)
Neutrophils Relative %: 67 %
PLATELETS: 178 10*3/uL (ref 150–400)
RBC: 3.06 MIL/uL — ABNORMAL LOW (ref 3.87–5.11)
RDW: 19.7 % — AB (ref 11.5–15.5)
WBC: 4.8 10*3/uL (ref 4.0–10.5)

## 2017-11-07 LAB — MAGNESIUM: Magnesium: 1.6 mg/dL — ABNORMAL LOW (ref 1.7–2.4)

## 2017-11-07 NOTE — Progress Notes (Signed)
Patient Demographics:    Shawna Schneider, is a 83 y.o. female, DOB - 03/14/1935, NIO:[id number]  Admit date - 11/01/2017   Admitting Physician Jesus Genera, MD  Outpatient Primary MD for the patient is Shawna Pretty, MD  LOS - 6   Chief Complaint  Patient presents with  . Altered Mental Status        Subjective:    Shawna Schneider today has no fevers, no emesis,  No chest pain, no further seizures, no new complaints, ???  If she can have paracentesis  Assessment  & Plan :    Active Problems:   Metastatic cancer (HCC)   Seizure ([place])   Pressure injury of skin   Malnutrition of moderate degree   Palliative care by specialist   Brief summary- 83 yo female with a history of renal cell carcinoma, now colon cancer who was admitted with status epilepticus   Plan:- 1)Rt LL aspiration pneumonia-clinically improving, continue Levaquin last dose 11/09/2017, continue bronchodilators, extubated 11/05/2017, patient has lung metastases from underlying colon cancer, no fevers  2)H/o DVT-currently on Lovenox therapeutic doses  3) chronic anemia-no evidence of active bleeding, monitor H&H and transfuse as clinically indicated  4) recurrent ascites-possible pressure transfuse prior to discharge  5) colon cancer with metastases to the lungs-per oncology.. Dr. Learta Codding   Code Status : Partial   Disposition Plan  : home with [facility]   Consults  :  PCCM   DVT Prophylaxis  :  Lovenox Rx dose  Lab Results  Component Value Date   PLT 178 11/07/2017    Inpatient Medications  Scheduled Meds: . chlorhexidine  15 mL Mouth Rinse BID  . Chlorhexidine Gluconate Cloth  6 each Topical Daily  . enoxaparin (LOVENOX) injection  80 mg Subcutaneous Q24H  . feeding supplement  1 Container Oral BID BM  . levETIRAcetam  500 mg Oral BID  . levothyroxine  75 mcg Oral QAC breakfast  . mouth rinse  15 mL Mouth Rinse  [id number]  . sodium chloride flush  10-40 mL Intracatheter Q12H   Continuous Infusions: . sodium chloride    . dextrose 5% lactated ringers 10 mL/hr at 11/05/17 1036   PRN Meds:.sodium chloride, fentaNYL (SUBLIMAZE) injection, fentaNYL (SUBLIMAZE) injection, ondansetron (ZOFRAN) IV, sodium chloride flush    Anti-infectives (From admission, onward)   Start     Dose/Rate Route Frequency Ordered Stop   11/05/17 1700  levofloxacin (LEVAQUIN) tablet 500 mg     500 mg Oral Every 48 hours 11/05/17 1028 11/07/17 1642   11/03/17 1800  levofloxacin (LEVAQUIN) IVPB 500 mg  Status:  Discontinued     500 mg 100 mL/hr over 60 Minutes Intravenous Every 48 hours 11/01/17 1658 11/01/17 1851   11/03/17 1700  levofloxacin (LEVAQUIN) IVPB 750 mg  Status:  Discontinued     750 mg 100 mL/hr over 90 Minutes Intravenous  Once 11/02/17 0215 11/02/17 0554   11/03/17 1700  levofloxacin (LEVAQUIN) IVPB 500 mg  Status:  Discontinued     500 mg 100 mL/hr over 60 Minutes Intravenous Every 48 hours 11/02/17 0554 11/05/17 1028   11/02/17 1800  vancomycin (VANCOCIN) IVPB 750 mg/150 ml premix  Status:  Discontinued     750 mg 150 mL/hr over 60 Minutes  Intravenous Every 24 hours 11/01/17 1658 11/01/17 1851   11/02/17 1000  metroNIDAZOLE (FLAGYL) IVPB 500 mg  Status:  Discontinued     500 mg 100 mL/hr over 60 Minutes Intravenous Every 8 hours 11/02/17 0215 11/02/17 1006   11/02/17 0230  metroNIDAZOLE (FLAGYL) IVPB 500 mg     500 mg 100 mL/hr over 60 Minutes Intravenous  Once 11/02/17 0215 11/02/17 0417   11/02/17 0200  aztreonam (AZACTAM) 1 g in sodium chloride 0.9 % 100 mL IVPB  Status:  Discontinued     1 g 200 mL/hr over 30 Minutes Intravenous Every 8 hours 11/01/17 1658 11/01/17 1851   11/01/17 2200  cefTRIAXone (ROCEPHIN) 1 g in sodium chloride 0.9 % 100 mL IVPB  Status:  Discontinued     1 g 200 mL/hr over 30 Minutes Intravenous Every 24 hours 11/01/17 1904 11/02/17 0204   11/01/17 1630  levofloxacin (LEVAQUIN)  IVPB 750 mg     750 mg 100 mL/hr over 90 Minutes Intravenous  Once 11/01/17 1621 11/01/17 1845   11/01/17 1630  aztreonam (AZACTAM) 2 g in sodium chloride 0.9 % 100 mL IVPB     2 g 200 mL/hr over 30 Minutes Intravenous  Once 11/01/17 1621 11/01/17 1743   11/01/17 1630  vancomycin (VANCOCIN) IVPB 1000 mg/200 mL premix  Status:  Discontinued     1,000 mg 200 mL/hr over 60 Minutes Intravenous  Once 11/01/17 1621 11/01/17 1626   11/01/17 1630  vancomycin (VANCOCIN) 1,250 mg in sodium chloride 0.9 % 250 mL IVPB     1,250 mg 166.7 mL/hr over 90 Minutes Intravenous  Once 11/01/17 1626 11/01/17 1834        Objective:   Vitals:   11/06/17 1424 11/06/17 2107 11/07/17 0300 11/07/17 1455  BP: 122/82 119/76 106/74 107/63  Pulse: 95 92 (!) 112 98  Resp: 18 17 18    Temp: 99.2 F (37.3 C) 99.1 F (37.3 C) 98.5 F (36.9 C) 99 F (37.2 C)  TempSrc: Oral Oral Oral Oral  SpO2: 98% 96% 98% 99%  Weight:   83.8 kg (184 lb 11.9 oz)   Height:        Wt Readings from Last 3 Encounters:  11/07/17 83.8 kg (184 lb 11.9 oz)  10/07/17 77.7 kg (171 lb 4 oz)  10/06/17 78 kg (172 lb)     Intake/Output Summary (Last 24 hours) at 11/07/2017 1825 Last data filed at 11/07/2017 1455 Gross per 24 hour  Intake 237 ml  Output -  Net 237 ml     Physical Exam  Gen:- Awake Alert, in no acute distress  HEENT:- [place].AT, No sclera icterus Neck-Supple Neck,No JVD,.  Lungs-  CTAB diminished breath sounds in bases CV- S1, S2 normal, right subclavian area Port-A-Cath site is clean dry and intact Abd-  +ve B.Sounds, abdominal distention/ascites, epigastric/ventral area hernia    Extremity/Skin:- No  edema,   good pulses Psych-affect is appropriate, oriented x3 Neuro-no new focal deficits, no tremors   Data Review:   Micro Results Recent Results (from the past 240 hour(s))  Blood culture (routine x 2)     Status: None   Collection Time: 11/01/17  3:33 PM  Result Value Ref Range Status   Specimen  Description BLOOD RIGHT PORTA CATH  Final   Special Requests IN PEDIATRIC BOTTLE Blood Culture adequate volume  Final   Culture   Final    NO GROWTH 5 DAYS Performed at [facility], [place] [address], [place], [place]  [address]    Report Status 11/06/2017 FINAL  Final  Blood culture (routine x 2)     Status: None (Preliminary result)   Collection Time: 11/01/17  4:17 PM  Result Value Ref Range Status   Specimen Description BLOOD LEFT WRIST  Final   Special Requests   Final    BOTTLES DRAWN AEROBIC ONLY Blood Culture adequate volume   Culture  Setup Time   Final    GRAM POSITIVE RODS AEROBIC BOTTLE ONLY CRITICAL RESULT CALLED TO, READ BACK BY AND VERIFIED WITH: DRAYMOND,PHARMD @2024  11/05/17 BY LHOWARD    Culture   Final    GRAM POSITIVE RODS IDENTIFICATION TO FOLLOW Performed at [facility], [place] [address], [place], [place] [address]    Report Status PENDING  Incomplete  Blood Culture ID Panel (Reflexed)     Status: None   Collection Time: 11/01/17  4:17 PM  Result Value Ref Range Status   Enterococcus species NOT DETECTED NOT DETECTED Final   Listeria monocytogenes NOT DETECTED NOT DETECTED Final   Staphylococcus species NOT DETECTED NOT DETECTED Final   Staphylococcus aureus NOT DETECTED NOT DETECTED Final   Streptococcus species NOT DETECTED NOT DETECTED Final   Streptococcus agalactiae NOT DETECTED NOT DETECTED Final   Streptococcus pneumoniae NOT DETECTED NOT DETECTED Final   Streptococcus pyogenes NOT DETECTED NOT DETECTED Final   Acinetobacter baumannii NOT DETECTED NOT DETECTED Final   Enterobacteriaceae species NOT DETECTED NOT DETECTED Final   Enterobacter cloacae complex NOT DETECTED NOT DETECTED Final   Escherichia coli NOT DETECTED NOT DETECTED Final   Klebsiella oxytoca NOT DETECTED NOT DETECTED Final   Klebsiella pneumoniae NOT DETECTED NOT DETECTED Final   Proteus species NOT DETECTED NOT DETECTED Final   Serratia marcescens NOT DETECTED NOT  DETECTED Final   Haemophilus influenzae NOT DETECTED NOT DETECTED Final   Neisseria meningitidis NOT DETECTED NOT DETECTED Final   Pseudomonas aeruginosa NOT DETECTED NOT DETECTED Final   Candida albicans NOT DETECTED NOT DETECTED Final   Candida glabrata NOT DETECTED NOT DETECTED Final   Candida krusei NOT DETECTED NOT DETECTED Final   Candida parapsilosis NOT DETECTED NOT DETECTED Final   Candida tropicalis NOT DETECTED NOT DETECTED Final    Comment: Performed at [facility], [place] [address] [place], [place] [address]  Urine Culture     Status: None   Collection Time: 11/01/17  5:36 PM  Result Value Ref Range Status   Specimen Description URINE, RANDOM  Final   Special Requests NONE  Final   Culture   Final    NO GROWTH Performed at [facility], [place] [address] [place], [place] [address]    Report Status 11/03/2017 FINAL  Final  MRSA PCR Screening     Status: None   Collection Time: 11/01/17  8:33 PM  Result Value Ref Range Status   MRSA by PCR NEGATIVE NEGATIVE Final    Comment:        The GeneXpert MRSA Assay (FDA approved for NASAL specimens only), is one component of a comprehensive MRSA colonization surveillance program. It is not intended to diagnose MRSA infection nor to guide or monitor treatment for MRSA infections. Performed at [facility], [place] [address] [place],  [address]     Radiology Reports Ct Abdomen Pelvis Wo Contrast  Result Date: 11/04/2017 CLINICAL DATA:  Abdominal distension, nausea and vomiting. History of metastatic colon cancer. EXAM: CT CHEST, ABDOMEN AND PELVIS WITHOUT CONTRAST TECHNIQUE: Multidetector CT imaging  of the chest, abdomen and pelvis was performed following the standard protocol without IV contrast. COMPARISON:  CT scan 09/27/2017 FINDINGS: CT CHEST FINDINGS Cardiovascular: The heart is normal in size. No pericardial effusion. The aorta is normal in caliber. No dissection. Stable coronary artery  calcifications. Mediastinum/Nodes: No mediastinal or hilar mass or adenopathy. Small scattered lymph nodes are stable. The endotracheal tube is in good position above the carina and the NG tube is coursing down the esophagus and into the stomach. Lungs/Pleura: Stable pulmonary metastatic disease when compared to the recent prior CT scan. Index lesion in the left upper lobe measures 17 x 16 mm and previously measured 17 x 17 mm. Index lesion in the right upper lobe near the hilum measures 23 x 20 mm and previous measured 21 x 20 mm. No new pulmonary lesions. There are small bilateral pleural effusions with overlying atelectasis, right greater than left. Musculoskeletal: No breast masses, supraclavicular or axillary adenopathy. The bony structures are unremarkable and stable. CT ABDOMEN PELVIS FINDINGS Hepatobiliary: Stable peritoneal surface lesions involving the liver. No new lesions. Large volume ascites with mass effect on the liver. No intrahepatic biliary dilatation. The gallbladder is distended. No common bile duct dilatation. Pancreas: No mass, inflammation or ductal dilatation. Moderate pancreatic atrophy. Spleen: Small spleen.  No focal lesions. Adrenals/Urinary Tract: Status post left nephrectomy. Stable left adrenal gland adenoma. The right adrenal gland is unremarkable. The right kidney demonstrates multiple hyperdense/hemorrhagic cysts. Stomach/Bowel: The stomach, duodenum, small bowel and colon are grossly normal. No obstructive findings. Vascular/Lymphatic: Extensive and relatively stable appearing omental and peritoneal surface disease in the abdomen and pelvis. No significant change since the study 1 month ago. Stable atherosclerotic calcifications involving the aorta iliac arteries. Reproductive: The uterus and ovaries are not demonstrated. Suspect prior hysterectomy. Other: Stable pelvic tumor and large volume ascites. Diffuse and marked body wall edema. Musculoskeletal: No significant bony  findings. IMPRESSION: 1. Large volume abdominal/pelvic ascites and extensive omental and peritoneal disease throughout the abdomen and pelvis not significantly changed since recent prior study. 2. Stable pulmonary metastatic lesions. 3. Small bilateral pleural effusions and bibasilar atelectasis, right greater than left. Electronically Signed   By: Marijo Sanes M.D.   On: 11/04/2017 11:02   Ct Head Wo Contrast  Result Date: 11/01/2017 CLINICAL DATA:  Focal neuro deficit suspect stroke EXAM: CT HEAD WITHOUT CONTRAST TECHNIQUE: Contiguous axial images were obtained from the base of the skull through the vertex without intravenous contrast. COMPARISON:  CT head 05/26/2015 FINDINGS: Brain: Moderate atrophy. Negative for hydrocephalus. Hypodensity left parietal lobe compatible with chronic infarct which has occurred since the prior study. Mild chronic microvascular ischemic change in the white matter. Negative for acute infarct, hemorrhage, or mass. Vascular: Negative for hyperdense vessel Skull: Negative Sinuses/Orbits: Bilateral cataract removal. Paranasal sinuses clear. Other: None IMPRESSION: Atrophy and chronic ischemia including chronic infarct left parietal lobe. No acute abnormality. Electronically Signed   By: Franchot Gallo M.D.   On: 11/01/2017 16:49   Ct Chest Wo Contrast  Result Date: 11/04/2017 CLINICAL DATA:  Abdominal distension, nausea and vomiting. History of metastatic colon cancer. EXAM: CT CHEST, ABDOMEN AND PELVIS WITHOUT CONTRAST TECHNIQUE: Multidetector CT imaging of the chest, abdomen and pelvis was performed following the standard protocol without IV contrast. COMPARISON:  CT scan 09/27/2017 FINDINGS: CT CHEST FINDINGS Cardiovascular: The heart is normal in size. No pericardial effusion. The aorta is normal in caliber. No dissection. Stable coronary artery calcifications. Mediastinum/Nodes: No mediastinal or hilar mass or adenopathy. Small scattered  lymph nodes are stable. The  endotracheal tube is in good position above the carina and the NG tube is coursing down the esophagus and into the stomach. Lungs/Pleura: Stable pulmonary metastatic disease when compared to the recent prior CT scan. Index lesion in the left upper lobe measures 17 x 16 mm and previously measured 17 x 17 mm. Index lesion in the right upper lobe near the hilum measures 23 x 20 mm and previous measured 21 x 20 mm. No new pulmonary lesions. There are small bilateral pleural effusions with overlying atelectasis, right greater than left. Musculoskeletal: No breast masses, supraclavicular or axillary adenopathy. The bony structures are unremarkable and stable. CT ABDOMEN PELVIS FINDINGS Hepatobiliary: Stable peritoneal surface lesions involving the liver. No new lesions. Large volume ascites with mass effect on the liver. No intrahepatic biliary dilatation. The gallbladder is distended. No common bile duct dilatation. Pancreas: No mass, inflammation or ductal dilatation. Moderate pancreatic atrophy. Spleen: Small spleen.  No focal lesions. Adrenals/Urinary Tract: Status post left nephrectomy. Stable left adrenal gland adenoma. The right adrenal gland is unremarkable. The right kidney demonstrates multiple hyperdense/hemorrhagic cysts. Stomach/Bowel: The stomach, duodenum, small bowel and colon are grossly normal. No obstructive findings. Vascular/Lymphatic: Extensive and relatively stable appearing omental and peritoneal surface disease in the abdomen and pelvis. No significant change since the study 1 month ago. Stable atherosclerotic calcifications involving the aorta iliac arteries. Reproductive: The uterus and ovaries are not demonstrated. Suspect prior hysterectomy. Other: Stable pelvic tumor and large volume ascites. Diffuse and marked body wall edema. Musculoskeletal: No significant bony findings. IMPRESSION: 1. Large volume abdominal/pelvic ascites and extensive omental and peritoneal disease throughout the abdomen  and pelvis not significantly changed since recent prior study. 2. Stable pulmonary metastatic lesions. 3. Small bilateral pleural effusions and bibasilar atelectasis, right greater than left. Electronically Signed   By: Marijo Sanes M.D.   On: 11/04/2017 11:02   Mr Brain Wo Contrast  Result Date: 11/02/2017 CLINICAL DATA:  Seizure. History of renal cell carcinoma, metastatic colon cancer, and stroke. EXAM: MRI HEAD WITHOUT CONTRAST TECHNIQUE: Multiplanar, multiecho pulse sequences of the brain and surrounding structures were obtained without intravenous contrast. COMPARISON:  Head CT 11/01/2017 and MRI 05/24/2017 FINDINGS: Brain: There is no evidence of acute infarct, intracranial mass effect, or extra-axial fluid collection. A chronic left parieto-occipital infarct is noted. There is also a small chronic left temporoparietal infarct along the posterior margin of the sylvian fissure. Small chronic infarcts are present in both cerebellar hemispheres, with acute cerebellar infarcts present on the prior MRI. Scattered small foci of T2 hyperintensity in the cerebral white matter bilaterally are similar to the prior MRI and nonspecific but compatible with mild chronic small vessel ischemic disease. A 3 mm focus of signal abnormality in the posterior subcortical left frontal lobe demonstrates central T2 hyperintensity, a hemosiderin ring, and prominent susceptibility artifact most compatible with a cavernoma. There is also likely an adjacent small developmental venous anomaly. Moderate cerebral atrophy is noted. Dedicated temporal lobe imaging demonstrates symmetric volume of the hippocampi without focal signal abnormality. The pituitary gland is minimally prominent in size for age, measuring 8 mm in height with a minimally convex superior margin. Vascular: Major intracranial vascular flow voids are preserved. Skull and upper cervical spine: Unremarkable bone marrow signal. Sinuses/Orbits: Bilateral cataract  extraction. Trace right mastoid fluid. At most minimal bilateral ethmoid air cell mucosal thickening. Other: None. IMPRESSION: 1. No acute intracranial abnormality. 2. Chronic ischemic changes including chronic left parieto-occipital and cerebellar infarcts. Electronically  Signed   By: Logan Bores M.D.   On: 11/02/2017 19:44   Dg Chest Port 1 View  Result Date: 11/04/2017 CLINICAL DATA:  Endotracheal tube EXAM: PORTABLE CHEST 1 VIEW COMPARISON:  11/03/2017 FINDINGS: Endotracheal tube in good position. Gastric tube enters the stomach with the tip not visualized. Port-A-Cath tip in the SVC unchanged Right lower lobe volume loss with elevated right hemidiaphragm and right lower lobe atelectasis/infiltrate unchanged. Mild left lower lobe airspace disease also unchanged. Negative for edema or effusion IMPRESSION: Endotracheal tube remains in good position Bibasilar airspace disease right greater than left unchanged. Electronically Signed   By: Franchot Gallo M.D.   On: 11/04/2017 07:23   Dg Chest Port 1 View  Result Date: 11/03/2017 CLINICAL DATA:  83 y/o  F; ET tube position. EXAM: PORTABLE CHEST 1 VIEW COMPARISON:  11/02/2017 chest radiograph FINDINGS: Low lung volumes accentuate pulmonary markings. Right greater than left basilar opacities are stable. Stable cardiac silhouette. Endotracheal tube tip 2.2 cm above carina. Enteric tube tip below field of view and abdomen. Right port catheter tip projects over lower SVC and is stable. No acute osseous abnormality is evident. IMPRESSION: 1. Endotracheal tube tip projects 2.2 cm above carina, endotracheal tube below field of view and abdomen. 2. Stable low lung volumes and basilar opacities. Electronically Signed   By: Kristine Garbe M.D.   On: 11/03/2017 04:08   Dg Chest Port 1 View  Result Date: 11/02/2017 CLINICAL DATA:  Hypoxia EXAM: PORTABLE CHEST 1 VIEW COMPARISON:  November 01, 2017 FINDINGS: Endotracheal tube tip is 3.1 cm above the carina.  Central catheter tip is in superior vena cava. Nasogastric tube tip is below the diaphragm with side port in stomach. No pneumothorax. There is airspace consolidation in the right lower lobe with small right pleural effusion. There is consolidation in the medial left base as well. Heart size and pulmonary vascularity are normal. No adenopathy. No bone lesions. IMPRESSION: Tube and catheter positions as described without pneumothorax. Consolidation both lung zones, considerably more on the right than on the left. Heart size within normal limits. Electronically Signed   By: Lowella Grip III M.D.   On: 11/02/2017 08:10   Dg Chest Port 1 View  Result Date: 11/01/2017 CLINICAL DATA:  Altered mental status EXAM: PORTABLE CHEST 1 VIEW COMPARISON:  08/11/2017 FINDINGS: Right Port-A-Cath remains in place, unchanged. Endotracheal tube is 3.2 cm above the carina. NG tube is in the stomach. Mild elevation of the right hemidiaphragm. Right lower lobe airspace opacity concerning for consolidation. Rounded masses and nodules noted in both lungs, similar to prior study compatible with metastatic disease. Left base atelectasis or early infiltrate. No visible effusions. IMPRESSION: Bilateral pulmonary nodules and masses compatible with metastases. Or confluent opacity now present in the right lung base concerning for consolidation/pneumonia. Left base atelectasis or early infiltrate. Endotracheal tube 3.2 cm above the carina. Electronically Signed   By: Rolm Baptise M.D.   On: 11/01/2017 16:17     CBC Recent Labs  Lab 11/01/17 1533  11/04/17 0400 11/05/17 0500 11/05/17 1917 11/06/17 0345 11/07/17 0352  WBC 9.4   < > 12.3* 6.5 6.2 4.7 4.8  HGB 8.7*   < > 7.1* 6.8* 8.8* 8.2* 8.4*  HCT 29.0*   < > 22.2* 21.2* 26.7* 25.5* 26.3*  PLT 427*   < > 225 207 204 179 178  MCV 87.1   < > 84.7 85.8 85.6 84.7 85.9  MCH 26.1   < > 27.1 27.5 28.2  27.2 27.5  MCHC 30.0   < > 32.0 32.1 33.0 32.2 31.9  RDW 20.3*   < > 20.5*  21.1* 19.4* 19.3* 19.7*  LYMPHSABS 3.8  --  0.9 0.7  --  0.7 1.1  MONOABS 0.1  --  0.3 0.2  --  0.4 0.4  EOSABS 0.0  --  0.0 0.0  --  0.0 0.0  BASOSABS 0.0  --  0.0 0.0  --  0.0 0.0   < > = values in this interval not displayed.    Chemistries  Recent Labs  Lab 11/01/17 1533  11/02/17 0437  11/03/17 0529 11/03/17 1707 11/03/17 2336 11/04/17 0400 11/05/17 0500 11/06/17 0000 11/06/17 0345 11/07/17 0352  NA 140   < > 141  --  142  --  142  --  144 144  --   --   K 3.5   < > 3.5  --  3.6  --  3.4*  --  3.5 3.5  --   --   CL 110   < > 115*  --  116*  --  118*  --  118* 118*  --   --   CO2 9*   < > 16*  --  16*  --  16*  --  18* 17*  --   --   GLUCOSE 145*   < > 120*  --  92  --  92  --  124* 104*  --   --   BUN 22*   < > 21*  --  23*  --  23*  --  21* 16  --   --   CREATININE 2.01*   < > 1.76*  --  2.10*  --  2.09*  --  2.10* 1.83*  --   --   CALCIUM 9.5   < > 8.4*  --  9.5  --  9.5  --  10.0 9.8  --   --   MG  --    < > 1.5*   < > 1.8 2.0  --  2.0 1.7  --  1.7 1.6*  AST 41  --   --   --   --   --   --   --   --   --   --   --   ALT 13*  --   --   --   --   --   --   --   --   --   --   --   ALKPHOS 98  --   --   --   --   --   --   --   --   --   --   --   BILITOT 0.3  --   --   --   --   --   --   --   --   --   --   --    < > = values in this interval not displayed.   ------------------------------------------------------------------------------------------------------------------ No results for input(s): CHOL, HDL, LDLCALC, TRIG, CHOLHDL, LDLDIRECT in the last 72 hours.  Lab Results  Component Value Date   HGBA1C 6.5 (H) 05/24/2017   ------------------------------------------------------------------------------------------------------------------ No results for input(s): TSH, T4TOTAL, T3FREE, THYROIDAB in the last 72 hours.  Invalid input(s): FREET3 ------------------------------------------------------------------------------------------------------------------ No  results for input(s): VITAMINB12, FOLATE, FERRITIN, TIBC, IRON, RETICCTPCT in the last 72 hours.  Coagulation profile Recent Labs  Lab 11/01/17 1533  INR 1.14  No results for input(s): DDIMER in the last 72 hours.  Cardiac Enzymes No results for input(s): CKMB, TROPONINI, MYOGLOBIN in the last 168 hours.  Invalid input(s): CK ------------------------------------------------------------------------------------------------------------------ No results found for: BNP   Roxan Hockey M.D on 11/07/2017 at 6:25 PM  Between 7am to 7pm - Pager - [phone]  After 7pm go to [email or web address] - password [email or web address]  [facility] -  Office  [phone]   Voice Recognition Viviann Spare dictation system was used to create this note, attempts have been made to correct errors. Please contact the author with questions and/or clarifications.

## 2017-11-08 ENCOUNTER — Inpatient Hospital Stay (HOSPITAL_COMMUNITY): Payer: BC Managed Care – PPO

## 2017-11-08 DIAGNOSIS — R188 Other ascites: Secondary | ICD-10-CM

## 2017-11-08 LAB — CULTURE, BLOOD (ROUTINE X 2): Special Requests: ADEQUATE

## 2017-11-08 LAB — PHOSPHORUS: Phosphorus: 2.2 mg/dL — ABNORMAL LOW (ref 2.5–4.6)

## 2017-11-08 MED ORDER — LIDOCAINE HCL (PF) 2 % IJ SOLN
INTRAMUSCULAR | Status: AC
  Filled 2017-11-08: qty 20

## 2017-11-08 MED ORDER — ONDANSETRON 4 MG PO TBDP: 4.0000 mg | ORAL_TABLET | Freq: Three times a day (TID) | ORAL | 0 refills | Status: DC | PRN

## 2017-11-08 MED ORDER — LEVETIRACETAM 500 MG PO TABS: 500.0000 mg | ORAL_TABLET | Freq: Two times a day (BID) | ORAL | 1 refills | Status: DC

## 2017-11-08 MED ORDER — ONDANSETRON 4 MG PO TBDP: 8.0000 mg | ORAL_TABLET | Freq: Three times a day (TID) | ORAL | 0 refills | Status: DC | PRN

## 2017-11-08 MED ORDER — HEPARIN SOD (PORK) LOCK FLUSH 100 UNIT/ML IV SOLN
500.0000 [IU] | INTRAVENOUS | Status: AC | PRN
  Administered 2017-11-08: 500 [IU]

## 2017-11-08 MED ORDER — HYDROCODONE-ACETAMINOPHEN 5-325 MG PO TABS: 1.0000 | ORAL_TABLET | Freq: Four times a day (QID) | ORAL | 0 refills | Status: DC | PRN

## 2017-11-08 NOTE — Progress Notes (Signed)
Patient brought to radiology department for possible paracentesis.  Patient is known to IR from recent paracentesis which yielded 3 mL of thick, viscous fluid.   Discussed procedure with patient, Dr. Denton Brick, and Dr. Kathlene Cote.   Reviewed CT scan which shows advanced disease, omental caking, and no change since last paracentesis in appearance of fluid.  No paracentesis performed today.   Patient aware.   Brynda Greathouse, MS RD PA-C 1:48 PM

## 2017-11-08 NOTE — Progress Notes (Signed)
Discharge home. Home discharge instruction given to Son. HHneeds addressed by the CM. No further questions.

## 2017-11-08 NOTE — Care Management Note (Addendum)
Case Management Note  Patient Details  Name: Shawna Schneider MRN: [id number] Date of Birth: October 16, 1934  Subjective/Objective:                    Action/Plan:  Spoke with patient and son [phone] at bedside. Plan is patient will discharge to home with HHRN,PT, and aide.   They would like [facility]. Patient and son want to know how many visits, frequency and duration. Explained to MD and son that once home health orders and face to face entered [place] will be able to tell patient and son answer to those questions.   Dan with [facility] aware and once he receives orders he will run insurance and talk to patient/ son .  [facility] unable to provide staff, declined referral. [facility] second choice [facility] Referral given to [facility] at same. Hoyle Sauer and [place] talking directly   Expected Discharge Date:                  Expected Discharge Plan:  [place]  In-House Referral:     Discharge planning Services  CM Consult  Post Acute Care Choice:  Durable Medical Equipment Choice offered to:  Patient, Adult Children  DME Arranged:  3-N-1, [place] rolling DME Agency:  [place]:  RN, PT, Nurse's Aide [place] Agency:  [place]  Status of Service:  In process, will continue to follow  If discussed at Long Length of Stay Meetings, dates discussed:    Additional Comments:  Marilu Favre, RN 11/08/2017, 11:43 AM

## 2017-11-08 NOTE — Discharge Summary (Signed)
Shawna Schneider, is a 83 y.o. female  DOB 1934/10/10  MRN [id number].  Admission date:  11/01/2017  Admitting Physician  Jesus Genera, MD  Discharge Date:  11/08/2017   Primary MD  [name], MD  Recommendations for primary care physician for things to follow:   1)Take medications as prescribed 2)Follow-up with your oncologist Dr. Learta Codding as scheduled on 11/10/2017 3)Per [facility] statutes, patients with seizures are not allowed to drive until they have been seizure-free for six months. Use caution when using heavy equipment or power tools. Avoid working on ladders or at heights. Take showers instead of baths. Ensure the water temperature is not too high on the home water heater. Do not go swimming alone. When caring for infants or small children, sit down when holding, feeding, or changing them to minimize risk of injury to the child in the event you have a seizure.    Admission Diagnosis  Status epilepticus ([place]) [G40.901] Altered mental status, unspecified altered mental status type [R41.82]  Discharge Diagnosis  Status epilepticus ([place]) [G40.901] Altered mental status, unspecified altered mental status type [R41.82]    Active Problems:   Metastatic cancer ([place])   Seizure ([place])   Pressure injury of skin   Malnutrition of moderate degree   Palliative care by specialist      Past Medical History:  Diagnosis Date  . Cancer ([place])   . Hyperlipemia   . Hypertension   . Seizures ([place]) 11/01/2017   no hx before today  . Solitary kidney   . Stroke [facility]) 05/2017   pt received TPA and has no deficits    Past Surgical History:  Procedure Laterality Date  . COLONOSCOPY WITH PROPOFOL N/A 05/28/2017   Procedure: COLONOSCOPY WITH PROPOFOL;  Surgeon: Milus Banister, MD;  Location: [facility] ENDOSCOPY;  Service: Endoscopy;  Laterality: N/A;  . ESOPHAGOGASTRODUODENOSCOPY N/A 05/28/2017   Procedure: ESOPHAGOGASTRODUODENOSCOPY (EGD);  Surgeon: Milus Banister, MD;  Location: [facility] ENDOSCOPY;  Service: Endoscopy;  Laterality: N/A;  . IR FLUORO GUIDE PORT INSERTION RIGHT  06/23/2017  . IR US GUIDE VASC ACCESS RIGHT  06/23/2017  . KIDNEY SURGERY         HPI  from the history and physical done on the day of admission:     CHIEF COMPLAINT:  Seizure  HISTORY OF PRESENT ILLNESS:   83 year old female with PMH as below, which is significant for renal cell carcinoma s/p nephrectomy, HTN, stroke, DVT on lovenox (eliquis d/c after GI bleeding),and current metastatic adenocarcinoma of the colon on chemotherapy. S/p 5 cycles Folfox and 2 cycles (ongoing) of Folfiri. Last dose 3/20. Her son visits her every day to inject lovenox and on 3/24 she was in her usual state of health with no complaints. When he went to her on 3/25 he found her on the floor next to her bed, unresponsive with agonal respirations. He then witnessed a seizure and called EMS. Upon arrival to the ED she was intubated for airway protection. CT scan of the  head demonstrated old stroke, but nothing new. Neurology has seen in the ED and has recommended MRI and Antiepileptic medications. PCCM has been asked to see for vent management.         Hospital Course:    Brief summary- 83 yo female with a history of renal cell carcinoma, now colon cancer who was admitted with status epilepticus   Plan:- 1)Rt LL aspiration pneumonia-clinically improving, continue Levaquin last dose 11/09/2017, continue bronchodilators, extubated 11/05/2017, patient has lung metastases from underlying colon cancer, no fevers  2)H/o DVT-  c/n Lovenox therapeutic doses  3)Chronic anemia-no evidence of active bleeding, monitor H&H and transfuse as clinically indicated  4) recurrent ascites- IR reviewed her case,  recent paracentesis which yielded 3 mL of thick, viscous fluid.   Dr Kathlene Cote and Clovia Cuff Reviewed CT scan which shows advanced  disease, omental caking, and no change since last paracentesis in appearance of fluid. No paracentesis advised  5)Colon Cancer with Metastases to the lungs-per oncology.. d/w Dr. Learta Codding, he will see pt in office on 11/10/17 to discuss further treatment options , she "failed" traditional chemoRx,  she is now on second line chemo agents, she has had 2 sessions of the second line chemo agents so far  6)Seizures--- continue Keppra, brain MRI without metastatic lesions  7)Disposition-patient is in declining health, has generalized weakness and debility, most likely will need placement to facility, but at this time patient and her son declined possible placement of facility, they would like to return home with home health and see how things go  Code Status : Partial  Disposition Plan  : home with [facility]   Consults  :  PCCM  DVT Prophylaxis  :  Lovenox Rx dose  Discharge Condition: stable  Follow UP-- Dr Learta Codding 11/10/17   Diet and Activity recommendation:  As advised  Discharge Instructions     Discharge Instructions    Call MD for:  difficulty breathing, headache or visual disturbances   Complete by:  As directed    Call MD for:  persistant dizziness or light-headedness   Complete by:  As directed    Call MD for:  persistant nausea and vomiting   Complete by:  As directed    Call MD for:  severe uncontrolled pain   Complete by:  As directed    Call MD for:  temperature >100.4   Complete by:  As directed    Diet general   Complete by:  As directed    Discharge instructions   Complete by:  As directed    1)Take medications as prescribed 2)Follow-up with your oncologist Dr. Learta Codding as scheduled on 11/10/2017 3)Per [facility] statutes, patients with seizures are not allowed to drive until they have been seizure-free for six months. Use caution when using heavy equipment or power tools. Avoid working on ladders or at heights. Take showers instead of baths. Ensure the water  temperature is not too high on the home water heater. Do not go swimming alone. When caring for infants or small children, sit down when holding, feeding, or changing them to minimize risk of injury to the child in the event you have a seizure.   Increase activity slowly   Complete by:  As directed         Discharge Medications     Allergies as of 11/08/2017      Reactions   Ceftriaxone Swelling   Lip swelling   Penicillins Hives   Has patient had a PCN  reaction causing immediate rash, facial/tongue/throat swelling, SOB or lightheadedness with hypotension: yes Has patient had a PCN reaction causing severe rash involving mucus membranes or skin necrosis: unkn Has patient had a PCN reaction that required hospitalization: yes Has patient had a PCN reaction occurring within the last 10 years: no If all of the above answers are "NO", then may proceed with Cephalosporin use.      Medication List    STOP taking these medications   ondansetron 8 MG tablet Commonly known as:  ZOFRAN     TAKE these medications   allopurinol 100 MG tablet Commonly known as:  ZYLOPRIM Take 100 mg by mouth daily.   CARTIA XT 180 MG 24 hr capsule Generic drug:  diltiazem Take 180 mg by mouth daily.   CRESTOR 20 MG tablet Generic drug:  rosuvastatin Take 20 mg by mouth daily.   dexamethasone 4 MG tablet Commonly known as:  DECADRON Take 4 mg twice a day for 3 days What changed:    how much to take  how to take this  when to take this  additional instructions   enoxaparin 80 MG/0.8ML injection Commonly known as:  LOVENOX Inject 0.8 mLs (80 mg total) into the skin daily.   HYDROcodone-acetaminophen 5-325 MG tablet Commonly known as:  NORCO Take 1 tablet by mouth every 6 (six) hours as needed for moderate pain. What changed:  how much to take   latanoprost 0.005 % ophthalmic solution Commonly known as:  XALATAN Place 1 drop into both eyes at bedtime.   levETIRAcetam 500 MG  tablet Commonly known as:  KEPPRA Take 1 tablet (500 mg total) by mouth 2 (two) times daily.   levothyroxine 75 MCG tablet Commonly known as:  SYNTHROID, LEVOTHROID Take 75 mcg by mouth daily.   lidocaine-prilocaine cream Commonly known as:  EMLA Apply to port site one hour prior to use. Do not rub in. Cover with plastic.   metoCLOPramide 5 MG tablet Commonly known as:  REGLAN Take 1 tablet (5 mg total) by mouth 3 (three) times daily before meals.   ondansetron 4 MG disintegrating tablet Commonly known as:  ZOFRAN ODT Take 2 tablets (8 mg total) by mouth every 8 (eight) hours as needed for nausea or vomiting.       Major procedures and Radiology Reports - PLEASE review detailed and final reports for all details, in brief -    Ct Abdomen Pelvis Wo Contrast  Result Date: 11/04/2017 CLINICAL DATA:  Abdominal distension, nausea and vomiting. History of metastatic colon cancer. EXAM: CT CHEST, ABDOMEN AND PELVIS WITHOUT CONTRAST TECHNIQUE: Multidetector CT imaging of the chest, abdomen and pelvis was performed following the standard protocol without IV contrast. COMPARISON:  CT scan 09/27/2017 FINDINGS: CT CHEST FINDINGS Cardiovascular: The heart is normal in size. No pericardial effusion. The aorta is normal in caliber. No dissection. Stable coronary artery calcifications. Mediastinum/Nodes: No mediastinal or hilar mass or adenopathy. Small scattered lymph nodes are stable. The endotracheal tube is in good position above the carina and the NG tube is coursing down the esophagus and into the stomach. Lungs/Pleura: Stable pulmonary metastatic disease when compared to the recent prior CT scan. Index lesion in the left upper lobe measures 17 x 16 mm and previously measured 17 x 17 mm. Index lesion in the right upper lobe near the hilum measures 23 x 20 mm and previous measured 21 x 20 mm. No new pulmonary lesions. There are small bilateral pleural effusions with overlying atelectasis, right  greater than left. Musculoskeletal: No breast masses, supraclavicular or axillary adenopathy. The bony structures are unremarkable and stable. CT ABDOMEN PELVIS FINDINGS Hepatobiliary: Stable peritoneal surface lesions involving the liver. No new lesions. Large volume ascites with mass effect on the liver. No intrahepatic biliary dilatation. The gallbladder is distended. No common bile duct dilatation. Pancreas: No mass, inflammation or ductal dilatation. Moderate pancreatic atrophy. Spleen: Small spleen.  No focal lesions. Adrenals/Urinary Tract: Status post left nephrectomy. Stable left adrenal gland adenoma. The right adrenal gland is unremarkable. The right kidney demonstrates multiple hyperdense/hemorrhagic cysts. Stomach/Bowel: The stomach, duodenum, small bowel and colon are grossly normal. No obstructive findings. Vascular/Lymphatic: Extensive and relatively stable appearing omental and peritoneal surface disease in the abdomen and pelvis. No significant change since the study 1 month ago. Stable atherosclerotic calcifications involving the aorta iliac arteries. Reproductive: The uterus and ovaries are not demonstrated. Suspect prior hysterectomy. Other: Stable pelvic tumor and large volume ascites. Diffuse and marked body wall edema. Musculoskeletal: No significant bony findings. IMPRESSION: 1. Large volume abdominal/pelvic ascites and extensive omental and peritoneal disease throughout the abdomen and pelvis not significantly changed since recent prior study. 2. Stable pulmonary metastatic lesions. 3. Small bilateral pleural effusions and bibasilar atelectasis, right greater than left. Electronically Signed   By: Marijo Sanes M.D.   On: 11/04/2017 11:02   Ct Head Wo Contrast  Result Date: 11/01/2017 CLINICAL DATA:  Focal neuro deficit suspect stroke EXAM: CT HEAD WITHOUT CONTRAST TECHNIQUE: Contiguous axial images were obtained from the base of the skull through the vertex without intravenous  contrast. COMPARISON:  CT head 05/26/2015 FINDINGS: Brain: Moderate atrophy. Negative for hydrocephalus. Hypodensity left parietal lobe compatible with chronic infarct which has occurred since the prior study. Mild chronic microvascular ischemic change in the white matter. Negative for acute infarct, hemorrhage, or mass. Vascular: Negative for hyperdense vessel Skull: Negative Sinuses/Orbits: Bilateral cataract removal. Paranasal sinuses clear. Other: None IMPRESSION: Atrophy and chronic ischemia including chronic infarct left parietal lobe. No acute abnormality. Electronically Signed   By: Franchot Gallo M.D.   On: 11/01/2017 16:49   Ct Chest Wo Contrast  Result Date: 11/04/2017 CLINICAL DATA:  Abdominal distension, nausea and vomiting. History of metastatic colon cancer. EXAM: CT CHEST, ABDOMEN AND PELVIS WITHOUT CONTRAST TECHNIQUE: Multidetector CT imaging of the chest, abdomen and pelvis was performed following the standard protocol without IV contrast. COMPARISON:  CT scan 09/27/2017 FINDINGS: CT CHEST FINDINGS Cardiovascular: The heart is normal in size. No pericardial effusion. The aorta is normal in caliber. No dissection. Stable coronary artery calcifications. Mediastinum/Nodes: No mediastinal or hilar mass or adenopathy. Small scattered lymph nodes are stable. The endotracheal tube is in good position above the carina and the NG tube is coursing down the esophagus and into the stomach. Lungs/Pleura: Stable pulmonary metastatic disease when compared to the recent prior CT scan. Index lesion in the left upper lobe measures 17 x 16 mm and previously measured 17 x 17 mm. Index lesion in the right upper lobe near the hilum measures 23 x 20 mm and previous measured 21 x 20 mm. No new pulmonary lesions. There are small bilateral pleural effusions with overlying atelectasis, right greater than left. Musculoskeletal: No breast masses, supraclavicular or axillary adenopathy. The bony structures are unremarkable  and stable. CT ABDOMEN PELVIS FINDINGS Hepatobiliary: Stable peritoneal surface lesions involving the liver. No new lesions. Large volume ascites with mass effect on the liver. No intrahepatic biliary dilatation. The gallbladder is distended. No common bile duct dilatation. Pancreas:  No mass, inflammation or ductal dilatation. Moderate pancreatic atrophy. Spleen: Small spleen.  No focal lesions. Adrenals/Urinary Tract: Status post left nephrectomy. Stable left adrenal gland adenoma. The right adrenal gland is unremarkable. The right kidney demonstrates multiple hyperdense/hemorrhagic cysts. Stomach/Bowel: The stomach, duodenum, small bowel and colon are grossly normal. No obstructive findings. Vascular/Lymphatic: Extensive and relatively stable appearing omental and peritoneal surface disease in the abdomen and pelvis. No significant change since the study 1 month ago. Stable atherosclerotic calcifications involving the aorta iliac arteries. Reproductive: The uterus and ovaries are not demonstrated. Suspect prior hysterectomy. Other: Stable pelvic tumor and large volume ascites. Diffuse and marked body wall edema. Musculoskeletal: No significant bony findings. IMPRESSION: 1. Large volume abdominal/pelvic ascites and extensive omental and peritoneal disease throughout the abdomen and pelvis not significantly changed since recent prior study. 2. Stable pulmonary metastatic lesions. 3. Small bilateral pleural effusions and bibasilar atelectasis, right greater than left. Electronically Signed   By: Marijo Sanes M.D.   On: 11/04/2017 11:02   Mr Brain Wo Contrast  Result Date: 11/02/2017 CLINICAL DATA:  Seizure. History of renal cell carcinoma, metastatic colon cancer, and stroke. EXAM: MRI HEAD WITHOUT CONTRAST TECHNIQUE: Multiplanar, multiecho pulse sequences of the brain and surrounding structures were obtained without intravenous contrast. COMPARISON:  Head CT 11/01/2017 and MRI 05/24/2017 FINDINGS: Brain: There  is no evidence of acute infarct, intracranial mass effect, or extra-axial fluid collection. A chronic left parieto-occipital infarct is noted. There is also a small chronic left temporoparietal infarct along the posterior margin of the sylvian fissure. Small chronic infarcts are present in both cerebellar hemispheres, with acute cerebellar infarcts present on the prior MRI. Scattered small foci of T2 hyperintensity in the cerebral white matter bilaterally are similar to the prior MRI and nonspecific but compatible with mild chronic small vessel ischemic disease. A 3 mm focus of signal abnormality in the posterior subcortical left frontal lobe demonstrates central T2 hyperintensity, a hemosiderin ring, and prominent susceptibility artifact most compatible with a cavernoma. There is also likely an adjacent small developmental venous anomaly. Moderate cerebral atrophy is noted. Dedicated temporal lobe imaging demonstrates symmetric volume of the hippocampi without focal signal abnormality. The pituitary gland is minimally prominent in size for age, measuring 8 mm in height with a minimally convex superior margin. Vascular: Major intracranial vascular flow voids are preserved. Skull and upper cervical spine: Unremarkable bone marrow signal. Sinuses/Orbits: Bilateral cataract extraction. Trace right mastoid fluid. At most minimal bilateral ethmoid air cell mucosal thickening. Other: None. IMPRESSION: 1. No acute intracranial abnormality. 2. Chronic ischemic changes including chronic left parieto-occipital and cerebellar infarcts. Electronically Signed   By: Logan Bores M.D.   On: 11/02/2017 19:44   Dg Chest Port 1 View  Result Date: 11/04/2017 CLINICAL DATA:  Endotracheal tube EXAM: PORTABLE CHEST 1 VIEW COMPARISON:  11/03/2017 FINDINGS: Endotracheal tube in good position. Gastric tube enters the stomach with the tip not visualized. Port-A-Cath tip in the SVC unchanged Right lower lobe volume loss with elevated  right hemidiaphragm and right lower lobe atelectasis/infiltrate unchanged. Mild left lower lobe airspace disease also unchanged. Negative for edema or effusion IMPRESSION: Endotracheal tube remains in good position Bibasilar airspace disease right greater than left unchanged. Electronically Signed   By: Franchot Gallo M.D.   On: 11/04/2017 07:23   Dg Chest Port 1 View  Result Date: 11/03/2017 CLINICAL DATA:  83 y/o  F; ET tube position. EXAM: PORTABLE CHEST 1 VIEW COMPARISON:  11/02/2017 chest radiograph FINDINGS: Low lung volumes accentuate  pulmonary markings. Right greater than left basilar opacities are stable. Stable cardiac silhouette. Endotracheal tube tip 2.2 cm above carina. Enteric tube tip below field of view and abdomen. Right port catheter tip projects over lower SVC and is stable. No acute osseous abnormality is evident. IMPRESSION: 1. Endotracheal tube tip projects 2.2 cm above carina, endotracheal tube below field of view and abdomen. 2. Stable low lung volumes and basilar opacities. Electronically Signed   By: Kristine Garbe M.D.   On: 11/03/2017 04:08   Dg Chest Port 1 View  Result Date: 11/02/2017 CLINICAL DATA:  Hypoxia EXAM: PORTABLE CHEST 1 VIEW COMPARISON:  November 01, 2017 FINDINGS: Endotracheal tube tip is 3.1 cm above the carina. Central catheter tip is in superior vena cava. Nasogastric tube tip is below the diaphragm with side port in stomach. No pneumothorax. There is airspace consolidation in the right lower lobe with small right pleural effusion. There is consolidation in the medial left base as well. Heart size and pulmonary vascularity are normal. No adenopathy. No bone lesions. IMPRESSION: Tube and catheter positions as described without pneumothorax. Consolidation both lung zones, considerably more on the right than on the left. Heart size within normal limits. Electronically Signed   By: Lowella Grip III M.D.   On: 11/02/2017 08:10   Dg Chest Port 1  View  Result Date: 11/01/2017 CLINICAL DATA:  Altered mental status EXAM: PORTABLE CHEST 1 VIEW COMPARISON:  08/11/2017 FINDINGS: Right Port-A-Cath remains in place, unchanged. Endotracheal tube is 3.2 cm above the carina. NG tube is in the stomach. Mild elevation of the right hemidiaphragm. Right lower lobe airspace opacity concerning for consolidation. Rounded masses and nodules noted in both lungs, similar to prior study compatible with metastatic disease. Left base atelectasis or early infiltrate. No visible effusions. IMPRESSION: Bilateral pulmonary nodules and masses compatible with metastases. Or confluent opacity now present in the right lung base concerning for consolidation/pneumonia. Left base atelectasis or early infiltrate. Endotracheal tube 3.2 cm above the carina. Electronically Signed   By: Rolm Baptise M.D.   On: 11/01/2017 16:17    Micro Results    Recent Results (from the past 240 hour(s))  Blood culture (routine x 2)     Status: None   Collection Time: 11/01/17  3:33 PM  Result Value Ref Range Status   Specimen Description BLOOD RIGHT PORTA CATH  Final   Special Requests IN PEDIATRIC BOTTLE Blood Culture adequate volume  Final   Culture   Final    NO GROWTH 5 DAYS Performed at [facility], [address] [place], [place] [address]    Report Status 11/06/2017 FINAL  Final  Blood culture (routine x 2)     Status: Abnormal   Collection Time: 11/01/17  4:17 PM  Result Value Ref Range Status   Specimen Description BLOOD LEFT WRIST  Final   Special Requests   Final    BOTTLES DRAWN AEROBIC ONLY Blood Culture adequate volume   Culture  Setup Time   Final    GRAM POSITIVE RODS AEROBIC BOTTLE ONLY CRITICAL RESULT CALLED TO, READ BACK BY AND VERIFIED WITH: DRAYMOND,PHARMD @2024  11/05/17 BY LHOWARD    Culture (A)  Final    CORYNEBACTERIUM PSEUDODIPHTHERIAE Standardized susceptibility testing for this organism is not available.    Report Status 11/08/2017 FINAL   Final  Blood Culture ID Panel (Reflexed)     Status: None   Collection Time: 11/01/17  4:17 PM  Result Value Ref Range Status  Enterococcus species NOT DETECTED NOT DETECTED Final   Listeria monocytogenes NOT DETECTED NOT DETECTED Final   Staphylococcus species NOT DETECTED NOT DETECTED Final   Staphylococcus aureus NOT DETECTED NOT DETECTED Final   Streptococcus species NOT DETECTED NOT DETECTED Final   Streptococcus agalactiae NOT DETECTED NOT DETECTED Final   Streptococcus pneumoniae NOT DETECTED NOT DETECTED Final   Streptococcus pyogenes NOT DETECTED NOT DETECTED Final   Acinetobacter baumannii NOT DETECTED NOT DETECTED Final   Enterobacteriaceae species NOT DETECTED NOT DETECTED Final   Enterobacter cloacae complex NOT DETECTED NOT DETECTED Final   Escherichia coli NOT DETECTED NOT DETECTED Final   Klebsiella oxytoca NOT DETECTED NOT DETECTED Final   Klebsiella pneumoniae NOT DETECTED NOT DETECTED Final   Proteus species NOT DETECTED NOT DETECTED Final   Serratia marcescens NOT DETECTED NOT DETECTED Final   Haemophilus influenzae NOT DETECTED NOT DETECTED Final   Neisseria meningitidis NOT DETECTED NOT DETECTED Final   Pseudomonas aeruginosa NOT DETECTED NOT DETECTED Final   Candida albicans NOT DETECTED NOT DETECTED Final   Candida glabrata NOT DETECTED NOT DETECTED Final   Candida krusei NOT DETECTED NOT DETECTED Final   Candida parapsilosis NOT DETECTED NOT DETECTED Final   Candida tropicalis NOT DETECTED NOT DETECTED Final    Comment: Performed at [facility], [address] [place], [place] [address]  Urine Culture     Status: None   Collection Time: 11/01/17  5:36 PM  Result Value Ref Range Status   Specimen Description URINE, RANDOM  Final   Special Requests NONE  Final   Culture   Final    NO GROWTH Performed at [facility], [place] [address], [place], [place] [address]    Report Status 11/03/2017 FINAL  Final  MRSA PCR Screening     Status: None    Collection Time: 11/01/17  8:33 PM  Result Value Ref Range Status   MRSA by PCR NEGATIVE NEGATIVE Final    Comment:        The GeneXpert MRSA Assay (FDA approved for NASAL specimens only), is one component of a comprehensive MRSA colonization surveillance program. It is not intended to diagnose MRSA infection nor to guide or monitor treatment for MRSA infections. Performed at [facility], [place] [address] [place], [place] [address]        Today   Subjective    Karmen Altamirano today has no new complaints, son at bedside,  patient and her son declined possible placement of facility, they would like to return home with home health and see how things go          Patient has been seen and examined prior to discharge   Objective   Blood pressure 111/65, pulse 85, temperature 98.2 F (36.8 C), temperature source Oral, resp. rate 18, height 5\' 3"  (1.6 m), weight 82.8 kg (182 lb 8.7 oz), SpO2 98 %.   Intake/Output Summary (Last 24 hours) at 11/08/2017 1206 Last data filed at 11/08/2017 0940 Gross per 24 hour  Intake 357 ml  Output -  Net 357 ml    Exam  Gen:- Awake Alert, in no acute distress  HEENT:- [place].AT, No sclera icterus Neck-Supple Neck,No JVD,.  Lungs-  CTAB , fair air movement bilaterally CV- S1, S2 normal, right subclavian area Port-A-Cath site is clean dry and intact Abd-  +ve B.Sounds, abdominal distention/ascites, epigastric/ventral area hernia    Extremity/Skin:- No  edema,   good pulses Psych-affect is appropriate, oriented x3 Neuro-no  new focal deficits, no tremors   Data Review   CBC w Diff:  Lab Results  Component Value Date   WBC 4.8 11/07/2017   HGB 8.4 (L) 11/07/2017   HGB 10.1 (L) 08/11/2017   HCT 26.3 (L) 11/07/2017   HCT 31.3 (L) 08/11/2017   PLT 178 11/07/2017   PLT 511 (H) 10/27/2017   PLT 290 08/11/2017   LYMPHOPCT 24 11/07/2017   LYMPHOPCT 18.0 08/11/2017   MONOPCT 8 11/07/2017   MONOPCT 22.4 (H) 08/11/2017   EOSPCT 1  11/07/2017   EOSPCT 0.3 08/11/2017   BASOPCT 0 11/07/2017   BASOPCT 0.5 08/11/2017    CMP:  Lab Results  Component Value Date   NA 144 11/06/2017   NA 139 08/11/2017   K 3.5 11/06/2017   K 4.0 08/11/2017   CL 118 (H) 11/06/2017   CO2 17 (L) 11/06/2017   CO2 15 (L) 08/11/2017   BUN 16 11/06/2017   BUN 23.5 08/11/2017   CREATININE 1.83 (H) 11/06/2017   CREATININE 1.71 (H) 10/27/2017   CREATININE 2.7 (H) 08/11/2017   PROT 6.0 (L) 11/01/2017   PROT 6.6 08/11/2017   ALBUMIN 2.2 (L) 11/01/2017   ALBUMIN 2.6 (L) 08/11/2017   BILITOT 0.3 11/01/2017   BILITOT 0.3 10/27/2017   BILITOT 0.38 08/11/2017   ALKPHOS 98 11/01/2017   ALKPHOS 131 08/11/2017   AST 41 11/01/2017   AST 10 10/27/2017   AST 16 08/11/2017   ALT 13 (L) 11/01/2017   ALT 7 10/27/2017   ALT 9 08/11/2017   Total Discharge time is about 34 minutes  Roxan Hockey M.D on 11/08/2017 at 12:06 PM  [facility]   Office  [phone]  Voice Recognition Viviann Spare dictation system was used to create this note, attempts have been made to correct errors. Please contact the author with questions and/or clarifications.

## 2017-11-08 NOTE — Discharge Instructions (Signed)
1) take medications as prescribed 2) follow-up with your oncologist Dr. Learta Codding as scheduled on 11/10/2017 3)Per [facility] statutes, patients with seizures are not allowed to drive until they have been seizure-free for six months. Use caution when using heavy equipment or power tools. Avoid working on ladders or at heights. Take showers instead of baths. Ensure the water temperature is not too high on the home water heater. Do not go swimming alone. When caring for infants or small children, sit down when holding, feeding, or changing them to minimize risk of injury to the child in the event you have a seizure.

## 2017-11-10 ENCOUNTER — Encounter: Payer: Self-pay | Admitting: Oncology

## 2017-11-10 ENCOUNTER — Inpatient Hospital Stay: Payer: BC Managed Care – PPO | Attending: Nurse Practitioner | Admitting: Oncology

## 2017-11-10 ENCOUNTER — Inpatient Hospital Stay: Payer: BC Managed Care – PPO

## 2017-11-10 ENCOUNTER — Telehealth: Payer: Self-pay | Admitting: *Deleted

## 2017-11-10 ENCOUNTER — Telehealth: Payer: Self-pay | Admitting: Oncology

## 2017-11-10 VITALS — BP 108/68 | HR 86 | Temp 98.5°F | Resp 18 | Ht 63.0 in | Wt 171.7 lb

## 2017-11-10 DIAGNOSIS — D709 Neutropenia, unspecified: Secondary | ICD-10-CM

## 2017-11-10 DIAGNOSIS — D500 Iron deficiency anemia secondary to blood loss (chronic): Secondary | ICD-10-CM | POA: Insufficient documentation

## 2017-11-10 DIAGNOSIS — Z8541 Personal history of malignant neoplasm of cervix uteri: Secondary | ICD-10-CM | POA: Insufficient documentation

## 2017-11-10 DIAGNOSIS — C186 Malignant neoplasm of descending colon: Secondary | ICD-10-CM

## 2017-11-10 DIAGNOSIS — R918 Other nonspecific abnormal finding of lung field: Secondary | ICD-10-CM | POA: Insufficient documentation

## 2017-11-10 DIAGNOSIS — J189 Pneumonia, unspecified organism: Secondary | ICD-10-CM | POA: Insufficient documentation

## 2017-11-10 DIAGNOSIS — Z8673 Personal history of transient ischemic attack (TIA), and cerebral infarction without residual deficits: Secondary | ICD-10-CM | POA: Diagnosis not present

## 2017-11-10 DIAGNOSIS — R197 Diarrhea, unspecified: Secondary | ICD-10-CM | POA: Diagnosis not present

## 2017-11-10 DIAGNOSIS — D369 Benign neoplasm, unspecified site: Secondary | ICD-10-CM | POA: Insufficient documentation

## 2017-11-10 DIAGNOSIS — R140 Abdominal distension (gaseous): Secondary | ICD-10-CM | POA: Diagnosis not present

## 2017-11-10 DIAGNOSIS — N289 Disorder of kidney and ureter, unspecified: Secondary | ICD-10-CM | POA: Insufficient documentation

## 2017-11-10 DIAGNOSIS — R1310 Dysphagia, unspecified: Secondary | ICD-10-CM | POA: Insufficient documentation

## 2017-11-10 DIAGNOSIS — C786 Secondary malignant neoplasm of retroperitoneum and peritoneum: Secondary | ICD-10-CM | POA: Insufficient documentation

## 2017-11-10 DIAGNOSIS — R188 Other ascites: Secondary | ICD-10-CM | POA: Diagnosis not present

## 2017-11-10 DIAGNOSIS — Z86718 Personal history of other venous thrombosis and embolism: Secondary | ICD-10-CM | POA: Insufficient documentation

## 2017-11-10 DIAGNOSIS — C800 Disseminated malignant neoplasm, unspecified: Secondary | ICD-10-CM

## 2017-11-10 DIAGNOSIS — R569 Unspecified convulsions: Secondary | ICD-10-CM | POA: Diagnosis not present

## 2017-11-10 LAB — CMP (CANCER CENTER ONLY)
ALK PHOS: 94 U/L (ref 40–150)
ALT: 8 U/L (ref 0–55)
AST: 11 U/L (ref 5–34)
Albumin: 2.3 g/dL — ABNORMAL LOW (ref 3.5–5.0)
Anion gap: 8 (ref 3–11)
BILIRUBIN TOTAL: 0.2 mg/dL (ref 0.2–1.2)
BUN: 11 mg/dL (ref 7–26)
CALCIUM: 9.9 mg/dL (ref 8.4–10.4)
CO2: 17 mmol/L — ABNORMAL LOW (ref 22–29)
CREATININE: 1.73 mg/dL — AB (ref 0.60–1.10)
Chloride: 121 mmol/L — ABNORMAL HIGH (ref 98–109)
GFR, EST NON AFRICAN AMERICAN: 27 mL/min — AB (ref >60)
GFR, Est AFR Am: 31 mL/min — ABNORMAL LOW (ref >60)
Glucose, Bld: 105 mg/dL (ref 70–140)
Potassium: 3 mmol/L — CL (ref 3.5–5.1)
Sodium: 146 mmol/L — ABNORMAL HIGH (ref 136–145)
TOTAL PROTEIN: 5.8 g/dL — AB (ref 6.4–8.3)

## 2017-11-10 LAB — CBC WITH DIFFERENTIAL (CANCER CENTER ONLY)
BASOS ABS: 0 10*3/uL (ref 0.0–0.1)
BASOS PCT: 0 %
EOS ABS: 0.1 10*3/uL (ref 0.0–0.5)
Eosinophils Relative: 4 %
HCT: 28.8 % — ABNORMAL LOW (ref 34.8–46.6)
HEMOGLOBIN: 9.2 g/dL — AB (ref 11.6–15.9)
Lymphocytes Relative: 26 %
Lymphs Abs: 0.9 10*3/uL (ref 0.9–3.3)
MCH: 28.1 pg (ref 25.1–34.0)
MCHC: 31.9 g/dL (ref 31.5–36.0)
MCV: 88.1 fL (ref 79.5–101.0)
Monocytes Absolute: 0.4 10*3/uL (ref 0.1–0.9)
Monocytes Relative: 12 %
NEUTROS PCT: 58 %
Neutro Abs: 2 10*3/uL (ref 1.5–6.5)
Platelet Count: 182 10*3/uL (ref 145–400)
RBC: 3.27 MIL/uL — AB (ref 3.70–5.45)
RDW: 20.9 % — ABNORMAL HIGH (ref 11.2–14.5)
WBC: 3.4 10*3/uL — AB (ref 3.9–10.3)

## 2017-11-10 LAB — CEA (IN HOUSE-CHCC): CEA (CHCC-IN HOUSE): 15.8 ng/mL — AB (ref 0.00–5.00)

## 2017-11-10 MED ORDER — POTASSIUM CHLORIDE CRYS ER 20 MEQ PO TBCR: 20.0000 meq | EXTENDED_RELEASE_TABLET | Freq: Every day | ORAL | 0 refills | Status: DC

## 2017-11-10 MED ORDER — HEPARIN SOD (PORK) LOCK FLUSH 100 UNIT/ML IV SOLN
500.0000 [IU] | Freq: Once | INTRAVENOUS | Status: AC
  Administered 2017-11-10: 500 [IU]
  Filled 2017-11-10: qty 5

## 2017-11-10 MED ORDER — SODIUM CHLORIDE 0.9% FLUSH
10.0000 mL | Freq: Once | INTRAVENOUS | Status: AC
  Administered 2017-11-10: 10 mL
  Filled 2017-11-10: qty 10

## 2017-11-10 NOTE — Patient Instructions (Signed)

## 2017-11-10 NOTE — Telephone Encounter (Signed)
Voicemail from [place] in lab: critical potassium 3.0. Dr. Benay Spice was already aware, order received for pt to start KCL 44meq daily. [place] dto pharmacy, confirmed instructions with son. Teach back complete.

## 2017-11-10 NOTE — Progress Notes (Signed)
[facility] OFFICE PROGRESS NOTE   Diagnosis: Colon cancer  INTERVAL HISTORY:   Shawna Schneider returns for a scheduled visit.  She completed another cycle of FOLFIRI 10/27/2017.  She was found unresponsive by her son 11/01/2017.  He witnessed a seizure and called MS. She was transferred to [facility].  She was diagnosed with a right lower lobe pneumonia.  A brain MRI revealed no acute abnormality.  CTs of the abdomen and pelvis revealed no significant change in ascites and omental/peritoneal disease.  Stable pulmonary metastases.  She was intubated and pulmonary medicine was consulted.  She was extubated 11/05/2017. Shawna Schneider was discharged from the hospital on 11/08/2017.  She has been living with her son since discharge from the hospital.  She complains of general weakness.  Diarrhea is relieved with Imodium.  No pain.  She continues Lovenox anticoagulation.  No further seizures. Objective:  Vital signs in last 24 hours:  Blood pressure 108/68, pulse 86, temperature 98.5 F (36.9 C), temperature source Oral, resp. rate 18, height 5' 3"  (1.6 m), weight 171 lb 11.2 oz (77.9 kg), SpO2 100 %.    HEENT: No thrush or ulcers Resp: End inspiratory rales at the right greater than left base, no respiratory distress Cardio: Regular rate and rhythm GI: Distended, nontender, no mass Vascular: 1+ edema at the right lower leg, trace edema at the left lower leg Neuro: Alert, follows commands    Portacath/PICC-without erythema  Lab Results:  Lab Results  Component Value Date   WBC 3.4 (L) 11/10/2017   HGB 8.4 (L) 11/07/2017   HCT 28.8 (L) 11/10/2017   MCV 88.1 11/10/2017   PLT 182 11/10/2017   NEUTROABS 2.0 11/10/2017    CMP     Component Value Date/Time   NA 146 (H) 11/10/2017 1031   NA 139 08/11/2017 0819   K 3.0 (LL) 11/10/2017 1031   K 4.0 08/11/2017 0819   CL 121 (H) 11/10/2017 1031   CO2 17 (L) 11/10/2017 1031   CO2 15 (L) 08/11/2017 0819   GLUCOSE 105  11/10/2017 1031   GLUCOSE 113 08/11/2017 0819   BUN 11 11/10/2017 1031   BUN 23.5 08/11/2017 0819   CREATININE 1.73 (H) 11/10/2017 1031   CREATININE 2.7 (H) 08/11/2017 0819   CALCIUM 9.9 11/10/2017 1031   CALCIUM 9.8 08/11/2017 0819   PROT 5.8 (L) 11/10/2017 1031   PROT 6.6 08/11/2017 0819   ALBUMIN 2.3 (L) 11/10/2017 1031   ALBUMIN 2.6 (L) 08/11/2017 0819   AST 11 11/10/2017 1031   AST 16 08/11/2017 0819   ALT 8 11/10/2017 1031   ALT 9 08/11/2017 0819   ALKPHOS 94 11/10/2017 1031   ALKPHOS 131 08/11/2017 0819   BILITOT 0.2 11/10/2017 1031   BILITOT 0.38 08/11/2017 0819   GFRNONAA 27 (L) 11/10/2017 1031   GFRAA 31 (L) 11/10/2017 1031    Lab Results  Component Value Date   CEA1 15.17 (H) 10/20/2017     Medications: I have reviewed the patient's current medications.   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 Findings consistent with a gastrointestinal or pancreatico/biliary primary.  Cycle 1 FOLFOX 06/28/2017  Cycle 2 FOLFOX 07/24/2017  Cycle 3 FOLFOX 08/18/2017  Cycle 4 FOLFOX 09/01/2017  Cycle 5 FOLFOX 09/15/2017  CTs  09/27/2017- increased ascites and omental nodularity, stable and increased pulmonary nodules, increased capsular liver deposit  Cycle 1 FOLFIRI 10/07/2017  Cycle 2 FOLFIRI 10/27/2017 (irinotecan dose reduced) Shawna Schneider appears stable.  She will complete a second cycle of FOLFIRI today. Cherylann Banas testing on peripheral blood- MSI-high, not detected  2. 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 GI bleeding after TPA 05/24/2017-resolved  4. Bilateral lower  extremity DVTs confirmed on Doppler 05/25/2017--initially onEliquis.Eliquis discontinued due to a GI bleed December 2018.  Negative bilateral lower extremity Doppler 07/17/2017  5. Small PFO  6. Cervical cancer at age 39  7. Papillary renal cell carcinoma 2006, status post left nephrectomy  8.Renal insufficiency. Followed by Dr. Moshe Cipro.  9.Port-A-Cath placement 06/23/2017  10.Left hip/leg pain. Likely related to #1.Resolved 07/12/2017.  11.Hospitalization with GI bleed 07/17/2017 through 07/19/2017.  12.Anemia secondary to GI blood loss, renal failure, chemotherapy. Red cell transfusion 07/13/2017.  13.Right lower extremity DVT/right upper lobe pulmonary embolism1/09/2017. On Lovenox.  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  16.Acute and delayed nausea secondary to chemotherapy.  17.  Unresponsive episode/seizure 11/01/2017- maintained on Keppra   Disposition: Shawna Schneider has metastatic colon cancer.  She has completed 2 treatments with second line FOLFIRI chemotherapy.  She was recently admitted with a seizure and episode of unresponsiveness.  The etiology of the seizure is unclear.  She is now maintained on Keppra.  Her overall performance status has declined over recent weeks.  She is not a candidate for further chemotherapy.  I discussed the current status and treatment options with Shawna Schneider and her son.  She agrees to a [facility] referral.  We discussed CPR and ACLS issues.  She will be placed on a no CODE BLUE status.  Shawna Schneider would like to continue Lovenox anticoagulation for now.  She will return for an office visit in 2 weeks.  Betsy Coder, MD  11/10/2017  11:39 AM

## 2017-11-10 NOTE — Telephone Encounter (Signed)
Scheduled appt per 4/3 los - sent reminder letter in the mail and left message with appt date and time.

## 2017-11-12 LAB — GUARDANT 360

## 2017-11-23 ENCOUNTER — Telehealth: Payer: Self-pay | Admitting: Oncology

## 2017-11-23 ENCOUNTER — Encounter: Payer: Self-pay | Admitting: Nurse Practitioner

## 2017-11-23 ENCOUNTER — Inpatient Hospital Stay (HOSPITAL_BASED_OUTPATIENT_CLINIC_OR_DEPARTMENT_OTHER): Payer: BC Managed Care – PPO | Admitting: Nurse Practitioner

## 2017-11-23 VITALS — BP 108/79 | HR 97 | Temp 97.5°F | Resp 18 | Ht 63.0 in | Wt 171.9 lb

## 2017-11-23 DIAGNOSIS — R188 Other ascites: Secondary | ICD-10-CM

## 2017-11-23 DIAGNOSIS — R1310 Dysphagia, unspecified: Secondary | ICD-10-CM

## 2017-11-23 DIAGNOSIS — R918 Other nonspecific abnormal finding of lung field: Secondary | ICD-10-CM | POA: Diagnosis not present

## 2017-11-23 DIAGNOSIS — N289 Disorder of kidney and ureter, unspecified: Secondary | ICD-10-CM

## 2017-11-23 DIAGNOSIS — R140 Abdominal distension (gaseous): Secondary | ICD-10-CM

## 2017-11-23 DIAGNOSIS — Z8541 Personal history of malignant neoplasm of cervix uteri: Secondary | ICD-10-CM

## 2017-11-23 DIAGNOSIS — J189 Pneumonia, unspecified organism: Secondary | ICD-10-CM

## 2017-11-23 DIAGNOSIS — R197 Diarrhea, unspecified: Secondary | ICD-10-CM

## 2017-11-23 DIAGNOSIS — C189 Malignant neoplasm of colon, unspecified: Secondary | ICD-10-CM

## 2017-11-23 DIAGNOSIS — Z86718 Personal history of other venous thrombosis and embolism: Secondary | ICD-10-CM

## 2017-11-23 DIAGNOSIS — C186 Malignant neoplasm of descending colon: Secondary | ICD-10-CM | POA: Diagnosis not present

## 2017-11-23 DIAGNOSIS — Z8673 Personal history of transient ischemic attack (TIA), and cerebral infarction without residual deficits: Secondary | ICD-10-CM

## 2017-11-23 DIAGNOSIS — D500 Iron deficiency anemia secondary to blood loss (chronic): Secondary | ICD-10-CM

## 2017-11-23 DIAGNOSIS — C786 Secondary malignant neoplasm of retroperitoneum and peritoneum: Secondary | ICD-10-CM

## 2017-11-23 DIAGNOSIS — R569 Unspecified convulsions: Secondary | ICD-10-CM

## 2017-11-23 DIAGNOSIS — D709 Neutropenia, unspecified: Secondary | ICD-10-CM

## 2017-11-23 DIAGNOSIS — C799 Secondary malignant neoplasm of unspecified site: Principal | ICD-10-CM

## 2017-11-23 DIAGNOSIS — D369 Benign neoplasm, unspecified site: Secondary | ICD-10-CM

## 2017-11-23 MED ORDER — ONDANSETRON 4 MG PO TBDP: 8.0000 mg | ORAL_TABLET | Freq: Three times a day (TID) | ORAL | 2 refills | Status: DC | PRN

## 2017-11-23 NOTE — Telephone Encounter (Signed)
Scheduled appt per 4/16 los - left message and sent reminder letter in the mail.

## 2017-11-23 NOTE — Progress Notes (Addendum)
[facility] OFFICE PROGRESS NOTE   Diagnosis: Colon cancer  INTERVAL HISTORY:   Shawna Schneider returns as scheduled.  She is now followed by the [facility] program.  She continues to note abdominal distention.  She has progressive leg edema.  She is able to tolerate liquids without difficulty.  With solids and/or pills she notes difficulty swallowing followed by regurgitation.  She is having 1-2 loose stools a day.  She denies bleeding.  Objective:  Vital signs in last 24 hours:  Blood pressure 108/79, pulse 97, temperature (!) 97.5 F (36.4 C), temperature source Oral, resp. rate 18, height 5' 3"  (1.6 m), weight 171 lb 14.4 oz (78 kg), SpO2 98 %.    HEENT: White coating over tongue.  No buccal thrush. Resp: Lungs clear bilaterally. Cardio: Regular rate and rhythm. GI: Abdomen is distended.  Exam consistent with ascites. Vascular: Edema throughout the right leg.  Edema left leg below the knee. Neuro: Alert and oriented. Port-A-Cath without erythema.  Lab Results:  Lab Results  Component Value Date   WBC 3.4 (L) 11/10/2017   HGB 8.4 (L) 11/07/2017   HCT 28.8 (L) 11/10/2017   MCV 88.1 11/10/2017   PLT 182 11/10/2017   NEUTROABS 2.0 11/10/2017    Imaging:  No results found.  Medications: I have reviewed the patient's current medications.  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 Findings consistent with a gastrointestinal or pancreatico/biliary primary.  Cycle 1 FOLFOX 06/28/2017  Cycle 2 FOLFOX 07/24/2017  Cycle 3 FOLFOX 08/18/2017  Cycle 4 FOLFOX 09/01/2017  Cycle 5 FOLFOX 09/15/2017  CTs  09/27/2017- increased ascites and omental nodularity, stable and increased pulmonary nodules, increased capsular liver deposit  Cycle 1 FOLFIRI 10/07/2017  Cycle 2 FOLFIRI 10/27/2017 (irinotecan dose reduced) Shawna Schneider appears stable. She will complete a second cycle of FOLFIRI today. Shawna Schneider testing on peripheral blood- MSI-high, not detected  2. 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 GI bleeding after TPA 05/24/2017-resolved  4. Bilateral lower extremity DVTs confirmed on Doppler 05/25/2017--initially onEliquis.Eliquis discontinued due to a GI bleed December 2018.  Negative bilateral lower extremity Doppler 07/17/2017  5. Small PFO  6. Cervical cancer at age 83  7. Papillary renal cell carcinoma 2006, status post left nephrectomy  8.Renal insufficiency. Followed by Dr. Moshe Cipro.  9.Port-A-Cath placement 06/23/2017  10.Left hip/leg pain. Likely related to #1.Resolved 07/12/2017.  11.Hospitalization with GI bleed 07/17/2017 through 07/19/2017.  12.Anemia secondary to GI blood loss, renal failure, chemotherapy. Red cell transfusion 07/13/2017.  13.Right lower extremity DVT/right upper lobe pulmonary embolism1/09/2017. On Lovenox.  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  16.Acute and delayed nausea secondary to chemotherapy.  17.  Unresponsive episode/seizure 11/01/2017- maintained on Keppra     Disposition: Shawna Schneider appears unchanged.  She is now followed by the [facility] program.  Plan to continue supportive/comfort measures.  She is experiencing dysphagia to solids.  She would like to eat solid food and is interested in proceeding with further evaluation.  We are referring her for a barium swallow.  She will return for a follow-up visit in 2-3 weeks.  She will contact the office in the interim  with any problems.  Patient  seen with Dr. Benay Spice.    Ned Card ANP/GNP-BC   11/23/2017  12:24 PM This was a shared visit with Ned Card.  Shawna Schneider has enrolled in the [facility] hospice program.  She will be scheduled for a swallow study to evaluate the dysphasia.  Julieanne Manson, MD

## 2017-11-26 ENCOUNTER — Telehealth: Payer: Self-pay | Admitting: *Deleted

## 2017-11-26 ENCOUNTER — Other Ambulatory Visit: Payer: Self-pay | Admitting: Nurse Practitioner

## 2017-11-26 DIAGNOSIS — C186 Malignant neoplasm of descending colon: Secondary | ICD-10-CM

## 2017-11-26 NOTE — Telephone Encounter (Signed)
error 

## 2017-11-26 NOTE — Telephone Encounter (Signed)
This RN received call from pt's husband stating urgent concern due to need to refill lovenox before the weekend.  Noted prescription request sent - refill completed while on the phone with pt's husband.

## 2017-12-01 ENCOUNTER — Ambulatory Visit (HOSPITAL_COMMUNITY)
Admission: RE | Admit: 2017-12-01 | Discharge: 2017-12-01 | Disposition: A | Discharge status: Home / Self Care | Payer: BC Managed Care – PPO | Source: Ambulatory Visit | Attending: Nurse Practitioner | Admitting: Nurse Practitioner

## 2017-12-01 ENCOUNTER — Telehealth: Payer: Self-pay

## 2017-12-01 DIAGNOSIS — R9389 Abnormal findings on diagnostic imaging of other specified body structures: Secondary | ICD-10-CM | POA: Insufficient documentation

## 2017-12-01 DIAGNOSIS — C189 Malignant neoplasm of colon, unspecified: Secondary | ICD-10-CM | POA: Diagnosis present

## 2017-12-01 DIAGNOSIS — K449 Diaphragmatic hernia without obstruction or gangrene: Secondary | ICD-10-CM | POA: Diagnosis not present

## 2017-12-01 DIAGNOSIS — C799 Secondary malignant neoplasm of unspecified site: Secondary | ICD-10-CM | POA: Diagnosis present

## 2017-12-01 DIAGNOSIS — K224 Dyskinesia of esophagus: Secondary | ICD-10-CM | POA: Diagnosis not present

## 2017-12-01 DIAGNOSIS — R1310 Dysphagia, unspecified: Secondary | ICD-10-CM | POA: Diagnosis present

## 2017-12-01 NOTE — Telephone Encounter (Signed)
Spoke with son regarding pt. Son states that his mother had a procedure done today and he wants to know the next steps "because she isn't able to eat". This RN sees that pt had a DG esophagus procedure done. Will consult MD and call back with reposnse. Son voiced understanding.

## 2017-12-02 NOTE — Telephone Encounter (Signed)
Needs referral to Dr. Ardis Hughs for endoscopy, consider dilatation

## 2017-12-03 ENCOUNTER — Emergency Department (HOSPITAL_COMMUNITY): Payer: BC Managed Care – PPO

## 2017-12-03 ENCOUNTER — Encounter (HOSPITAL_COMMUNITY): Payer: Self-pay

## 2017-12-03 ENCOUNTER — Inpatient Hospital Stay (HOSPITAL_COMMUNITY)
Admission: EM | Admit: 2017-12-03 | Discharge: 2017-12-13 | DRG: 640 | Disposition: A | Discharge status: Hospice | Payer: BC Managed Care – PPO | Attending: Internal Medicine | Admitting: Internal Medicine

## 2017-12-03 ENCOUNTER — Telehealth: Payer: Self-pay

## 2017-12-03 ENCOUNTER — Other Ambulatory Visit: Payer: Self-pay

## 2017-12-03 DIAGNOSIS — R159 Full incontinence of feces: Secondary | ICD-10-CM | POA: Diagnosis present

## 2017-12-03 DIAGNOSIS — K210 Gastro-esophageal reflux disease with esophagitis: Secondary | ICD-10-CM | POA: Diagnosis present

## 2017-12-03 DIAGNOSIS — R0600 Dyspnea, unspecified: Secondary | ICD-10-CM | POA: Diagnosis present

## 2017-12-03 DIAGNOSIS — R112 Nausea with vomiting, unspecified: Secondary | ICD-10-CM | POA: Diagnosis not present

## 2017-12-03 DIAGNOSIS — G40909 Epilepsy, unspecified, not intractable, without status epilepticus: Secondary | ICD-10-CM

## 2017-12-03 DIAGNOSIS — N179 Acute kidney failure, unspecified: Secondary | ICD-10-CM | POA: Diagnosis present

## 2017-12-03 DIAGNOSIS — R569 Unspecified convulsions: Secondary | ICD-10-CM

## 2017-12-03 DIAGNOSIS — Z86711 Personal history of pulmonary embolism: Secondary | ICD-10-CM

## 2017-12-03 DIAGNOSIS — I120 Hypertensive chronic kidney disease with stage 5 chronic kidney disease or end stage renal disease: Secondary | ICD-10-CM | POA: Diagnosis present

## 2017-12-03 DIAGNOSIS — K2210 Ulcer of esophagus without bleeding: Secondary | ICD-10-CM | POA: Diagnosis not present

## 2017-12-03 DIAGNOSIS — Z66 Do not resuscitate: Secondary | ICD-10-CM | POA: Diagnosis present

## 2017-12-03 DIAGNOSIS — Z85528 Personal history of other malignant neoplasm of kidney: Secondary | ICD-10-CM

## 2017-12-03 DIAGNOSIS — Z9221 Personal history of antineoplastic chemotherapy: Secondary | ICD-10-CM

## 2017-12-03 DIAGNOSIS — T17908A Unspecified foreign body in respiratory tract, part unspecified causing other injury, initial encounter: Secondary | ICD-10-CM

## 2017-12-03 DIAGNOSIS — Z8673 Personal history of transient ischemic attack (TIA), and cerebral infarction without residual deficits: Secondary | ICD-10-CM | POA: Diagnosis not present

## 2017-12-03 DIAGNOSIS — Z8541 Personal history of malignant neoplasm of cervix uteri: Secondary | ICD-10-CM | POA: Diagnosis not present

## 2017-12-03 DIAGNOSIS — Z905 Acquired absence of kidney: Secondary | ICD-10-CM

## 2017-12-03 DIAGNOSIS — C786 Secondary malignant neoplasm of retroperitoneum and peritoneum: Secondary | ICD-10-CM | POA: Diagnosis present

## 2017-12-03 DIAGNOSIS — C7951 Secondary malignant neoplasm of bone: Secondary | ICD-10-CM | POA: Diagnosis present

## 2017-12-03 DIAGNOSIS — R1310 Dysphagia, unspecified: Secondary | ICD-10-CM

## 2017-12-03 DIAGNOSIS — Z79899 Other long term (current) drug therapy: Secondary | ICD-10-CM | POA: Diagnosis not present

## 2017-12-03 DIAGNOSIS — N1330 Unspecified hydronephrosis: Secondary | ICD-10-CM | POA: Diagnosis present

## 2017-12-03 DIAGNOSIS — R1319 Other dysphagia: Secondary | ICD-10-CM

## 2017-12-03 DIAGNOSIS — E039 Hypothyroidism, unspecified: Secondary | ICD-10-CM | POA: Diagnosis present

## 2017-12-03 DIAGNOSIS — J690 Pneumonitis due to inhalation of food and vomit: Secondary | ICD-10-CM | POA: Diagnosis not present

## 2017-12-03 DIAGNOSIS — K222 Esophageal obstruction: Secondary | ICD-10-CM

## 2017-12-03 DIAGNOSIS — Z881 Allergy status to other antibiotic agents status: Secondary | ICD-10-CM | POA: Diagnosis not present

## 2017-12-03 DIAGNOSIS — E669 Obesity, unspecified: Secondary | ICD-10-CM | POA: Diagnosis present

## 2017-12-03 DIAGNOSIS — C799 Secondary malignant neoplasm of unspecified site: Secondary | ICD-10-CM | POA: Diagnosis present

## 2017-12-03 DIAGNOSIS — D500 Iron deficiency anemia secondary to blood loss (chronic): Secondary | ICD-10-CM | POA: Diagnosis present

## 2017-12-03 DIAGNOSIS — T451X5A Adverse effect of antineoplastic and immunosuppressive drugs, initial encounter: Secondary | ICD-10-CM | POA: Diagnosis present

## 2017-12-03 DIAGNOSIS — E440 Moderate protein-calorie malnutrition: Secondary | ICD-10-CM | POA: Diagnosis present

## 2017-12-03 DIAGNOSIS — F40240 Claustrophobia: Secondary | ICD-10-CM | POA: Diagnosis present

## 2017-12-03 DIAGNOSIS — E872 Acidosis: Secondary | ICD-10-CM | POA: Diagnosis present

## 2017-12-03 DIAGNOSIS — R1314 Dysphagia, pharyngoesophageal phase: Secondary | ICD-10-CM | POA: Diagnosis present

## 2017-12-03 DIAGNOSIS — C186 Malignant neoplasm of descending colon: Secondary | ICD-10-CM | POA: Diagnosis present

## 2017-12-03 DIAGNOSIS — K449 Diaphragmatic hernia without obstruction or gangrene: Secondary | ICD-10-CM | POA: Diagnosis not present

## 2017-12-03 DIAGNOSIS — E8809 Other disorders of plasma-protein metabolism, not elsewhere classified: Secondary | ICD-10-CM | POA: Diagnosis present

## 2017-12-03 DIAGNOSIS — N183 Chronic kidney disease, stage 3 (moderate): Secondary | ICD-10-CM | POA: Diagnosis not present

## 2017-12-03 DIAGNOSIS — R34 Anuria and oliguria: Secondary | ICD-10-CM | POA: Diagnosis present

## 2017-12-03 DIAGNOSIS — G893 Neoplasm related pain (acute) (chronic): Secondary | ICD-10-CM | POA: Diagnosis present

## 2017-12-03 DIAGNOSIS — E785 Hyperlipidemia, unspecified: Secondary | ICD-10-CM | POA: Diagnosis present

## 2017-12-03 DIAGNOSIS — Q211 Atrial septal defect: Secondary | ICD-10-CM

## 2017-12-03 DIAGNOSIS — K2211 Ulcer of esophagus with bleeding: Secondary | ICD-10-CM | POA: Diagnosis present

## 2017-12-03 DIAGNOSIS — Z515 Encounter for palliative care: Secondary | ICD-10-CM | POA: Diagnosis not present

## 2017-12-03 DIAGNOSIS — C189 Malignant neoplasm of colon, unspecified: Secondary | ICD-10-CM | POA: Diagnosis not present

## 2017-12-03 DIAGNOSIS — N185 Chronic kidney disease, stage 5: Secondary | ICD-10-CM | POA: Diagnosis present

## 2017-12-03 DIAGNOSIS — D649 Anemia, unspecified: Secondary | ICD-10-CM | POA: Diagnosis not present

## 2017-12-03 DIAGNOSIS — K219 Gastro-esophageal reflux disease without esophagitis: Secondary | ICD-10-CM | POA: Diagnosis not present

## 2017-12-03 DIAGNOSIS — J9601 Acute respiratory failure with hypoxia: Secondary | ICD-10-CM | POA: Diagnosis not present

## 2017-12-03 DIAGNOSIS — Z7189 Other specified counseling: Secondary | ICD-10-CM | POA: Diagnosis not present

## 2017-12-03 DIAGNOSIS — D701 Agranulocytosis secondary to cancer chemotherapy: Secondary | ICD-10-CM | POA: Diagnosis present

## 2017-12-03 DIAGNOSIS — Z79890 Hormone replacement therapy (postmenopausal): Secondary | ICD-10-CM | POA: Diagnosis not present

## 2017-12-03 DIAGNOSIS — G620 Drug-induced polyneuropathy: Secondary | ICD-10-CM | POA: Diagnosis present

## 2017-12-03 DIAGNOSIS — Z6836 Body mass index (BMI) 36.0-36.9, adult: Secondary | ICD-10-CM

## 2017-12-03 DIAGNOSIS — D630 Anemia in neoplastic disease: Secondary | ICD-10-CM | POA: Diagnosis present

## 2017-12-03 DIAGNOSIS — N189 Chronic kidney disease, unspecified: Secondary | ICD-10-CM | POA: Diagnosis present

## 2017-12-03 DIAGNOSIS — Z880 Allergy status to penicillin: Secondary | ICD-10-CM | POA: Diagnosis not present

## 2017-12-03 DIAGNOSIS — Z86718 Personal history of other venous thrombosis and embolism: Secondary | ICD-10-CM

## 2017-12-03 DIAGNOSIS — K224 Dyskinesia of esophagus: Secondary | ICD-10-CM | POA: Diagnosis not present

## 2017-12-03 HISTORY — DX: Gastro-esophageal reflux disease without esophagitis: K21.9

## 2017-12-03 HISTORY — DX: Esophageal obstruction: K22.2

## 2017-12-03 LAB — CBC WITH DIFFERENTIAL/PLATELET
BASOS PCT: 0 %
Basophils Absolute: 0 10*3/uL (ref 0.0–0.1)
EOS ABS: 0 10*3/uL (ref 0.0–0.7)
EOS PCT: 0 %
HCT: 31.5 % — ABNORMAL LOW (ref 36.0–46.0)
Hemoglobin: 10 g/dL — ABNORMAL LOW (ref 12.0–15.0)
LYMPHS ABS: 1.1 10*3/uL (ref 0.7–4.0)
Lymphocytes Relative: 13 %
MCH: 27.4 pg (ref 26.0–34.0)
MCHC: 31.7 g/dL (ref 30.0–36.0)
MCV: 86.3 fL (ref 78.0–100.0)
MONO ABS: 0.6 10*3/uL (ref 0.1–1.0)
MONOS PCT: 8 %
Neutro Abs: 6.6 10*3/uL (ref 1.7–7.7)
Neutrophils Relative %: 79 %
Platelets: 316 10*3/uL (ref 150–400)
RBC: 3.65 MIL/uL — ABNORMAL LOW (ref 3.87–5.11)
RDW: 18.6 % — AB (ref 11.5–15.5)
WBC: 8.3 10*3/uL (ref 4.0–10.5)

## 2017-12-03 LAB — LIPASE, BLOOD: Lipase: 25 U/L (ref 11–51)

## 2017-12-03 LAB — COMPREHENSIVE METABOLIC PANEL
ALBUMIN: 2.2 g/dL — AB (ref 3.5–5.0)
ALT: 7 U/L — ABNORMAL LOW (ref 14–54)
ANION GAP: 13 (ref 5–15)
AST: 15 U/L (ref 15–41)
Alkaline Phosphatase: 84 U/L (ref 38–126)
BUN: 18 mg/dL (ref 6–20)
CALCIUM: 9.1 mg/dL (ref 8.9–10.3)
CO2: 18 mmol/L — AB (ref 22–32)
Chloride: 110 mmol/L (ref 101–111)
Creatinine, Ser: 3.33 mg/dL — ABNORMAL HIGH (ref 0.44–1.00)
GFR calc non Af Amer: 12 mL/min — ABNORMAL LOW (ref >60)
GFR, EST AFRICAN AMERICAN: 14 mL/min — AB (ref >60)
GLUCOSE: 115 mg/dL — AB (ref 65–99)
POTASSIUM: 3.5 mmol/L (ref 3.5–5.1)
SODIUM: 141 mmol/L (ref 135–145)
TOTAL PROTEIN: 5.7 g/dL — AB (ref 6.5–8.1)
Total Bilirubin: 0.4 mg/dL (ref 0.3–1.2)

## 2017-12-03 MED ORDER — IOPAMIDOL (ISOVUE-300) INJECTION 61%
INTRAVENOUS | Status: AC
  Filled 2017-12-03: qty 30

## 2017-12-03 MED ORDER — POTASSIUM CHLORIDE CRYS ER 20 MEQ PO TBCR
20.0000 meq | EXTENDED_RELEASE_TABLET | Freq: Every day | ORAL | Status: DC
  Filled 2017-12-03 (×2): qty 1

## 2017-12-03 MED ORDER — LIDOCAINE-PRILOCAINE 2.5-2.5 % EX CREA: TOPICAL_CREAM | CUTANEOUS | Status: DC | PRN

## 2017-12-03 MED ORDER — SODIUM CHLORIDE 0.9 % IV BOLUS
500.0000 mL | Freq: Once | INTRAVENOUS | Status: AC
  Administered 2017-12-03: 500 mL via INTRAVENOUS

## 2017-12-03 MED ORDER — ONDANSETRON HCL 4 MG/2ML IJ SOLN
4.0000 mg | Freq: Four times a day (QID) | INTRAMUSCULAR | Status: DC | PRN
  Administered 2017-12-04 – 2017-12-13 (×5): 4 mg via INTRAVENOUS
  Filled 2017-12-03 (×5): qty 2

## 2017-12-03 MED ORDER — METOCLOPRAMIDE HCL 5 MG/ML IJ SOLN
5.0000 mg | Freq: Once | INTRAMUSCULAR | Status: AC
  Administered 2017-12-03: 5 mg via INTRAVENOUS
  Filled 2017-12-03: qty 2

## 2017-12-03 MED ORDER — SODIUM CHLORIDE 0.9 % IV SOLN
INTRAVENOUS | Status: DC
  Administered 2017-12-04: 01:00:00 via INTRAVENOUS

## 2017-12-03 MED ORDER — LEVETIRACETAM 500 MG PO TABS: 500.0000 mg | ORAL_TABLET | Freq: Two times a day (BID) | ORAL | Status: DC

## 2017-12-03 MED ORDER — PROMETHAZINE HCL 25 MG/ML IJ SOLN
12.5000 mg | Freq: Once | INTRAMUSCULAR | Status: AC
  Administered 2017-12-03: 12.5 mg via INTRAVENOUS
  Filled 2017-12-03: qty 1

## 2017-12-03 MED ORDER — ONDANSETRON HCL 4 MG/2ML IJ SOLN
4.0000 mg | Freq: Once | INTRAMUSCULAR | Status: AC
  Administered 2017-12-03: 4 mg via INTRAVENOUS
  Filled 2017-12-03: qty 2

## 2017-12-03 MED ORDER — ONDANSETRON HCL 4 MG PO TABS: 4.0000 mg | ORAL_TABLET | Freq: Four times a day (QID) | ORAL | Status: DC | PRN

## 2017-12-03 MED ORDER — PROCHLORPERAZINE EDISYLATE 10 MG/2ML IJ SOLN
10.0000 mg | Freq: Four times a day (QID) | INTRAMUSCULAR | Status: DC | PRN
  Administered 2017-12-04 – 2017-12-13 (×5): 10 mg via INTRAVENOUS
  Filled 2017-12-03 (×7): qty 2

## 2017-12-03 MED ORDER — ACETAMINOPHEN 325 MG PO TABS: 650.0000 mg | ORAL_TABLET | Freq: Four times a day (QID) | ORAL | Status: DC | PRN

## 2017-12-03 MED ORDER — ACETAMINOPHEN 650 MG RE SUPP: 650.0000 mg | Freq: Four times a day (QID) | RECTAL | Status: DC | PRN

## 2017-12-03 MED ORDER — ENOXAPARIN SODIUM 80 MG/0.8ML ~~LOC~~ SOLN
80.0000 mg | Freq: Every day | SUBCUTANEOUS | Status: DC
  Filled 2017-12-03: qty 0.8

## 2017-12-03 MED ORDER — ALLOPURINOL 100 MG PO TABS
100.0000 mg | ORAL_TABLET | Freq: Every day | ORAL | Status: DC
  Filled 2017-12-03 (×2): qty 1

## 2017-12-03 MED ORDER — SODIUM CHLORIDE 0.9 % IV BOLUS
1000.0000 mL | Freq: Once | INTRAVENOUS | Status: AC
  Administered 2017-12-03: 1000 mL via INTRAVENOUS

## 2017-12-03 NOTE — ED Provider Notes (Signed)
[facility] EMERGENCY DEPARTMENT Provider Note   CSN: [id number] Arrival date & time: 12/03/17  1849     History   Chief Complaint Chief Complaint  Patient presents with  . Emesis    HPI Shawna Schneider is a 83 y.o. female.  Patient with known history of metastatic colon cancer, esophageal stricture --presents with persistent vomiting.  Patient has had nausea and occasional vomiting prior to today.  Today patient has had at least 4 episodes prompting emergency department visit.  She has gradually developed some abdominal discomfort over the past days to weeks.  She has ascites and abdominal distention from this.  This is not changed appreciably.  She has stool incontinence which is chronic.  No blood noted in the stool.  No urinary symptoms.  Patient has been having trouble swallowing due to the stricture, recent EGD performed.  She is able to swallow liquids.  She is due to follow-up with GI for possible dilation.  She has been unable to eat or drink anything today. Home meds have not been helping at home. The course is constant. Aggravating factors: none. Alleviating factors: none.       Past Medical History:  Diagnosis Date  . Cancer ([place])   . Hyperlipemia   . Hypertension   . Seizures ([place]) 11/01/2017   no hx before today  . Solitary kidney   . Stroke [facility]) 05/2017   pt received TPA and has no deficits    Patient Active Problem List   Diagnosis Date Noted  . Palliative care by specialist   . Pressure injury of skin 11/02/2017  . Malnutrition of moderate degree 11/02/2017  . Seizure ([place]) 11/01/2017  . Upper GI bleed 07/17/2017  . Adenocarcinoma carcinomatosis ([place]) 06/16/2017  . Goals of care, counseling/discussion 06/16/2017  . Metastatic cancer ([place])   . Acute deep vein thrombosis (DVT) of distal vein of both lower extremities (HCC)   . Gastrointestinal hemorrhage   . Malignant neoplasm of descending colon ([place])   . Acute ischemic stroke  ([place]) 05/24/2017    Past Surgical History:  Procedure Laterality Date  . COLONOSCOPY WITH PROPOFOL N/A 05/28/2017   Procedure: COLONOSCOPY WITH PROPOFOL;  Surgeon: Milus Banister, MD;  Location: [facility] ENDOSCOPY;  Service: Endoscopy;  Laterality: N/A;  . ESOPHAGOGASTRODUODENOSCOPY N/A 05/28/2017   Procedure: ESOPHAGOGASTRODUODENOSCOPY (EGD);  Surgeon: Milus Banister, MD;  Location: [facility] ENDOSCOPY;  Service: Endoscopy;  Laterality: N/A;  . IR FLUORO GUIDE PORT INSERTION RIGHT  06/23/2017  . IR US GUIDE VASC ACCESS RIGHT  06/23/2017  . KIDNEY SURGERY       OB History   None      Home Medications    Prior to Admission medications   Medication Sig Start Date End Date Taking? Authorizing Provider  allopurinol (ZYLOPRIM) 100 MG tablet Take 100 mg by mouth daily. 02/25/17   [provider]  dexamethasone (DECADRON) 4 MG tablet Take 4 mg twice a day for 3 days 10/20/17   Owens Shark, NP  enoxaparin (LOVENOX) 80 MG/0.8ML injection INJECT 0.8 ML UNDER THE SKIN ONCE DAILY 11/26/17   Ladell Pier, MD  HYDROcodone-acetaminophen (NORCO) 5-325 MG tablet Take 1 tablet by mouth every 6 (six) hours as needed for moderate pain. 11/08/17   Emokpae, Courage, MD  latanoprost (XALATAN) 0.005 % ophthalmic solution Place 1 drop into both eyes at bedtime.    [provider]  levETIRAcetam (KEPPRA) 500 MG tablet Take 1 tablet (500 mg total) by mouth  2 (two) times daily. 11/08/17   Roxan Hockey, MD  levothyroxine (SYNTHROID, LEVOTHROID) 75 MCG tablet Take 75 mcg by mouth daily. 04/16/17   [provider]  lidocaine-prilocaine (EMLA) cream Apply to port site one hour prior to use. Do not rub in. Cover with plastic. 06/16/17   Ladell Pier, MD  metoCLOPramide (REGLAN) 5 MG tablet Take 1 tablet (5 mg total) by mouth 3 (three) times daily before meals. 09/15/17   Ladell Pier, MD  ondansetron (ZOFRAN ODT) 4 MG disintegrating tablet Take 2 tablets (8 mg total) by mouth every 8 (eight)  hours as needed for nausea or vomiting. 11/23/17   Owens Shark, NP  potassium chloride SA (K-DUR,KLOR-CON) 20 MEQ tablet Take 1 tablet (20 mEq total) by mouth daily. 11/10/17   Ladell Pier, MD    Family History Family History  Problem Relation Age of Onset  . Hypertension Mother   . Hypertension Father     Social History Social History   Tobacco Use  . Smoking status: Never Smoker  . Smokeless tobacco: Never Used  Substance Use Topics  . Alcohol use: No  . Drug use: No     Allergies   Ceftriaxone and Penicillins   Review of Systems Review of Systems  Constitutional: Negative for fever.  HENT: Negative for rhinorrhea and sore throat.   Eyes: Negative for redness.  Respiratory: Negative for cough and shortness of breath.   Cardiovascular: Negative for chest pain and leg swelling.  Gastrointestinal: Positive for abdominal distention, diarrhea, nausea and vomiting. Negative for abdominal pain, blood in stool and constipation.  Genitourinary: Negative for dysuria.  Musculoskeletal: Negative for myalgias.  Skin: Negative for rash.  Neurological: Negative for headaches.     Physical Exam Updated Vital Signs BP 110/87 (BP Location: Right Arm)   Pulse (!) 122   Temp (!) 97.5 F (36.4 C) (Oral)   Resp 16   Ht 5\' 3"  (1.6 m)   Wt 77.6 kg (171 lb)   SpO2 99%   BMI 30.29 kg/m   Physical Exam  Constitutional: She appears well-developed and well-nourished.  HENT:  Head: Normocephalic and atraumatic.  Mouth/Throat: Mucous membranes are dry.  Eyes: Conjunctivae are normal. Right eye exhibits no discharge. Left eye exhibits no discharge.  Neck: Normal range of motion. Neck supple.  Cardiovascular: Normal rate, regular rhythm and normal heart sounds.  No murmur heard. HR 97 at time of exam  Pulmonary/Chest: Effort normal and breath sounds normal. No respiratory distress. She has no wheezes. She has no rales.  Abdominal: Soft. She exhibits distension. There is no  tenderness (mild epigastric tenderness). There is no rebound and no guarding.  Neurological: She is alert.  Skin: Skin is warm and dry.  Psychiatric: She has a normal mood and affect.  Nursing note and vitals reviewed.    ED Treatments / Results  Labs (all labs ordered are listed, but only abnormal results are displayed) Labs Reviewed  CBC WITH DIFFERENTIAL/PLATELET - Abnormal; Notable for the following components:      Result Value   RBC 3.65 (*)    Hemoglobin 10.0 (*)    HCT 31.5 (*)    RDW 18.6 (*)    All other components within normal limits  COMPREHENSIVE METABOLIC PANEL - Abnormal; Notable for the following components:   CO2 18 (*)    Glucose, Bld 115 (*)    Creatinine, Ser 3.33 (*)    Total Protein 5.7 (*)    Albumin 2.2 (*)  ALT 7 (*)    GFR calc non Af Amer 12 (*)    GFR calc Af Amer 14 (*)    All other components within normal limits  LIPASE, BLOOD  URINALYSIS, ROUTINE W REFLEX MICROSCOPIC    EKG None  Radiology Dg Abd Acute W/chest  Result Date: 12/03/2017 CLINICAL DATA:  Vomiting with history of colon cancer EXAM: DG ABDOMEN ACUTE W/ 1V CHEST COMPARISON:  Chest CT 11/04/2017 FINDINGS: Accessed, power injectable right chest wall Port-A-Cath follows a right internal jugular vein approach with tip in the lower SVC. There are multiple bilateral pulmonary nodules. Right hemidiaphragm is elevated. No pneumothorax or pleural effusion. There is retained contrast material in the colon. No dilated small bowel. No free intraperitoneal air. IMPRESSION: 1. Multiple pulmonary metastases, as previously demonstrated by chest CT. 2. Retained enteric contrast within the colon. No evidence of small-bowel obstruction. Electronically Signed   By: Ulyses Jarred M.D.   On: 12/03/2017 21:24    Procedures Procedures (including critical care time)  Medications Ordered in ED Medications  iopamidol (ISOVUE-300) 61 % injection (has no administration in time range)  sodium chloride  0.9 % bolus 500 mL (has no administration in time range)  0.9 %  sodium chloride infusion (has no administration in time range)  metoCLOPramide (REGLAN) injection 5 mg (has no administration in time range)  ondansetron (ZOFRAN) injection 4 mg (4 mg Intravenous Given 12/03/17 2029)  sodium chloride 0.9 % bolus 1,000 mL (0 mLs Intravenous Stopped 12/03/17 2130)  promethazine (PHENERGAN) injection 12.5 mg (12.5 mg Intravenous Given 12/03/17 2200)  sodium chloride 0.9 % bolus 500 mL (0 mLs Intravenous Stopped 12/03/17 2246)     Initial Impression / Assessment and Plan / ED Course  I have reviewed the triage vital signs and the nursing notes.  Pertinent labs & imaging results that were available during my care of the patient were reviewed by me and considered in my medical decision making (see chart for details).     Patient seen and examined. Work-up initiated. Medications ordered.   Vital signs reviewed and are as follows: BP 110/87 (BP Location: Right Arm)   Pulse (!) 122   Temp (!) 97.5 F (36.4 C) (Oral)   Resp 16   Ht 5\' 3"  (1.6 m)   Wt 77.6 kg (171 lb)   SpO2 99%   BMI 30.29 kg/m   Discussed with Dr. Leonette Monarch who will see.  10:52 PM Spoke with Dr. Tamala Julian who will admit.    BP (!) 127/103   Pulse (!) 133   Temp (!) 97.5 F (36.4 C) (Oral)   Resp (!) 21   Ht 5\' 3"  (1.6 m)   Wt 77.6 kg (171 lb)   SpO2 100%   BMI 30.29 kg/m    Final Clinical Impressions(s) / ED Diagnoses   Final diagnoses:  Acute kidney injury (HCC)  Intractable vomiting with nausea, unspecified vomiting type  Metastatic colon cancer in female [facility])   Admit.   ED Discharge Orders    None       Carlisle Cater, Hershal Coria 12/03/17 2252    Fatima Blank, MD 12/04/17 1106

## 2017-12-03 NOTE — Telephone Encounter (Signed)
Dr Ardis Hughs please review for possible EGD per DR Benay Spice. See recent office note.

## 2017-12-03 NOTE — H&P (Signed)
History and Physical    SHIRLEE WHITMIRE YSA:[id number] DOB: Dec 17, 1934 DOA: 12/03/2017  Referring MD/NP/PA: Charlean Merl, PA-C PCP: [name], MD  Patient coming from: via EMS  Chief Complaint: Nausea and vomiting  I have personally briefly reviewed patient's old medical records in [place]   HPI: Shawna Schneider is a 83 y.o. female with medical history significant of renal cell carcinoma s/p nephrectomy, HTN, stroke, DVT on lovenox (eliquis d/c after GI bleeding), esophageal stricture, and current metastatic adenocarcinoma of the colon on chemotherapy; who resents with complaints of nausea and vomiting over the last 24 to 48 hours.  Patient reports having nothing the drink in the last 2 days. Emesis is noted to be dark brown to black in appearance.   Associated symptoms include stool incontinence (chronic) last bowel movement 2 days ago, constant epigastric abdominal pain, worsening abdominal distention, shortness of breath with vomiting.  Due to nausea and vomiting symptoms patient's been unable to keep any significant amount of her medications down either.  She has known esophageal strictures for which she was recommended to follow-up with gastroenterology for possible dilation.  She had just recently been admitted to the hospital from 3/25-4/1 for status epilepticus requiring intubation and placement on critical care service.  Patient was not noted to have any clear signs of brain metastases at that time but was started on Keppra.  She was also noted to have a aspiration pneumonia for which she was treated with Levaquin.  Patient had last chemo therapy treatment sometime at the end of March.  Following her hospitalization patient was transitioned to   ED Course: Upon admission into the emergency department patient was noted to be afebrile, pulse 133, respirations 16-25, blood pressures maintained, and O2 saturation 93 to 100% on RA.  Labs revealed , WBC 8.3, hemoglobin 10, CO2  18, BUN 18, and creatinine 3.33,  glucose 115, and albumin 2.2  Review of Systems  Constitutional: Positive for malaise/fatigue.  HENT: Negative for tinnitus.   Eyes: Negative for photophobia and pain.  Respiratory: Positive for cough and shortness of breath.   Cardiovascular: Positive for leg swelling. Negative for chest pain.  Gastrointestinal: Positive for abdominal pain, nausea and vomiting.       Positive for hematemesis  Genitourinary: Positive for urgency.  Musculoskeletal: Negative for falls and myalgias.  Neurological: Positive for weakness. Negative for loss of consciousness.  Psychiatric/Behavioral: Negative for substance abuse.    Past Medical History:  Diagnosis Date  . Cancer ([place])   . Hyperlipemia   . Hypertension   . Seizures ([place]) 11/01/2017   no hx before today  . Solitary kidney   . Stroke [facility]) 05/2017   pt received TPA and has no deficits    Past Surgical History:  Procedure Laterality Date  . COLONOSCOPY WITH PROPOFOL N/A 05/28/2017   Procedure: COLONOSCOPY WITH PROPOFOL;  Surgeon: Milus Banister, MD;  Location: [facility] ENDOSCOPY;  Service: Endoscopy;  Laterality: N/A;  . ESOPHAGOGASTRODUODENOSCOPY N/A 05/28/2017   Procedure: ESOPHAGOGASTRODUODENOSCOPY (EGD);  Surgeon: Milus Banister, MD;  Location: [facility] ENDOSCOPY;  Service: Endoscopy;  Laterality: N/A;  . IR FLUORO GUIDE PORT INSERTION RIGHT  06/23/2017  . IR US GUIDE VASC ACCESS RIGHT  06/23/2017  . KIDNEY SURGERY       reports that she has never smoked. She has never used smokeless tobacco. She reports that she does not drink alcohol or use drugs.  Allergies  Allergen Reactions  . Ceftriaxone Swelling    Lip  swelling  . Penicillins Hives    Has patient had a PCN reaction causing immediate rash, facial/tongue/throat swelling, SOB or lightheadedness with hypotension: yes Has patient had a PCN reaction causing severe rash involving mucus membranes or skin necrosis: unkn Has patient had a PCN reaction  that required hospitalization: yes Has patient had a PCN reaction occurring within the last 10 years: no If all of the above answers are "NO", then may proceed with Cephalosporin use.     Family History  Problem Relation Age of Onset  . Hypertension Mother   . Hypertension Father     Prior to Admission medications   Medication Sig Start Date End Date Taking? Authorizing Provider  allopurinol (ZYLOPRIM) 100 MG tablet Take 100 mg by mouth daily. 02/25/17  Yes [provider]  enoxaparin (LOVENOX) 80 MG/0.8ML injection INJECT 0.8 ML UNDER THE SKIN ONCE DAILY 11/26/17  Yes Ladell Pier, MD  HYDROcodone-acetaminophen (NORCO) 5-325 MG tablet Take 1 tablet by mouth every 6 (six) hours as needed for moderate pain. 11/08/17  Yes Emokpae, Courage, MD  latanoprost (XALATAN) 0.005 % ophthalmic solution Place 1 drop into both eyes at bedtime.   Yes [provider]  levETIRAcetam (KEPPRA) 500 MG tablet Take 1 tablet (500 mg total) by mouth 2 (two) times daily. 11/08/17  Yes Emokpae, Courage, MD  levothyroxine (SYNTHROID, LEVOTHROID) 75 MCG tablet Take 75 mcg by mouth daily. 04/16/17  Yes [provider]  lidocaine-prilocaine (EMLA) cream Apply to port site one hour prior to use. Do not rub in. Cover with plastic. 06/16/17  Yes Ladell Pier, MD  ondansetron (ZOFRAN ODT) 4 MG disintegrating tablet Take 2 tablets (8 mg total) by mouth every 8 (eight) hours as needed for nausea or vomiting. 11/23/17  Yes Owens Shark, NP  ondansetron (ZOFRAN) 8 MG tablet Take 8 mg by mouth every 8 (eight) hours as needed for nausea or vomiting.   Yes [provider]  potassium chloride SA (K-DUR,KLOR-CON) 20 MEQ tablet Take 1 tablet (20 mEq total) by mouth daily. 11/10/17  Yes Ladell Pier, MD  prochlorperazine (COMPAZINE) 5 MG tablet Take 5 mg by mouth every 6 (six) hours as needed for nausea or vomiting.   Yes [provider]    Physical Exam:  Constitutional: Chronically  ill appearing elderly lady who appears to be in moderate distress Vitals:   12/03/17 2015 12/03/17 2045 12/03/17 2130 12/03/17 2145  BP: 104/83 106/79 116/86 (!) 117/92  Pulse: (!) 123 (!) 118 (!) 119 (!) 116  Resp:  (!) 22 (!) 25 (!) 22  Temp:      TempSrc:      SpO2: 97% 97% 96% 93%  Weight:      Height:       Eyes: PERRL, lids and conjunctivae normal ENMT: Mucous membranes are dry. Posterior pharynx clear of any exudate or lesions. Dark brown vomitus present at the corner of patient's mouth Neck: normal, supple, no masses, no thyromegaly Respiratory: clear to auscultation bilaterally, no wheezing, no crackles. Normal respiratory effort. No accessory muscle use.  Cardiovascular: Regular rate and rhythm, no murmurs / rubs / gallops. No extremity edema. 2+ pedal pulses. No carotid bruits.  Abdomen: Abdominal distention with positive fluid wave tenderness to palpation epigastrically. Musculoskeletal: no clubbing / cyanosis. No joint deformity upper and lower extremities. Good ROM, no contractures. Normal muscle tone.  Skin: no rashes, lesions, ulcers. No induration Neurologic: CN 2-12 grossly intact. Sensation intact, DTR normal. Strength 5/5 in all  4.  Psychiatric: Normal judgment and insight. Alert and oriented x 3. Normal mood.     Labs on Admission: I have personally reviewed following labs and imaging studies  CBC: Recent Labs  Lab 12/03/17 2018  WBC 8.3  NEUTROABS 6.6  HGB 10.0*  HCT 31.5*  MCV 86.3  PLT 448   Basic Metabolic Panel: Recent Labs  Lab 12/03/17 2018  NA 141  K 3.5  CL 110  CO2 18*  GLUCOSE 115*  BUN 18  CREATININE 3.33*  CALCIUM 9.1   GFR: Estimated Creatinine Clearance: 13.3 mL/min (A) (by C-G formula based on SCr of 3.33 mg/dL (H)). Liver Function Tests: Recent Labs  Lab 12/03/17 2018  AST 15  ALT 7*  ALKPHOS 84  BILITOT 0.4  PROT 5.7*  ALBUMIN 2.2*   Recent Labs  Lab 12/03/17 2018  LIPASE 25   No results for input(s): AMMONIA  in the last 168 hours. Coagulation Profile: No results for input(s): INR, PROTIME in the last 168 hours. Cardiac Enzymes: No results for input(s): CKTOTAL, CKMB, CKMBINDEX, TROPONINI in the last 168 hours. BNP (last 3 results) No results for input(s): PROBNP in the last 8760 hours. HbA1C: No results for input(s): HGBA1C in the last 72 hours. CBG: No results for input(s): GLUCAP in the last 168 hours. Lipid Profile: No results for input(s): CHOL, HDL, LDLCALC, TRIG, CHOLHDL, LDLDIRECT in the last 72 hours. Thyroid Function Tests: No results for input(s): TSH, T4TOTAL, FREET4, T3FREE, THYROIDAB in the last 72 hours. Anemia Panel: No results for input(s): VITAMINB12, FOLATE, FERRITIN, TIBC, IRON, RETICCTPCT in the last 72 hours. Urine analysis:    Component Value Date/Time   COLORURINE YELLOW 11/01/2017 1940   APPEARANCEUR CLOUDY (A) 11/01/2017 1940   LABSPEC 1.016 11/01/2017 1940   PHURINE 5.0 11/01/2017 1940   GLUCOSEU NEGATIVE 11/01/2017 1940   HGBUR MODERATE (A) 11/01/2017 1940   BILIRUBINUR NEGATIVE 11/01/2017 1940   KETONESUR NEGATIVE 11/01/2017 1940   PROTEINUR 100 (A) 11/01/2017 1940   NITRITE NEGATIVE 11/01/2017 1940   LEUKOCYTESUR NEGATIVE 11/01/2017 1940   Sepsis Labs: No results found for this or any previous visit (from the past 240 hour(s)).   Radiological Exams on Admission: Dg Abd Acute W/chest  Result Date: 12/03/2017 CLINICAL DATA:  Vomiting with history of colon cancer EXAM: DG ABDOMEN ACUTE W/ 1V CHEST COMPARISON:  Chest CT 11/04/2017 FINDINGS: Accessed, power injectable right chest wall Port-A-Cath follows a right internal jugular vein approach with tip in the lower SVC. There are multiple bilateral pulmonary nodules. Right hemidiaphragm is elevated. No pneumothorax or pleural effusion. There is retained contrast material in the colon. No dilated small bowel. No free intraperitoneal air. IMPRESSION: 1. Multiple pulmonary metastases, as previously demonstrated  by chest CT. 2. Retained enteric contrast within the colon. No evidence of small-bowel obstruction. Electronically Signed   By: Ulyses Jarred M.D.   On: 12/03/2017 21:24     Assessment/Plan Intractable nausea and vomiting, abdominal pain: Patient presents with tractable nausea and vomiting.  Vomitus is dark brown with some signs of bright red blood.  Patient complains of progressively worsening abdominal distention and pain that previously was lower and now epigastric in nature.  Question possibility of acute obstruction versus gastritis/possible GI bleed vs other. - Admit to stepdown bed - IV fluids of normal saline at 112 mL/h as tolerated as tolerated - Monitor I&O's - Check gastric occult - Advance diet as tolerated - Antiemetics as needed - IV fentanyl as needed pain - Follow-up CT  abdomen/pelvis   Acute renal failure on chronic kidney disease: Patient's baseline creatinine previously noted to be around 1.7-2, but presents with creatinine 3.33 and BUN 18.  Question of possible obstruction. - IV fluids as seen above  - Follow-up CT scan - Place Foley catheter if seen to have urinary retention - Recheck BMP in a.m.  Dyspnea: Patient with acute risks of aspiration.  Just treated for aspiration pneumonia in the last hospitalization. - Aspiration precaution - Continuous pulse oximetry with nasal cannula oxygen as needed - DuoNeb's prn SOB/Wheezing  Metastatic colon cancer, on hospice care: Patient will have metastatic colon cancer that was no longer responding to chemotherapy and currently on hospice.   - Social work consult - Likely will need to counsel palliative care in a.m.  History of esophageal stricture - May warrant consult GI in a.m. question possible need of esophageal dilation  Anemia of chronic disease: Hemoglobin 10 on admission which appears improved from previous question hemoconcentration. - Recheck CBC in a.m.  Hypothyroidism - Continue levothyroxine when  able  Seizure disorder: Patient was not noted to have signs of brain metastases on imaging during last hospitalization. - Seizure precautions - Continue Keppra IV  Hypoalbuminemia    GI prophylaxis: Pepcid IV DVT prophylaxis: Discontinued Lovenox.  Placed on SCDs Code Status: Partial code okay for intubation/BiPAP only Family Communication: discussed plan of care with the patient and family at bedside Disposition Plan:  TBD Consults called: none Admission status: Inpatient  Norval Morton MD [facility] Pager [phone]   If 7PM-7AM, please contact night-coverage [email or web address] Password [facility]  12/03/2017, 10:28 PM

## 2017-12-03 NOTE — Telephone Encounter (Signed)
Spoke with son Shawna Schneider and informed him of same info.  Shawna Schneider voiced understanding.

## 2017-12-03 NOTE — ED Notes (Signed)
Pt had a small amount of vomitting while this RN and tech were attempting to get her cleaned up

## 2017-12-03 NOTE — ED Triage Notes (Signed)
Pt arrived via EMS for N/V/D and abd pain for three days, worse today.  Pt is on Hospice and has a current Colon CA.  Pt stopped chemo due to illness secondary to Chemo, about 3 weeks ago.  Pt denies fever.  Pt reports chronic diarrhea with the colon CA.

## 2017-12-03 NOTE — ED Notes (Signed)
Patient transported to X-ray 

## 2017-12-03 NOTE — Telephone Encounter (Signed)
Received call back from [place], [place] @ Dr. Ardis Hughs' office informing nurse re:  Dr. Ardis Hughs is out of office this week.  Precious Bard will relay message to provider early Monday AM.

## 2017-12-04 ENCOUNTER — Other Ambulatory Visit: Payer: Self-pay

## 2017-12-04 DIAGNOSIS — R0600 Dyspnea, unspecified: Secondary | ICD-10-CM | POA: Diagnosis present

## 2017-12-04 DIAGNOSIS — N189 Chronic kidney disease, unspecified: Secondary | ICD-10-CM | POA: Diagnosis present

## 2017-12-04 DIAGNOSIS — N179 Acute kidney failure, unspecified: Secondary | ICD-10-CM | POA: Diagnosis present

## 2017-12-04 LAB — OCCULT BLOOD GASTRIC / DUODENUM (SPECIMEN CUP): Occult Blood, Gastric: NEGATIVE

## 2017-12-04 LAB — CBC
HEMATOCRIT: 29.2 % — AB (ref 36.0–46.0)
Hemoglobin: 9.4 g/dL — ABNORMAL LOW (ref 12.0–15.0)
MCH: 27.6 pg (ref 26.0–34.0)
MCHC: 32.2 g/dL (ref 30.0–36.0)
MCV: 85.9 fL (ref 78.0–100.0)
Platelets: 297 10*3/uL (ref 150–400)
RBC: 3.4 MIL/uL — ABNORMAL LOW (ref 3.87–5.11)
RDW: 18.7 % — AB (ref 11.5–15.5)
WBC: 21.6 10*3/uL — ABNORMAL HIGH (ref 4.0–10.5)

## 2017-12-04 LAB — URINALYSIS, ROUTINE W REFLEX MICROSCOPIC
BILIRUBIN URINE: NEGATIVE
Glucose, UA: NEGATIVE mg/dL
Ketones, ur: NEGATIVE mg/dL
LEUKOCYTES UA: NEGATIVE
Nitrite: NEGATIVE
Protein, ur: NEGATIVE mg/dL
SPECIFIC GRAVITY, URINE: 1.017 (ref 1.005–1.030)
pH: 5 (ref 5.0–8.0)

## 2017-12-04 LAB — BASIC METABOLIC PANEL
Anion gap: 10 (ref 5–15)
BUN: 22 mg/dL — ABNORMAL HIGH (ref 6–20)
CALCIUM: 8.6 mg/dL — AB (ref 8.9–10.3)
CO2: 20 mmol/L — AB (ref 22–32)
CREATININE: 3.25 mg/dL — AB (ref 0.44–1.00)
Chloride: 111 mmol/L (ref 101–111)
GFR calc Af Amer: 14 mL/min — ABNORMAL LOW (ref >60)
GFR calc non Af Amer: 12 mL/min — ABNORMAL LOW (ref >60)
GLUCOSE: 139 mg/dL — AB (ref 65–99)
Potassium: 3.6 mmol/L (ref 3.5–5.1)
Sodium: 141 mmol/L (ref 135–145)

## 2017-12-04 MED ORDER — LEVETIRACETAM IN NACL 500 MG/100ML IV SOLN
500.0000 mg | Freq: Two times a day (BID) | INTRAVENOUS | Status: DC
  Administered 2017-12-04 – 2017-12-13 (×19): 500 mg via INTRAVENOUS
  Filled 2017-12-04 (×21): qty 100

## 2017-12-04 MED ORDER — FAMOTIDINE IN NACL 20-0.9 MG/50ML-% IV SOLN
20.0000 mg | Freq: Two times a day (BID) | INTRAVENOUS | Status: DC
  Administered 2017-12-04 – 2017-12-05 (×4): 20 mg via INTRAVENOUS
  Filled 2017-12-04 (×4): qty 50

## 2017-12-04 MED ORDER — METOPROLOL TARTRATE 5 MG/5ML IV SOLN
5.0000 mg | INTRAVENOUS | Status: DC | PRN
  Administered 2017-12-04 – 2017-12-13 (×2): 5 mg via INTRAVENOUS
  Filled 2017-12-04 (×4): qty 5

## 2017-12-04 MED ORDER — IPRATROPIUM-ALBUTEROL 0.5-2.5 (3) MG/3ML IN SOLN
3.0000 mL | RESPIRATORY_TRACT | Status: DC | PRN
  Administered 2017-12-13: 3 mL via RESPIRATORY_TRACT
  Filled 2017-12-04 (×2): qty 3

## 2017-12-04 MED ORDER — FENTANYL CITRATE (PF) 100 MCG/2ML IJ SOLN
25.0000 ug | INTRAMUSCULAR | Status: DC | PRN
  Administered 2017-12-04 – 2017-12-13 (×16): 25 ug via INTRAVENOUS
  Filled 2017-12-04 (×16): qty 2

## 2017-12-04 NOTE — Progress Notes (Signed)
Patient vomited x 1 light green colored emesis. Patient given IV Zofran x 1. Awaiting effectiveness.

## 2017-12-04 NOTE — Progress Notes (Signed)
Patient Demographics:    Shawna Schneider, is a 83 y.o. female, DOB - Sep 01, 1934, QQP:[id number]  Admit date - 12/03/2017   Admitting Physician Norval Morton, MD  Outpatient Primary MD for the patient is [name], MD  LOS - 1   Chief Complaint  Patient presents with  . Emesis        Subjective:    Shawna Schneider today has no fevers, no further emesis,  No chest pain,  Daughter at bedside   Assessment  & Plan :    Principal Problem:   Intractable nausea and vomiting Active Problems:   Malignant neoplasm of descending colon (HCC)   Metastatic cancer (HCC)   Seizure (HCC)   Acute renal failure superimposed on chronic kidney disease (HCC)   Dyspnea  Brief Summary 83 y.o. female with medical history significant of renal cell carcinoma s/p nephrectomy, HTN, stroke, DVT on lovenox (eliquis d/c after GI bleeding), esophageal stricture, and current metastatic adenocarcinoma of the colon on chemotherapy; who was admitted on 12/03/2017 with persistent nausea vomiting from home previously under hospice care prior to admission.  CT abdomen and pelvis suggest disease progression with new metastatic lesions in the pelvis and lungs, however no bowel obstruction there is concerns about hydronephrosis .   Plan:- 1)Stage IV Metastatic Colon Cancer-  abdominal pain nausea and vomiting is improved, CT abdomen without bowel obstruction, will attempt clear liquid diet and advance as tolerated, patient is not a candidate for further chemoradiation therapy Dr. Learta Codding her oncologist  2)Social/Ethics- pt, her  [name]  (son POA) and daughter Beverlee Nims a requested that patient be transferred to [facility] for symptom management/EOL care if she declines further.  Olivia Mackie from hospice services present.  Hospice input appreciated, on 12/04/17 bedside conference with Olivia Mackie from hospice services, patient's daughter Beverlee Nims and  patient's son/POA Therron  3)AKI----acute kidney injury on CKD stage III  -     creatinine on admission=3.33 ,   baseline creatinine = 1.8   , creatinine is now= 3.25     ,  Avoid nephrotoxic agents/dehydration/hypotension, may be related to hydronephrosis which may be related to ureteral obstruction from metastatic lesions  4)Seizure-continue Keppra, recent MRI of the brain without metastatic lesions  5) chronic anemia of chronic disease-secondary to underlying malignancy and prior chemotherapy, monitor H&H and transfuse as clinically indicated  6)H/o esophageal stricture-- ?? If to ill for EGD with dilatation  Code Status : Partial code   Disposition Plan  : Home with Hospice Vs [facility]  Consults  :  Palliative/Hospice   DVT Prophylaxis  :    SCDs    Lab Results  Component Value Date   PLT 297 12/04/2017    Inpatient Medications  Scheduled Meds: . allopurinol  100 mg Oral Daily  . potassium chloride SA  20 mEq Oral Daily   Continuous Infusions: . famotidine (PEPCID) IV Stopped (12/04/17 0946)  . levETIRAcetam Stopped (12/04/17 0946)   PRN Meds:.acetaminophen **OR** acetaminophen, fentaNYL (SUBLIMAZE) injection, ipratropium-albuterol, lidocaine-prilocaine, metoprolol tartrate, ondansetron **OR** ondansetron (ZOFRAN) IV, prochlorperazine    Anti-infectives (From admission, onward)   None        Objective:   Vitals:   12/04/17 0900 12/04/17 1000 12/04/17 1004 12/04/17 1651  BP:  120/87 104/80  Pulse: (!) 113  (!) 117   Resp: (!) 25 (!) 32 (!) 28   Temp:   98.9 F (37.2 C) 98.9 F (37.2 C)  TempSrc:   Oral Oral  SpO2: 100%  99%   Weight:      Height:        Wt Readings from Last 3 Encounters:  12/04/17 76.8 kg (169 lb 5 oz)  11/08/17 82.8 kg (182 lb 8.7 oz)  10/07/17 77.7 kg (171 lb 4 oz)    Intake/Output Summary (Last 24 hours) at 12/04/2017 1842 Last data filed at 12/04/2017 1800 Gross per 24 hour  Intake 3860 ml  Output 150 ml  Net 3710 ml       Physical Exam  Gen:- Awake Alert,  In no apparent distress  HEENT:- [place].AT, No sclera icterus Neck-Supple Neck,No JVD,.  Lungs-diminished in bases, no wheezing  CV- S1, S2 normal Abd-  +ve B.Sounds, Abd Soft, No tenderness, No CVA  Extremity/Skin:- warm, dry Psych-affect is appropriate, oriented x3 Neuro-no new focal deficits, no tremors   Data Review:   Micro Results No results found for this or any previous visit (from the past 240 hour(s)).  Radiology Reports Ct Abdomen Pelvis Wo Contrast  Result Date: 12/04/2017 CLINICAL DATA:  Nausea, vomiting, and abdominal pain for 3 days. History of colon cancer. Stop chemotherapy about 3 weeks ago. Abdominal distention. EXAM: CT ABDOMEN AND PELVIS WITHOUT CONTRAST TECHNIQUE: Multidetector CT imaging of the abdomen and pelvis was performed following the standard protocol without IV contrast. COMPARISON:  11/04/2017 FINDINGS: Lower chest: Bilateral pulmonary nodules measuring up to 2.4 cm diameter, likely pulmonary metastases. Since the previous study, there has been near complete resolution of pleural effusions and basilar atelectasis. Mild residual atelectasis in the right lung base. Moderately large esophageal hiatal hernia. Hepatobiliary: No focal liver lesions identified. Liver appears atrophic, possibly cirrhotic. Gallbladder is distended. No wall thickening, stone, or inflammatory infiltration. No bile duct dilatation. Pancreas: Unremarkable. No pancreatic ductal dilatation or surrounding inflammatory changes. Spleen: Normal in size without focal abnormality. Adrenals/Urinary Tract: Left kidney is absent, possibly congenital or postoperative. Multiple hyperdense lesions in the right kidney likely representing hemorrhagic cysts. Right hydronephrosis and hydroureter. Difficult to follow the ureter due to ascites. Cause of obstruction is not identified. This could represent obstruction or reflux. Appearance is new since prior study.  Stomach/Bowel: Stomach, small bowel, and colon are mostly decompressed. There is residual contrast material demonstrated throughout the colon. Multiple colonic diverticula with contrast filling. Vascular/Lymphatic: Calcification of the abdominal aorta. No aneurysm. Moderate prominent lymph nodes in the retroperitoneum, some with increased density. These may be metastatic. Reproductive: Limited visualization of the uterus. No obvious pelvic masses. Other: Diffuse abdominal and pelvic ascites with nodular infiltration of the mesentery and peritoneal implants consistent with diffuse peritoneal carcinomatosis. Small midline anterior abdominal wall hernias containing fluid and a small portion of the wall of small bowel. Soft tissue edema throughout the subcutaneous fat. No free air. Emphysema in the subcutaneous soft tissues of the anterior abdominal wall likely representing a site of injection. Musculoskeletal: Degenerative changes in the spine. Vague lucent lesions demonstrated in the right iliac bone and acetabulum may represent degenerative cysts or lucent metastasis. IMPRESSION: 1. Prominent diffuse abdominal and pelvic ascites with nodularity to the omentum and peritoneum consistent with peritoneal carcinomatosis. Similar to prior study. 2. Probable metastatic pulmonary nodules, retroperitoneal lymph nodes, and possible pelvic bone metastasis. 3. New right hydronephrosis and hydroureter of indeterminate etiology. No stones are  identified. 4. Hemorrhagic cysts in the right kidney. 5. Interval improvement of pleural effusions and atelectasis in the lung bases since previous study. 6. Aortic atherosclerosis. 7. Residual contrast material throughout the colon and in colonic diverticula. No evidence of obstruction. 8. Midline anterior abdominal wall hernias, 1 containing fluid and another containing a small portion of the wall of the small bowel loop. No obstruction. 9. Soft tissue edema throughout the subcutaneous fat.  Electronically Signed   By: Lucienne Capers M.D.   On: 12/04/2017 00:28   Dg Esophagus  Result Date: 12/01/2017 CLINICAL DATA:  Esophageal dysphagia. Vomiting. History of metastatic colon cancer. EXAM: ESOPHOGRAM/BARIUM SWALLOW TECHNIQUE: Single contrast examination was performed using  thin barium. FLUOROSCOPY TIME:  Fluoroscopy Time:  1 minutes and 36 seconds Radiation Exposure Index (if provided by the fluoroscopic device): 20.6 mGy Number of Acquired Spot Images: 0 COMPARISON:  Chest CT 11/04/2017 FINDINGS: Initial barium swallows demonstrate normal pharyngeal motion with swallowing. No laryngeal penetration or aspiration. No upper esophageal webs, strictures or diverticuli. Esophageal dysmotility with poor propagation of the primary peristaltic wave and frequent tertiary contractions/esophageal spasm. Distal esophageal stricture suspected just above the esophageal vestibule. Patient could not attempt to swallow the 13 mm barium pill because of vomiting. Small sliding-type hiatal hernia. IMPRESSION: 1. Esophageal dysmotility. 2. Suspect distal esophageal stricture just above the esophageal vestibule. Endoscopy may be helpful for further evaluation and treatment. 3. Small hiatal hernia. Electronically Signed   By: Marijo Sanes M.D.   On: 12/01/2017 14:44   Dg Abd Acute W/chest  Result Date: 12/03/2017 CLINICAL DATA:  Vomiting with history of colon cancer EXAM: DG ABDOMEN ACUTE W/ 1V CHEST COMPARISON:  Chest CT 11/04/2017 FINDINGS: Accessed, power injectable right chest wall Port-A-Cath follows a right internal jugular vein approach with tip in the lower SVC. There are multiple bilateral pulmonary nodules. Right hemidiaphragm is elevated. No pneumothorax or pleural effusion. There is retained contrast material in the colon. No dilated small bowel. No free intraperitoneal air. IMPRESSION: 1. Multiple pulmonary metastases, as previously demonstrated by chest CT. 2. Retained enteric contrast within the  colon. No evidence of small-bowel obstruction. Electronically Signed   By: Ulyses Jarred M.D.   On: 12/03/2017 21:24     CBC Recent Labs  Lab 12/03/17 2018 12/04/17 1122  WBC 8.3 21.6*  HGB 10.0* 9.4*  HCT 31.5* 29.2*  PLT 316 297  MCV 86.3 85.9  MCH 27.4 27.6  MCHC 31.7 32.2  RDW 18.6* 18.7*  LYMPHSABS 1.1  --   MONOABS 0.6  --   EOSABS 0.0  --   BASOSABS 0.0  --     Chemistries  Recent Labs  Lab 12/03/17 2018 12/04/17 1122  NA 141 141  K 3.5 3.6  CL 110 111  CO2 18* 20*  GLUCOSE 115* 139*  BUN 18 22*  CREATININE 3.33* 3.25*  CALCIUM 9.1 8.6*  AST 15  --   ALT 7*  --   ALKPHOS 84  --   BILITOT 0.4  --    ------------------------------------------------------------------------------------------------------------------ No results for input(s): CHOL, HDL, LDLCALC, TRIG, CHOLHDL, LDLDIRECT in the last 72 hours.  Lab Results  Component Value Date   HGBA1C 6.5 (H) 05/24/2017   ------------------------------------------------------------------------------------------------------------------ No results for input(s): TSH, T4TOTAL, T3FREE, THYROIDAB in the last 72 hours.  Invalid input(s): FREET3 ------------------------------------------------------------------------------------------------------------------ No results for input(s): VITAMINB12, FOLATE, FERRITIN, TIBC, IRON, RETICCTPCT in the last 72 hours.  Coagulation profile No results for input(s): INR, PROTIME in the last 168 hours.  No results for input(s): DDIMER in the last 72 hours.  Cardiac Enzymes No results for input(s): CKMB, TROPONINI, MYOGLOBIN in the last 168 hours.  Invalid input(s): CK ------------------------------------------------------------------------------------------------------------------ No results found for: BNP   Roxan Hockey M.D on 12/04/2017 at 6:42 PM  Between 7am to 7pm - Pager - [phone]  After 7pm go to [email or web address] - password [email or web address]  [facility] -   Office  [phone]   Voice Recognition Viviann Spare dictation system was used to create this note, attempts have been made to correct errors. Please contact the author with questions and/or clarifications.

## 2017-12-04 NOTE — Progress Notes (Signed)
Palliative Medicine Team consult was received.  Chart reviewed and also discussed with Margaretmary Eddy from [facility].  Please see her note dated today.  As goals seem clear following discussion by Dr. Denton Brick and [facility] liaison, will hold on formal consult at this time.  If there are needs or questions with which we can be of assistance, please call [phone].   Thank you for consulting our team to assist with this patients care.  Micheline Rough, MD [place] Palliative Medicine Team [phone]  NO CHARGE NOTE

## 2017-12-04 NOTE — Progress Notes (Signed)
[place] 4E-07 -- [facility]) GIP RN Visit at 1200  This is a related and covered GIP admission of 12/04/17 with [facility] diagnosis of malignant neoplasm of colon, per Dr. Karie Georges of [place].  Pt had been having vomiting for 3 days prior to admission. Pt felt Zofran made things worse so had not taken since Tuesday 11/30/17. Vomiting worsened 12/03/17. [facility] RN visited pt in the home, notified Dr. Karie Georges who requested patient be transferred for evaluation and rule out of obstruction.  Admission diagnosis: Intractable nausea and vomiting  Day 1 of GIP.  Met with patient, daughter [name], friend Jeneen Rinks, Dr. Denton Brick and bedside RN in the room. Diane called brother [name] on the phone and placed him on speaker. Dr. Denton Brick reviewed CT scan results with family specifically related to likely progression of disease but no obstruction noted. Pt has been NPO since arrival to the hospital. Decision was made to allow patient clear liquids and advance her diet as tolerated. If patient does not tolerate liquids or advancement of diet, Therron requested that patient be transferred to [facility] for symptom management/EOL care. Pt and daughter in agreement. Emotional support provided and questions answered.  Pt is currently receiving Pepcid 20 mg IV Q 12 hours and Keppra 500 mg/100 ml IV BID. PRN medications include Fentanyl 25 mcg Q 2 hours prn (pt received 3 doses since midnight), Metoprolol 5 mg IV prn 4 hours for HR > 120 (pt has received 1 dose at 0905), Zofran 4 mg IV q 6 hours prn (pt has received 1 dose at 0101), Compazine 10 mg IV Q hours prn (pt received 1 dose at 0031).  She is on nasal cannula at 2 lpm with O2 sats at 99%. Foley catheter in place draining clear yellow urine.   Goals of Care: Clarified code status with patient and son Education officer, community) -- limited code to include intubation and ventilator if needed for a temporary time, NO chest compressions and NO ACLS medications.    Discharge planning: as above. Will continue to follow while patient is hospitalized and anticipate discharge needs.  Communication to [facility]: as above  Communication to [facility]: Dr. Benay Spice, [facility] RN and [facility] CSW notified of patient admission.  Should patient need ambulance transport at time of discharge, please call [facility] as [facility] contracts with [facility] for our patients.  Current [facility] mediation list and Transfer summary placed on shadow chart.  Please call with any hospice related questions or concerns.  Thank you, Margaretmary Eddy, RN, [facility] Liaison [phone]  [place] are on AMION

## 2017-12-04 NOTE — ED Notes (Signed)
Pt noted to be tachycardic to 140s, hypoxic to 85% placed initially on 3L by [place] MD, increased to 5L w/ improvement of sat to 89%, increased to 7LPM w/ sats remaining at 90%. MD smith aware of oxygenation

## 2017-12-04 NOTE — Progress Notes (Signed)
Patient heart rate noted to be sustained over 130's throughout the night. E-page sent to on-call. Awaiting orders.

## 2017-12-05 LAB — BASIC METABOLIC PANEL
ANION GAP: 9 (ref 5–15)
BUN: 28 mg/dL — ABNORMAL HIGH (ref 6–20)
CALCIUM: 8.7 mg/dL — AB (ref 8.9–10.3)
CHLORIDE: 112 mmol/L — AB (ref 101–111)
CO2: 20 mmol/L — ABNORMAL LOW (ref 22–32)
Creatinine, Ser: 3.69 mg/dL — ABNORMAL HIGH (ref 0.44–1.00)
GFR calc non Af Amer: 11 mL/min — ABNORMAL LOW (ref >60)
GFR, EST AFRICAN AMERICAN: 12 mL/min — AB (ref >60)
Glucose, Bld: 117 mg/dL — ABNORMAL HIGH (ref 65–99)
Potassium: 3.6 mmol/L (ref 3.5–5.1)
SODIUM: 141 mmol/L (ref 135–145)

## 2017-12-05 LAB — CBC
HCT: 28.1 % — ABNORMAL LOW (ref 36.0–46.0)
HEMOGLOBIN: 9.2 g/dL — AB (ref 12.0–15.0)
MCH: 28.4 pg (ref 26.0–34.0)
MCHC: 32.7 g/dL (ref 30.0–36.0)
MCV: 86.7 fL (ref 78.0–100.0)
Platelets: 242 10*3/uL (ref 150–400)
RBC: 3.24 MIL/uL — ABNORMAL LOW (ref 3.87–5.11)
RDW: 18.9 % — AB (ref 11.5–15.5)
WBC: 16.9 10*3/uL — AB (ref 4.0–10.5)

## 2017-12-05 MED ORDER — ENOXAPARIN SODIUM 80 MG/0.8ML ~~LOC~~ SOLN: 80.0000 mg | SUBCUTANEOUS | Status: DC

## 2017-12-05 MED ORDER — HEPARIN SODIUM (PORCINE) 5000 UNIT/ML IJ SOLN
5000.0000 [IU] | Freq: Three times a day (TID) | INTRAMUSCULAR | Status: AC
  Administered 2017-12-05 (×2): 5000 [IU] via SUBCUTANEOUS
  Filled 2017-12-05 (×2): qty 1

## 2017-12-05 MED ORDER — FAMOTIDINE IN NACL 20-0.9 MG/50ML-% IV SOLN
20.0000 mg | INTRAVENOUS | Status: DC
  Administered 2017-12-06: 20 mg via INTRAVENOUS
  Filled 2017-12-05 (×2): qty 50

## 2017-12-05 MED ORDER — DEXTROSE-NACL 5-0.45 % IV SOLN
INTRAVENOUS | Status: DC
  Administered 2017-12-05 – 2017-12-07 (×6): via INTRAVENOUS

## 2017-12-05 NOTE — Progress Notes (Signed)
[place] 4E-07 -- Hospice and [facility]) RN note at 65  Spoke with Dr. Denton Brick regarding plan of care. Pt and family requesting GI evaluation for intractable nausea and vomiting as well as Urology and IR evaluation for possible nephrostomy tube placement. Discussed with Dr. Karie Georges of [facility] who advises we need to offer revocation.   I called son Hassan Buckler, to confirm that pt and family are requesting above. He confirms. I spoke with him about these treatments and offered him the opportunity to revoke the hospice benefit with the understanding that hospice services could be re-elected if and when the family wish after pursuing more aggressive therapies. Therron understood, all questions answered and support provided.   Katherina Right, [facility] LCSW to come and meet with Therron to sign revocation paperwork.  Please call with any hospice related questions or concerns.  Thank you, Margaretmary Eddy, RN, [facility] Liaison [phone]  [place] are on AMION.

## 2017-12-05 NOTE — Progress Notes (Addendum)
Patient vomited small amount of clear emesis, gown changed and sprite given by nursing staff. Will continue to monitor patient.Shawna Schneider, Bettina Gavia RN

## 2017-12-05 NOTE — Progress Notes (Signed)
[facility] Work note On-call LCSW communicated with hospital liaison RN, Olivia Mackie regarding family desire for further treatment which is out of scope of hospice care plan. Family are in agreement with revoking hospice benefit to pursue treatment. LCSW met with son/HCPPA, Flo Shanks at bedside and discussed revocation process. He signed documents and patient has revoked benefit effective today 12/05/17 to pursue treatments. Floor RN and Liaison RN, Olivia Mackie notified.  Katherina Right, [place]

## 2017-12-05 NOTE — Telephone Encounter (Signed)
Thanks.  SHe needs EGD with dilation in next 1-2 weeks.  WL May 9th Thursday preferable (MAC sedation).  For dysphagia, esophageal stricture.  Shawna Schneider

## 2017-12-05 NOTE — Consult Note (Signed)
Urology Consult   Physician requesting consult: Dr. Denton Brick  Reason for consult: Right hydronephrosis with solitary right kidney  History of Present Illness: Shawna Schneider is a 83 y.o. with metastatic colon cancer with peritoneal carcinomatosis.  She is not a candidate for further chemotherapy or active treatment.  She had recently entered hospice care.  About one week ago, she developed increasing abdominal pain with pain also across her back.  She then developed severe nausea and vomiting on Friday that has persisted.  She and her family rescinded hospice care this morning and now request active intervention for improvement of her symptoms. She apparently also a history of an esophageal stricture and is being seen by GI.  She has a history of a left nephrectomy for RCC by Dr. Diona Fanti about 15 years ago.  A CT scan on 4/27 indicated new right sided hydronephrosis without a clear cause of obstruction but with severe ascites and probable secondary to her advanced malignancy.  Her Cr is around 2.0 at baseline.  Her renal function has steadily worsened and her Cr is 3.69 today. No fevers or signs of systemic infection. She is oliguric with only about 150 cc over the last 24 hrs.   Past Medical History:  Diagnosis Date  . Cancer ([place])   . Hyperlipemia   . Hypertension   . Seizures ([place]) 11/01/2017   no hx before today  . Solitary kidney   . Stroke [facility]) 05/2017   pt received TPA and has no deficits    Past Surgical History:  Procedure Laterality Date  . COLONOSCOPY WITH PROPOFOL N/A 05/28/2017   Procedure: COLONOSCOPY WITH PROPOFOL;  Surgeon: Milus Banister, MD;  Location: [facility] ENDOSCOPY;  Service: Endoscopy;  Laterality: N/A;  . ESOPHAGOGASTRODUODENOSCOPY N/A 05/28/2017   Procedure: ESOPHAGOGASTRODUODENOSCOPY (EGD);  Surgeon: Milus Banister, MD;  Location: [facility] ENDOSCOPY;  Service: Endoscopy;  Laterality: N/A;  . IR FLUORO GUIDE PORT INSERTION RIGHT  06/23/2017  . IR US GUIDE VASC  ACCESS RIGHT  06/23/2017  . KIDNEY SURGERY       Current Hospital Medications:  Home meds:  Current Meds  Medication Sig  . allopurinol (ZYLOPRIM) 100 MG tablet Take 100 mg by mouth daily.  Marland Kitchen enoxaparin (LOVENOX) 80 MG/0.8ML injection INJECT 0.8 ML UNDER THE SKIN ONCE DAILY  . HYDROcodone-acetaminophen (NORCO) 5-325 MG tablet Take 1 tablet by mouth every 6 (six) hours as needed for moderate pain.  Marland Kitchen latanoprost (XALATAN) 0.005 % ophthalmic solution Place 1 drop into both eyes at bedtime.  . levETIRAcetam (KEPPRA) 500 MG tablet Take 1 tablet (500 mg total) by mouth 2 (two) times daily.  Marland Kitchen levothyroxine (SYNTHROID, LEVOTHROID) 75 MCG tablet Take 75 mcg by mouth daily.  Marland Kitchen lidocaine-prilocaine (EMLA) cream Apply to port site one hour prior to use. Do not rub in. Cover with plastic.  Marland Kitchen ondansetron (ZOFRAN ODT) 4 MG disintegrating tablet Take 2 tablets (8 mg total) by mouth every 8 (eight) hours as needed for nausea or vomiting.  . ondansetron (ZOFRAN) 8 MG tablet Take 8 mg by mouth every 8 (eight) hours as needed for nausea or vomiting.  . potassium chloride SA (K-DUR,KLOR-CON) 20 MEQ tablet Take 1 tablet (20 mEq total) by mouth daily.  . prochlorperazine (COMPAZINE) 5 MG tablet Take 5 mg by mouth every 6 (six) hours as needed for nausea or vomiting.    Scheduled Meds: . allopurinol  100 mg Oral Daily  . heparin injection (subcutaneous)  5,000 Units Subcutaneous Q8H  . potassium  chloride SA  20 mEq Oral Daily   Continuous Infusions: . dextrose 5 % and 0.45% NaCl 125 mL/hr at 12/05/17 1701  . [START ON 12/06/2017] famotidine (PEPCID) IV    . levETIRAcetam Stopped (12/05/17 0912)   PRN Meds:.acetaminophen **OR** acetaminophen, fentaNYL (SUBLIMAZE) injection, ipratropium-albuterol, lidocaine-prilocaine, metoprolol tartrate, ondansetron **OR** ondansetron (ZOFRAN) IV, prochlorperazine  Allergies:  Allergies  Allergen Reactions  . Ceftriaxone Swelling    Lip swelling  . Penicillins Hives     Has patient had a PCN reaction causing immediate rash, facial/tongue/throat swelling, SOB or lightheadedness with hypotension: yes Has patient had a PCN reaction causing severe rash involving mucus membranes or skin necrosis: unkn Has patient had a PCN reaction that required hospitalization: yes Has patient had a PCN reaction occurring within the last 10 years: no If all of the above answers are "NO", then may proceed with Cephalosporin use.     Family History  Problem Relation Age of Onset  . Hypertension Mother   . Hypertension Father     Social History:  reports that she has never smoked. She has never used smokeless tobacco. She reports that she does not drink alcohol or use drugs.  ROS: A complete review of systems was performed.  All systems are negative except for pertinent findings as noted.  Physical Exam:  Vital signs in last 24 hours: Temp:  [96.5 F (35.8 C)-98.4 F (36.9 C)] 96.5 F (35.8 C) (04/28 1659) Pulse Rate:  [88-117] 96 (04/28 1659) Resp:  [19-27] 20 (04/28 1232) BP: (97-124)/(68-101) 124/82 (04/28 1659) SpO2:  [84 %-100 %] 100 % (04/28 1659) Constitutional:  Alert and oriented, No acute distress Cardiovascular: Regular rate and rhythm, No JVD Respiratory: Normal respiratory effort, Lungs clear bilaterally GI: Abdomen is distended.  No masses. No tenderness on palpation. GU: No CVA tenderness Lymphatic: No cervical or inguinal lymphadenopathy Neurologic: Grossly intact, no focal deficits Psychiatric: Normal mood and affect  Laboratory Data:  Recent Labs    12/03/17 2018 12/04/17 1122 12/05/17 0903  WBC 8.3 21.6* 16.9*  HGB 10.0* 9.4* 9.2*  HCT 31.5* 29.2* 28.1*  PLT 316 297 242    Recent Labs    12/03/17 2018 12/04/17 1122 12/05/17 0903  NA 141 141 141  K 3.5 3.6 3.6  CL 110 111 112*  GLUCOSE 115* 139* 117*  BUN 18 22* 28*  CALCIUM 9.1 8.6* 8.7*  CREATININE 3.33* 3.25* 3.69*     Results for orders placed or performed during  the hospital encounter of 12/03/17 (from the past 24 hour(s))  CBC     Status: Abnormal   Collection Time: 12/05/17  9:03 AM  Result Value Ref Range   WBC 16.9 (H) 4.0 - 10.5 K/uL   RBC 3.24 (L) 3.87 - 5.11 MIL/uL   Hemoglobin 9.2 (L) 12.0 - 15.0 g/dL   HCT 28.1 (L) 36.0 - 46.0 %   MCV 86.7 78.0 - 100.0 fL   MCH 28.4 26.0 - 34.0 pg   MCHC 32.7 30.0 - 36.0 g/dL   RDW 18.9 (H) 11.5 - 15.5 %   Platelets 242 150 - 400 K/uL  Basic metabolic panel     Status: Abnormal   Collection Time: 12/05/17  9:03 AM  Result Value Ref Range   Sodium 141 135 - 145 mmol/L   Potassium 3.6 3.5 - 5.1 mmol/L   Chloride 112 (H) 101 - 111 mmol/L   CO2 20 (L) 22 - 32 mmol/L   Glucose, Bld 117 (H) 65 - 99 mg/dL  BUN 28 (H) 6 - 20 mg/dL   Creatinine, Ser 3.69 (H) 0.44 - 1.00 mg/dL   Calcium 8.7 (L) 8.9 - 10.3 mg/dL   GFR calc non Af Amer 11 (L) >60 mL/min   GFR calc Af Amer 12 (L) >60 mL/min   Anion gap 9 5 - 15   No results found for this or any previous visit (from the past 240 hour(s)).  Renal Function: Recent Labs    12/03/17 2018 12/04/17 1122 12/05/17 0903  CREATININE 3.33* 3.25* 3.69*   Estimated Creatinine Clearance: 11.9 mL/min (A) (by C-G formula based on SCr of 3.69 mg/dL (H)).  Radiologic Imaging: Ct Abdomen Pelvis Wo Contrast  Result Date: 12/04/2017 CLINICAL DATA:  Nausea, vomiting, and abdominal pain for 3 days. History of colon cancer. Stop chemotherapy about 3 weeks ago. Abdominal distention. EXAM: CT ABDOMEN AND PELVIS WITHOUT CONTRAST TECHNIQUE: Multidetector CT imaging of the abdomen and pelvis was performed following the standard protocol without IV contrast. COMPARISON:  11/04/2017 FINDINGS: Lower chest: Bilateral pulmonary nodules measuring up to 2.4 cm diameter, likely pulmonary metastases. Since the previous study, there has been near complete resolution of pleural effusions and basilar atelectasis. Mild residual atelectasis in the right lung base. Moderately large esophageal  hiatal hernia. Hepatobiliary: No focal liver lesions identified. Liver appears atrophic, possibly cirrhotic. Gallbladder is distended. No wall thickening, stone, or inflammatory infiltration. No bile duct dilatation. Pancreas: Unremarkable. No pancreatic ductal dilatation or surrounding inflammatory changes. Spleen: Normal in size without focal abnormality. Adrenals/Urinary Tract: Left kidney is absent, possibly congenital or postoperative. Multiple hyperdense lesions in the right kidney likely representing hemorrhagic cysts. Right hydronephrosis and hydroureter. Difficult to follow the ureter due to ascites. Cause of obstruction is not identified. This could represent obstruction or reflux. Appearance is new since prior study. Stomach/Bowel: Stomach, small bowel, and colon are mostly decompressed. There is residual contrast material demonstrated throughout the colon. Multiple colonic diverticula with contrast filling. Vascular/Lymphatic: Calcification of the abdominal aorta. No aneurysm. Moderate prominent lymph nodes in the retroperitoneum, some with increased density. These may be metastatic. Reproductive: Limited visualization of the uterus. No obvious pelvic masses. Other: Diffuse abdominal and pelvic ascites with nodular infiltration of the mesentery and peritoneal implants consistent with diffuse peritoneal carcinomatosis. Small midline anterior abdominal wall hernias containing fluid and a small portion of the wall of small bowel. Soft tissue edema throughout the subcutaneous fat. No free air. Emphysema in the subcutaneous soft tissues of the anterior abdominal wall likely representing a site of injection. Musculoskeletal: Degenerative changes in the spine. Vague lucent lesions demonstrated in the right iliac bone and acetabulum may represent degenerative cysts or lucent metastasis. IMPRESSION: 1. Prominent diffuse abdominal and pelvic ascites with nodularity to the omentum and peritoneum consistent with  peritoneal carcinomatosis. Similar to prior study. 2. Probable metastatic pulmonary nodules, retroperitoneal lymph nodes, and possible pelvic bone metastasis. 3. New right hydronephrosis and hydroureter of indeterminate etiology. No stones are identified. 4. Hemorrhagic cysts in the right kidney. 5. Interval improvement of pleural effusions and atelectasis in the lung bases since previous study. 6. Aortic atherosclerosis. 7. Residual contrast material throughout the colon and in colonic diverticula. No evidence of obstruction. 8. Midline anterior abdominal wall hernias, 1 containing fluid and another containing a small portion of the wall of the small bowel loop. No obstruction. 9. Soft tissue edema throughout the subcutaneous fat. Electronically Signed   By: Lucienne Capers M.D.   On: 12/04/2017 00:28   Dg Abd Acute W/chest  Result  Date: 12/03/2017 CLINICAL DATA:  Vomiting with history of colon cancer EXAM: DG ABDOMEN ACUTE W/ 1V CHEST COMPARISON:  Chest CT 11/04/2017 FINDINGS: Accessed, power injectable right chest wall Port-A-Cath follows a right internal jugular vein approach with tip in the lower SVC. There are multiple bilateral pulmonary nodules. Right hemidiaphragm is elevated. No pneumothorax or pleural effusion. There is retained contrast material in the colon. No dilated small bowel. No free intraperitoneal air. IMPRESSION: 1. Multiple pulmonary metastases, as previously demonstrated by chest CT. 2. Retained enteric contrast within the colon. No evidence of small-bowel obstruction. Electronically Signed   By: Ulyses Jarred M.D.   On: 12/03/2017 21:24    I independently reviewed the above imaging studies.  Impression/Recommendation: 1) Right hydronephrosis of solitary right kidney with AKI with h/o CKD: We discussed options in the context of her overall prognosis, which is poor.  We discussed that her AKI is likely related to obstruction of her solitary kidney and would likely improve with  drainage.  We also discussed the possibility that her N/V and pain may improve with renal drainage as these symptoms may be consistent with ureteral obstruction although may be related to other etiologies as well such as her malignancy and esophageal stricture.  We discussed the option of right nephrostomy tube placement by IR as an option for intervention and reviewed the potential risks of this procedure.  Finally, we discussed the option of no intervention and the natural history of her situation that would likely result in progressive renal failure and death.  We discussed that death from renal failure is usually painless.  After discussion, she and her family wish to proceed with right nephrostomy tube placement.  I would recommend an interventional radiology consultation on Monday morning.  I will notify Dr. Diona Fanti of her admission.  Nastassja Witkop,LES 12/05/2017, 5:08 PM  Pryor Curia. MD   CC: Dr. Denton Brick

## 2017-12-05 NOTE — Progress Notes (Signed)
Patient Demographics:    Shawna Schneider, is a 83 y.o. female, DOB - [birth year], YWV:[id number]  Admit date - 12/03/2017   Admitting Physician Norval Morton, MD  Outpatient Primary MD for the patient is [name], MD  LOS - 2   Chief Complaint  Patient presents with  . Emesis        Subjective:    Shawna Schneider today has no fevers, no further emesis,  No chest pain, son/POA and  Daughter at bedside , requiring IV fentanyl for pain control  Assessment  & Plan :    Principal Problem:   Intractable nausea and vomiting Active Problems:   Malignant neoplasm of descending colon (HCC)   Metastatic cancer (HCC)   Seizure (HCC)   Acute renal failure superimposed on chronic kidney disease (HCC)   Dyspnea  Brief Summary 83 y.o. female with medical history significant of renal cell carcinoma s/p nephrectomy, HTN, stroke, DVT on lovenox (eliquis d/c after GI bleeding), esophageal stricture, and current metastatic adenocarcinoma of the colon on chemotherapy; who was admitted on 12/03/2017 with persistent nausea vomiting from home previously under hospice care prior to admission.  CT abdomen and pelvis suggest disease progression with new metastatic lesions in the pelvis and lungs, however no bowel obstruction there is concerns about hydronephrosis .  Patient and family has rescinded hospice as of 12/05/2017.. They want full scope of treatment including GI consult for possible EGD with dilatation due to esophageal stricture, as well as neurology and interventional Radiology consult for possible right nephrostomy placement for right-sided hydronephrosis and hydroureter   Plan:- 1)Stage IV Metastatic Colon Cancer-  abdominal pain nausea and vomiting is improved, CT abdomen without bowel obstruction, will attempt clear liquid diet and advance as tolerated, patient is not a candidate for further chemoradiation  therapy Dr. Learta Codding her oncologist  2)Social/Ethics-  Patient and family Shawna Schneider  (son POA) and daughter Shawna Schneider) has rescinded hospice as of 12/05/2017.. They want full scope of treatment including GI consult for possible EGD with dilatation due to esophageal stricture, as well as urology and interventional radiology consult for possible right nephrostomy placement for right-sided hydronephrosis and hydroureter.  Olivia Mackie from hospice services notified.  Hospice input appreciated  3)AKI----acute kidney injury on CKD stage III  -     creatinine on admission=3.33 ,   baseline creatinine = 1.8   , creatinine is now= 3.69  ,  Avoid nephrotoxic agents/dehydration/hypotension, most likely related to added hydronephrosis and hydroureter which may be related to ureteral obstruction from metastatic lesions  4)Seizure-continue Keppra, recent MRI of the brain without metastatic lesions  5) chronic anemia of chronic disease-secondary to underlying malignancy and prior chemotherapy, monitor H&H and transfuse as clinically indicated  6)H/o esophageal stricture--patient and family requesting EGD with dilatation, GI consult for possible EGD with dilatation due to esophageal stricture, discussed with Dr. Hilarie Fredrickson, avoid therapeutic doses of anticoagulants at this time, n.p.o. after midnight  7)H/o DVT- avoid therapeutic doses of anticoagulants at this time to allow for possible EGD with dilatation and possible right nephrostomy tube placement  8) right-sided hydronephrosis and hydroureter with rising creatinine- urology and interventional radiology consult for possible right nephrostomy placement for right-sided hydronephrosis and hydroureter, discussed with on-call urologist Dr. Alinda Money  Code  Status : Partial code  Disposition Plan  : Patient and family Shawna Schneider  (son POA) and daughter Shawna Schneider) has rescinded hospice as of 12/05/2017.. They want full scope of treatment  Consults  :  Palliative/Hospice/Gi  Service/Urology/IR  DVT Prophylaxis  :    SCDs    Lab Results  Component Value Date   PLT 242 12/05/2017    Inpatient Medications  Scheduled Meds: . allopurinol  100 mg Oral Daily  . heparin injection (subcutaneous)  5,000 Units Subcutaneous Q8H  . potassium chloride SA  20 mEq Oral Daily   Continuous Infusions: . [START ON 12/06/2017] famotidine (PEPCID) IV    . levETIRAcetam Stopped (12/05/17 0912)   PRN Meds:.acetaminophen **OR** acetaminophen, fentaNYL (SUBLIMAZE) injection, ipratropium-albuterol, lidocaine-prilocaine, metoprolol tartrate, ondansetron **OR** ondansetron (ZOFRAN) IV, prochlorperazine    Anti-infectives (From admission, onward)   None        Objective:   Vitals:   12/05/17 0432 12/05/17 0734 12/05/17 1217 12/05/17 1232  BP: 97/68 116/84 110/90 (!) 117/101  Pulse: (!) 117   99  Resp: 19 (!) 25 (!) 24 20  Temp: 98.4 F (36.9 C) 97.6 F (36.4 C) 97.6 F (36.4 C) (!) 97.1 F (36.2 C)  TempSrc: Oral Oral Oral Oral  SpO2: 99%  100% (!) 84%  Weight:      Height:        Wt Readings from Last 3 Encounters:  12/04/17 76.8 kg (169 lb 5 oz)  11/08/17 82.8 kg (182 lb 8.7 oz)  10/07/17 77.7 kg (171 lb 4 oz)    Intake/Output Summary (Last 24 hours) at 12/05/2017 1617 Last data filed at 12/05/2017 1325 Gross per 24 hour  Intake 480 ml  Output -  Net 480 ml    Physical Exam  Gen:- Awake Alert, chronically ill-appearing in no apparent distress  HEENT:- [place].AT, No sclera icterus Neck-Supple Neck,No JVD,.  Lungs-diminished in bases, no wheezing  CV- S1, S2 normal, right chest wall Port-A-Cath site is clean dry and intact Abd-  +ve B.Sounds, Abd Soft, No tenderness, No CVA tenderness Extremity/Skin:- warm, dry Psych-affect is appropriate, oriented x3 Neuro-no new focal deficits, no tremors   Data Review:   Micro Results No results found for this or any previous visit (from the past 240 hour(s)).  Radiology Reports Ct Abdomen Pelvis Wo  Contrast  Result Date: 12/04/2017 CLINICAL DATA:  Nausea, vomiting, and abdominal pain for 3 days. History of colon cancer. Stop chemotherapy about 3 weeks ago. Abdominal distention. EXAM: CT ABDOMEN AND PELVIS WITHOUT CONTRAST TECHNIQUE: Multidetector CT imaging of the abdomen and pelvis was performed following the standard protocol without IV contrast. COMPARISON:  11/04/2017 FINDINGS: Lower chest: Bilateral pulmonary nodules measuring up to 2.4 cm diameter, likely pulmonary metastases. Since the previous study, there has been near complete resolution of pleural effusions and basilar atelectasis. Mild residual atelectasis in the right lung base. Moderately large esophageal hiatal hernia. Hepatobiliary: No focal liver lesions identified. Liver appears atrophic, possibly cirrhotic. Gallbladder is distended. No wall thickening, stone, or inflammatory infiltration. No bile duct dilatation. Pancreas: Unremarkable. No pancreatic ductal dilatation or surrounding inflammatory changes. Spleen: Normal in size without focal abnormality. Adrenals/Urinary Tract: Left kidney is absent, possibly congenital or postoperative. Multiple hyperdense lesions in the right kidney likely representing hemorrhagic cysts. Right hydronephrosis and hydroureter. Difficult to follow the ureter due to ascites. Cause of obstruction is not identified. This could represent obstruction or reflux. Appearance is new since prior study. Stomach/Bowel: Stomach, small bowel, and colon are mostly decompressed. There  is residual contrast material demonstrated throughout the colon. Multiple colonic diverticula with contrast filling. Vascular/Lymphatic: Calcification of the abdominal aorta. No aneurysm. Moderate prominent lymph nodes in the retroperitoneum, some with increased density. These may be metastatic. Reproductive: Limited visualization of the uterus. No obvious pelvic masses. Other: Diffuse abdominal and pelvic ascites with nodular infiltration of  the mesentery and peritoneal implants consistent with diffuse peritoneal carcinomatosis. Small midline anterior abdominal wall hernias containing fluid and a small portion of the wall of small bowel. Soft tissue edema throughout the subcutaneous fat. No free air. Emphysema in the subcutaneous soft tissues of the anterior abdominal wall likely representing a site of injection. Musculoskeletal: Degenerative changes in the spine. Vague lucent lesions demonstrated in the right iliac bone and acetabulum may represent degenerative cysts or lucent metastasis. IMPRESSION: 1. Prominent diffuse abdominal and pelvic ascites with nodularity to the omentum and peritoneum consistent with peritoneal carcinomatosis. Similar to prior study. 2. Probable metastatic pulmonary nodules, retroperitoneal lymph nodes, and possible pelvic bone metastasis. 3. New right hydronephrosis and hydroureter of indeterminate etiology. No stones are identified. 4. Hemorrhagic cysts in the right kidney. 5. Interval improvement of pleural effusions and atelectasis in the lung bases since previous study. 6. Aortic atherosclerosis. 7. Residual contrast material throughout the colon and in colonic diverticula. No evidence of obstruction. 8. Midline anterior abdominal wall hernias, 1 containing fluid and another containing a small portion of the wall of the small bowel loop. No obstruction. 9. Soft tissue edema throughout the subcutaneous fat. Electronically Signed   By: Lucienne Capers M.D.   On: 12/04/2017 00:28   Dg Esophagus  Result Date: 12/01/2017 CLINICAL DATA:  Esophageal dysphagia. Vomiting. History of metastatic colon cancer. EXAM: ESOPHOGRAM/BARIUM SWALLOW TECHNIQUE: Single contrast examination was performed using  thin barium. FLUOROSCOPY TIME:  Fluoroscopy Time:  1 minutes and 36 seconds Radiation Exposure Index (if provided by the fluoroscopic device): 20.6 mGy Number of Acquired Spot Images: 0 COMPARISON:  Chest CT 11/04/2017 FINDINGS:  Initial barium swallows demonstrate normal pharyngeal motion with swallowing. No laryngeal penetration or aspiration. No upper esophageal webs, strictures or diverticuli. Esophageal dysmotility with poor propagation of the primary peristaltic wave and frequent tertiary contractions/esophageal spasm. Distal esophageal stricture suspected just above the esophageal vestibule. Patient could not attempt to swallow the 13 mm barium pill because of vomiting. Small sliding-type hiatal hernia. IMPRESSION: 1. Esophageal dysmotility. 2. Suspect distal esophageal stricture just above the esophageal vestibule. Endoscopy may be helpful for further evaluation and treatment. 3. Small hiatal hernia. Electronically Signed   By: Marijo Sanes M.D.   On: 12/01/2017 14:44   Dg Abd Acute W/chest  Result Date: 12/03/2017 CLINICAL DATA:  Vomiting with history of colon cancer EXAM: DG ABDOMEN ACUTE W/ 1V CHEST COMPARISON:  Chest CT 11/04/2017 FINDINGS: Accessed, power injectable right chest wall Port-A-Cath follows a right internal jugular vein approach with tip in the lower SVC. There are multiple bilateral pulmonary nodules. Right hemidiaphragm is elevated. No pneumothorax or pleural effusion. There is retained contrast material in the colon. No dilated small bowel. No free intraperitoneal air. IMPRESSION: 1. Multiple pulmonary metastases, as previously demonstrated by chest CT. 2. Retained enteric contrast within the colon. No evidence of small-bowel obstruction. Electronically Signed   By: Ulyses Jarred M.D.   On: 12/03/2017 21:24     CBC Recent Labs  Lab 12/03/17 2018 12/04/17 1122 12/05/17 0903  WBC 8.3 21.6* 16.9*  HGB 10.0* 9.4* 9.2*  HCT 31.5* 29.2* 28.1*  PLT 316 297 242  MCV 86.3 85.9 86.7  MCH 27.4 27.6 28.4  MCHC 31.7 32.2 32.7  RDW 18.6* 18.7* 18.9*  LYMPHSABS 1.1  --   --   MONOABS 0.6  --   --   EOSABS 0.0  --   --   BASOSABS 0.0  --   --     Chemistries  Recent Labs  Lab 12/03/17 2018  12/04/17 1122 12/05/17 0903  NA 141 141 141  K 3.5 3.6 3.6  CL 110 111 112*  CO2 18* 20* 20*  GLUCOSE 115* 139* 117*  BUN 18 22* 28*  CREATININE 3.33* 3.25* 3.69*  CALCIUM 9.1 8.6* 8.7*  AST 15  --   --   ALT 7*  --   --   ALKPHOS 84  --   --   BILITOT 0.4  --   --    ------------------------------------------------------------------------------------------------------------------ No results for input(s): CHOL, HDL, LDLCALC, TRIG, CHOLHDL, LDLDIRECT in the last 72 hours.  Lab Results  Component Value Date   HGBA1C 6.5 (H) 05/24/2017   ------------------------------------------------------------------------------------------------------------------ No results for input(s): TSH, T4TOTAL, T3FREE, THYROIDAB in the last 72 hours.  Invalid input(s): FREET3 ------------------------------------------------------------------------------------------------------------------ No results for input(s): VITAMINB12, FOLATE, FERRITIN, TIBC, IRON, RETICCTPCT in the last 72 hours.  Coagulation profile No results for input(s): INR, PROTIME in the last 168 hours.  No results for input(s): DDIMER in the last 72 hours.  Cardiac Enzymes No results for input(s): CKMB, TROPONINI, MYOGLOBIN in the last 168 hours.  Invalid input(s): CK ------------------------------------------------------------------------------------------------------------------ No results found for: BNP   Roxan Hockey M.D on 12/05/2017 at 4:17 PM  Between 7am to 7pm - Pager - [phone]  After 7pm go to [email or web address] - password [email or web address]  [facility] -  Office  [phone]   Voice Recognition Viviann Spare dictation system was used to create this note, attempts have been made to correct errors. Please contact the author with questions and/or clarifications.

## 2017-12-05 NOTE — Progress Notes (Signed)
Family with concerns/ questions about Lovenox being discontinued. Dr. Denton Brick made aware by nursing staff and orders received. Will continue to monitor patient. Skylynne Schlechter, Bettina Gavia RN

## 2017-12-06 ENCOUNTER — Encounter (HOSPITAL_COMMUNITY): Payer: Self-pay | Admitting: Internal Medicine

## 2017-12-06 ENCOUNTER — Inpatient Hospital Stay (HOSPITAL_COMMUNITY): Payer: BC Managed Care – PPO

## 2017-12-06 DIAGNOSIS — Z515 Encounter for palliative care: Secondary | ICD-10-CM

## 2017-12-06 DIAGNOSIS — Z7189 Other specified counseling: Secondary | ICD-10-CM

## 2017-12-06 DIAGNOSIS — K449 Diaphragmatic hernia without obstruction or gangrene: Secondary | ICD-10-CM

## 2017-12-06 DIAGNOSIS — R1310 Dysphagia, unspecified: Secondary | ICD-10-CM

## 2017-12-06 DIAGNOSIS — R1319 Other dysphagia: Secondary | ICD-10-CM

## 2017-12-06 DIAGNOSIS — K224 Dyskinesia of esophagus: Secondary | ICD-10-CM

## 2017-12-06 HISTORY — PX: IR NEPHROSTOMY PLACEMENT RIGHT: IMG6064

## 2017-12-06 LAB — BASIC METABOLIC PANEL
Anion gap: 8 (ref 5–15)
BUN: 29 mg/dL — ABNORMAL HIGH (ref 6–20)
CO2: 20 mmol/L — ABNORMAL LOW (ref 22–32)
CREATININE: 3.71 mg/dL — AB (ref 0.44–1.00)
Calcium: 8.5 mg/dL — ABNORMAL LOW (ref 8.9–10.3)
Chloride: 111 mmol/L (ref 101–111)
GFR, EST AFRICAN AMERICAN: 12 mL/min — AB (ref >60)
GFR, EST NON AFRICAN AMERICAN: 11 mL/min — AB (ref >60)
Glucose, Bld: 98 mg/dL (ref 65–99)
POTASSIUM: 3.5 mmol/L (ref 3.5–5.1)
SODIUM: 139 mmol/L (ref 135–145)

## 2017-12-06 LAB — PROTIME-INR
INR: 1.08
Prothrombin Time: 13.9 seconds (ref 11.4–15.2)

## 2017-12-06 MED ORDER — FENTANYL CITRATE (PF) 100 MCG/2ML IJ SOLN
INTRAMUSCULAR | Status: AC
Start: 2017-12-06 — End: 2017-12-07
  Filled 2017-12-06: qty 2

## 2017-12-06 MED ORDER — FENTANYL CITRATE (PF) 100 MCG/2ML IJ SOLN
INTRAMUSCULAR | Status: AC | PRN
  Administered 2017-12-06 (×2): 25 ug via INTRAVENOUS

## 2017-12-06 MED ORDER — LIDOCAINE HCL (PF) 1 % IJ SOLN
INTRAMUSCULAR | Status: AC | PRN
  Administered 2017-12-06: 10 mL

## 2017-12-06 MED ORDER — MIDAZOLAM HCL 2 MG/2ML IJ SOLN
INTRAMUSCULAR | Status: AC
  Filled 2017-12-06: qty 4

## 2017-12-06 MED ORDER — MIDAZOLAM HCL 2 MG/2ML IJ SOLN
INTRAMUSCULAR | Status: AC | PRN
  Administered 2017-12-06: 1 mg via INTRAVENOUS

## 2017-12-06 MED ORDER — CIPROFLOXACIN IN D5W 400 MG/200ML IV SOLN
INTRAVENOUS | Status: AC
  Administered 2017-12-06: 400 mg via INTRAVENOUS
  Filled 2017-12-06: qty 200

## 2017-12-06 MED ORDER — LIDOCAINE HCL 1 % IJ SOLN
INTRAMUSCULAR | Status: AC
  Filled 2017-12-06: qty 20

## 2017-12-06 MED ORDER — IOPAMIDOL (ISOVUE-300) INJECTION 61%
INTRAVENOUS | Status: AC
  Administered 2017-12-06: 15 mL
  Filled 2017-12-06: qty 50

## 2017-12-06 MED ORDER — CIPROFLOXACIN IN D5W 400 MG/200ML IV SOLN
400.0000 mg | Freq: Once | INTRAVENOUS | Status: AC
Start: 2017-12-06 — End: 2017-12-06
  Administered 2017-12-06: 400 mg via INTRAVENOUS

## 2017-12-06 NOTE — Consult Note (Signed)
Consultation Note Date: 12/07/2017   Patient Name: Shawna Schneider  DOB: 10-30-1934  MRN: [id number]  Age / Sex: 83 y.o., female  PCP: [name], MD Referring Physician: Roxan Hockey, MD  Reason for Consultation: Establishing goals of care  HPI/Patient Profile: 83 y.o. female  with past medical history of renal cell carcinoma s/p nephrectomy 2006, metastatic colon ca last chemo 10/27/17, HTN, CKD, DVT, HLD, hypothyroidism, multiple strokes who had been on service with [facility].  She continues to have episodes of dysphagia and choking as well as having AKI likely related to obstructive process.  Patient has revoked hospice benefits.  PMT consulted for GOC  Clinical Assessment and Goals of Care: I have reviewed medical records including EPIC notes, labs and imaging, received report from bedside RN, assessed the patient and then met at the bedside along with patient's son TJ  to discuss diagnosis prognosis, GOC, EOL wishes, disposition and options.  I introduced Palliative Medicine as specialized medical care for people living with serious illness. It focuses on providing relief from the symptoms and stress of a serious illness. The goal is to improve quality of life for both the patient and the family.  TJ reports being familiar with our team from prior admission.  He states that his mother has been on hospice services at home and they have been satisfied with services.  At this point, however, patient and family have decided to revoke hospice services to possibly pursue esophageal dilitation and nephrostomy tube placement.  We discussed their hopes with this and son is clear that they feel that this is something that will improve quality of life and are secure in their decision to pursue.  Questions and concerns were addressed. The family was encouraged to call with questions or concerns.   Primary Decision Maker PATIENT   SUMMARY OF  RECOMMENDATIONS   - Patient was on hospice services on admission.  She has since revoked hospice services. - I discussed with patient and her son.  She has revoked hospice as she is interested in evaluation for possible esophageal dilitation and nephrostomy tube.  Await input from GI and IR. - Her son reports that they have been happy with hospice services and may reenroll at some point in the future. - I am going off service but will ask another member of [facility] to follow-up with family later this week.   Code Status/Advance Care Planning:  Limited code - no CPR/defib   Symptom Management:   Per primary - denies pain  Palliative Prophylaxis:   Aspiration, Delirium Protocol and Frequent Pain Assessment  Additional Recommendations (Limitations, Scope, Preferences):  No CPR/defib but would want intubation if indicated  Psycho-social/Spiritual:   Desire for further Chaplaincy support: No  Additional Recommendations: Education on Hospice  Prognosis:   < 6 months and she would be appropriate to restart hospice services if so desired  Discharge Planning: To Be Determined     Primary Diagnoses: Present on Admission: . Intractable nausea and vomiting . Malignant neoplasm of descending colon ([place]) . Metastatic cancer ([place]) . Acute renal failure superimposed on chronic kidney disease ([place]) . Dyspnea   I have reviewed the medical record, interviewed the patient and family, and examined the patient. The following aspects are pertinent.  Past Medical History:  Diagnosis Date  . Cancer ([place])   . Hyperlipemia   . Hypertension   . Seizures ([place]) 11/01/2017   no hx before today  . Solitary kidney   . Stroke [facility]) 05/2017  pt received TPA and has no deficits   Social History   Socioeconomic History  . Marital status: Divorced    Spouse name: Not on file  . Number of children: Not on file  . Years of education: Not on file  . Highest education level: Not on file    Occupational History  . Not on file  Social Needs  . Financial resource strain: Not on file  . Food insecurity:    Worry: Not on file    Inability: Not on file  . Transportation needs:    Medical: Not on file    Non-medical: Not on file  Tobacco Use  . Smoking status: Never Smoker  . Smokeless tobacco: Never Used  Substance and Sexual Activity  . Alcohol use: No  . Drug use: No  . Sexual activity: Not Currently  Lifestyle  . Physical activity:    Days per week: Not on file    Minutes per session: Not on file  . Stress: Not on file  Relationships  . Social connections:    Talks on phone: Not on file    Gets together: Not on file    Attends religious service: Not on file    Active member of club or organization: Not on file    Attends meetings of clubs or organizations: Not on file    Relationship status: Not on file  Other Topics Concern  . Not on file  Social History Narrative   Divorced, retired from [employer] system   + children   No EtOH or tobacco   Family History  Problem Relation Age of Onset  . Hypertension Mother   . Hypertension Father    Scheduled Meds: . allopurinol  100 mg Oral Daily  . potassium chloride SA  20 mEq Oral Daily   Continuous Infusions: . dextrose 5 % and 0.45% NaCl 125 mL/hr at 12/07/17 0327  . famotidine (PEPCID) IV Stopped (12/06/17 1037)  . levETIRAcetam Stopped (12/06/17 2144)   PRN Meds:.acetaminophen **OR** acetaminophen, fentaNYL (SUBLIMAZE) injection, ipratropium-albuterol, lidocaine-prilocaine, metoprolol tartrate, ondansetron **OR** ondansetron (ZOFRAN) IV, prochlorperazine Allergies  Allergen Reactions  . Ceftriaxone Swelling    Lip swelling  . Penicillins Hives    Has patient had a PCN reaction causing immediate rash, facial/tongue/throat swelling, SOB or lightheadedness with hypotension: yes Has patient had a PCN reaction causing severe rash involving mucus membranes or skin necrosis: unkn Has patient had a PCN reaction  that required hospitalization: yes Has patient had a PCN reaction occurring within the last 10 years: no If all of the above answers are "NO", then may proceed with Cephalosporin use.    Review of Systems  Constitutional: Positive for activity change.       Denies pain  Respiratory: Negative for shortness of breath and wheezing.   Cardiovascular: Negative for chest pain and leg swelling.  Gastrointestinal: Negative for nausea and vomiting.  Neurological: Negative.     Physical Exam  Constitutional: She is oriented to person, place, and time. No distress.  HENT:  Head: Normocephalic and atraumatic.  Cardiovascular: Normal rate and regular rhythm.  Pulmonary/Chest: Effort normal. No respiratory distress.  Abdominal: Soft. There is no tenderness.  Neurological: She is alert and oriented to person, place, and time.  Skin: Skin is warm and dry.  Psychiatric: She has a normal mood and affect. Her speech is normal and behavior is normal.    Vital Signs: BP 109/68 (BP Location: Right Arm)   Pulse 90   Temp 99  F (37.2 C) (Oral)   Resp (!) 24   Ht 5' 3"  (1.6 m)   Wt 81.9 kg (180 lb 8.9 oz)   SpO2 96%   BMI 31.98 kg/m  Pain Scale: 0-10   Pain Score: 0-No pain   SpO2: SpO2: 96 % O2 Device:SpO2: 96 % O2 Flow Rate: .O2 Flow Rate (L/min): 2 L/min  IO: Intake/output summary:   Intake/Output Summary (Last 24 hours) at 12/07/2017 0907 Last data filed at 12/07/2017 2957 Gross per 24 hour  Intake 3385.42 ml  Output 375 ml  Net 3010.42 ml    LBM: Last BM Date: 11/30/17 Baseline Weight: Weight: 77.6 kg (171 lb) Most recent weight: Weight: 81.9 kg (180 lb 8.9 oz)     Palliative Assessment/Data: PPS 60%     Time In: 1400 Time Out: 1500 Time Total: 60 minutes Greater than 50%  of this time was spent counseling and coordinating care related to the above assessment and plan.  Micheline Rough, MD [place] Team [phone]

## 2017-12-06 NOTE — Consult Note (Addendum)
[facility] Consult: 11:42 AM 12/06/2017  LOS: 3 days    Referring Provider: Dr Hulen Shouts  Primary Care Physician:  [name], MD Primary Gastroenterologist:  Dr. Ardis Hughs     Reason for Consultation:  Dysphagia.      IMPRESSION:   *    Dysphagia.  Suspect this is dysmotility rather than the esophageal ring causing the problem.  *  Metastatic cancer.  Colon mass on colonoscopy was TA without HGD.  Further studies revealed gastrointestinal or pancreatico/biliary primary. Ongoing carcinomatosis and likely malignant ascites despite chemo.    *   History DVT, Eliquis discontinued because of GI bleeding but started on Lovenox after diagnosis of recurrent DVT and PE in 08/2017.  Lovenox on hold currently in anticipation of endoscopic, cystoscopic intervention.  Last received Lovenox 4/26, receiving SQ Heparin instead, last given 2100 yesterday.    *  Chronic anemia.  Stable.  Platelets and coags normal.    *  Right hydronephrosis and hydroureter, oliguria with AKI, GFR 12, was 31 on 11/10/17 (stage 5 CKD).  Nephrostomy tube placement planned.      PLAN:     *  ? EGD.  Dr Carlean Purl will see pt and decide.  Could be done as soon as tomorrow.  Need to work timing with timing of nephrostomy tube placement (IR to place but have not yet seen pt, have they been consulted???).     [name]  12/06/2017, 11:42 AM Phone [place] Attending   I have taken an interval history, reviewed the chart and examined the patient. I agree with the Advanced Practitioner's note, impression and recommendations.    Dysphagia Nausea and vomiting/regurgitation Esophageal dysmotility Paraesophageal hiatal hernia Schatzki ring Metastatic colon cancer - on hospice now off to treat symptoms and  signs   Difficult problem - I have reviewed CT scans, and ba swallow and 2018 EGD. I showed esophagram to son. I believe paraesophageal hiatal hernia is the cause of dysphagia and am unlikely to be able to help her. That said - she did have a Schatzki ring (patent) in 2018 so disrupting that may help. Less likely but possible would be mediastinal metastases.  Apparently (per son) the ascites is gelatinous and cannot drain - reduction in ascites could possibly help though overall running out of good options.  She and family have desire to palliate and she is pretty miserable with dysphagia and regurgitation so will see what I can do.  EGD, ? Esophageal dilation hopefully tommorow. The risks and benefits as well as alternatives of endoscopic procedure(s) have been discussed and reviewed. All questions answered. The patient agrees to proceed. Son present.   Gatha Mayer, MD, [facility] 12/06/2017 3:20 PM     HPI: Shawna Schneider is a 83 y.o. female.   Hx cervical cancer age 20.   renal cell cancer, nephrectomy ~ 2003.  CVA 05/2017, treated with tPA.  Had LGIB and anemia requiring transfusion.   Metastatic cancer diagnosed 05/2017 with widespread mets at time of  diagnosis. 05/2017 Colonoscopy: partially obstructing soft tissue mass at descending colon.   Pathology: superficial fragments of TVA without HGD  On further path studies: gastrointestinal or pancreatico/biliary primary.  Dr Benay Spice treating with chemo: FOLFOX >> FOLFIRI.  Last chemo was end of March 2019.  Bil DVT 05/2017 at time of CVA and cancer diagnosis.  Was on Eliquis but it was discontinued after GIB in 07/2017 requiring transfusion.  Endoscopies not repeated, bleeding attributed to colon tumor.  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       Admitted 3/25 - 4/1 for status epilepticus.  No brain mets found on CT.  Treated for aspiration PNA.   Hospice pt starting 1 week ago but  hospice/DNR rescinded since admission 3 d ago.    Bowel incontinence, brown/loose BMs 1 - 2 per day.  LE edema.  Abdominal swelling from ascites for a few weeks.  Denies abdominal pain. Though admitting notes mentions epigastric abdominal pain, distention and vomiting of dark material..   For several months having solid and liquid dysphagia.  Progressive in recent weeks to where unable to swallow anything, regurgitates po back up.  Even spits out saliva.  Ongoing weight loss.   EGD 05/2017: tortuous esophagus.  Minor Schatskis ring at GEJ, large HH with ? Paraesophageal component.  Esophagram 4/24: esoph dysmotility, suspicion of stricture at distal esophagus, small HH.  Vomited during study, could not swallow 13 mm pill.    CT ab/pelvis without contrast 12/04/17: Diffuse, prominent ascites in the abdomen and pelvis with nodular omentum and peritoneum consistent with carcinomatosis.  Probable metastatic lesions in the lungs.  New right hydronephrosis and hydroureter.  Midline anterior abdominal wall hernias one containing fluid and another containing small portion of small bowel.  Urology met with pt and family and wish to have IR perform nephrostomy tube placement as was Dr Lynne Logan suggestion.      Past Medical History:  Diagnosis Date  . Cancer ([place])   . Hyperlipemia   . Hypertension   . Seizures ([place]) 11/01/2017   no hx before today  . Solitary kidney   . Stroke [facility]) 05/2017   pt received TPA and has no deficits    Past Surgical History:  Procedure Laterality Date  . COLONOSCOPY WITH PROPOFOL N/A 05/28/2017   Procedure: COLONOSCOPY WITH PROPOFOL;  Surgeon: Milus Banister, MD;  Location: [facility] ENDOSCOPY;  Service: Endoscopy;  Laterality: N/A;  . ESOPHAGOGASTRODUODENOSCOPY N/A 05/28/2017   Procedure: ESOPHAGOGASTRODUODENOSCOPY (EGD);  Surgeon: Milus Banister, MD;  Location: [facility] ENDOSCOPY;  Service: Endoscopy;  Laterality: N/A;  . IR FLUORO GUIDE PORT INSERTION RIGHT  06/23/2017  . IR  US GUIDE VASC ACCESS RIGHT  06/23/2017  . KIDNEY SURGERY      Prior to Admission medications   Medication Sig Start Date End Date Taking? Authorizing Provider  allopurinol (ZYLOPRIM) 100 MG tablet Take 100 mg by mouth daily. 02/25/17  Yes [provider]  enoxaparin (LOVENOX) 80 MG/0.8ML injection INJECT 0.8 ML UNDER THE SKIN ONCE DAILY 11/26/17  Yes Ladell Pier, MD  HYDROcodone-acetaminophen (NORCO) 5-325 MG tablet Take 1 tablet by mouth every 6 (six) hours as needed for moderate pain. 11/08/17  Yes Emokpae, Courage, MD  latanoprost (XALATAN) 0.005 % ophthalmic solution Place 1 drop into both eyes at bedtime.   Yes [provider]  levETIRAcetam (KEPPRA) 500 MG tablet Take 1 tablet (500 mg total) by mouth 2 (two) times daily. 11/08/17  Yes Roxan Hockey, MD  levothyroxine (SYNTHROID,  LEVOTHROID) 75 MCG tablet Take 75 mcg by mouth daily. 04/16/17  Yes [provider]  lidocaine-prilocaine (EMLA) cream Apply to port site one hour prior to use. Do not rub in. Cover with plastic. 06/16/17  Yes Ladell Pier, MD  ondansetron (ZOFRAN ODT) 4 MG disintegrating tablet Take 2 tablets (8 mg total) by mouth every 8 (eight) hours as needed for nausea or vomiting. 11/23/17  Yes Owens Shark, NP  ondansetron (ZOFRAN) 8 MG tablet Take 8 mg by mouth every 8 (eight) hours as needed for nausea or vomiting.   Yes [provider]  potassium chloride SA (K-DUR,KLOR-CON) 20 MEQ tablet Take 1 tablet (20 mEq total) by mouth daily. 11/10/17  Yes Ladell Pier, MD  prochlorperazine (COMPAZINE) 5 MG tablet Take 5 mg by mouth every 6 (six) hours as needed for nausea or vomiting.   Yes [provider]    Scheduled Meds: . allopurinol  100 mg Oral Daily  . potassium chloride SA  20 mEq Oral Daily   Infusions: . dextrose 5 % and 0.45% NaCl 125 mL/hr at 12/06/17 1007  . famotidine (PEPCID) IV 20 mg (12/06/17 1007)  . levETIRAcetam 500 mg (12/06/17 1011)   PRN  Meds: acetaminophen **OR** acetaminophen, fentaNYL (SUBLIMAZE) injection, ipratropium-albuterol, lidocaine-prilocaine, metoprolol tartrate, ondansetron **OR** ondansetron (ZOFRAN) IV, prochlorperazine   Allergies as of 12/03/2017 - Review Complete 12/03/2017  Allergen Reaction Noted  . Ceftriaxone Swelling 11/02/2017  . Penicillins Hives 05/06/2012    Family History  Problem Relation Age of Onset  . Hypertension Mother   . Hypertension Father     Social History   Social History Narrative   Divorced, retired from [employer] system   + children   No EtOH or tobacco     REVIEW OF SYSTEMS: Constitutional:  Weakness.  Before admission was able to mobilize in the house with a walker. ENT:  No nose bleeds Pulm: No shortness of breath or cough. CV:  No palpitations, no LE edema.  GU:  No hematuria, no frequency GI:  Per HPI Heme: No unusual bleeding or bruising. Transfusions: None this admission but was transfused as per HPI.   Neuro:  No headaches, no peripheral tingling or numbness Derm:  No itching, no rash or sores.  Endocrine:  No sweats or chills.  No polyuria or dysuria Immunization:  Not queried.   Travel:  None beyond local counties in last few months.    PHYSICAL EXAM: Vital signs in last 24 hours: Vitals:   12/06/17 0755 12/06/17 0800  BP: 111/69 130/81  Pulse: (!) 108 85  Resp: (!) 22 18  Temp: 98.5 F (36.9 C)   SpO2: 99% 99%   Wt Readings from Last 3 Encounters:  12/04/17 169 lb 5 oz (76.8 kg)  11/08/17 182 lb 8.7 oz (82.8 kg)  10/07/17 171 lb 4 oz (77.7 kg)    General: Cachectic, severely chronically ill looking AAF.  She is weak and speaks with a very soft voice. Head: No facial asymmetry or swelling. Eyes: No scleral icterus.  No conjunctival pallor. Ears: Not HOH Nose: No discharge Mouth: Oral mucosa unremarkable. Neck: No JVD, no masses. Lungs:  Overall diminished breath sounds but no adventitious sounds.  No labored breathing.  Port-A-Cath on  the right upper chest wall. Heart: RRR.  No MRG.  S1, S2 present. Abdomen: Distended, moderately tense.  Patient examined while lying in the left lateral decubitus position.  There is a palpable mass in the mid to lower right  abdomen.  No abdominal tenderness.  Bowel sounds hypoactive but no tinkling or tympanitic quality..   Rectal: Deferred Musc/Skeltl: No joint redness or swelling. Extremities: 2-3+ edema in the lower legs and feet. Neurologic: Answers appropriately.  Laconic.  Moves all 4 limbs.  No tremor. Skin: No rashes or sores.   Psych: Flat, distant affect.  Intake/Output from previous day: 04/28 0701 - 04/29 0700 In: 2272.5 [P.O.:460; I.V.:1312.5; IV Piggyback:500] Out: 250 [Urine:250] Intake/Output this shift: No intake/output data recorded.  LAB RESULTS: Recent Labs    12/03/17 2018 12/04/17 1122 12/05/17 0903  WBC 8.3 21.6* 16.9*  HGB 10.0* 9.4* 9.2*  HCT 31.5* 29.2* 28.1*  PLT 316 297 242   BMET Lab Results  Component Value Date   NA 139 12/06/2017   NA 141 12/05/2017   NA 141 12/04/2017   K 3.5 12/06/2017   K 3.6 12/05/2017   K 3.6 12/04/2017   CL 111 12/06/2017   CL 112 (H) 12/05/2017   CL 111 12/04/2017   CO2 20 (L) 12/06/2017   CO2 20 (L) 12/05/2017   CO2 20 (L) 12/04/2017   GLUCOSE 98 12/06/2017   GLUCOSE 117 (H) 12/05/2017   GLUCOSE 139 (H) 12/04/2017   BUN 29 (H) 12/06/2017   BUN 28 (H) 12/05/2017   BUN 22 (H) 12/04/2017   CREATININE 3.71 (H) 12/06/2017   CREATININE 3.69 (H) 12/05/2017   CREATININE 3.25 (H) 12/04/2017   CALCIUM 8.5 (L) 12/06/2017   CALCIUM 8.7 (L) 12/05/2017   CALCIUM 8.6 (L) 12/04/2017   LFT Recent Labs    12/03/17 2018  PROT 5.7*  ALBUMIN 2.2*  AST 15  ALT 7*  ALKPHOS 84  BILITOT 0.4   PT/INR Lab Results  Component Value Date   INR 1.08 12/06/2017   INR 1.14 11/01/2017   INR 1.05 10/06/2017   Lipase     Component Value Date/Time   LIPASE 25 12/03/2017 2018

## 2017-12-06 NOTE — Telephone Encounter (Signed)
Dr Ardis Shawna Schneider, the pt is currently admitted, does she still need procedure?

## 2017-12-06 NOTE — Procedures (Signed)
Placement of right nephrostomy tube.  Moderate hydronephrosis resolved after nephrostomy tube placement.  Minimal blood loss and no immediate complication.

## 2017-12-06 NOTE — Sedation Documentation (Signed)
Patient is resting comfortably. 

## 2017-12-06 NOTE — Consult Note (Signed)
Chief Complaint: Patient was seen in consultation today for right hydronephrosis  Referring Physician(s): Dr. Raynelle Bring  Supervising Physician: Markus Daft  Patient Status: [facility] - In-pt  History of Present Illness: Shawna Schneider is a 83 y.o. female with past medical history of HLD, HTN, stroke, and metastatic colon cancer with peritoneal carcinomatosis who recently transitioned to hospice care.  She is known to radiology department for regular paracentesis until recently when she was determined to not be a candidate for further fluid removal due to gelatinous fluid. For the past one week she developed worsening abdominal and back pain associated with nausea and vomiting.  Her family revoked hospice and sought evaluation in the ED for relief of her symptoms.   CT Abdomen Pelvis 12/04/17 showed: 1. Prominent diffuse abdominal and pelvic ascites with nodularity to the omentum and peritoneum consistent with peritoneal carcinomatosis. Similar to prior study. 2. Probable metastatic pulmonary nodules, retroperitoneal lymph nodes, and possible pelvic bone metastasis. 3. New right hydronephrosis and hydroureter of indeterminate etiology. No stones are identified. 4. Hemorrhagic cysts in the right kidney. 5. Interval improvement of pleural effusions and atelectasis in the lung bases since previous study. 6. Aortic atherosclerosis. 7. Residual contrast material throughout the colon and in colonic diverticula. No evidence of obstruction. 8. Midline anterior abdominal wall hernias, 1 containing fluid and another containing a small portion of the wall of the small bowel loop. No obstruction. 9. Soft tissue edema throughout the subcutaneous fat.  She was also found to have an elevated SCr above her baseline.  Urology was consulted and after extensive discussion with patient and family recommended a percutaneous nephrostomy tube for her hydronephrosis.  IR consulted for placement.    Case reviewed by Dr. Anselm Pancoast.   Past Medical History:  Diagnosis Date  . Cancer ([place])   . Hyperlipemia   . Hypertension   . Seizures ([place]) 11/01/2017   no hx before today  . Solitary kidney   . Stroke [facility]) 05/2017   pt received TPA and has no deficits    Past Surgical History:  Procedure Laterality Date  . COLONOSCOPY WITH PROPOFOL N/A 05/28/2017   Procedure: COLONOSCOPY WITH PROPOFOL;  Surgeon: Milus Banister, MD;  Location: [facility] ENDOSCOPY;  Service: Endoscopy;  Laterality: N/A;  . ESOPHAGOGASTRODUODENOSCOPY N/A 05/28/2017   Procedure: ESOPHAGOGASTRODUODENOSCOPY (EGD);  Surgeon: Milus Banister, MD;  Location: [facility] ENDOSCOPY;  Service: Endoscopy;  Laterality: N/A;  . IR FLUORO GUIDE PORT INSERTION RIGHT  06/23/2017  . IR US GUIDE VASC ACCESS RIGHT  06/23/2017  . KIDNEY SURGERY      Allergies: Ceftriaxone and Penicillins  Medications: Prior to Admission medications   Medication Sig Start Date End Date Taking? Authorizing Provider  allopurinol (ZYLOPRIM) 100 MG tablet Take 100 mg by mouth daily. 02/25/17  Yes [provider]  enoxaparin (LOVENOX) 80 MG/0.8ML injection INJECT 0.8 ML UNDER THE SKIN ONCE DAILY 11/26/17  Yes Ladell Pier, MD  HYDROcodone-acetaminophen (NORCO) 5-325 MG tablet Take 1 tablet by mouth every 6 (six) hours as needed for moderate pain. 11/08/17  Yes Emokpae, Courage, MD  latanoprost (XALATAN) 0.005 % ophthalmic solution Place 1 drop into both eyes at bedtime.   Yes [provider]  levETIRAcetam (KEPPRA) 500 MG tablet Take 1 tablet (500 mg total) by mouth 2 (two) times daily. 11/08/17  Yes Emokpae, Courage, MD  levothyroxine (SYNTHROID, LEVOTHROID) 75 MCG tablet Take 75 mcg by mouth daily. 04/16/17  Yes [provider]  lidocaine-prilocaine (EMLA) cream Apply to  port site one hour prior to use. Do not rub in. Cover with plastic. 06/16/17  Yes Ladell Pier, MD  ondansetron (ZOFRAN ODT) 4 MG disintegrating tablet Take 2 tablets (8 mg  total) by mouth every 8 (eight) hours as needed for nausea or vomiting. 11/23/17  Yes Owens Shark, NP  ondansetron (ZOFRAN) 8 MG tablet Take 8 mg by mouth every 8 (eight) hours as needed for nausea or vomiting.   Yes [provider]  potassium chloride SA (K-DUR,KLOR-CON) 20 MEQ tablet Take 1 tablet (20 mEq total) by mouth daily. 11/10/17  Yes Ladell Pier, MD  prochlorperazine (COMPAZINE) 5 MG tablet Take 5 mg by mouth every 6 (six) hours as needed for nausea or vomiting.   Yes [provider]     Family History  Problem Relation Age of Onset  . Hypertension Mother   . Hypertension Father     Social History   Socioeconomic History  . Marital status: Divorced    Spouse name: Not on file  . Number of children: Not on file  . Years of education: Not on file  . Highest education level: Not on file  Occupational History  . Not on file  Social Needs  . Financial resource strain: Not on file  . Food insecurity:    Worry: Not on file    Inability: Not on file  . Transportation needs:    Medical: Not on file    Non-medical: Not on file  Tobacco Use  . Smoking status: Never Smoker  . Smokeless tobacco: Never Used  Substance and Sexual Activity  . Alcohol use: No  . Drug use: No  . Sexual activity: Not Currently  Lifestyle  . Physical activity:    Days per week: Not on file    Minutes per session: Not on file  . Stress: Not on file  Relationships  . Social connections:    Talks on phone: Not on file    Gets together: Not on file    Attends religious service: Not on file    Active member of club or organization: Not on file    Attends meetings of clubs or organizations: Not on file    Relationship status: Not on file  Other Topics Concern  . Not on file  Social History Narrative  . Not on file     Review of Systems: A 12 point ROS discussed and pertinent positives are indicated in the HPI above.  All other systems are negative.  Review of Systems    Constitutional: Negative for fatigue and fever.  Respiratory: Negative for cough and shortness of breath.   Cardiovascular: Negative for chest pain.  Gastrointestinal: Positive for abdominal pain, nausea and vomiting.  Musculoskeletal: Positive for back pain.  Psychiatric/Behavioral: Negative for behavioral problems and confusion.    Vital Signs: BP 130/81   Pulse 85   Temp 98.5 F (36.9 C) (Oral)   Resp 18   Ht 5\' 3"  (1.6 m)   Wt 169 lb 5 oz (76.8 kg)   SpO2 99%   BMI 29.99 kg/m   Physical Exam  Constitutional: She is oriented to person, place, and time. She appears well-developed.  Cardiovascular: Normal rate, regular rhythm and normal heart sounds.  Pulmonary/Chest: Effort normal and breath sounds normal. No respiratory distress.  Abdominal: Soft. She exhibits distension. There is tenderness.  Neurological: She is alert and oriented to person, place, and time.  Skin: Skin is warm and dry.  Psychiatric: She  has a normal mood and affect. Her behavior is normal. Judgment and thought content normal.  Nursing note and vitals reviewed.   MD Evaluation Airway: WNL Heart: WNL Abdomen: WNL Chest/ Lungs: WNL ASA  Classification: 4 Mallampati/Airway Score: Two   Imaging: Ct Abdomen Pelvis Wo Contrast  Result Date: 12/04/2017 CLINICAL DATA:  Nausea, vomiting, and abdominal pain for 3 days. History of colon cancer. Stop chemotherapy about 3 weeks ago. Abdominal distention. EXAM: CT ABDOMEN AND PELVIS WITHOUT CONTRAST TECHNIQUE: Multidetector CT imaging of the abdomen and pelvis was performed following the standard protocol without IV contrast. COMPARISON:  11/04/2017 FINDINGS: Lower chest: Bilateral pulmonary nodules measuring up to 2.4 cm diameter, likely pulmonary metastases. Since the previous study, there has been near complete resolution of pleural effusions and basilar atelectasis. Mild residual atelectasis in the right lung base. Moderately large esophageal hiatal hernia.  Hepatobiliary: No focal liver lesions identified. Liver appears atrophic, possibly cirrhotic. Gallbladder is distended. No wall thickening, stone, or inflammatory infiltration. No bile duct dilatation. Pancreas: Unremarkable. No pancreatic ductal dilatation or surrounding inflammatory changes. Spleen: Normal in size without focal abnormality. Adrenals/Urinary Tract: Left kidney is absent, possibly congenital or postoperative. Multiple hyperdense lesions in the right kidney likely representing hemorrhagic cysts. Right hydronephrosis and hydroureter. Difficult to follow the ureter due to ascites. Cause of obstruction is not identified. This could represent obstruction or reflux. Appearance is new since prior study. Stomach/Bowel: Stomach, small bowel, and colon are mostly decompressed. There is residual contrast material demonstrated throughout the colon. Multiple colonic diverticula with contrast filling. Vascular/Lymphatic: Calcification of the abdominal aorta. No aneurysm. Moderate prominent lymph nodes in the retroperitoneum, some with increased density. These may be metastatic. Reproductive: Limited visualization of the uterus. No obvious pelvic masses. Other: Diffuse abdominal and pelvic ascites with nodular infiltration of the mesentery and peritoneal implants consistent with diffuse peritoneal carcinomatosis. Small midline anterior abdominal wall hernias containing fluid and a small portion of the wall of small bowel. Soft tissue edema throughout the subcutaneous fat. No free air. Emphysema in the subcutaneous soft tissues of the anterior abdominal wall likely representing a site of injection. Musculoskeletal: Degenerative changes in the spine. Vague lucent lesions demonstrated in the right iliac bone and acetabulum may represent degenerative cysts or lucent metastasis. IMPRESSION: 1. Prominent diffuse abdominal and pelvic ascites with nodularity to the omentum and peritoneum consistent with peritoneal  carcinomatosis. Similar to prior study. 2. Probable metastatic pulmonary nodules, retroperitoneal lymph nodes, and possible pelvic bone metastasis. 3. New right hydronephrosis and hydroureter of indeterminate etiology. No stones are identified. 4. Hemorrhagic cysts in the right kidney. 5. Interval improvement of pleural effusions and atelectasis in the lung bases since previous study. 6. Aortic atherosclerosis. 7. Residual contrast material throughout the colon and in colonic diverticula. No evidence of obstruction. 8. Midline anterior abdominal wall hernias, 1 containing fluid and another containing a small portion of the wall of the small bowel loop. No obstruction. 9. Soft tissue edema throughout the subcutaneous fat. Electronically Signed   By: Lucienne Capers M.D.   On: 12/04/2017 00:28   Dg Esophagus  Result Date: 12/01/2017 CLINICAL DATA:  Esophageal dysphagia. Vomiting. History of metastatic colon cancer. EXAM: ESOPHOGRAM/BARIUM SWALLOW TECHNIQUE: Single contrast examination was performed using  thin barium. FLUOROSCOPY TIME:  Fluoroscopy Time:  1 minutes and 36 seconds Radiation Exposure Index (if provided by the fluoroscopic device): 20.6 mGy Number of Acquired Spot Images: 0 COMPARISON:  Chest CT 11/04/2017 FINDINGS: Initial barium swallows demonstrate normal pharyngeal motion with swallowing.  No laryngeal penetration or aspiration. No upper esophageal webs, strictures or diverticuli. Esophageal dysmotility with poor propagation of the primary peristaltic wave and frequent tertiary contractions/esophageal spasm. Distal esophageal stricture suspected just above the esophageal vestibule. Patient could not attempt to swallow the 13 mm barium pill because of vomiting. Small sliding-type hiatal hernia. IMPRESSION: 1. Esophageal dysmotility. 2. Suspect distal esophageal stricture just above the esophageal vestibule. Endoscopy may be helpful for further evaluation and treatment. 3. Small hiatal hernia.  Electronically Signed   By: Marijo Sanes M.D.   On: 12/01/2017 14:44   Dg Abd Acute W/chest  Result Date: 12/03/2017 CLINICAL DATA:  Vomiting with history of colon cancer EXAM: DG ABDOMEN ACUTE W/ 1V CHEST COMPARISON:  Chest CT 11/04/2017 FINDINGS: Accessed, power injectable right chest wall Port-A-Cath follows a right internal jugular vein approach with tip in the lower SVC. There are multiple bilateral pulmonary nodules. Right hemidiaphragm is elevated. No pneumothorax or pleural effusion. There is retained contrast material in the colon. No dilated small bowel. No free intraperitoneal air. IMPRESSION: 1. Multiple pulmonary metastases, as previously demonstrated by chest CT. 2. Retained enteric contrast within the colon. No evidence of small-bowel obstruction. Electronically Signed   By: Ulyses Jarred M.D.   On: 12/03/2017 21:24    Labs:  CBC: Recent Labs    11/10/17 1031 12/03/17 2018 12/04/17 1122 12/05/17 0903  WBC 3.4* 8.3 21.6* 16.9*  HGB 9.2* 10.0* 9.4* 9.2*  HCT 28.8* 31.5* 29.2* 28.1*  PLT 182 316 297 242    COAGS: Recent Labs    05/24/17 1320  06/23/17 0743 07/17/17 1150 10/06/17 0635 11/01/17 1533 12/06/17 0330  INR 1.11   < > 1.18 1.37 1.05 1.14 1.08  APTT 33  --  41*  --   --   --   --    < > = values in this interval not displayed.    BMP: Recent Labs    12/03/17 2018 12/04/17 1122 12/05/17 0903 12/06/17 0330  NA 141 141 141 139  K 3.5 3.6 3.6 3.5  CL 110 111 112* 111  CO2 18* 20* 20* 20*  GLUCOSE 115* 139* 117* 98  BUN 18 22* 28* 29*  CALCIUM 9.1 8.6* 8.7* 8.5*  CREATININE 3.33* 3.25* 3.69* 3.71*  GFRNONAA 12* 12* 11* 11*  GFRAA 14* 14* 12* 12*    LIVER FUNCTION TESTS: Recent Labs    10/27/17 1103 11/01/17 1533 11/01/17 2108 11/10/17 1031 12/03/17 2018  BILITOT 0.3 0.3  --  0.2 0.4  AST 10 41  --  11 15  ALT 7 13*  --  8 7*  ALKPHOS 92 98  --  94 84  PROT 6.5 6.0*  --  5.8* 5.7*  ALBUMIN 2.5* 2.6* 2.2* 2.3* 2.2*    TUMOR  MARKERS: No results for input(s): AFPTM, CEA, CA199, CHROMGRNA in the last 8760 hours.  Assessment and Plan: Right hydronephrosis Patient with right hydronephrosis identified by CT scan.  Also with elevated SCr to 3.71.  Patient and family would like to pursue interventions that may alleviate her symptoms and provide comfort. IR consulted for percutaneous nephrostomy tube placement at the request of Dr. Alinda Money.   Patient and family are aware of possible risks with this procedure.  Patient has a solitary kidney, she will likely leave the hospital with tube in place.  She also has extensive ascites. Patient and family would like to proceed with nephrostomy tube placement.  Case reviewed by Dr. Anselm Pancoast who is agreeable to proceed.  She has been NPO today.   Risks and benefits were discussed with the patient including, but not limited to, infection, bleeding, significant bleeding causing loss or decrease in renal function or damage to adjacent structures.   All of the patient's questions were answered, patient is agreeable to proceed.  Consent signed and in chart.  Thank you for this interesting consult.  I greatly enjoyed meeting Shawna Schneider and look forward to participating in their care.  A copy of this report was sent to the requesting provider on this date.  Electronically Signed: Docia Barrier, PA 12/06/2017, 1:50 PM   I spent a total of 40 Minutes    in face to face in clinical consultation, greater than 50% of which was counseling/coordinating care for hydronephrosis, right.

## 2017-12-06 NOTE — H&P (View-Only) (Signed)
[facility] Consult: 11:42 AM 12/06/2017  LOS: 3 days    Referring Provider: Dr Hulen Shouts  Primary Care Physician:  [name], MD Primary Gastroenterologist:  Dr. Ardis Hughs     Reason for Consultation:  Dysphagia.      IMPRESSION:   *    Dysphagia.  Suspect this is dysmotility rather than the esophageal ring causing the problem.  *  Metastatic cancer.  Colon mass on colonoscopy was TA without HGD.  Further studies revealed gastrointestinal or pancreatico/biliary primary. Ongoing carcinomatosis and likely malignant ascites despite chemo.    *   History DVT, Eliquis discontinued because of GI bleeding but started on Lovenox after diagnosis of recurrent DVT and PE in 08/2017.  Lovenox on hold currently in anticipation of endoscopic, cystoscopic intervention.  Last received Lovenox 4/26, receiving SQ Heparin instead, last given 2100 yesterday.    *  Chronic anemia.  Stable.  Platelets and coags normal.    *  Right hydronephrosis and hydroureter, oliguria with AKI, GFR 12, was 31 on 11/10/17 (stage 5 CKD).  Nephrostomy tube placement planned.      PLAN:     *  ? EGD.  Dr Carlean Purl will see pt and decide.  Could be done as soon as tomorrow.  Need to work timing with timing of nephrostomy tube placement (IR to place but have not yet seen pt, have they been consulted???).     [name]  12/06/2017, 11:42 AM Phone [place] Attending   I have taken an interval history, reviewed the chart and examined the patient. I agree with the Advanced Practitioner's note, impression and recommendations.    Dysphagia Nausea and vomiting/regurgitation Esophageal dysmotility Paraesophageal hiatal hernia Schatzki ring Metastatic colon cancer - on hospice now off to treat symptoms and  signs   Difficult problem - I have reviewed CT scans, and ba swallow and 2018 EGD. I showed esophagram to son. I believe paraesophageal hiatal hernia is the cause of dysphagia and am unlikely to be able to help her. That said - she did have a Schatzki ring (patent) in 2018 so disrupting that may help. Less likely but possible would be mediastinal metastases.  Apparently (per son) the ascites is gelatinous and cannot drain - reduction in ascites could possibly help though overall running out of good options.  She and family have desire to palliate and she is pretty miserable with dysphagia and regurgitation so will see what I can do.  EGD, ? Esophageal dilation hopefully tommorow. The risks and benefits as well as alternatives of endoscopic procedure(s) have been discussed and reviewed. All questions answered. The patient agrees to proceed. Son present.   Gatha Mayer, MD, [facility] 12/06/2017 3:20 PM     HPI: Shawna Schneider is a 83 y.o. female.   Hx cervical cancer age 20.   renal cell cancer, nephrectomy ~ 2003.  CVA 05/2017, treated with tPA.  Had LGIB and anemia requiring transfusion.   Metastatic cancer diagnosed 05/2017 with widespread mets at time of  diagnosis. 05/2017 Colonoscopy: partially obstructing soft tissue mass at descending colon.   Pathology: superficial fragments of TVA without HGD  On further path studies: gastrointestinal or pancreatico/biliary primary.  Dr Benay Spice treating with chemo: FOLFOX >> FOLFIRI.  Last chemo was end of March 2019.  Bil DVT 05/2017 at time of CVA and cancer diagnosis.  Was on Eliquis but it was discontinued after GIB in 07/2017 requiring transfusion.  Endoscopies not repeated, bleeding attributed to colon tumor.  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       Admitted 3/25 - 4/1 for status epilepticus.  No brain mets found on CT.  Treated for aspiration PNA.   Hospice pt starting 1 week ago but  hospice/DNR rescinded since admission 3 d ago.    Bowel incontinence, brown/loose BMs 1 - 2 per day.  LE edema.  Abdominal swelling from ascites for a few weeks.  Denies abdominal pain. Though admitting notes mentions epigastric abdominal pain, distention and vomiting of dark material..   For several months having solid and liquid dysphagia.  Progressive in recent weeks to where unable to swallow anything, regurgitates po back up.  Even spits out saliva.  Ongoing weight loss.   EGD 05/2017: tortuous esophagus.  Minor Schatskis ring at GEJ, large HH with ? Paraesophageal component.  Esophagram 4/24: esoph dysmotility, suspicion of stricture at distal esophagus, small HH.  Vomited during study, could not swallow 13 mm pill.    CT ab/pelvis without contrast 12/04/17: Diffuse, prominent ascites in the abdomen and pelvis with nodular omentum and peritoneum consistent with carcinomatosis.  Probable metastatic lesions in the lungs.  New right hydronephrosis and hydroureter.  Midline anterior abdominal wall hernias one containing fluid and another containing small portion of small bowel.  Urology met with pt and family and wish to have IR perform nephrostomy tube placement as was Dr Lynne Logan suggestion.      Past Medical History:  Diagnosis Date  . Cancer ([place])   . Hyperlipemia   . Hypertension   . Seizures ([place]) 11/01/2017   no hx before today  . Solitary kidney   . Stroke [facility]) 05/2017   pt received TPA and has no deficits    Past Surgical History:  Procedure Laterality Date  . COLONOSCOPY WITH PROPOFOL N/A 05/28/2017   Procedure: COLONOSCOPY WITH PROPOFOL;  Surgeon: Milus Banister, MD;  Location: [facility] ENDOSCOPY;  Service: Endoscopy;  Laterality: N/A;  . ESOPHAGOGASTRODUODENOSCOPY N/A 05/28/2017   Procedure: ESOPHAGOGASTRODUODENOSCOPY (EGD);  Surgeon: Milus Banister, MD;  Location: [facility] ENDOSCOPY;  Service: Endoscopy;  Laterality: N/A;  . IR FLUORO GUIDE PORT INSERTION RIGHT  06/23/2017  . IR  US GUIDE VASC ACCESS RIGHT  06/23/2017  . KIDNEY SURGERY      Prior to Admission medications   Medication Sig Start Date End Date Taking? Authorizing Provider  allopurinol (ZYLOPRIM) 100 MG tablet Take 100 mg by mouth daily. 02/25/17  Yes [provider]  enoxaparin (LOVENOX) 80 MG/0.8ML injection INJECT 0.8 ML UNDER THE SKIN ONCE DAILY 11/26/17  Yes Ladell Pier, MD  HYDROcodone-acetaminophen (NORCO) 5-325 MG tablet Take 1 tablet by mouth every 6 (six) hours as needed for moderate pain. 11/08/17  Yes Emokpae, Courage, MD  latanoprost (XALATAN) 0.005 % ophthalmic solution Place 1 drop into both eyes at bedtime.   Yes [provider]  levETIRAcetam (KEPPRA) 500 MG tablet Take 1 tablet (500 mg total) by mouth 2 (two) times daily. 11/08/17  Yes Roxan Hockey, MD  levothyroxine (SYNTHROID,  LEVOTHROID) 75 MCG tablet Take 75 mcg by mouth daily. 04/16/17  Yes [provider]  lidocaine-prilocaine (EMLA) cream Apply to port site one hour prior to use. Do not rub in. Cover with plastic. 06/16/17  Yes Ladell Pier, MD  ondansetron (ZOFRAN ODT) 4 MG disintegrating tablet Take 2 tablets (8 mg total) by mouth every 8 (eight) hours as needed for nausea or vomiting. 11/23/17  Yes Owens Shark, NP  ondansetron (ZOFRAN) 8 MG tablet Take 8 mg by mouth every 8 (eight) hours as needed for nausea or vomiting.   Yes [provider]  potassium chloride SA (K-DUR,KLOR-CON) 20 MEQ tablet Take 1 tablet (20 mEq total) by mouth daily. 11/10/17  Yes Ladell Pier, MD  prochlorperazine (COMPAZINE) 5 MG tablet Take 5 mg by mouth every 6 (six) hours as needed for nausea or vomiting.   Yes [provider]    Scheduled Meds: . allopurinol  100 mg Oral Daily  . potassium chloride SA  20 mEq Oral Daily   Infusions: . dextrose 5 % and 0.45% NaCl 125 mL/hr at 12/06/17 1007  . famotidine (PEPCID) IV 20 mg (12/06/17 1007)  . levETIRAcetam 500 mg (12/06/17 1011)   PRN  Meds: acetaminophen **OR** acetaminophen, fentaNYL (SUBLIMAZE) injection, ipratropium-albuterol, lidocaine-prilocaine, metoprolol tartrate, ondansetron **OR** ondansetron (ZOFRAN) IV, prochlorperazine   Allergies as of 12/03/2017 - Review Complete 12/03/2017  Allergen Reaction Noted  . Ceftriaxone Swelling 11/02/2017  . Penicillins Hives 05/06/2012    Family History  Problem Relation Age of Onset  . Hypertension Mother   . Hypertension Father     Social History   Social History Narrative   Divorced, retired from [employer] system   + children   No EtOH or tobacco     REVIEW OF SYSTEMS: Constitutional:  Weakness.  Before admission was able to mobilize in the house with a walker. ENT:  No nose bleeds Pulm: No shortness of breath or cough. CV:  No palpitations, no LE edema.  GU:  No hematuria, no frequency GI:  Per HPI Heme: No unusual bleeding or bruising. Transfusions: None this admission but was transfused as per HPI.   Neuro:  No headaches, no peripheral tingling or numbness Derm:  No itching, no rash or sores.  Endocrine:  No sweats or chills.  No polyuria or dysuria Immunization:  Not queried.   Travel:  None beyond local counties in last few months.    PHYSICAL EXAM: Vital signs in last 24 hours: Vitals:   12/06/17 0755 12/06/17 0800  BP: 111/69 130/81  Pulse: (!) 108 85  Resp: (!) 22 18  Temp: 98.5 F (36.9 C)   SpO2: 99% 99%   Wt Readings from Last 3 Encounters:  12/04/17 169 lb 5 oz (76.8 kg)  11/08/17 182 lb 8.7 oz (82.8 kg)  10/07/17 171 lb 4 oz (77.7 kg)    General: Cachectic, severely chronically ill looking AAF.  She is weak and speaks with a very soft voice. Head: No facial asymmetry or swelling. Eyes: No scleral icterus.  No conjunctival pallor. Ears: Not HOH Nose: No discharge Mouth: Oral mucosa unremarkable. Neck: No JVD, no masses. Lungs:  Overall diminished breath sounds but no adventitious sounds.  No labored breathing.  Port-A-Cath on  the right upper chest wall. Heart: RRR.  No MRG.  S1, S2 present. Abdomen: Distended, moderately tense.  Patient examined while lying in the left lateral decubitus position.  There is a palpable mass in the mid to lower right  abdomen.  No abdominal tenderness.  Bowel sounds hypoactive but no tinkling or tympanitic quality..   Rectal: Deferred Musc/Skeltl: No joint redness or swelling. Extremities: 2-3+ edema in the lower legs and feet. Neurologic: Answers appropriately.  Laconic.  Moves all 4 limbs.  No tremor. Skin: No rashes or sores.   Psych: Flat, distant affect.  Intake/Output from previous day: 04/28 0701 - 04/29 0700 In: 2272.5 [P.O.:460; I.V.:1312.5; IV Piggyback:500] Out: 250 [Urine:250] Intake/Output this shift: No intake/output data recorded.  LAB RESULTS: Recent Labs    12/03/17 2018 12/04/17 1122 12/05/17 0903  WBC 8.3 21.6* 16.9*  HGB 10.0* 9.4* 9.2*  HCT 31.5* 29.2* 28.1*  PLT 316 297 242   BMET Lab Results  Component Value Date   NA 139 12/06/2017   NA 141 12/05/2017   NA 141 12/04/2017   K 3.5 12/06/2017   K 3.6 12/05/2017   K 3.6 12/04/2017   CL 111 12/06/2017   CL 112 (H) 12/05/2017   CL 111 12/04/2017   CO2 20 (L) 12/06/2017   CO2 20 (L) 12/05/2017   CO2 20 (L) 12/04/2017   GLUCOSE 98 12/06/2017   GLUCOSE 117 (H) 12/05/2017   GLUCOSE 139 (H) 12/04/2017   BUN 29 (H) 12/06/2017   BUN 28 (H) 12/05/2017   BUN 22 (H) 12/04/2017   CREATININE 3.71 (H) 12/06/2017   CREATININE 3.69 (H) 12/05/2017   CREATININE 3.25 (H) 12/04/2017   CALCIUM 8.5 (L) 12/06/2017   CALCIUM 8.7 (L) 12/05/2017   CALCIUM 8.6 (L) 12/04/2017   LFT Recent Labs    12/03/17 2018  PROT 5.7*  ALBUMIN 2.2*  AST 15  ALT 7*  ALKPHOS 84  BILITOT 0.4   PT/INR Lab Results  Component Value Date   INR 1.08 12/06/2017   INR 1.14 11/01/2017   INR 1.05 10/06/2017   Lipase     Component Value Date/Time   LIPASE 25 12/03/2017 2018

## 2017-12-06 NOTE — Progress Notes (Signed)
Patient Demographics:    Shawna Schneider, is a 83 y.o. female, DOB - 1935/07/29, QPY:[id number]  Admit date - 12/03/2017   Admitting Physician Norval Morton, MD  Outpatient Primary MD for the patient is [name], MD  LOS - 3   Chief Complaint  Patient presents with  . Emesis        Subjective:    Shawna Schneider today has no fevers, no further emesis,  No chest pain, no new concerns  Assessment  & Plan :    Principal Problem:   Intractable nausea and vomiting Active Problems:   Malignant neoplasm of descending colon (HCC)   Metastatic cancer (HCC)   Seizure (HCC)   Acute renal failure superimposed on chronic kidney disease (HCC)   Dyspnea   Esophageal dysphagia   Paraesophageal hiatal hernia   Esophageal dysmotility  Brief Summary 83 y.o. female with medical history significant of renal cell carcinoma s/p nephrectomy, HTN, stroke, DVT on lovenox (eliquis d/c after GI bleeding), esophageal stricture, and current metastatic adenocarcinoma of the colon on chemotherapy; who was admitted on 12/03/2017 with persistent nausea vomiting from home previously under hospice care prior to admission.  CT abdomen and pelvis suggest disease progression with new metastatic lesions in the pelvis and lungs, however no bowel obstruction there is concerns about hydronephrosis .  Patient and family has rescinded hospice as of 12/05/2017.. They want full scope of treatment including GI consult for possible EGD with dilatation due to esophageal stricture, as well as neurology and interventional Radiology consult for possible right nephrostomy placement for right-sided hydronephrosis and hydroureter   Plan:- 1)Stage IV Metastatic Colon Cancer-  abdominal pain, nausea and vomiting is improved, CT abdomen without bowel obstruction, will attempt clear liquid diet and advance as tolerated, patient is not a candidate for  further chemo or Radiation therapy Dr. Learta Codding her oncologist  2)Social/Ethics-  Patient and family [name]  (son POA) and daughter [name]) has rescinded hospice as of 12/05/2017.. They want full scope of treatment including GI consult for possible EGD with dilatation due to esophageal stricture, as well as urology and interventional radiology consult for possible right nephrostomy placement for right-sided hydronephrosis and hydroureter.   hospice services notified.  Hospice input appreciated.  Patient has now rescinded hospice at this time  3)AKI----acute kidney injury on CKD stage III  -     creatinine on admission=3.33 ,   baseline creatinine = 1.8   , creatinine is now= 3.71  ,  Avoid nephrotoxic agents/dehydration/hypotension, most likely related to added hydronephrosis and hydroureter which may be related to ureteral obstruction from metastatic lesions  4)Seizure-continue Keppra, recent MRI of the brain without metastatic lesions  5) chronic anemia of chronic disease-secondary to underlying malignancy and prior chemotherapy, monitor H&H and transfuse as clinically indicated  6)H/o esophageal stricture--patient and family requesting EGD with dilatation, GI consult appreciated, for possible EGD with dilatation due to esophageal stricture on 12/07/17, , avoid therapeutic doses of anticoagulants at this time, n.p.o. after midnight  7)H/o DVT- avoid therapeutic doses of anticoagulants at this time to allow for possible EGD with dilatation on 12/07/17  8) right-sided hydronephrosis and hydroureter with rising creatinine- urology and interventional radiology consult appreciated, had right nephrostomy placement for right-sided hydronephrosis and  hydroureter on 12/06/17, discussed with   urologist Dr. Alinda Money  Code Status : Partial code  Disposition Plan  : Patient and family [name]  (son POA) and daughter [name]) has rescinded hospice as of 12/05/2017.. They want full scope of treatment  Consults  :   Palliative/Hospice/Gi Service/Urology/IR  Procedures -Right-sided nephrostomy tube placement on 12/06/2017 -EGD with dilatation planned for 12/07/2017  DVT Prophylaxis  :    SCDs    Lab Results  Component Value Date   PLT 242 12/05/2017    Inpatient Medications  Scheduled Meds: . fentaNYL      . lidocaine      . midazolam      . allopurinol  100 mg Oral Daily  . potassium chloride SA  20 mEq Oral Daily   Continuous Infusions: . ciprofloxacin 400 mg (12/06/17 1702)  . dextrose 5 % and 0.45% NaCl 125 mL/hr at 12/06/17 1007  . famotidine (PEPCID) IV Stopped (12/06/17 1037)  . levETIRAcetam Stopped (12/06/17 1026)   PRN Meds:.acetaminophen **OR** acetaminophen, fentaNYL (SUBLIMAZE) injection, ipratropium-albuterol, lidocaine-prilocaine, metoprolol tartrate, ondansetron **OR** ondansetron (ZOFRAN) IV, prochlorperazine    Anti-infectives (From admission, onward)   Start     Dose/Rate Route Frequency Ordered Stop   12/06/17 1715  ciprofloxacin (CIPRO) IVPB 400 mg     400 mg 200 mL/hr over 60 Minutes Intravenous  Once 12/06/17 1701          Objective:   Vitals:   12/06/17 1702 12/06/17 1706 12/06/17 1710 12/06/17 1713  BP: (!) 123/99 119/85 (!) 122/93 (!) 126/93  Pulse: 100 (!) 101 (!) 102 (!) 101  Resp: (!) 25 (!) 24 18 16   Temp:      TempSrc:      SpO2: 99% 98% 98% 97%  Weight:      Height:        Wt Readings from Last 3 Encounters:  12/04/17 76.8 kg (169 lb 5 oz)  11/08/17 82.8 kg (182 lb 8.7 oz)  10/07/17 77.7 kg (171 lb 4 oz)    Intake/Output Summary (Last 24 hours) at 12/06/2017 1741 Last data filed at 12/06/2017 1500 Gross per 24 hour  Intake 3497.91 ml  Output 250 ml  Net 3247.91 ml    Physical Exam  Gen:- Awake Alert, chronically ill-appearing in no apparent distress  HEENT:- [place].AT, No sclera icterus Neck-Supple Neck,No JVD,.  Lungs-diminished in bases, no wheezing  CV- S1, S2 normal, right chest wall Port-A-Cath site is clean dry and  intact Abd-  +ve B.Sounds, Abd Soft, No tenderness, No CVA tenderness Extremity/Skin:- warm, dry Psych-affect is appropriate, oriented x3 Neuro-no new focal deficits, no tremors   Data Review:   Micro Results No results found for this or any previous visit (from the past 240 hour(s)).  Radiology Reports Ct Abdomen Pelvis Wo Contrast  Result Date: 12/04/2017 CLINICAL DATA:  Nausea, vomiting, and abdominal pain for 3 days. History of colon cancer. Stop chemotherapy about 3 weeks ago. Abdominal distention. EXAM: CT ABDOMEN AND PELVIS WITHOUT CONTRAST TECHNIQUE: Multidetector CT imaging of the abdomen and pelvis was performed following the standard protocol without IV contrast. COMPARISON:  11/04/2017 FINDINGS: Lower chest: Bilateral pulmonary nodules measuring up to 2.4 cm diameter, likely pulmonary metastases. Since the previous study, there has been near complete resolution of pleural effusions and basilar atelectasis. Mild residual atelectasis in the right lung base. Moderately large esophageal hiatal hernia. Hepatobiliary: No focal liver lesions identified. Liver appears atrophic, possibly cirrhotic. Gallbladder is distended. No wall thickening, stone, or inflammatory  infiltration. No bile duct dilatation. Pancreas: Unremarkable. No pancreatic ductal dilatation or surrounding inflammatory changes. Spleen: Normal in size without focal abnormality. Adrenals/Urinary Tract: Left kidney is absent, possibly congenital or postoperative. Multiple hyperdense lesions in the right kidney likely representing hemorrhagic cysts. Right hydronephrosis and hydroureter. Difficult to follow the ureter due to ascites. Cause of obstruction is not identified. This could represent obstruction or reflux. Appearance is new since prior study. Stomach/Bowel: Stomach, small bowel, and colon are mostly decompressed. There is residual contrast material demonstrated throughout the colon. Multiple colonic diverticula with contrast  filling. Vascular/Lymphatic: Calcification of the abdominal aorta. No aneurysm. Moderate prominent lymph nodes in the retroperitoneum, some with increased density. These may be metastatic. Reproductive: Limited visualization of the uterus. No obvious pelvic masses. Other: Diffuse abdominal and pelvic ascites with nodular infiltration of the mesentery and peritoneal implants consistent with diffuse peritoneal carcinomatosis. Small midline anterior abdominal wall hernias containing fluid and a small portion of the wall of small bowel. Soft tissue edema throughout the subcutaneous fat. No free air. Emphysema in the subcutaneous soft tissues of the anterior abdominal wall likely representing a site of injection. Musculoskeletal: Degenerative changes in the spine. Vague lucent lesions demonstrated in the right iliac bone and acetabulum may represent degenerative cysts or lucent metastasis. IMPRESSION: 1. Prominent diffuse abdominal and pelvic ascites with nodularity to the omentum and peritoneum consistent with peritoneal carcinomatosis. Similar to prior study. 2. Probable metastatic pulmonary nodules, retroperitoneal lymph nodes, and possible pelvic bone metastasis. 3. New right hydronephrosis and hydroureter of indeterminate etiology. No stones are identified. 4. Hemorrhagic cysts in the right kidney. 5. Interval improvement of pleural effusions and atelectasis in the lung bases since previous study. 6. Aortic atherosclerosis. 7. Residual contrast material throughout the colon and in colonic diverticula. No evidence of obstruction. 8. Midline anterior abdominal wall hernias, 1 containing fluid and another containing a small portion of the wall of the small bowel loop. No obstruction. 9. Soft tissue edema throughout the subcutaneous fat. Electronically Signed   By: Lucienne Capers M.D.   On: 12/04/2017 00:28   Dg Esophagus  Result Date: 12/01/2017 CLINICAL DATA:  Esophageal dysphagia. Vomiting. History of  metastatic colon cancer. EXAM: ESOPHOGRAM/BARIUM SWALLOW TECHNIQUE: Single contrast examination was performed using  thin barium. FLUOROSCOPY TIME:  Fluoroscopy Time:  1 minutes and 36 seconds Radiation Exposure Index (if provided by the fluoroscopic device): 20.6 mGy Number of Acquired Spot Images: 0 COMPARISON:  Chest CT 11/04/2017 FINDINGS: Initial barium swallows demonstrate normal pharyngeal motion with swallowing. No laryngeal penetration or aspiration. No upper esophageal webs, strictures or diverticuli. Esophageal dysmotility with poor propagation of the primary peristaltic wave and frequent tertiary contractions/esophageal spasm. Distal esophageal stricture suspected just above the esophageal vestibule. Patient could not attempt to swallow the 13 mm barium pill because of vomiting. Small sliding-type hiatal hernia. IMPRESSION: 1. Esophageal dysmotility. 2. Suspect distal esophageal stricture just above the esophageal vestibule. Endoscopy may be helpful for further evaluation and treatment. 3. Small hiatal hernia. Electronically Signed   By: Marijo Sanes M.D.   On: 12/01/2017 14:44   Dg Abd Acute W/chest  Result Date: 12/03/2017 CLINICAL DATA:  Vomiting with history of colon cancer EXAM: DG ABDOMEN ACUTE W/ 1V CHEST COMPARISON:  Chest CT 11/04/2017 FINDINGS: Accessed, power injectable right chest wall Port-A-Cath follows a right internal jugular vein approach with tip in the lower SVC. There are multiple bilateral pulmonary nodules. Right hemidiaphragm is elevated. No pneumothorax or pleural effusion. There is retained contrast material in  the colon. No dilated small bowel. No free intraperitoneal air. IMPRESSION: 1. Multiple pulmonary metastases, as previously demonstrated by chest CT. 2. Retained enteric contrast within the colon. No evidence of small-bowel obstruction. Electronically Signed   By: Ulyses Jarred M.D.   On: 12/03/2017 21:24     CBC Recent Labs  Lab 12/03/17 2018 12/04/17 1122  12/05/17 0903  WBC 8.3 21.6* 16.9*  HGB 10.0* 9.4* 9.2*  HCT 31.5* 29.2* 28.1*  PLT 316 297 242  MCV 86.3 85.9 86.7  MCH 27.4 27.6 28.4  MCHC 31.7 32.2 32.7  RDW 18.6* 18.7* 18.9*  LYMPHSABS 1.1  --   --   MONOABS 0.6  --   --   EOSABS 0.0  --   --   BASOSABS 0.0  --   --     Chemistries  Recent Labs  Lab 12/03/17 2018 12/04/17 1122 12/05/17 0903 12/06/17 0330  NA 141 141 141 139  K 3.5 3.6 3.6 3.5  CL 110 111 112* 111  CO2 18* 20* 20* 20*  GLUCOSE 115* 139* 117* 98  BUN 18 22* 28* 29*  CREATININE 3.33* 3.25* 3.69* 3.71*  CALCIUM 9.1 8.6* 8.7* 8.5*  AST 15  --   --   --   ALT 7*  --   --   --   ALKPHOS 84  --   --   --   BILITOT 0.4  --   --   --    ------------------------------------------------------------------------------------------------------------------ No results for input(s): CHOL, HDL, LDLCALC, TRIG, CHOLHDL, LDLDIRECT in the last 72 hours.  Lab Results  Component Value Date   HGBA1C 6.5 (H) 05/24/2017   ------------------------------------------------------------------------------------------------------------------ No results for input(s): TSH, T4TOTAL, T3FREE, THYROIDAB in the last 72 hours.  Invalid input(s): FREET3 ------------------------------------------------------------------------------------------------------------------ No results for input(s): VITAMINB12, FOLATE, FERRITIN, TIBC, IRON, RETICCTPCT in the last 72 hours.  Coagulation profile Recent Labs  Lab 12/06/17 0330  INR 1.08    No results for input(s): DDIMER in the last 72 hours.  Cardiac Enzymes No results for input(s): CKMB, TROPONINI, MYOGLOBIN in the last 168 hours.  Invalid input(s): CK ------------------------------------------------------------------------------------------------------------------ No results found for: BNP   Roxan Hockey M.D on 12/06/2017 at 5:41 PM  Between 7am to 7pm - Pager - [phone]  After 7pm go to [email or web address] - password  [email or web address]  [facility] -  Office  [phone]   Voice Recognition Viviann Spare dictation system was used to create this note, attempts have been made to correct errors. Please contact the author with questions and/or clarifications.

## 2017-12-06 NOTE — Telephone Encounter (Signed)
See alternate note  

## 2017-12-07 ENCOUNTER — Encounter (HOSPITAL_COMMUNITY): Payer: Self-pay | Admitting: *Deleted

## 2017-12-07 ENCOUNTER — Other Ambulatory Visit: Payer: Self-pay | Admitting: Oncology

## 2017-12-07 ENCOUNTER — Inpatient Hospital Stay (HOSPITAL_COMMUNITY): Payer: BC Managed Care – PPO | Admitting: Critical Care Medicine

## 2017-12-07 ENCOUNTER — Encounter (HOSPITAL_COMMUNITY)
Admission: EM | Disposition: A | Discharge status: Hospice | Payer: Self-pay | Source: Home / Self Care | Attending: Internal Medicine

## 2017-12-07 DIAGNOSIS — K222 Esophageal obstruction: Secondary | ICD-10-CM

## 2017-12-07 DIAGNOSIS — K2210 Ulcer of esophagus without bleeding: Secondary | ICD-10-CM

## 2017-12-07 HISTORY — PX: ESOPHAGOGASTRODUODENOSCOPY (EGD) WITH PROPOFOL: SHX5813

## 2017-12-07 HISTORY — PX: ESOPHAGOGASTRODUODENOSCOPY: SHX1529

## 2017-12-07 LAB — BASIC METABOLIC PANEL
Anion gap: 8 (ref 5–15)
BUN: 26 mg/dL — AB (ref 6–20)
CHLORIDE: 109 mmol/L (ref 101–111)
CO2: 18 mmol/L — AB (ref 22–32)
CREATININE: 3.26 mg/dL — AB (ref 0.44–1.00)
Calcium: 8.3 mg/dL — ABNORMAL LOW (ref 8.9–10.3)
GFR calc Af Amer: 14 mL/min — ABNORMAL LOW (ref >60)
GFR calc non Af Amer: 12 mL/min — ABNORMAL LOW (ref >60)
Glucose, Bld: 222 mg/dL — ABNORMAL HIGH (ref 65–99)
POTASSIUM: 3.1 mmol/L — AB (ref 3.5–5.1)
SODIUM: 135 mmol/L (ref 135–145)

## 2017-12-07 LAB — CBC
HEMATOCRIT: 26.4 % — AB (ref 36.0–46.0)
Hemoglobin: 8.7 g/dL — ABNORMAL LOW (ref 12.0–15.0)
MCH: 27.9 pg (ref 26.0–34.0)
MCHC: 33 g/dL (ref 30.0–36.0)
MCV: 84.6 fL (ref 78.0–100.0)
Platelets: 181 10*3/uL (ref 150–400)
RBC: 3.12 MIL/uL — AB (ref 3.87–5.11)
RDW: 18.8 % — ABNORMAL HIGH (ref 11.5–15.5)
WBC: 8.6 10*3/uL (ref 4.0–10.5)

## 2017-12-07 LAB — URINE CULTURE: Culture: NO GROWTH

## 2017-12-07 SURGERY — ESOPHAGOGASTRODUODENOSCOPY (EGD) WITH PROPOFOL
Anesthesia: Monitor Anesthesia Care

## 2017-12-07 MED ORDER — KCL IN DEXTROSE-NACL 20-5-0.45 MEQ/L-%-% IV SOLN
INTRAVENOUS | Status: DC
Start: 2017-12-07 — End: 2017-12-09
  Administered 2017-12-07 – 2017-12-08 (×3): via INTRAVENOUS
  Filled 2017-12-07 (×4): qty 1000

## 2017-12-07 MED ORDER — SUCRALFATE 1 GM/10ML PO SUSP
1.0000 g | Freq: Three times a day (TID) | ORAL | Status: DC
Start: 2017-12-07 — End: 2017-12-10
  Administered 2017-12-08: 1 g via ORAL
  Filled 2017-12-07 (×4): qty 10

## 2017-12-07 MED ORDER — PROPOFOL 10 MG/ML IV BOLUS
INTRAVENOUS | Status: DC | PRN
  Administered 2017-12-07: 20 mg via INTRAVENOUS

## 2017-12-07 MED ORDER — METOCLOPRAMIDE HCL 5 MG/ML IJ SOLN
5.0000 mg | Freq: Four times a day (QID) | INTRAMUSCULAR | Status: DC
  Administered 2017-12-08 – 2017-12-13 (×23): 5 mg via INTRAVENOUS
  Filled 2017-12-07 (×22): qty 2

## 2017-12-07 MED ORDER — FAMOTIDINE IN NACL 20-0.9 MG/50ML-% IV SOLN
20.0000 mg | Freq: Four times a day (QID) | INTRAVENOUS | Status: DC
  Administered 2017-12-07 – 2017-12-13 (×22): 20 mg via INTRAVENOUS
  Filled 2017-12-07 (×22): qty 50

## 2017-12-07 MED ORDER — SODIUM CHLORIDE 0.9 % IV SOLN
INTRAVENOUS | Status: DC
  Administered 2017-12-07: 500 mL via INTRAVENOUS

## 2017-12-07 MED ORDER — PROPOFOL 500 MG/50ML IV EMUL
INTRAVENOUS | Status: DC | PRN
  Administered 2017-12-07: 150 ug/kg/min via INTRAVENOUS

## 2017-12-07 MED ORDER — PANTOPRAZOLE SODIUM 40 MG PO TBEC
40.0000 mg | DELAYED_RELEASE_TABLET | Freq: Two times a day (BID) | ORAL | Status: DC
  Filled 2017-12-07 (×2): qty 1

## 2017-12-07 SURGICAL SUPPLY — 15 items

## 2017-12-07 NOTE — Progress Notes (Signed)
Patient arrived from her procedure, EGD. Patient is alert and oriented. Head of bed at 30 degrees. Patient denies any pain. Vital signs are stable. Patient is resting comfortably in bed. Will continue to monitor patient. Per patient's son [name], Health Care Power of Shawna Schneider he would like me to continue to hold all medications except for the famotidine.

## 2017-12-07 NOTE — Transfer of Care (Signed)
Immediate Anesthesia Transfer of Care Note  Patient: Shawna Schneider  Procedure(s) Performed: ESOPHAGOGASTRODUODENOSCOPY (EGD) WITH PROPOFOL (N/A )  Patient Location: Endoscopy Unit  Anesthesia Type:MAC  Level of Consciousness: sedated  Airway & Oxygen Therapy: Patient Spontanous Breathing and Patient connected to nasal cannula oxygen  Post-op Assessment: Report given to RN and Post -op Vital signs reviewed and stable  Post vital signs: Reviewed and stable  Last Vitals:  Vitals Value Taken Time  BP 91/67 12/07/2017  2:42 PM  Temp    Pulse 92 12/07/2017  2:42 PM  Resp 28 12/07/2017  2:42 PM  SpO2 95 % 12/07/2017  2:42 PM    Last Pain:  Vitals:   12/07/17 1442  TempSrc: Oral  PainSc:       Patients Stated Pain Goal: 2 (21/11/55 2080)  Complications: No apparent anesthesia complications

## 2017-12-07 NOTE — Op Note (Addendum)
[facility] Patient Name: Shawna Schneider Procedure Date : 12/07/2017 MRN: [id number] Attending MD: Gatha Mayer , MD Date of Birth: 07-12-1935 CSN: [id number] Age: 83 Admit Type: Inpatient Procedure:                Upper GI endoscopy Indications:              Dysphagia Providers:                Gatha Mayer, MD, Burtis Junes, RN, Elspeth Cho                            Tech., Technician, Merrilyn Puma, CRNA Referring MD:              Medicines:                Propofol per Anesthesia, Monitored Anesthesia Care Complications:            No immediate complications. Estimated Blood Loss:     Estimated blood loss: 15 mL requiring treatment                            with thrombin. (Hemospray) Procedure:                Pre-Anesthesia Assessment:                           - Prior to the procedure, a History and Physical                            was performed, and patient medications and                            allergies were reviewed. The patient's tolerance of                            previous anesthesia was also reviewed. The risks                            and benefits of the procedure and the sedation                            options and risks were discussed with the patient.                            All questions were answered, and informed consent                            was obtained. Prior Anticoagulants: The patient has                            taken no previous anticoagulant or antiplatelet                            agents. ASA Grade Assessment: III - A patient with  severe systemic disease. After reviewing the risks                            and benefits, the patient was deemed in                            satisfactory condition to undergo the procedure.                           After obtaining informed consent, the endoscope was                            passed under direct vision. Throughout the                             procedure, the patient's blood pressure, pulse, and                            oxygen saturations were monitored continuously. The                            EG-[id number] ([id number]) scope was introduced through the                            mouth, and advanced to the second part of duodenum.                            The upper GI endoscopy was accomplished without                            difficulty. The patient tolerated the procedure                            well. Scope In: Scope Out: Findings:      One benign-appearing, intrinsic moderate stenosis was found at the       gastroesophageal junction. This stenosis measured 1.4 cm (inner       diameter). The stenosis was traversed. A TTS dilator was passed through       the scope. Dilation with a 15-16.5-18 mm balloon dilator was performed       to 18 mm. The dilation site was examined and showed moderate mucosal       disruption. Estimated blood loss: 15 mL requiring treatment with       thrombin Hemospray useed - persistent but slowing bleeding from mucosal       disruption in distal esophagus - stopped with Hemospray application x 3      LA Grade D (one or more mucosal breaks involving at least 75% of       esophageal circumference) esophagitis with no bleeding was found in the       lower third of the esophagus. Biopsies were taken with a cold forceps       for histology. Verification of patient identification for the specimen       was done. Estimated blood loss was minimal.      A 4 cm hiatal hernia was present.  The exam was otherwise without abnormality.      The cardia and gastric fundus were normal on retroflexion. Impression:               - Benign-appearing esophageal stenosis. Dilated.                            Post-dilation bleeding stopped with Hemospray                           - LA Grade D reflux esophagitis. Biopsied.                           - 4 cm hiatal hernia.                           - The  examination was otherwise normal. Recommendation:           - Return patient to hospital ward for ongoing care.                           - Clear liquid diet.                           - IV famotidine q 6 hours                           BID oral PPI                           If taking po well dc IV famotidine if not try to                            get IV PPI                           Sucralfate 1 g qid                           IV Reglan 5 mg q 6                           Keep HOP > 30 degrees - believe lying in bed and                            other factors (ascites) have led to increased reflux                           Await biopsies though suspect severe reflux                            esophagitis and not infection                           Will see her tomorrow  No anti-coagulation yet                           DCed oral K and added K to IVF Procedure Code(s):        --- Professional ---                           [phone], Esophagogastroduodenoscopy, flexible,                            transoral; with transendoscopic balloon dilation of                            esophagus (less than 30 mm diameter)                           43239, Esophagogastroduodenoscopy, flexible,                            transoral; with biopsy, single or multiple Diagnosis Code(s):        --- Professional ---                           K22.2, Esophageal obstruction                           K21.0, Gastro-esophageal reflux disease with                            esophagitis                           K44.9, Diaphragmatic hernia without obstruction or                            gangrene                           R13.10, Dysphagia, unspecified CPT copyright 2017 American Medical Association. All rights reserved. The codes documented in this report are preliminary and upon coder review may  be revised to meet current compliance requirements. Gatha Mayer, MD 12/07/2017 2:55:23  PM This report has been signed electronically. Number of Addenda: 0

## 2017-12-07 NOTE — Progress Notes (Signed)
I spoke w/ pt and family member about pcn tube. For the time being, I think that it makes sense to leave the drain externally for now. It can be changed q 2 mos (if she survives that long) easily in IR. If stent internalized, it may fail and then anesthetic procedure will be needed to change it cystoscopically. I have asked pt/family to think about that. If they decide on internalization, IR can then do that. I will sign off for now--if further assistance needed call back.

## 2017-12-07 NOTE — Progress Notes (Signed)
Patient is scheduled to go for EGD with dilatation 12/08/1027. Patient's son is currently in the room with her. He requested that we administer no medication until after her procedure. I asked if we could administer Keppra prior to the procedure through her IV as it was due at 1000. Patient's son said he did not want any medication including the Keppra administered because he did not want her to get sick prior to her procedure. Per son's request, Nadeen Landau, her health care power of attorney, I did not administer medications.

## 2017-12-07 NOTE — Anesthesia Preprocedure Evaluation (Signed)
Anesthesia Evaluation  Patient identified by MRN, date of birth, ID band Patient awake    Reviewed: Allergy & Precautions, NPO status , Patient's Chart, lab work & pertinent test results  Airway Mallampati: III  TM Distance: >3 FB Neck ROM: Full    Dental  (+) Dental Advisory Given, Missing, Poor Dentition   Pulmonary pneumonia,     + decreased breath sounds      Cardiovascular hypertension, + Peripheral Vascular Disease  Normal cardiovascular exam Rhythm:Regular Rate:Normal     Neuro/Psych Seizures -,  CVA, Residual Symptoms    GI/Hepatic negative GI ROS, Neg liver ROS, Dysphagia Colon cancer    Endo/Other  Hypothyroidism Obesity   Renal/GU ARFRenal diseaseSolitary kidney      Musculoskeletal negative musculoskeletal ROS (+)   Abdominal   Peds  Hematology  (+) Blood dyscrasia, anemia ,   Anesthesia Other Findings Day of surgery medications reviewed with the patient.  Reproductive/Obstetrics                             Anesthesia Physical Anesthesia Plan  ASA: III  Anesthesia Plan: MAC   Post-op Pain Management:    Induction: Intravenous  PONV Risk Score and Plan: 2 and Propofol infusion, Treatment may vary due to age or medical condition and Ondansetron  Airway Management Planned: Nasal Cannula  Additional Equipment:   Intra-op Plan:   Post-operative Plan:   Informed Consent: I have reviewed the patients History and Physical, chart, labs and discussed the procedure including the risks, benefits and alternatives for the proposed anesthesia with the patient or authorized representative who has indicated his/her understanding and acceptance.   Dental advisory given  Plan Discussed with: CRNA and Anesthesiologist  Anesthesia Plan Comments: (Discussed risks/benefits/alternatives to MAC sedation including need for ventilatory support, hypotension, need for conversion to  general anesthesia.  All patient questions answered.  Patient/guardian wishes to proceed.)        Anesthesia Quick Evaluation

## 2017-12-07 NOTE — Interval H&P Note (Signed)
History and Physical Interval Note:  12/07/2017 1:48 PM  Shawna Schneider  has presented today for surgery, with the diagnosis of dysphagia  The various methods of treatment have been discussed with the patient and family. After consideration of risks, benefits and other options for treatment, the patient has consented to  Procedure(s): ESOPHAGOGASTRODUODENOSCOPY (EGD) WITH PROPOFOL (N/A) as a surgical intervention .  The patient's history has been reviewed, patient examined, no change in status, stable for surgery.  I have reviewed the patient's chart and labs.  Questions were answered to the patient's satisfaction.     [name]

## 2017-12-07 NOTE — Progress Notes (Signed)
I agree w/ eventual internalization of J2 stent to allow for rx of hydro. I'll check on pt later this pm.

## 2017-12-07 NOTE — Progress Notes (Signed)
Patient Demographics:    Shawna Schneider, is a 83 y.o. female, DOB - 03-16-1935, XBJ:[id number]  Admit date - 12/03/2017   Admitting Physician Norval Morton, MD  Outpatient Primary MD for the patient is [name], MD  LOS - 4   Chief Complaint  Patient presents with  . Emesis        Subjective:    Shawna Schneider today has no fevers, no further emesis,  No chest pain, no new concerns, son at bedside questions answered  Assessment  & Plan :    Principal Problem:   Intractable nausea and vomiting Active Problems:   Malignant neoplasm of descending colon ([place])   Metastatic cancer (HCC)   Seizure ([place])   Acute renal failure superimposed on chronic kidney disease (HCC)   Dyspnea   Esophageal dysphagia   Paraesophageal hiatal hernia   Esophageal dysmotility   Esophageal stricture   Ulcerative esophagitis  Brief Summary 83 y.o. female with medical history significant of renal cell carcinoma s/p nephrectomy, HTN, stroke, DVT on lovenox (eliquis d/c after GI bleeding), esophageal stricture, and current metastatic adenocarcinoma of the colon on chemotherapy; who was admitted on 12/03/2017 with persistent nausea vomiting from home previously under hospice care prior to admission.  CT abdomen and pelvis suggest disease progression with new metastatic lesions in the pelvis and lungs, however no bowel obstruction there is concerns about hydronephrosis .  Patient and family has rescinded hospice as of 12/05/2017.. They want full scope of treatment including GI consult for possible EGD with dilatation due to esophageal stricture, as well as neurology and interventional Radiology consult for possible right nephrostomy placement for right-sided hydronephrosis and hydroureter   Plan:- 1)Stage IV Metastatic Colon Cancer-   CT abdomen without bowel obstruction, patient is not a candidate for further chemo or  Radiation therapy Dr. Learta Codding her oncologist  2)Social/Ethics-  Patient and family Shawna Schneider  (son POA) and daughter Shawna Schneider) has rescinded hospice as of 12/05/2017.. They want full scope of treatment including GI consult for possible EGD with dilatation due to esophageal stricture, as well as urology and interventional radiology consult for possible right nephrostomy placement for right-sided hydronephrosis and hydroureter.   hospice services notified.  Hospice input appreciated.  Patient has now rescinded hospice at this time  3)AKI----acute kidney injury on CKD stage III  -     creatinine on admission=3.33 ,   baseline creatinine = 1.8   , creatinine is now= 3.26 (peak was 3.71  ,  Avoid nephrotoxic agents/dehydration/hypotension, most likely related to added hydronephrosis and hydroureter which may be related to ureteral obstruction from metastatic lesions  4)Seizure-continue Keppra, recent MRI of the brain without metastatic lesions  5) chronic anemia of chronic disease-secondary to underlying malignancy and prior chemotherapy, monitor H&H and transfuse as clinically indicated  6)H/o esophageal stricture--patient and family requesting EGD with dilatation, GI consult appreciated, Dr Carlean Purl performed  EGD with dilatation due to esophageal stricture on 12/07/17, ,  7)H/o DVT-right nephrostomy tube placed 12/06/2017 EGD with dilatation on 12/07/17, may restart therapeutic doses of Lovenox on 12/08/2017  8) right-sided hydronephrosis and hydroureter with rising creatinine- urology and interventional radiology consult appreciated, had right nephrostomy placement for right-sided hydronephrosis and hydroureter on 12/06/17, discussed with   urologist  Dr. Alinda Money, Dr Alinda Money spoke w/ pt and family member about pcn tube. For the time being, it makes sense to leave the drain externally for now. It can be changed q 2 mos (if she survives that long) easily in IR. If stent internalized, it may fail and then anesthetic  procedure will be needed to change it cystoscopically. Dr Alinda Money has asked pt/family to think about that. If they decide on internalization, IR can then do that   Code Status : Partial code  Disposition Plan  : May Discharge back home if tolerating oral intake, family may reenrolled in hospice post discharge , if she does not tolerate oral intake may transition to residential hospice for symptom management,  patient and family Shawna Schneider  (son POA) and daughter Shawna Schneider) has rescinded hospice as of 12/05/2017.. They want full scope of treatment  Consults  :  Palliative/Hospice/Gi Service/Urology/IR  Procedures -Right-sided nephrostomy tube placement on 12/06/2017 -EGD with dilatation  12/07/2017  DVT Prophylaxis  :    SCDs    Lab Results  Component Value Date   PLT 181 12/07/2017    Inpatient Medications  Scheduled Meds: . allopurinol  100 mg Oral Daily  . metoCLOPramide (REGLAN) injection  5 mg Intravenous Q6H  . pantoprazole  40 mg Oral BID AC  . sucralfate  1 g Oral TID WC & HS   Continuous Infusions: . dextrose 5 % and 0.45 % NaCl with KCl 20 mEq/L 125 mL/hr at 12/07/17 1815  . famotidine (PEPCID) IV Stopped (12/07/17 1716)  . levETIRAcetam Stopped (12/06/17 2144)   PRN Meds:.acetaminophen **OR** acetaminophen, fentaNYL (SUBLIMAZE) injection, ipratropium-albuterol, lidocaine-prilocaine, metoprolol tartrate, ondansetron **OR** ondansetron (ZOFRAN) IV, prochlorperazine    Anti-infectives (From admission, onward)   Start     Dose/Rate Route Frequency Ordered Stop   12/06/17 1715  ciprofloxacin (CIPRO) IVPB 400 mg     400 mg 200 mL/hr over 60 Minutes Intravenous  Once 12/06/17 1701 12/06/17 1802        Objective:   Vitals:   12/07/17 1500 12/07/17 1505 12/07/17 1510 12/07/17 1515  BP:  123/73  (!) 143/96  Pulse: 97 91 91 88  Resp: (!) 26 18 (!) 27 (!) 24  Temp:    98 F (36.7 C)  TempSrc:    Oral  SpO2: 100% 99% 97% 98%  Weight:      Height:        Wt Readings from  Last 3 Encounters:  12/07/17 81.9 kg (180 lb 8.9 oz)  11/08/17 82.8 kg (182 lb 8.7 oz)  10/07/17 77.7 kg (171 lb 4 oz)    Intake/Output Summary (Last 24 hours) at 12/07/2017 2007 Last data filed at 12/07/2017 1716 Gross per 24 hour  Intake 1650 ml  Output 250 ml  Net 1400 ml    Physical Exam  Gen:- Awake Alert, chronically ill-appearing in no apparent distress  HEENT:- [place].AT, No sclera icterus Neck-Supple Neck,No JVD,.  Lungs-diminished in bases, no wheezing  CV- S1, S2 normal, right chest wall Port-A-Cath site is clean dry and intact Abd-  +ve B.Sounds, Abd Soft, , right-sided nephrostomy tube Extremity/Skin:- warm, dry Psych-affect is appropriate, oriented x3 Neuro-no new focal deficits, no tremors   Data Review:   Micro Results Recent Results (from the past 240 hour(s))  Urine Culture     Status: None   Collection Time: 12/06/17  5:14 PM  Result Value Ref Range Status   Specimen Description URINE, CATHETERIZED SITE NOT SPECIFIED  Final   Special Requests RECEIVED  IN A SYRINGE  Final   Culture   Final    NO GROWTH Performed at [facility], [place] [address] [place], [place] [address]    Report Status 12/07/2017 FINAL  Final    Radiology Reports Ct Abdomen Pelvis Wo Contrast  Result Date: 12/04/2017 CLINICAL DATA:  Nausea, vomiting, and abdominal pain for 3 days. History of colon cancer. Stop chemotherapy about 3 weeks ago. Abdominal distention. EXAM: CT ABDOMEN AND PELVIS WITHOUT CONTRAST TECHNIQUE: Multidetector CT imaging of the abdomen and pelvis was performed following the standard protocol without IV contrast. COMPARISON:  11/04/2017 FINDINGS: Lower chest: Bilateral pulmonary nodules measuring up to 2.4 cm diameter, likely pulmonary metastases. Since the previous study, there has been near complete resolution of pleural effusions and basilar atelectasis. Mild residual atelectasis in the right lung base. Moderately large esophageal hiatal hernia. Hepatobiliary:  No focal liver lesions identified. Liver appears atrophic, possibly cirrhotic. Gallbladder is distended. No wall thickening, stone, or inflammatory infiltration. No bile duct dilatation. Pancreas: Unremarkable. No pancreatic ductal dilatation or surrounding inflammatory changes. Spleen: Normal in size without focal abnormality. Adrenals/Urinary Tract: Left kidney is absent, possibly congenital or postoperative. Multiple hyperdense lesions in the right kidney likely representing hemorrhagic cysts. Right hydronephrosis and hydroureter. Difficult to follow the ureter due to ascites. Cause of obstruction is not identified. This could represent obstruction or reflux. Appearance is new since prior study. Stomach/Bowel: Stomach, small bowel, and colon are mostly decompressed. There is residual contrast material demonstrated throughout the colon. Multiple colonic diverticula with contrast filling. Vascular/Lymphatic: Calcification of the abdominal aorta. No aneurysm. Moderate prominent lymph nodes in the retroperitoneum, some with increased density. These may be metastatic. Reproductive: Limited visualization of the uterus. No obvious pelvic masses. Other: Diffuse abdominal and pelvic ascites with nodular infiltration of the mesentery and peritoneal implants consistent with diffuse peritoneal carcinomatosis. Small midline anterior abdominal wall hernias containing fluid and a small portion of the wall of small bowel. Soft tissue edema throughout the subcutaneous fat. No free air. Emphysema in the subcutaneous soft tissues of the anterior abdominal wall likely representing a site of injection. Musculoskeletal: Degenerative changes in the spine. Vague lucent lesions demonstrated in the right iliac bone and acetabulum may represent degenerative cysts or lucent metastasis. IMPRESSION: 1. Prominent diffuse abdominal and pelvic ascites with nodularity to the omentum and peritoneum consistent with peritoneal carcinomatosis.  Similar to prior study. 2. Probable metastatic pulmonary nodules, retroperitoneal lymph nodes, and possible pelvic bone metastasis. 3. New right hydronephrosis and hydroureter of indeterminate etiology. No stones are identified. 4. Hemorrhagic cysts in the right kidney. 5. Interval improvement of pleural effusions and atelectasis in the lung bases since previous study. 6. Aortic atherosclerosis. 7. Residual contrast material throughout the colon and in colonic diverticula. No evidence of obstruction. 8. Midline anterior abdominal wall hernias, 1 containing fluid and another containing a small portion of the wall of the small bowel loop. No obstruction. 9. Soft tissue edema throughout the subcutaneous fat. Electronically Signed   By: Lucienne Capers M.D.   On: 12/04/2017 00:28   Dg Esophagus  Result Date: 12/01/2017 CLINICAL DATA:  Esophageal dysphagia. Vomiting. History of metastatic colon cancer. EXAM: ESOPHOGRAM/BARIUM SWALLOW TECHNIQUE: Single contrast examination was performed using  thin barium. FLUOROSCOPY TIME:  Fluoroscopy Time:  1 minutes and 36 seconds Radiation Exposure Index (if provided by the fluoroscopic device): 20.6 mGy Number of Acquired Spot Images: 0 COMPARISON:  Chest CT 11/04/2017 FINDINGS: Initial barium swallows demonstrate normal pharyngeal motion with swallowing. No  laryngeal penetration or aspiration. No upper esophageal webs, strictures or diverticuli. Esophageal dysmotility with poor propagation of the primary peristaltic wave and frequent tertiary contractions/esophageal spasm. Distal esophageal stricture suspected just above the esophageal vestibule. Patient could not attempt to swallow the 13 mm barium pill because of vomiting. Small sliding-type hiatal hernia. IMPRESSION: 1. Esophageal dysmotility. 2. Suspect distal esophageal stricture just above the esophageal vestibule. Endoscopy may be helpful for further evaluation and treatment. 3. Small hiatal hernia. Electronically  Signed   By: Marijo Sanes M.D.   On: 12/01/2017 14:44   Dg Abd Acute W/chest  Result Date: 12/03/2017 CLINICAL DATA:  Vomiting with history of colon cancer EXAM: DG ABDOMEN ACUTE W/ 1V CHEST COMPARISON:  Chest CT 11/04/2017 FINDINGS: Accessed, power injectable right chest wall Port-A-Cath follows a right internal jugular vein approach with tip in the lower SVC. There are multiple bilateral pulmonary nodules. Right hemidiaphragm is elevated. No pneumothorax or pleural effusion. There is retained contrast material in the colon. No dilated small bowel. No free intraperitoneal air. IMPRESSION: 1. Multiple pulmonary metastases, as previously demonstrated by chest CT. 2. Retained enteric contrast within the colon. No evidence of small-bowel obstruction. Electronically Signed   By: Ulyses Jarred M.D.   On: 12/03/2017 21:24   Ir Nephrostomy Placement Right  Result Date: 12/06/2017 INDICATION: 83 year old with metastatic colon cancer and acute on chronic renal failure. Recent imaging demonstrates right hydronephrosis. Plan for percutaneous nephrostomy tube placement. Patient has a solitary right kidney due to history of renal cell carcinoma and left nephrectomy. EXAM: RIGHT PERCUTANEOUS NEPHROSTOMY TUBE PLACEMENT WITH ULTRASOUND AND FLUOROSCOPIC GUIDANCE MEDICATIONS: Ciprofloxacin 400 mg; The antibiotic was administered in an appropriate time frame prior to skin puncture. ANESTHESIA/SEDATION: Fentanyl 50 mcg IV; Versed 1.0 mg IV Moderate Sedation Time:  13 minutes The patient was continuously monitored during the procedure by the interventional radiology nurse under my direct supervision. CONTRAST:  10 ml - administered into the collecting system(s) FLUOROSCOPY TIME:  Fluoroscopy Time: 54 seconds, 7 mGy COMPLICATIONS: None immediate. PROCEDURE: The procedure was explained to the patient. The risks and benefits of the procedure were discussed and the patient's questions were addressed. Informed consent was obtained  from the patient. Patient was placed prone. Right flank was prepped and draped in sterile fashion. Maximal barrier sterile technique was utilized including caps, mask, sterile gowns, sterile gloves, sterile drape, hand hygiene and skin antiseptic. Ultrasound demonstrated moderate right hydronephrosis. Skin was anesthetized with 1% lidocaine. 21 gauge needle directed into a lower pole calyx with ultrasound guidance. A 0.018 wire was advanced into the renal collecting system. Accustick dilator set was placed. Tract was dilated over a J wire and a 10 [place] multipurpose drain was advanced into the renal pelvis. Blood tinged urine was removed. Renal collecting system was decompressed based on ultrasound. Catheter was sutured to skin and attached to gravity bag. Urine sample sent for culture. Fluoroscopic and ultrasound images were taken and saved for documentation. FINDINGS: Moderate right hydronephrosis. Multiple renal cortical cysts. Nephrostomy tube positioned in the renal pelvis. IMPRESSION: Successful right percutaneous nephrostomy tube placement with ultrasound and fluoroscopic guidance. Electronically Signed   By: Markus Daft M.D.   On: 12/06/2017 19:15     CBC Recent Labs  Lab 12/03/17 2018 12/04/17 1122 12/05/17 0903 12/07/17 0830  WBC 8.3 21.6* 16.9* 8.6  HGB 10.0* 9.4* 9.2* 8.7*  HCT 31.5* 29.2* 28.1* 26.4*  PLT 316 297 242 181  MCV 86.3 85.9 86.7 84.6  MCH 27.4 27.6 28.4 27.9  MCHC 31.7 32.2 32.7 33.0  RDW 18.6* 18.7* 18.9* 18.8*  LYMPHSABS 1.1  --   --   --   MONOABS 0.6  --   --   --   EOSABS 0.0  --   --   --   BASOSABS 0.0  --   --   --     Chemistries  Recent Labs  Lab 12/03/17 2018 12/04/17 1122 12/05/17 0903 12/06/17 0330 12/07/17 0830  NA 141 141 141 139 135  K 3.5 3.6 3.6 3.5 3.1*  CL 110 111 112* 111 109  CO2 18* 20* 20* 20* 18*  GLUCOSE 115* 139* 117* 98 222*  BUN 18 22* 28* 29* 26*  CREATININE 3.33* 3.25* 3.69* 3.71* 3.26*  CALCIUM 9.1 8.6* 8.7* 8.5* 8.3*    AST 15  --   --   --   --   ALT 7*  --   --   --   --   ALKPHOS 84  --   --   --   --   BILITOT 0.4  --   --   --   --    ------------------------------------------------------------------------------------------------------------------ No results for input(s): CHOL, HDL, LDLCALC, TRIG, CHOLHDL, LDLDIRECT in the last 72 hours.  Lab Results  Component Value Date   HGBA1C 6.5 (H) 05/24/2017   ------------------------------------------------------------------------------------------------------------------ No results for input(s): TSH, T4TOTAL, T3FREE, THYROIDAB in the last 72 hours.  Invalid input(s): FREET3 ------------------------------------------------------------------------------------------------------------------ No results for input(s): VITAMINB12, FOLATE, FERRITIN, TIBC, IRON, RETICCTPCT in the last 72 hours.  Coagulation profile Recent Labs  Lab 12/06/17 0330  INR 1.08    No results for input(s): DDIMER in the last 72 hours.  Cardiac Enzymes No results for input(s): CKMB, TROPONINI, MYOGLOBIN in the last 168 hours.  Invalid input(s): CK ------------------------------------------------------------------------------------------------------------------ No results found for: BNP   Roxan Hockey M.D on 12/07/2017 at 8:07 PM  Between 7am to 7pm - Pager - [phone]  After 7pm go to [email or web address] - password [email or web address]  [facility] -  Office  [phone]   Voice Recognition Viviann Spare dictation system was used to create this note, attempts have been made to correct errors. Please contact the author with questions and/or clarifications.

## 2017-12-07 NOTE — Care Management Note (Signed)
Case Management Note Marvetta Gibbons RN, BSN Unit 4E-Case Manager [phone]  Patient Details  Name: Shawna Schneider MRN: [id number] Date of Birth: 12-10-1934  Subjective/Objective:  Pt admitted with intractable N/V-  Hx of malignant colon CA, hydronephrosis- s/p nephrostomy placement-right.               Action/Plan: PTA pt lived at home with family- was under hospice care with [facility]- at this time pt/famliy have revoked pt's hospice benefit effective 4/28- should pt/family want Hospice care again [facility] may be called again for further assistance- at this time pt/family want full treatment for patient. CM to follow for transition of care needs.    Expected Discharge Date:                  Expected Discharge Plan:     In-House Referral:  Clinical Social Work  Discharge planning Services  CM Consult  Post Acute Care Choice:    Choice offered to:     DME Arranged:    DME Agency:     HH Arranged:    HH Agency:     Status of Service:  In process, will continue to follow  If discussed at Long Length of Stay Meetings, dates discussed:    Discharge Disposition:   Additional Comments:  Dawayne Patricia, RN 12/07/2017, 10:40 AM

## 2017-12-07 NOTE — Anesthesia Postprocedure Evaluation (Signed)
Anesthesia Post Note  Patient: Shawna Schneider  Procedure(s) Performed: ESOPHAGOGASTRODUODENOSCOPY (EGD) WITH PROPOFOL (N/A )     Patient location during evaluation: Endoscopy Anesthesia Type: MAC Level of consciousness: awake and alert Pain management: pain level controlled Vital Signs Assessment: post-procedure vital signs reviewed and stable Respiratory status: spontaneous breathing, nonlabored ventilation, respiratory function stable and patient connected to nasal cannula oxygen Cardiovascular status: stable and blood pressure returned to baseline Postop Assessment: no apparent nausea or vomiting Anesthetic complications: no    Last Vitals:  Vitals:   12/07/17 1510 12/07/17 1515  BP:  (!) 143/96  Pulse: 91 88  Resp: (!) 27 (!) 24  Temp:  36.7 C  SpO2: 97% 98%    Last Pain:  Vitals:   12/07/17 1515  TempSrc: Oral  PainSc:                  Catalina Gravel

## 2017-12-07 NOTE — Anesthesia Procedure Notes (Signed)
Procedure Name: MAC Date/Time: 12/07/2017 2:13 PM Performed by: Wilburn Cornelia, CRNA Pre-anesthesia Checklist: Patient identified, Emergency Drugs available, Suction available, Patient being monitored and Timeout performed Patient Re-evaluated:Patient Re-evaluated prior to induction Oxygen Delivery Method: Nasal cannula Placement Confirmation: positive ETCO2 and breath sounds checked- equal and bilateral Dental Injury: Teeth and Oropharynx as per pre-operative assessment

## 2017-12-07 NOTE — Telephone Encounter (Signed)
Milus Banister, MD  Jeoffrey Massed, RN        She does NOT need the EGD as an outpatient since she is currently admitted, getting EGD today.

## 2017-12-08 ENCOUNTER — Encounter (HOSPITAL_COMMUNITY): Payer: Self-pay | Admitting: General Practice

## 2017-12-08 DIAGNOSIS — R0600 Dyspnea, unspecified: Secondary | ICD-10-CM

## 2017-12-08 DIAGNOSIS — N1330 Unspecified hydronephrosis: Secondary | ICD-10-CM

## 2017-12-08 DIAGNOSIS — T17908A Unspecified foreign body in respiratory tract, part unspecified causing other injury, initial encounter: Secondary | ICD-10-CM

## 2017-12-08 DIAGNOSIS — D649 Anemia, unspecified: Secondary | ICD-10-CM

## 2017-12-08 DIAGNOSIS — C799 Secondary malignant neoplasm of unspecified site: Secondary | ICD-10-CM

## 2017-12-08 DIAGNOSIS — K219 Gastro-esophageal reflux disease without esophagitis: Secondary | ICD-10-CM

## 2017-12-08 DIAGNOSIS — R569 Unspecified convulsions: Secondary | ICD-10-CM

## 2017-12-08 DIAGNOSIS — N183 Chronic kidney disease, stage 3 (moderate): Secondary | ICD-10-CM

## 2017-12-08 LAB — BASIC METABOLIC PANEL
Anion gap: 7 (ref 5–15)
BUN: 25 mg/dL — ABNORMAL HIGH (ref 6–20)
CO2: 20 mmol/L — ABNORMAL LOW (ref 22–32)
Calcium: 8.5 mg/dL — ABNORMAL LOW (ref 8.9–10.3)
Chloride: 112 mmol/L — ABNORMAL HIGH (ref 101–111)
Creatinine, Ser: 2.92 mg/dL — ABNORMAL HIGH (ref 0.44–1.00)
GFR calc Af Amer: 16 mL/min — ABNORMAL LOW (ref >60)
GFR calc non Af Amer: 14 mL/min — ABNORMAL LOW (ref >60)
Glucose, Bld: 103 mg/dL — ABNORMAL HIGH (ref 65–99)
Potassium: 3.6 mmol/L (ref 3.5–5.1)
Sodium: 139 mmol/L (ref 135–145)

## 2017-12-08 NOTE — Progress Notes (Addendum)
          Daily Rounding Note  12/08/2017, 10:36 AM  LOS: 5 days   SUBJECTIVE:   Still c/o burning in esophagus/chest.  This AM regurgitated some broth and juice, prior to this had tolerated po Carafate, which caused burning sensation, chased with water.  Generally feels poorly, tired, weak.    OBJECTIVE:         Vital signs in last 24 hours:    Temp:  [98 F (36.7 C)-99.4 F (37.4 C)] 98.8 F (37.1 C) (05/01 0732) Pulse Rate:  [72-124] 124 (05/01 0732) Resp:  [18-30] 24 (05/01 0732) BP: (91-143)/(67-97) 125/97 (05/01 0732) SpO2:  [95 %-100 %] 100 % (05/01 0732) Weight:  [186 lb 8.2 oz (84.6 kg)] 186 lb 8.2 oz (84.6 kg) (05/01 0549) Last BM Date: 12/07/17(Scant amount) Filed Weights   12/04/17 0218 12/07/17 0445 12/08/17 0549  Weight: 169 lb 5 oz (76.8 kg) 180 lb 8.9 oz (81.9 kg) 186 lb 8.2 oz (84.6 kg)   General: thin, ill, looks exhausted.    Heart: RRR hovering in high 90s/low 100s Chest: no resp distress Abdomen: did not reexamine  Extremities: not examined Neuro/Psych:  Appropriate, more verbal today though still laconic   Lab Results: Recent Labs    12/07/17 0830  WBC 8.6  HGB 8.7*  HCT 26.4*  PLT 181   BMET Recent Labs    12/06/17 0330 12/07/17 0830 12/08/17 0608  NA 139 135 139  K 3.5 3.1* 3.6  CL 111 109 112*  CO2 20* 18* 20*  GLUCOSE 98 222* 103*  BUN 29* 26* 25*  CREATININE 3.71* 3.26* 2.92*  CALCIUM 8.5* 8.3* 8.5*    Studies/Results:   ASSESMENT:   *    Dysphagia.  Sxs persist.   EGD 12/07/2017.  Esophageal stenosis, balloon dilated to 18 mm with some associated bleeding, which stopped after treatment with hemo-spray.  Severe reflux esophagitis, biopsy performed, pathology pending.  4 cm hiatal hernia. IV pepcid, carafate and Reglan added to Protonix on 4/30.   Patient additionally has esophageal dysmotility contributing to dysphagia.  *   History DVT, PE.  Lovenox on hold.  *    Normocytic anemia.  *   Metastatic colon cancer with abdominal carcinomatosis and likely malignant ascites despite chemotherapy (last session at the end of 10/2017).    *   Right hydronephrosis and hydroureter.  Oliguria.  AKI improved.  . 12/06/2017 right nephrostomy tube placement by IR.   PLAN   *  Will need to define timing of when ok to restart AC (Lovenox).  For now leave off.  Leave clears in place for now.   Path report pending.      [name]  12/08/2017, 10:36 AM Phone Palmer Attending   I have taken an interval history, reviewed the chart and examined the patient. I agree with the Advanced Practitioner's note, impression and recommendations.   Seems slightly better  I think I have done as much as I can  Path from esophagus - ulcetation no infection seen HSV stain pending  OK to restart anticoag Tx tomorrow  Am signing off now - reviewed with Dr. Vernie Murders, MD, [facility] 12/08/2017 5:17 PM

## 2017-12-08 NOTE — Progress Notes (Signed)
Patient Heart Rate 128-130. Notified provider. Patient is sitting up in chair.

## 2017-12-08 NOTE — Progress Notes (Addendum)
PROGRESS NOTE  Shawna Schneider:[id number] DOB: [birth year] DOA: 12/03/2017 PCP: [name], MD  HPI  Shawna Schneider is a 83 y.o. year old female with medical history significant for renal cell carcinoma status post nephrectomy, metastatic  adenocarcinoma of the colon, DVT on Lovenox, esophageal stricture, history of stroke, hypertension who presented on 12/03/2017 with intractable nausea and vomiting, abdominal pain and was found to have esophageal stenosis with severe reflux esophagitis and persistent peritoneal carcinomatosis.  Interval History Status post EGD on 4/30 with balloon dilation.  Some incidental esophageal bleeding stopped with hemo spray per GI   ROS: Dysphagia, no shortness of breath, no hematemesis    Subjective Emesis with oral intake today, poor appetite  Assessment/Plan: Principal Problem:   Intractable nausea and vomiting Active Problems:   Malignant neoplasm of descending colon (HCC)   Metastatic cancer (HCC)   Seizure (HCC)   Acute renal failure superimposed on chronic kidney disease (HCC)   Dyspnea   Esophageal dysphagia   Paraesophageal hiatal hernia   Esophageal dysmotility   Esophageal stricture   Ulcerative esophagitis   #Intractable nausea vomiting, multifactorial etiology: diffuse peritoneal carcinomatosis related to malignancy.  Likely exacerbated by dysphagia related to esophageal stenosis and severe esophagitis now status post dilatation by GI on 4/30.  Still minimal improvement in tolerating diet.  - Continue IV Pepcid, Carafate, Reglan, Protonix  #Severe ulcerative esophagitis, stable. S/p EGD dilatation and hemo spay for some bleeding.  Esophageal biopsy consistent with ulcer. - continue H2 blocker and PPI as mentioned above per GI ( now signed off)  #AKI on CKD stage III with right-sided hydronephrosis and hydroureter.  Status post right nephrostomy tube (4/29 by IR) for her hydronephrosis likely related to metastatic lesions.   Peak creatinine 3.33, baseline 1.8, current creatinine improving (2.92).  Urology recommends continuing external tubing for now given ease and change, concern of stent internalized will require anesthesia to change it with cystoscopic intervention  #Stage IV metastatic colon cancer.  No acute bowel obstruction on CT, shows persistent diffuse peritoneal carcinomatosis, likely biggest contributor to carpal nausea vomiting.  Fortunately patient no longer candidate for chemo or radiation therapy per her oncologist.  Patient rescinded hospice status to pursue EGD and nephrostomy tube placement for improvement in symptoms.  Palliative care following  #Chronic anemia, secondary to underlying malignancy, stable.  Monitor CBC  #History of DVT.  Previously on Lovenox.  Has been on hold in setting of EGD/dilatation and nephrostomy tube.  Plan to resume 5/2 with close monitoring  #Seizure. Stable. IV keppra until can tolerate PO    Code Status: Partial Code ( no CPR/defib, intubation ok)  Family Communication: will discuss with son  Disposition Plan: monitor for oral intake, resume lovenox tom0rrow   Consultants:  GI, Urology, IR, Palliative  Procedures:  EGD- 4/30  Nephrostomy tube ( IR) 4/29  Antimicrobials:  none  Cultures:  none  Telemetry:  DVT prophylaxis: Holding home Lovenox due to recent procedures   Objective: Vitals:   12/08/17 0549 12/08/17 0732 12/08/17 1142 12/08/17 1648  BP: 114/83 (!) 125/97 (!) 141/95 132/82  Pulse: 97 (!) 124 92 98  Resp: (!) 28 (!) 24 (!) 22 (!) 25  Temp: 99.3 F (37.4 C) 98.8 F (37.1 C) 98.5 F (36.9 C) 99 F (37.2 C)  TempSrc: Oral Oral Oral Oral  SpO2: 96% 100% 95% 98%  Weight: 84.6 kg (186 lb 8.2 oz)     Height:        Intake/Output  Summary (Last 24 hours) at 12/08/2017 2159 Last data filed at 12/08/2017 1814 Gross per 24 hour  Intake 450 ml  Output 308 ml  Net 142 ml   Filed Weights   12/04/17 0218 12/07/17 0445 12/08/17  0549  Weight: 76.8 kg (169 lb 5 oz) 81.9 kg (180 lb 8.9 oz) 84.6 kg (186 lb 8.2 oz)    Exam:  Constitutional:chronically ill appearing female, in no distress Eyes: EOMI, anicteric, normal conjunctivae Cardiovascular: RRR no MRGs, with no peripheral edema Respiratory: Normal respiratory effort on room air, clear breath sounds  Abdomen: Soft,non-tender, diminished bowel sounds Skin: No rash ulcers, or lesions. Without skin tenting  Neurologic: Grossly no focal neuro deficit. Psychiatric:flat affect. Mental status AAOx3  Data Reviewed: CBC: Recent Labs  Lab 12/03/17 2018 12/04/17 1122 12/05/17 0903 12/07/17 0830  WBC 8.3 21.6* 16.9* 8.6  NEUTROABS 6.6  --   --   --   HGB 10.0* 9.4* 9.2* 8.7*  HCT 31.5* 29.2* 28.1* 26.4*  MCV 86.3 85.9 86.7 84.6  PLT 316 297 242 161   Basic Metabolic Panel: Recent Labs  Lab 12/04/17 1122 12/05/17 0903 12/06/17 0330 12/07/17 0830 12/08/17 0608  NA 141 141 139 135 139  K 3.6 3.6 3.5 3.1* 3.6  CL 111 112* 111 109 112*  CO2 20* 20* 20* 18* 20*  GLUCOSE 139* 117* 98 222* 103*  BUN 22* 28* 29* 26* 25*  CREATININE 3.25* 3.69* 3.71* 3.26* 2.92*  CALCIUM 8.6* 8.7* 8.5* 8.3* 8.5*   GFR: Estimated Creatinine Clearance: 15.8 mL/min (A) (by C-G formula based on SCr of 2.92 mg/dL (H)). Liver Function Tests: Recent Labs  Lab 12/03/17 2018  AST 15  ALT 7*  ALKPHOS 84  BILITOT 0.4  PROT 5.7*  ALBUMIN 2.2*   Recent Labs  Lab 12/03/17 2018  LIPASE 25   No results for input(s): AMMONIA in the last 168 hours. Coagulation Profile: Recent Labs  Lab 12/06/17 0330  INR 1.08   Cardiac Enzymes: No results for input(s): CKTOTAL, CKMB, CKMBINDEX, TROPONINI in the last 168 hours. BNP (last 3 results) No results for input(s): PROBNP in the last 8760 hours. HbA1C: No results for input(s): HGBA1C in the last 72 hours. CBG: No results for input(s): GLUCAP in the last 168 hours. Lipid Profile: No results for input(s): CHOL, HDL, LDLCALC,  TRIG, CHOLHDL, LDLDIRECT in the last 72 hours. Thyroid Function Tests: No results for input(s): TSH, T4TOTAL, FREET4, T3FREE, THYROIDAB in the last 72 hours. Anemia Panel: No results for input(s): VITAMINB12, FOLATE, FERRITIN, TIBC, IRON, RETICCTPCT in the last 72 hours. Urine analysis:    Component Value Date/Time   COLORURINE YELLOW 12/04/2017 0234   APPEARANCEUR CLEAR 12/04/2017 0234   LABSPEC 1.017 12/04/2017 0234   PHURINE 5.0 12/04/2017 0234   GLUCOSEU NEGATIVE 12/04/2017 0234   HGBUR SMALL (A) 12/04/2017 0234   BILIRUBINUR NEGATIVE 12/04/2017 0234   KETONESUR NEGATIVE 12/04/2017 0234   PROTEINUR NEGATIVE 12/04/2017 0234   NITRITE NEGATIVE 12/04/2017 0234   LEUKOCYTESUR NEGATIVE 12/04/2017 0234   Sepsis Labs: @LABRCNTIP (procalcitonin:4,lacticidven:4)  ) Recent Results (from the past 240 hour(s))  Urine Culture     Status: None   Collection Time: 12/06/17  5:14 PM  Result Value Ref Range Status   Specimen Description URINE, CATHETERIZED SITE NOT SPECIFIED  Final   Special Requests RECEIVED IN A SYRINGE  Final   Culture   Final    NO GROWTH Performed at [facility], [address] [place], [place] [address]  Report Status 12/07/2017 FINAL  Final      Studies: No results found.  Scheduled Meds: . allopurinol  100 mg Oral Daily  . metoCLOPramide (REGLAN) injection  5 mg Intravenous Q6H  . pantoprazole  40 mg Oral BID AC  . sucralfate  1 g Oral TID WC & HS    Continuous Infusions: . dextrose 5 % and 0.45 % NaCl with KCl 20 mEq/L 125 mL/hr at 12/08/17 1249  . famotidine (PEPCID) IV 20 mg (12/08/17 2045)  . levETIRAcetam Stopped (12/08/17 1036)     LOS: 5 days     Desiree Hane, MD [facility] Pager [phone]  If 7PM-7AM, please contact night-coverage [email or web address] Password [email or web address] 12/08/2017, 9:59 PM

## 2017-12-09 DIAGNOSIS — N179 Acute kidney failure, unspecified: Secondary | ICD-10-CM

## 2017-12-09 DIAGNOSIS — C186 Malignant neoplasm of descending colon: Secondary | ICD-10-CM

## 2017-12-09 DIAGNOSIS — Z8673 Personal history of transient ischemic attack (TIA), and cerebral infarction without residual deficits: Secondary | ICD-10-CM

## 2017-12-09 DIAGNOSIS — Z86718 Personal history of other venous thrombosis and embolism: Secondary | ICD-10-CM

## 2017-12-09 DIAGNOSIS — R112 Nausea with vomiting, unspecified: Secondary | ICD-10-CM

## 2017-12-09 DIAGNOSIS — D500 Iron deficiency anemia secondary to blood loss (chronic): Secondary | ICD-10-CM

## 2017-12-09 DIAGNOSIS — Z86711 Personal history of pulmonary embolism: Secondary | ICD-10-CM

## 2017-12-09 DIAGNOSIS — R1310 Dysphagia, unspecified: Secondary | ICD-10-CM

## 2017-12-09 DIAGNOSIS — C786 Secondary malignant neoplasm of retroperitoneum and peritoneum: Secondary | ICD-10-CM

## 2017-12-09 DIAGNOSIS — Z8541 Personal history of malignant neoplasm of cervix uteri: Secondary | ICD-10-CM

## 2017-12-09 DIAGNOSIS — Q211 Atrial septal defect: Secondary | ICD-10-CM

## 2017-12-09 LAB — CBC
HCT: 25.1 % — ABNORMAL LOW (ref 36.0–46.0)
HCT: 25.9 % — ABNORMAL LOW (ref 36.0–46.0)
HEMOGLOBIN: 8.6 g/dL — AB (ref 12.0–15.0)
Hemoglobin: 8.2 g/dL — ABNORMAL LOW (ref 12.0–15.0)
MCH: 27.5 pg (ref 26.0–34.0)
MCH: 27.8 pg (ref 26.0–34.0)
MCHC: 32.7 g/dL (ref 30.0–36.0)
MCHC: 33.2 g/dL (ref 30.0–36.0)
MCV: 84.2 fL (ref 78.0–100.0)
MCV: 83.8 fL (ref 78.0–100.0)
PLATELETS: 148 10*3/uL — AB (ref 150–400)
Platelets: 151 10*3/uL (ref 150–400)
RBC: 2.98 MIL/uL — ABNORMAL LOW (ref 3.87–5.11)
RBC: 3.09 MIL/uL — ABNORMAL LOW (ref 3.87–5.11)
RDW: 18.4 % — AB (ref 11.5–15.5)
RDW: 18.2 % — AB (ref 11.5–15.5)
WBC: 6.8 10*3/uL (ref 4.0–10.5)
WBC: 7.1 10*3/uL (ref 4.0–10.5)

## 2017-12-09 LAB — BASIC METABOLIC PANEL
Anion gap: 4 — ABNORMAL LOW (ref 5–15)
Anion gap: 8 (ref 5–15)
BUN: 20 mg/dL (ref 6–20)
BUN: 19 mg/dL (ref 6–20)
CALCIUM: 7.2 mg/dL — AB (ref 8.9–10.3)
CHLORIDE: 117 mmol/L — AB (ref 101–111)
CO2: 16 mmol/L — ABNORMAL LOW (ref 22–32)
CO2: 15 mmol/L — ABNORMAL LOW (ref 22–32)
CREATININE: 2.17 mg/dL — AB (ref 0.44–1.00)
CREATININE: 1.87 mg/dL — AB (ref 0.44–1.00)
Calcium: 6.6 mg/dL — ABNORMAL LOW (ref 8.9–10.3)
Chloride: 107 mmol/L (ref 101–111)
GFR calc Af Amer: 24 mL/min — ABNORMAL LOW (ref >60)
GFR calc Af Amer: 28 mL/min — ABNORMAL LOW (ref >60)
GFR calc non Af Amer: 24 mL/min — ABNORMAL LOW (ref >60)
GFR, EST NON AFRICAN AMERICAN: 20 mL/min — AB (ref >60)
Glucose, Bld: 692 mg/dL (ref 65–99)
Glucose, Bld: 84 mg/dL (ref 65–99)
POTASSIUM: 6.5 mmol/L — AB (ref 3.5–5.1)
Potassium: 3.1 mmol/L — ABNORMAL LOW (ref 3.5–5.1)
SODIUM: 140 mmol/L (ref 135–145)
Sodium: 127 mmol/L — ABNORMAL LOW (ref 135–145)

## 2017-12-09 LAB — GLUCOSE, CAPILLARY: Glucose-Capillary: 109 mg/dL — ABNORMAL HIGH (ref 65–99)

## 2017-12-09 MED ORDER — BOOST / RESOURCE BREEZE PO LIQD CUSTOM: 1.0000 | Freq: Two times a day (BID) | ORAL | Status: DC | PRN

## 2017-12-09 MED ORDER — ENOXAPARIN SODIUM 80 MG/0.8ML ~~LOC~~ SOLN
80.0000 mg | SUBCUTANEOUS | Status: DC
  Administered 2017-12-09 – 2017-12-12 (×4): 80 mg via SUBCUTANEOUS
  Filled 2017-12-09 (×4): qty 0.8

## 2017-12-09 MED ORDER — DEXTROSE-NACL 5-0.45 % IV SOLN
INTRAVENOUS | Status: DC
  Administered 2017-12-09 – 2017-12-11 (×5): via INTRAVENOUS
  Administered 2017-12-12: 125 mL via INTRAVENOUS
  Administered 2017-12-12: 125 mL/h via INTRAVENOUS
  Administered 2017-12-12 – 2017-12-13 (×2): via INTRAVENOUS

## 2017-12-09 MED ORDER — SODIUM CHLORIDE 0.9 % IV SOLN: INTRAVENOUS | Status: DC

## 2017-12-09 MED ORDER — SODIUM CHLORIDE 0.9% FLUSH
10.0000 mL | INTRAVENOUS | Status: DC | PRN
  Administered 2017-12-09 – 2017-12-13 (×2): 10 mL
  Filled 2017-12-09 (×2): qty 40

## 2017-12-09 MED ORDER — POLYVINYL ALCOHOL 1.4 % OP SOLN
1.0000 [drp] | OPHTHALMIC | Status: DC | PRN
  Administered 2017-12-09 – 2017-12-12 (×4): 1 [drp] via OPHTHALMIC
  Filled 2017-12-09: qty 15

## 2017-12-09 NOTE — Progress Notes (Signed)
Initial Nutrition Assessment  DOCUMENTATION CODES:   Non-severe (moderate) malnutrition in context of chronic illness, Obesity unspecified  INTERVENTION:    Boost Breeze po BID prn, each supplement provides 250 kcal and 9 grams of protein  NUTRITION DIAGNOSIS:   Moderate Malnutrition related to chronic illness(metastatic colon cancer) as evidenced by moderate fat depletion, moderate muscle depletion.  GOAL:   Patient will meet greater than or equal to 90% of their needs  MONITOR:   PO intake, Supplement acceptance  REASON FOR ASSESSMENT:   NPO/Clear Liquid Diet    ASSESSMENT:   83 yo female with PMH of HLD, solitary kidney, metastatic colon cancer currently on chemotherapy, HTN, stroke, seizures, GERD, esophageal stricture who was admitted on 4/26 with intractable nausea and vomiting related to dysphagia. S/P EGD and esophageal dilation on 4/30.  Spoke with patient and her son at bedside. Patient has had minimal intake since admission. Prior to admission, she was not able to eat or drink much for a few weeks due to nausea and vomiting. She does not like PO supplements that taste like milk, such as Ensure and Premier Protein shakes. She may tolerate Boost Breeze better. She doesn't want any right now but agreed to try later.   Labs and medications reviewed.   Weight is currently above usual weight of 175 lbs due to volume overload / edema. I/O +9.3 L since admission.   Eventual plans for d/c to residential hospice.  NUTRITION - FOCUSED PHYSICAL EXAM:    Most Recent Value  Orbital Region  Moderate depletion  Upper Arm Region  Moderate depletion  Thoracic and Lumbar Region  Unable to assess  Buccal Region  Moderate depletion  Temple Region  Moderate depletion  Clavicle Bone Region  Moderate depletion  Clavicle and Acromion Bone Region  Moderate depletion  Scapular Bone Region  Unable to assess  Dorsal Hand  Unable to assess  Patellar Region  Unable to assess   Anterior Thigh Region  Unable to assess  Posterior Calf Region  Unable to assess  Edema (RD Assessment)  Unable to assess  Hair  Reviewed  Eyes  Unable to assess  Mouth  Unable to assess  Skin  Reviewed  Nails  Unable to assess       Diet Order:   Diet Order           Diet clear liquid Room service appropriate? Yes; Fluid consistency: Thin  Diet effective now          EDUCATION NEEDS:   No education needs have been identified at this time  Skin:  Skin Assessment: Reviewed RN Assessment  Last BM:  5/1 type 7  Height:   Ht Readings from Last 1 Encounters:  12/04/17 5\' 3"  (1.6 m)    Weight:   Wt Readings from Last 1 Encounters:  12/08/17 186 lb 8.2 oz (84.6 kg)    Ideal Body Weight:  52.3 kg  BMI:  Body mass index is 33.04 kg/m.  Estimated Nutritional Needs:   Kcal:  1600-1800  Protein:  90-110 gm  Fluid:  1.6-1.8 L    Molli Barrows, RD, LDN, [place] Pager [phone] After Hours Pager [phone]

## 2017-12-09 NOTE — Progress Notes (Signed)
IP PROGRESS NOTE  Subjective:   Shawna Schneider is well-known to me with a history of metastatic colon cancer.  She was enrolled in home hospice care prior to this hospital admission.  She presented to the emergency room 12/03/2017 with dysphasia/emesis with inability to tolerate anything by mouth.  She was admitted for further evaluation.  A CT of the abdomen and pelvis on 12/03/2017 revealed diffuse ascites with nodularity of the omentum and peritoneum.  New right hydronephrosis.  Probable lung metastases.  No evidence of bowel obstruction. She underwent placement of a right nephrostomy tube on 12/06/2017.  Gastroenterology was consulted and she was taken to an upper endoscopy 12/07/2017.  A benign-appearing stricture was found at the GE junction.  The area was dilated.  There was bleeding following the dilatation treated with thrombin hemospray.  An area of esophagitis was biopsied.  The pathology revealed an ulcer.   Objective: Vital signs in last 24 hours: Blood pressure (!) 133/102, pulse (!) 121, temperature 98.7 F (37.1 C), temperature source Oral, resp. rate (!) 28, height 5' 3"  (1.6 m), weight 186 lb 8.2 oz (84.6 kg), SpO2 96 %.  Intake/Output from previous day: 05/01 0701 - 05/02 0700 In: 350 [P.O.:100] Out: 301 [Urine:300; Stool:1]  Physical Exam:  HEENT: No thrush Lungs: Moves air bilaterally, end expiratory wheeze bilaterally Cardiac: Regular rate and rhythm Abdomen: Distended, nontender Extremities: 2+ edema throughout the right leg, 1+ edema in the left leg   Portacath/PICC-without erythema  Lab Results: Recent Labs    12/09/17 0300 12/09/17 0420  WBC 6.8 7.1  HGB 8.2* 8.6*  HCT 25.1* 25.9*  PLT 148* 151    BMET Recent Labs    12/09/17 0300 12/09/17 0420  NA 127* 140  K 6.5* 3.1*  CL 107 117*  CO2 16* 15*  GLUCOSE 692* 84  BUN 20 19  CREATININE 2.17* 1.87*  CALCIUM 7.2* 6.6*    Lab Results  Component Value Date   CEA1 15.80 (H) 11/10/2017     Studies/Results: No results found.  Medications: I have reviewed the patient's current medications.  Assessment/Plan:  1.Metastaticadenocarcinoma  Colonoscopy 05/27/2017-left colon mass, biopsy confirmed a tubulovillous adenoma  CTs chest, abdomen, and pelvis 05/28/2017-ascites, carcinomatosis, lung nodules, fullness at the splenic flexure  Biopsy of omental mass 06/02/2017--mucinous adenocarcinoma;positive for cytokeratin 20, cytokeratin 7 and CDX-2; negative for PAX-8 and GATA-3. Findings consistent with a gastrointestinal or pancreatico/biliary primary.  Cycle 1 FOLFOX 06/28/2017  Cycle 2 FOLFOX 07/24/2017  Cycle 3 FOLFOX 08/18/2017  Cycle 4 FOLFOX 09/01/2017  Cycle 5 FOLFOX 09/15/2017  CTs 09/27/2017- increased ascites and omental nodularity, stable and increased pulmonary nodules, increased capsular liver deposit  Cycle 1 FOLFIRI 10/07/2017  Cycle 2 FOLFIRI 10/27/2017 (irinotecan dose reduced) Ms. Therriault appears stable. She will complete a second cycle of FOLFIRI today. Dessie Coma on peripheral blood- MSI-high, not detected  2. 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 GI bleeding after TPA 05/24/2017-resolved  4. Bilateral lower extremity DVTs confirmed on Doppler 05/25/2017--initially onEliquis.Eliquis discontinued due to a GI bleed December 2018.  Negative bilateral lower extremity Doppler 07/17/2017  5. Small PFO  6. Cervical cancer at age 89  7. Papillary renal cell carcinoma 2006, status post left nephrectomy  8.Renal insufficiency. Followed by Dr. Moshe Cipro.  9.Port-A-Cath placement 06/23/2017  10.Left hip/leg pain. Likely related to #1.Resolved 07/12/2017.  11.Hospitalization with GI bleed 07/17/2017 through 07/19/2017.  12.Anemia secondary to GI blood loss, renal failure, chemotherapy. Red cell transfusion 07/13/2017.  13.Right lower  extremity DVT/right upper  lobe pulmonary embolism1/09/2017. On Lovenox.  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  16.History of acute and delayed nausea secondary to chemotherapy.  17.Unresponsive episode/seizure 11/01/2017- maintained on Keppra  18.  Solid/liquid dysphasia-endoscopy 12/07/2017 confirmed a 9 appearing esophageal stenosis, dilated  19.  Acute renal failure secondary to obstruction of the right kidney, status post placement of a percutaneous right nephrostomy tube 12/06/2017-improved   Shawna Schneider has metastatic colon cancer.  She has advanced metastatic disease and is not a candidate for further systemic therapy.  She was enrolled in home hospice care prior to hospital admission. A previous attempt at a palliative paracentesis was unsuccessful secondary to the gelatinous nature of the ascites.  She has been maintained on anticoagulation therapy for treatment of a DVT/pulmonary embolism.  Her overall clinical status has declined significantly over the past month.  She appears to be a candidate for [employer].  Hopefully she will be able to tolerate liquids following the esophageal dilatation.  We should consider the indication for continuing medications not related to comfort.   Recommendations: 1.  Advance to full liquid diet as tolerated 2.  Antiacid regimen as recommended by GI 3.  Discuss transition to full comfort measures with Ms. Buckingham and her family, consider transfer to [facility] versus return to home hospice  4.  Please call Oncology as needed, I will be out until 12/13/2017.  I will follow her with the [facility] hospice program at discharge        LOS: 6 days   Betsy Coder, MD   12/09/2017, 8:09 AM

## 2017-12-09 NOTE — Plan of Care (Signed)
  Problem: Education: Goal: Knowledge of General Education information will improve Outcome: Progressing   Problem: Health Behavior/Discharge Planning: Goal: Ability to manage health-related needs will improve Outcome: Progressing   Problem: Clinical Measurements: Goal: Ability to maintain clinical measurements within normal limits will improve Outcome: Progressing Goal: Respiratory complications will improve Outcome: Progressing Goal: Cardiovascular complication will be avoided Outcome: Progressing

## 2017-12-09 NOTE — Progress Notes (Signed)
Pathology reviewed at the hospital No recall

## 2017-12-09 NOTE — Progress Notes (Signed)
PROGRESS NOTE  ELIABETH SHOFF AOZ:[id number] DOB: 1935/04/04 DOA: 12/03/2017 PCP: [name], MD  HPI  Shawna Schneider is a 83 y.o. year old female with medical history significant for renal cell carcinoma status post nephrectomy, metastatic  adenocarcinoma of the colon, DVT on Lovenox, esophageal stricture, history of stroke, hypertension who presented on 12/03/2017 with intractable nausea and vomiting, abdominal pain and was found to have esophageal stenosis with severe reflux esophagitis and persistent peritoneal carcinomatosis.  Interval History No acute events overnight  ROS: Dysphagia,persistent nausea and occasional vomiting, poor appetite, no shortness of breath, no hematemesis    Subjective Emesis x1 this morning without oral intake.  Assessment/Plan: Principal Problem:   Intractable nausea and vomiting Active Problems:   Malignant neoplasm of descending colon (HCC)   Metastatic cancer (HCC)   Seizure (HCC)   Acute renal failure superimposed on chronic kidney disease (HCC)   Dyspnea   Esophageal dysphagia   Paraesophageal hiatal hernia   Esophageal dysmotility   Esophageal stricture   Ulcerative esophagitis   #Intractable nausea vomiting, multifactorial etiology: diffuse peritoneal carcinomatosis related to malignancy.  Likely exacerbated by dysphagia related to esophageal stenosis and severe esophagitis now status post dilatation by GI on 4/30.  Still minimal improvement in tolerating diet.  Separate conversations with palliative team and myself son understands residential hospice ([facility]) next step if she is unable to tolerate oral intake. Continue IV Pepcid, Carafate, Reglan, Protonix  #Severe ulcerative esophagitis, stable. S/p EGD dilatation .  Esophageal biopsy consistent with ulcer. continue H2 blocker and PPI as mentioned above per GI ( now signed off) back at home  #AKI on CKD stage III with right-sided hydronephrosis and hydroureter.  Status  post right nephrostomy tube (4/29 by IR) for her hydronephrosis likely related to metastatic lesions.  Peak creatinine 3.33, back at baseline 1.8  Urology recommends continuing external tubing for now given ease and change, concern if stent internalized will require anesthesia to change it with cystoscopic intervention  #Stage IV metastatic colon cancer.  No acute bowel obstruction on CT, shows persistent diffuse peritoneal carcinomatosis, likely biggest contributor to intractable nausea/vomiting.  Advanced metastatic disease not a candidate for systemic therapy per oncology..  Patient rescinded hospice status to pursue EGD and nephrostomy tube placement for hopeful improvement in symptoms.  Son and patient considering residential hospice if symptoms persist with poor oral intake  #Chronic anemia, secondary to underlying malignancy, stable.  Monitor CBC  #History of DVT.  Resume Lovenox, closely monitor hgb. Had been on hold in setting of EGD/dilatation and nephrostomy tube placement.    #Seizure. Stable. IV keppra until can tolerate PO    Code Status: Partial Code ( no CPR/defib, intubation ok)  Family Communication: Son at bedside  Disposition Plan: monitor for oral intake very likely will need residential hospice, resume lovenox    Consultants:  GI, Urology, IR, Palliative  Procedures:  EGD- 4/30  Nephrostomy tube ( IR) 4/29  Antimicrobials:  none  Cultures:  none  Telemetry:  DVT prophylaxis: ome Lovenox    Objective: Vitals:   12/09/17 1105 12/09/17 1300 12/09/17 1313 12/09/17 1315  BP: (!) 128/100     Pulse: (!) 128 (!) 134 (!) 124 (!) 102  Resp: (!) 32     Temp: 97.8 F (36.6 C)     TempSrc:      SpO2: 98%     Weight:      Height:        Intake/Output Summary (Last 24  hours) at 12/09/2017 1812 Last data filed at 12/09/2017 1700 Gross per 24 hour  Intake 1612.5 ml  Output 276 ml  Net 1336.5 ml   Filed Weights   12/04/17 0218 12/07/17 0445 12/08/17  0549  Weight: 76.8 kg (169 lb 5 oz) 81.9 kg (180 lb 8.9 oz) 84.6 kg (186 lb 8.2 oz)    Exam:  Constitutional:chronically ill appearing female, in no distress Eyes: EOMI, anicteric, normal conjunctivae Cardiovascular: RRR no MRGs, with no peripheral edema Respiratory: Normal respiratory effort on room air, clear breath sounds  Abdomen: Soft,non-tender, diminished bowel sounds Skin: No rash ulcers, or lesions. Without skin tenting  Neurologic: Grossly no focal neuro deficit. Psychiatric:flat affect. Mental status AAOx3  Data Reviewed: CBC: Recent Labs  Lab 12/03/17 2018 12/04/17 1122 12/05/17 0903 12/07/17 0830 12/09/17 0300 12/09/17 0420  WBC 8.3 21.6* 16.9* 8.6 6.8 7.1  NEUTROABS 6.6  --   --   --   --   --   HGB 10.0* 9.4* 9.2* 8.7* 8.2* 8.6*  HCT 31.5* 29.2* 28.1* 26.4* 25.1* 25.9*  MCV 86.3 85.9 86.7 84.6 84.2 83.8  PLT 316 297 242 181 148* 440   Basic Metabolic Panel: Recent Labs  Lab 12/06/17 0330 12/07/17 0830 12/08/17 0608 12/09/17 0300 12/09/17 0420  NA 139 135 139 127* 140  K 3.5 3.1* 3.6 6.5* 3.1*  CL 111 109 112* 107 117*  CO2 20* 18* 20* 16* 15*  GLUCOSE 98 222* 103* 692* 84  BUN 29* 26* 25* 20 19  CREATININE 3.71* 3.26* 2.92* 2.17* 1.87*  CALCIUM 8.5* 8.3* 8.5* 7.2* 6.6*   GFR: Estimated Creatinine Clearance: 24.7 mL/min (A) (by C-G formula based on SCr of 1.87 mg/dL (H)). Liver Function Tests: Recent Labs  Lab 12/03/17 2018  AST 15  ALT 7*  ALKPHOS 84  BILITOT 0.4  PROT 5.7*  ALBUMIN 2.2*   Recent Labs  Lab 12/03/17 2018  LIPASE 25   No results for input(s): AMMONIA in the last 168 hours. Coagulation Profile: Recent Labs  Lab 12/06/17 0330  INR 1.08   Cardiac Enzymes: No results for input(s): CKTOTAL, CKMB, CKMBINDEX, TROPONINI in the last 168 hours. BNP (last 3 results) No results for input(s): PROBNP in the last 8760 hours. HbA1C: No results for input(s): HGBA1C in the last 72 hours. CBG: Recent Labs  Lab 12/09/17 0414   GLUCAP 109*   Lipid Profile: No results for input(s): CHOL, HDL, LDLCALC, TRIG, CHOLHDL, LDLDIRECT in the last 72 hours. Thyroid Function Tests: No results for input(s): TSH, T4TOTAL, FREET4, T3FREE, THYROIDAB in the last 72 hours. Anemia Panel: No results for input(s): VITAMINB12, FOLATE, FERRITIN, TIBC, IRON, RETICCTPCT in the last 72 hours. Urine analysis:    Component Value Date/Time   COLORURINE YELLOW 12/04/2017 0234   APPEARANCEUR CLEAR 12/04/2017 0234   LABSPEC 1.017 12/04/2017 0234   PHURINE 5.0 12/04/2017 0234   GLUCOSEU NEGATIVE 12/04/2017 0234   HGBUR SMALL (A) 12/04/2017 0234   BILIRUBINUR NEGATIVE 12/04/2017 0234   KETONESUR NEGATIVE 12/04/2017 0234   PROTEINUR NEGATIVE 12/04/2017 0234   NITRITE NEGATIVE 12/04/2017 0234   LEUKOCYTESUR NEGATIVE 12/04/2017 0234   Sepsis Labs: @LABRCNTIP (procalcitonin:4,lacticidven:4)  ) Recent Results (from the past 240 hour(s))  Urine Culture     Status: None   Collection Time: 12/06/17  5:14 PM  Result Value Ref Range Status   Specimen Description URINE, CATHETERIZED SITE NOT SPECIFIED  Final   Special Requests RECEIVED IN A SYRINGE  Final   Culture   Final  NO GROWTH Performed at [facility], [place] [address], [place], [place] [address]    Report Status 12/07/2017 FINAL  Final      Studies: No results found.  Scheduled Meds: . allopurinol  100 mg Oral Daily  . enoxaparin (LOVENOX) injection  80 mg Subcutaneous Q24H  . metoCLOPramide (REGLAN) injection  5 mg Intravenous Q6H  . pantoprazole  40 mg Oral BID AC  . sucralfate  1 g Oral TID WC & HS    Continuous Infusions: . dextrose 5 % and 0.45% NaCl 125 mL/hr at 12/09/17 1456  . famotidine (PEPCID) IV 20 mg (12/09/17 1456)  . levETIRAcetam Stopped (12/09/17 1149)     LOS: 6 days     Desiree Hane, MD [facility] Pager [phone]  If 7PM-7AM, please contact night-coverage [email or web address] Password [facility] 12/09/2017, 6:12 PM

## 2017-12-09 NOTE — Progress Notes (Signed)
Daily Progress Note   Patient Name: Shawna Schneider       Date: 12/09/2017 DOB: [birth year]  Age: 83 y.o. MRN#: [id number] Attending Physician: Desiree Hane, MD Primary Care Physician: [name], MD Admit Date: 12/03/2017  Reason for Consultation/Follow-up: Establishing goals of care and Hospice Evaluation  Subjective: Patient currently receiving antiemetic - reports she just vomited. Tells me she never feels nauseous, sudden onset of vomiting.   Length of Stay: 6  Current Medications: Scheduled Meds:  . allopurinol  100 mg Oral Daily  . metoCLOPramide (REGLAN) injection  5 mg Intravenous Q6H  . pantoprazole  40 mg Oral BID AC  . sucralfate  1 g Oral TID WC & HS    Continuous Infusions: . dextrose 5 % and 0.45% NaCl 125 mL/hr at 12/09/17 0542  . famotidine (PEPCID) IV Stopped (12/09/17 0254)  . levETIRAcetam Stopped (12/09/17 1149)    PRN Meds: acetaminophen **OR** acetaminophen, fentaNYL (SUBLIMAZE) injection, ipratropium-albuterol, lidocaine-prilocaine, metoprolol tartrate, ondansetron **OR** ondansetron (ZOFRAN) IV, prochlorperazine  Physical Exam  Constitutional: She is oriented to person, place, and time. She appears ill.  HENT:  Head: Normocephalic and atraumatic.  Cardiovascular: Regular rhythm. Tachycardia present.  Pulmonary/Chest: Breath sounds normal. No accessory muscle usage. Tachypnea noted. No respiratory distress.  Abdominal: She exhibits distension. Bowel sounds are decreased. There is no tenderness.  Musculoskeletal:       Right lower leg: She exhibits edema.       Left lower leg: She exhibits edema.  Neurological: She is alert and oriented to person, place, and time.  Skin: Skin is warm and dry.            Vital Signs: BP (!) 128/100 (BP Location: Right Arm)    Pulse (!) 102   Temp 97.8 F (36.6 C)   Resp (!) 32   Ht 5' 3"  (1.6 m)   Wt 84.6 kg (186 lb 8.2 oz)   SpO2 98%   BMI 33.04 kg/m  SpO2: SpO2: 98 % O2 Device: O2 Device: Room Air O2 Flow Rate: O2 Flow Rate (L/min): 3 L/min  Intake/output summary:   Intake/Output Summary (Last 24 hours) at 12/09/2017 1327 Last data filed at 12/09/2017 1249 Gross per 24 hour  Intake 400 ml  Output 277 ml  Net 123 ml   LBM: Last BM Date: 12/08/17 Baseline Weight: Weight: 77.6 kg (171 lb) Most recent weight: Weight: 84.6 kg (186 lb 8.2 oz)       Palliative Assessment/Data: PPS 20%    Flowsheet Rows     Most Recent Value  Intake Tab  Referral Department  Hospitalist  Unit at Time of Referral  Med/Surg Unit  Palliative Care Primary Diagnosis  Cancer  Date Notified  12/04/17  Palliative Care Type  Return patient Palliative Care  Reason for referral  Clarify Goals of Care  Date of Admission  12/03/17  Date first seen by Palliative Care  12/06/17  # of days Palliative referral response time  2 Day(s)  # of days IP prior to Palliative referral  1  Clinical Assessment  Psychosocial & Spiritual Assessment  Palliative Care Outcomes      Patient Active Problem List   Diagnosis Date Noted  .  Esophageal stricture   . Ulcerative esophagitis   . Esophageal dysphagia   . Paraesophageal hiatal hernia   . Esophageal dysmotility   . Acute renal failure superimposed on chronic kidney disease ([place]) 12/04/2017  . Dyspnea 12/04/2017  . Intractable nausea and vomiting 12/03/2017  . Palliative care by specialist   . Pressure injury of skin 11/02/2017  . Malnutrition of moderate degree 11/02/2017  . Seizure ([place]) 11/01/2017  . Upper GI bleed 07/17/2017  . 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 Goals of care, counseling/discussion 06/16/2017  . Metastatic cancer ([place])   . Acute deep vein thrombosis (DVT) of distal vein of both lower extremities (HCC)   . Gastrointestinal hemorrhage   .  Malignant neoplasm of descending colon ([place])   . Acute ischemic stroke ([place]) 05/24/2017    Palliative Care Assessment & Plan   HPI: 83 y.o. female  with past medical history of renal cell carcinoma s/p nephrectomy 2006, metastatic colon ca last chemo 10/27/17, HTN, CKD, DVT, HLD, hypothyroidism, multiple strokes who had been on service with [employer].  She continues to have episodes of dysphagia and choking as well as having AKI likely related to obstructive process.  Patient has revoked hospice benefits.  PMT consulted for [facility].  Assessment: I have reviewed medical records including EPIC notes, labs and imaging, received report from bedside RN, assessed the patient and then met at the bedside along with patient's son TJ  to discuss diagnosis prognosis, GOC, EOL wishes, disposition and options  Patient and TJ remember me from last hospitalization. They tell me about how things went after discharge and transition to hospice care.   TJ tells me that his mother has been unable to keep down food or liquid. He also tells me she is unable to return home - needs care in a facility. He tells me he is considering residential hospice but wants to "take things one day at a time". He wants time to see if she is going to be able to keep anything down.   Questions and concerns were addressed. The family was encouraged to call with questions or concerns.   Recommendations/Plan:  Son not yet ready for residential hospice but understands it is likely the next step  PMT will follow along  Continue current regimen for N/V - Could try 0.25 mg ativan IV q4hr PRN for N/V  Code Status:  Limited code - no CPR/defib  Prognosis:   < 6 months, likely less d/t poor PO intake  Discharge Planning:  To Be Determined, son considering residential hospice  Care plan was discussed with Dr. Rowe Pavy, patient, son  Thank you for allowing the Palliative Medicine Team to assist in the care of this patient.   Time In:  1300 Time Out: 1330 Total Time 30 minutes Prolonged Time Billed  no       Greater than 50%  of this time was spent counseling and coordinating care related to the above assessment and plan.  Juel Burrow, DNP, AGNP-C Palliative Medicine Team Team Phone # [phone]

## 2017-12-10 ENCOUNTER — Ambulatory Visit (HOSPITAL_COMMUNITY): Payer: BC Managed Care – PPO

## 2017-12-10 LAB — BASIC METABOLIC PANEL
ANION GAP: 7 (ref 5–15)
BUN: 16 mg/dL (ref 6–20)
CHLORIDE: 115 mmol/L — AB (ref 101–111)
CO2: 16 mmol/L — AB (ref 22–32)
Calcium: 7.4 mg/dL — ABNORMAL LOW (ref 8.9–10.3)
Creatinine, Ser: 1.78 mg/dL — ABNORMAL HIGH (ref 0.44–1.00)
GFR calc non Af Amer: 26 mL/min — ABNORMAL LOW (ref >60)
GFR, EST AFRICAN AMERICAN: 30 mL/min — AB (ref >60)
Glucose, Bld: 93 mg/dL (ref 65–99)
Potassium: 3.2 mmol/L — ABNORMAL LOW (ref 3.5–5.1)
Sodium: 138 mmol/L (ref 135–145)

## 2017-12-10 LAB — CBC
HEMATOCRIT: 23.8 % — AB (ref 36.0–46.0)
HEMOGLOBIN: 7.9 g/dL — AB (ref 12.0–15.0)
MCH: 28 pg (ref 26.0–34.0)
MCHC: 33.2 g/dL (ref 30.0–36.0)
MCV: 84.4 fL (ref 78.0–100.0)
Platelets: 128 10*3/uL — ABNORMAL LOW (ref 150–400)
RBC: 2.82 MIL/uL — AB (ref 3.87–5.11)
RDW: 18.1 % — ABNORMAL HIGH (ref 11.5–15.5)
WBC: 6.3 10*3/uL (ref 4.0–10.5)

## 2017-12-10 MED ORDER — LORAZEPAM 2 MG/ML IJ SOLN
0.2500 mg | INTRAMUSCULAR | Status: DC | PRN
  Administered 2017-12-10 – 2017-12-13 (×4): 0.25 mg via INTRAVENOUS
  Filled 2017-12-10 (×4): qty 1

## 2017-12-10 MED ORDER — TRAZODONE HCL 50 MG PO TABS
50.0000 mg | ORAL_TABLET | Freq: Once | ORAL | Status: DC
  Filled 2017-12-10 (×2): qty 1

## 2017-12-10 MED ORDER — LORAZEPAM 2 MG/ML IJ SOLN: 0.2500 mg | INTRAMUSCULAR | Status: DC | PRN

## 2017-12-10 NOTE — Progress Notes (Signed)
Daily Progress Note   Patient Name: Shawna Schneider       Date: 12/10/2017 DOB: Nov 21, 1934  Age: 83 y.o. MRN#: [id number] Attending Physician: Shawna Hane, MD Primary Care Physician: Shawna Pretty, MD Admit Date: 12/03/2017  Reason for Consultation/Follow-up: Establishing goals of care, Hospice Evaluation and Inpatient hospice referral  Subjective: Laying in bed, tells me she feels much worse.  Length of Stay: 7  Current Medications: Scheduled Meds:  . allopurinol  100 mg Oral Daily  . enoxaparin (LOVENOX) injection  80 mg Subcutaneous Q24H  . metoCLOPramide (REGLAN) injection  5 mg Intravenous Q6H  . pantoprazole  40 mg Oral BID AC  . sucralfate  1 g Oral TID WC & HS    Continuous Infusions: . dextrose 5 % and 0.45% NaCl 125 mL/hr at 12/09/17 1456  . famotidine (PEPCID) IV Stopped (12/10/17 0835)  . levETIRAcetam Stopped (12/09/17 2222)    PRN Meds: acetaminophen **OR** acetaminophen, feeding supplement, fentaNYL (SUBLIMAZE) injection, ipratropium-albuterol, lidocaine-prilocaine, metoprolol tartrate, ondansetron **OR** ondansetron (ZOFRAN) IV, polyvinyl alcohol, prochlorperazine, sodium chloride flush  Physical Exam  Constitutional: She is oriented to person, place, and time. She has a sickly appearance.  HENT:  Head: Normocephalic and atraumatic.  Cardiovascular: Regular rhythm.  Pulmonary/Chest: Breath sounds normal. No accessory muscle usage. No tachypnea. No respiratory distress.  Abdominal: She exhibits distension.  Musculoskeletal:       Right lower leg: She exhibits edema.       Left lower leg: She exhibits edema.  Neurological: She is alert and oriented to person, place, and time.  Skin: Skin is warm and dry.  Psychiatric: She is withdrawn. She exhibits a depressed mood.             Vital Signs: BP 119/83 (BP Location: Left Arm)   Pulse 91   Temp 97.9 F (36.6 C) (Oral)   Resp (!) 23   Ht 5\' 3"  (1.6 m)   Wt 84.6 kg (186 lb 8.2 oz)   SpO2 99%   BMI 33.04 kg/m  SpO2: SpO2: 99 % O2 Device: O2 Device: Room Air O2 Flow Rate: O2 Flow Rate (L/min): 3 L/min  Intake/output summary:   Intake/Output Summary (Last 24 hours) at 12/10/2017 1021 Last data filed at 12/10/2017 0441 Gross per 24 hour  Intake 3127.5 ml  Output 351 ml  Net 2776.5 ml   LBM: Last BM Date: 12/08/17 Baseline Weight: Weight: 77.6 kg (171 lb) Most recent weight: Weight: 84.6 kg (186 lb 8.2 oz)       Palliative Assessment/Data: PPS 20%    Flowsheet Rows     Most Recent Value  Intake Tab  Referral Department  Hospitalist  Unit at Time of Referral  Med/Surg Unit  Palliative Care Primary Diagnosis  Cancer  Date Notified  12/04/17  Palliative Care Type  Return patient Palliative Care  Reason for referral  Clarify Goals of Care  Date of Admission  12/03/17  Date first seen by Palliative Care  12/06/17  # of days Palliative referral response time  2 Day(s)  # of days IP prior to Palliative referral  1  Clinical Assessment  Palliative Performance Scale Score  20%  Psychosocial & Spiritual Assessment  Palliative Care Outcomes  Patient/Family meeting held?  Yes  Who was at the meeting?  patient and son, Shawna Schneider goals of care, Improved non-pain symptom therapy, Counseled regarding hospice      Patient Active Problem List   Diagnosis Date Noted  . Esophageal stricture   . Ulcerative esophagitis   . Esophageal dysphagia   . Paraesophageal hiatal hernia   . Esophageal dysmotility   . Acute renal failure superimposed on chronic kidney disease ([place]) 12/04/2017  . Dyspnea 12/04/2017  . Intractable nausea and vomiting 12/03/2017  . Palliative care by specialist   . Pressure injury of skin 11/02/2017  . Malnutrition of moderate degree 11/02/2017    . Seizure ([place]) 11/01/2017  . Upper GI bleed 07/17/2017  . 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 Goals of care, counseling/discussion 06/16/2017  . Metastatic cancer ([place])   . Acute deep vein thrombosis (DVT) of distal vein of both lower extremities (HCC)   . Gastrointestinal hemorrhage   . Malignant neoplasm of descending colon ([place])   . Acute ischemic stroke ([place]) 05/24/2017    Palliative Care Assessment & Plan   HPI: 83 y.o.femalewith past medical history of renal cell carcinoma s/p nephrectomy 2006, metastatic colon ca last chemo 10/27/17, HTN, CKD, DVT, HLD, hypothyroidism, multiple strokes who had been on service with [employer]. She continues to have episodes of dysphagia and choking as well as having AKI likely related to obstructive process.  Patient has revoked hospice benefits.PMT consulted for [facility].  Assessment: Nursing staff tell me patient is asking to be left alone - refusing lab draws and anything PO. Withdrawn.  Patient tells me "I'm tired and I'm tired of being poked and prodded".  Explains that she feels miserable. Tells me nausea medication is not providing any relief. We discussed adding ativan and see if it offers some relief. I asked her about her desires and she says "well, I want to get better but I don't think that's an option". We discussed shifting focus of care from aggressive medical interventions to comfort and she agrees. Tells me she wants to go to [facility] and focus on comfort and quality of life.  Called son, Shawna Schneider, to discuss conversation I had with his mother. He tells me he knew this was coming just didn't realize it was coming so fast. He agrees with plan to go to [facility] and focus on comfort and quality of life. Also, discussed symptom management with Shawna Schneider.  Discussed with Dr. Lonny Schneider.  Recommendations/Plan:  Social work consult for [facility]  Added 0.25 mg ativan IV q4hr PRN nausea  Discontinue oral meds as patient is  refusing  Goals of Care and Additional Recommendations:  Limitations on Scope of Treatment: Minimize Medications, No Artificial Feeding and No Lab Draws  Code Status:  Limited code - intubation only  Prognosis:   < 2 weeks - no PO intake  Discharge Planning:  Hospice facility  Care plan was discussed with patient, son, bedside RN, Dr. Lonny Schneider  Thank you for allowing the Palliative Medicine Team to assist in the care of this patient.   Time In: 1000 Time Out: 1045 Total Time 45 minutes Prolonged Time Billed  no       Greater than 50%  of this time was spent counseling and coordinating care related to the above assessment and plan.  Juel Burrow, DNP, AGNP-C Palliative Medicine Team Team Phone # [phone]

## 2017-12-10 NOTE — Progress Notes (Signed)
Clinical Social Worker received consult from MD stating that patient and family are interested in residential hospice and they would like Shawna Schneider. CSW reached out to [facility] via phone. [name] stated that she will take the referral and reach out to family as soon as she can. [name] stated at this time [name] not have beds available.CSW will follow patient/family for support and discharge needs.   Rhea Pink, MSW,  [place]

## 2017-12-10 NOTE — Progress Notes (Signed)
PROGRESS NOTE  Shawna Schneider DOB: 08-26-1934 DOA: 12/03/2017 PCP: [name], MD  HPI  Shawna Schneider is a 83 y.o. year old female with medical history significant for renal cell carcinoma status post nephrectomy, metastatic  adenocarcinoma of the colon, DVT on Lovenox, esophageal stricture, history of stroke, hypertension who presented on 12/03/2017 with intractable nausea and vomiting, abdominal pain and was found to have esophageal stenosis with severe reflux esophagitis and persistent peritoneal carcinomatosis.  Interval History Patient and family ready to do residential hospice  ROS: Dysphagia,persistent nausea and occasional vomiting, poor appetite, no shortness of breath, no hematemesis    Subjective Emesis x1 this morning without oral intake.  Assessment/Plan: Principal Problem:   Intractable nausea and vomiting Active Problems:   Malignant neoplasm of descending colon (HCC)   Metastatic cancer (HCC)   Seizure (HCC)   Acute renal failure superimposed on chronic kidney disease (HCC)   Dyspnea   Esophageal dysphagia   Paraesophageal hiatal hernia   Esophageal dysmotility   Esophageal stricture   Ulcerative esophagitis   #Intractable nausea vomiting, multifactorial etiology: diffuse peritoneal carcinomatosis related to malignancy.  Likely exacerbated by dysphagia related to esophageal stenosis and severe esophagitis now status post dilatation by GI on 4/30.  Not eating for past 48 hours because of persistent nausea and occasional vomiting.  Family and patient would like to pursue residential hospice at [facility] place. Appreciate Palliative team help. PRN low dose ativan for nausea. Continue IV Pepcid, Carafate, Reglan, Protonix  #Severe ulcerative esophagitis, stable. S/p EGD dilatation .  Esophageal biopsy consistent with ulcer. continue H2 blocker and PPI as mentioned above per GI ( now signed off)   #AKI on CKD stage III with right-sided  hydronephrosis and hydroureter, improved  Status post right nephrostomy tube (4/29 by IR) for her hydronephrosis likely related to metastatic lesions.  Peak creatinine 3.33, back at baseline 1.8  Urology recommends continuing external tubing for now given ease and change, concern if stent internalized will require anesthesia to change it with cystoscopic intervention  #Stage IV metastatic colon cancer.  No acute bowel obstruction on CT, shows persistent diffuse peritoneal carcinomatosis, likely biggest contributor to intractable nausea/vomiting.  Advanced metastatic disease not a candidate for systemic therapy per oncology..  Patient rescinded hospice status to pursue EGD and nephrostomy tube placement for hopeful improvement in symptoms now desires residential hospice  #Chronic anemia, secondary to underlying malignancy, stable.  Monitor CBC  #History of DVT.  Resumed Lovenox yesterday, hgb stable. Discontinued further blood draws given desire for residential hospice.   #Seizure. Stable. IV keppra unable to tolerate PO    Code Status: Partial Code ( no CPR/defib, intubation ok)  Family Communication: Son at bedside  Disposition Plan: awaiting bed availability at [facility](residential hospice)  Consultants:  GI, Urology, IR, Palliative  Procedures:  EGD- 4/30  Nephrostomy tube ( IR) 4/29  Antimicrobials:  none  Cultures:  none  Telemetry:  DVT prophylaxis: ome Lovenox    Objective: Vitals:   12/10/17 0400 12/10/17 0439 12/10/17 0742 12/10/17 1142  BP: (!) 134/93 (!) 130/93 119/83 (!) 123/95  Pulse: 90  91 (!) 124  Resp: (!) 26  (!) 23 (!) 27  Temp:  98.7 F (37.1 C) 97.9 F (36.6 C) 98.2 F (36.8 C)  TempSrc:  Oral Oral Oral  SpO2: 98%  99% 99%  Weight:      Height:        Intake/Output Summary (Last 24 hours) at 12/10/2017 1647 Last  data filed at 12/10/2017 0441 Gross per 24 hour  Intake 1765 ml  Output 250 ml  Net 1515 ml   Filed Weights   12/04/17  0218 12/07/17 0445 12/08/17 0549  Weight: 76.8 kg (169 lb 5 oz) 81.9 kg (180 lb 8.9 oz) 84.6 kg (186 lb 8.2 oz)    Exam:  Constitutional:chronically ill appearing female, in no distress Eyes: EOMI, anicteric, normal conjunctivae Respiratory: Normal respiratory effort on room air  Skin: No rash ulcers, or lesions. Dry Neurologic: Grossly no focal neuro deficit. Psychiatric:flat affect. Mental status AAOx3  Data Reviewed: CBC: Recent Labs  Lab 12/03/17 2018  12/05/17 0903 12/07/17 0830 12/09/17 0300 12/09/17 0420 12/10/17 0730  WBC 8.3   < > 16.9* 8.6 6.8 7.1 6.3  NEUTROABS 6.6  --   --   --   --   --   --   HGB 10.0*   < > 9.2* 8.7* 8.2* 8.6* 7.9*  HCT 31.5*   < > 28.1* 26.4* 25.1* 25.9* 23.8*  MCV 86.3   < > 86.7 84.6 84.2 83.8 84.4  PLT 316   < > 242 181 148* 151 128*   < > = values in this interval not displayed.   Basic Metabolic Panel: Recent Labs  Lab 12/07/17 0830 12/08/17 0608 12/09/17 0300 12/09/17 0420 12/10/17 0730  NA 135 139 127* 140 138  K 3.1* 3.6 6.5* 3.1* 3.2*  CL 109 112* 107 117* 115*  CO2 18* 20* 16* 15* 16*  GLUCOSE 222* 103* 692* 84 93  BUN 26* 25* 20 19 16   CREATININE 3.26* 2.92* 2.17* 1.87* 1.78*  CALCIUM 8.3* 8.5* 7.2* 6.6* 7.4*   GFR: Estimated Creatinine Clearance: 26 mL/min (A) (by C-G formula based on SCr of 1.78 mg/dL (H)). Liver Function Tests: Recent Labs  Lab 12/03/17 2018  AST 15  ALT 7*  ALKPHOS 84  BILITOT 0.4  PROT 5.7*  ALBUMIN 2.2*   Recent Labs  Lab 12/03/17 2018  LIPASE 25   No results for input(s): AMMONIA in the last 168 hours. Coagulation Profile: Recent Labs  Lab 12/06/17 0330  INR 1.08   Cardiac Enzymes: No results for input(s): CKTOTAL, CKMB, CKMBINDEX, TROPONINI in the last 168 hours. BNP (last 3 results) No results for input(s): PROBNP in the last 8760 hours. HbA1C: No results for input(s): HGBA1C in the last 72 hours. CBG: Recent Labs  Lab 12/09/17 0414  GLUCAP 109*   Lipid  Profile: No results for input(s): CHOL, HDL, LDLCALC, TRIG, CHOLHDL, LDLDIRECT in the last 72 hours. Thyroid Function Tests: No results for input(s): TSH, T4TOTAL, FREET4, T3FREE, THYROIDAB in the last 72 hours. Anemia Panel: No results for input(s): VITAMINB12, FOLATE, FERRITIN, TIBC, IRON, RETICCTPCT in the last 72 hours. Urine analysis:    Component Value Date/Time   COLORURINE YELLOW 12/04/2017 0234   APPEARANCEUR CLEAR 12/04/2017 0234   LABSPEC 1.017 12/04/2017 0234   PHURINE 5.0 12/04/2017 0234   GLUCOSEU NEGATIVE 12/04/2017 0234   HGBUR SMALL (A) 12/04/2017 0234   BILIRUBINUR NEGATIVE 12/04/2017 0234   KETONESUR NEGATIVE 12/04/2017 0234   PROTEINUR NEGATIVE 12/04/2017 0234   NITRITE NEGATIVE 12/04/2017 0234   LEUKOCYTESUR NEGATIVE 12/04/2017 0234   Sepsis Labs: @LABRCNTIP (procalcitonin:4,lacticidven:4)  ) Recent Results (from the past 240 hour(s))  Urine Culture     Status: None   Collection Time: 12/06/17  5:14 PM  Result Value Ref Range Status   Specimen Description URINE, CATHETERIZED SITE NOT SPECIFIED  Final   Special Requests RECEIVED IN  A SYRINGE  Final   Culture   Final    NO GROWTH Performed at [facility], [place] [address], [place], [place] [address]    Report Status 12/07/2017 FINAL  Final      Studies: No results found.  Scheduled Meds: . enoxaparin (LOVENOX) injection  80 mg Subcutaneous Q24H  . metoCLOPramide (REGLAN) injection  5 mg Intravenous Q6H    Continuous Infusions: . dextrose 5 % and 0.45% NaCl 125 mL/hr at 12/10/17 1612  . famotidine (PEPCID) IV 20 mg (12/10/17 1608)  . levETIRAcetam Stopped (12/10/17 1055)     LOS: 7 days     Desiree Hane, MD [facility] Pager [phone]  If 7PM-7AM, please contact night-coverage [email or web address] Password [facility] 12/10/2017, 4:47 PM

## 2017-12-10 NOTE — Progress Notes (Signed)
[facility] Liaison:  RN  Received request from [place], [place], for family interest in [facility]  Chart being reviewed.  Spoke with [name], son, to acknowledge referral.  Unfortunately, [facility] is not able to offer a room today.  Family and CSW are aware that [facility] liaison will follow up with CSW and family tomorrow or sooner if room becomes available.  Please do not hesitate to call with questions.     Thank you for this referral,    Edyth Gunnels, RN, [facility] Liaison [phone]  All hospital liaisons are now on [place].

## 2017-12-11 NOTE — Progress Notes (Signed)
Palliative Medicine RN Note: No beds at [facility] today. Checked on symptoms and code status.  Patient reports pain is controlled. No nausea at this moment but requires IV Reglan scheduled to manage it. She also continues to need IV Keppra.  Shawna Schneider is a partial code. I discussed this with her and her son in light of [facility] being unable to take patients with full or partial code status, as they cannot provide that care. They verbalized understanding and were already prepared to change code status to DNR at time of transfer to [facility]. She is adamant that she remain a partial code while at [facility]. I discussed this with Dr Lonny Prude, who will change the code status at the time of discharge.  At Shawna Saylor's request, I also obtained order to skip VS if she is asleep to allow her to rest.   I will up date [name] regarding code status clarification.   [name], RN, BSN, [facility] Palliative Medicine Team 12/11/2017 1:35 PM Office [phone]

## 2017-12-11 NOTE — Social Work (Signed)
CSW spoke with [facility] liaison, aware that beds are still not available at [facility] today.   CSW was alerted to the fact that pt is still a Partial Code. [facility] is only able to take pts who's code status is DNR. CSW will leave message with PMT who have been following this pt in order to discuss this need if the care plan continues to be for [facility].   Alexander Mt, [place] Work [phone]

## 2017-12-11 NOTE — Progress Notes (Signed)
[facility] Liaison: RN note  Spoke with patient and son, Shawna Schneider at bedside to advise them there is no availability today for [facility]. CSW is aware. Family and CSW are aware that [facility] liaison will follow up with CSW and family tomorrow or sooner if room becomes available.  Please do not hesitate to call with questions.  Thank you,  Farrel Gordon, RN, [facility] Liaison [place] are on [facility]

## 2017-12-11 NOTE — Progress Notes (Signed)
PROGRESS NOTE  Shawna Schneider:[id number] DOB: 1935/02/28 DOA: 12/03/2017 PCP: [name], MD  HPI  Shawna Schneider is a 83 y.o. year old female with medical history significant for renal cell carcinoma status post nephrectomy, metastatic  adenocarcinoma of the colon, DVT on Lovenox, esophageal stricture, history of stroke, hypertension who presented on 12/03/2017 with intractable nausea and vomiting, abdominal pain and was found to have esophageal stenosis with severe reflux esophagitis and persistent peritoneal carcinomatosis.  Interval History No acute events overnight    Subjective [place] multiple episodes of vomiting, poor appetite, no oral intake.  Assessment/Plan: Principal Problem:   Intractable nausea and vomiting Active Problems:   Malignant neoplasm of descending colon (HCC)   Metastatic cancer (HCC)   Seizure (HCC)   Acute renal failure superimposed on chronic kidney disease (HCC)   Dyspnea   Esophageal dysphagia   Paraesophageal hiatal hernia   Esophageal dysmotility   Esophageal stricture   Ulcerative esophagitis   #Intractable nausea vomiting, multifactorial etiology: Related to Diffuse peritoneal carcinomatosis and severe esophagitis.  Has had no improvement in symptoms despite EGD dilatation IV Pepcid and PPI, Reglan, Carafate.  Patient currently awaiting bed availability because place for hospice care.  #Severe ulcerative esophagitis, stable. S/p EGD dilatation .  Esophageal biopsy consistent with ulcer. continue H2 blocker and PPI as mentioned above per GI ( now signed off)   #AKI on CKD stage III with right-sided hydronephrosis and hydroureter, improved  Status post right nephrostomy tube (4/29 by IR) for her hydronephrosis likely related to metastatic lesions.  Peak creatinine 3.33, back at baseline 1.8  Urology recommends continuing external tubing for now given ease and change, concern if stent internalized will require anesthesia to change it  with cystoscopic intervention  #Stage IV metastatic colon cancer.  No acute bowel obstruction on CT, shows persistent diffuse peritoneal carcinomatosis, likely biggest contributor to intractable nausea/vomiting.  Advanced metastatic disease not a candidate for systemic therapy per oncology..  Patient rescinded hospice status to pursue EGD and nephrostomy tube placement for hopeful improvement in symptoms now desires residential hospice  #Chronic anemia, secondary to underlying malignancy, stable.  Monitor CBC  #History of DVT.  Resumed Lovenox yesterday, hgb stable. Discontinued further blood draws given desire for residential hospice.   #Seizure. Stable. IV keppra unable to tolerate PO    Code Status: Maintain partial Code ( no CPR/defib, intubation ok) while in-house at [facility], prepared to change to DNR was accepted to [facility]  Family Communication: No family at bedside Disposition Plan: awaiting bed availability at [facility](residential hospice)  Consultants:  GI, Urology, IR, Palliative  Procedures:  EGD- 4/30  Nephrostomy tube ( IR) 4/29  Antimicrobials:  none  Cultures:  none  Telemetry:  DVT prophylaxis: ome Lovenox    Objective: Vitals:   12/11/17 0312 12/11/17 0748 12/11/17 0800 12/11/17 1246  BP: (!) 131/96 109/83  115/78  Pulse: (!) 108 (!) 115  95  Resp: (!) 29 (!) 22 (!) 30 (!) 27  Temp: 98.9 F (37.2 C) (!) 97.5 F (36.4 C)  98.2 F (36.8 C)  TempSrc: Oral Oral  Oral  SpO2: 100% 100%  100%  Weight:      Height:        Intake/Output Summary (Last 24 hours) at 12/11/2017 1617 Last data filed at 12/11/2017 0053 Gross per 24 hour  Intake 100 ml  Output 500 ml  Net -400 ml   Filed Weights   12/04/17 0218 12/07/17 0445 12/08/17 9937  Weight: 76.8 kg (169 lb 5 oz) 81.9 kg (180 lb 8.9 oz) 84.6 kg (186 lb 8.2 oz)    Exam:  Constitutional:chronically ill appearing female, in no distress Eyes: EOMI, anicteric, normal  conjunctivae Respiratory: Normal respiratory effort on room air  Skin: No rash ulcers, or lesions. Dry Neurologic: Grossly no focal neuro deficit. Psychiatric:flat affect. Mental status AAOx3  Data Reviewed: CBC: Recent Labs  Lab 12/05/17 0903 12/07/17 0830 12/09/17 0300 12/09/17 0420 12/10/17 0730  WBC 16.9* 8.6 6.8 7.1 6.3  HGB 9.2* 8.7* 8.2* 8.6* 7.9*  HCT 28.1* 26.4* 25.1* 25.9* 23.8*  MCV 86.7 84.6 84.2 83.8 84.4  PLT 242 181 148* 151 102*   Basic Metabolic Panel: Recent Labs  Lab 12/07/17 0830 12/08/17 0608 12/09/17 0300 12/09/17 0420 12/10/17 0730  NA 135 139 127* 140 138  K 3.1* 3.6 6.5* 3.1* 3.2*  CL 109 112* 107 117* 115*  CO2 18* 20* 16* 15* 16*  GLUCOSE 222* 103* 692* 84 93  BUN 26* 25* 20 19 16   CREATININE 3.26* 2.92* 2.17* 1.87* 1.78*  CALCIUM 8.3* 8.5* 7.2* 6.6* 7.4*   GFR: Estimated Creatinine Clearance: 26 mL/min (A) (by C-G formula based on SCr of 1.78 mg/dL (H)). Liver Function Tests: No results for input(s): AST, ALT, ALKPHOS, BILITOT, PROT, ALBUMIN in the last 168 hours. No results for input(s): LIPASE, AMYLASE in the last 168 hours. No results for input(s): AMMONIA in the last 168 hours. Coagulation Profile: Recent Labs  Lab 12/06/17 0330  INR 1.08   Cardiac Enzymes: No results for input(s): CKTOTAL, CKMB, CKMBINDEX, TROPONINI in the last 168 hours. BNP (last 3 results) No results for input(s): PROBNP in the last 8760 hours. HbA1C: No results for input(s): HGBA1C in the last 72 hours. CBG: Recent Labs  Lab 12/09/17 0414  GLUCAP 109*   Lipid Profile: No results for input(s): CHOL, HDL, LDLCALC, TRIG, CHOLHDL, LDLDIRECT in the last 72 hours. Thyroid Function Tests: No results for input(s): TSH, T4TOTAL, FREET4, T3FREE, THYROIDAB in the last 72 hours. Anemia Panel: No results for input(s): VITAMINB12, FOLATE, FERRITIN, TIBC, IRON, RETICCTPCT in the last 72 hours. Urine analysis:    Component Value Date/Time   COLORURINE YELLOW  12/04/2017 0234   APPEARANCEUR CLEAR 12/04/2017 0234   LABSPEC 1.017 12/04/2017 0234   PHURINE 5.0 12/04/2017 0234   GLUCOSEU NEGATIVE 12/04/2017 0234   HGBUR SMALL (A) 12/04/2017 0234   BILIRUBINUR NEGATIVE 12/04/2017 0234   KETONESUR NEGATIVE 12/04/2017 0234   PROTEINUR NEGATIVE 12/04/2017 0234   NITRITE NEGATIVE 12/04/2017 0234   LEUKOCYTESUR NEGATIVE 12/04/2017 0234   Sepsis Labs: @LABRCNTIP (procalcitonin:4,lacticidven:4)  ) Recent Results (from the past 240 hour(s))  Urine Culture     Status: None   Collection Time: 12/06/17  5:14 PM  Result Value Ref Range Status   Specimen Description URINE, CATHETERIZED SITE NOT SPECIFIED  Final   Special Requests RECEIVED IN A SYRINGE  Final   Culture   Final    NO GROWTH Performed at [facility], [address], [place], [place] [address]    Report Status 12/07/2017 FINAL  Final      Studies: No results found.  Scheduled Meds: . enoxaparin (LOVENOX) injection  80 mg Subcutaneous Q24H  . metoCLOPramide (REGLAN) injection  5 mg Intravenous Q6H  . traZODone  50 mg Oral Once    Continuous Infusions: . dextrose 5 % and 0.45% NaCl 125 mL/hr at 12/11/17 1534  . famotidine (PEPCID) IV Stopped (12/11/17 1551)  . levETIRAcetam Stopped (  12/11/17 1002)     LOS: 8 days     Desiree Hane, MD [facility] Pager [phone]  If 7PM-7AM, please contact night-coverage [email or web address] Password [facility] 12/11/2017, 4:17 PM

## 2017-12-11 NOTE — Progress Notes (Signed)
Patient had gradual worsening of nausea and vomiting this afternoon with 3 episodes of greenish, scant vomiting.  Compazine and Reglan given to control nausea.

## 2017-12-12 NOTE — Progress Notes (Signed)
PROGRESS NOTE  Shawna Schneider PFX:[id number] DOB: 10/13/1934 DOA: 12/03/2017 PCP: [name], MD  HPI  Shawna Schneider is a 83 y.o. year old female with medical history significant for renal cell carcinoma status post nephrectomy, metastatic  adenocarcinoma of the colon, DVT on Lovenox, esophageal stricture, history of stroke, hypertension who presented on 12/03/2017 with intractable nausea and vomiting, abdominal pain and was found to have esophageal stenosis with severe reflux esophagitis and persistent peritoneal carcinomatosis.  Interval History No acute events overnight    Subjective Still multiple episodes of vomiting.  Asleep during today's exam.  Assessment/Plan: Principal Problem:   Intractable nausea and vomiting Active Problems:   Malignant neoplasm of descending colon (HCC)   Metastatic cancer (HCC)   Seizure (HCC)   Acute renal failure superimposed on chronic kidney disease (HCC)   Dyspnea   Esophageal dysphagia   Paraesophageal hiatal hernia   Esophageal dysmotility   Esophageal stricture   Ulcerative esophagitis   #Intractable nausea vomiting, multifactorial etiology: Related to Diffuse peritoneal carcinomatosis and severe esophagitis.  Has had no improvement in symptoms despite EGD dilatation and IV PPI/good motility supportive care as recommended by GI.  Awaiting bed availability residential hospice at [facility].  Appreciate palliative medicine social worker support.    #Severe ulcerative esophagitis, stable. S/p EGD dilatation .  Esophageal biopsy consistent with ulcer. continue H2 blocker and PPI as mentioned above per GI ( now signed off)   #AKI on CKD stage III with right-sided hydronephrosis and hydroureter, improved  Status post right nephrostomy tube (4/29 by IR) for her hydronephrosis likely related to metastatic lesions.  Peak creatinine 3.33, back at baseline 1.8  Urology recommends continuing external tubing for now given ease and change,  concern if stent internalized will require anesthesia to change it with cystoscopic intervention  #Stage IV metastatic colon cancer.  No acute bowel obstruction on CT, shows persistent diffuse peritoneal carcinomatosis, likely biggest contributor to intractable nausea/vomiting.  Advanced metastatic disease not a candidate for systemic therapy per oncology..  Patient rescinded hospice status to pursue EGD and nephrostomy tube placement for hopeful improvement in symptoms now desires residential hospice  #Chronic anemia, secondary to underlying malignancy, stable.  Monitor CBC  #History of DVT.  Resumed Lovenox yesterday, hgb stable. Discontinued further blood draws given desire for residential hospice.   #Seizure. Stable. IV keppra unable to tolerate PO    Code Status: Maintain partial Code ( no CPR/defib, intubation ok) while in-house at [facility], prepared to change to DNR was accepted to [facility] placed  Family Communication: No family at bedside Disposition Plan: awaiting bed availability at [facility](residential hospice)  Consultants:  GI, Urology, IR, Palliative  Procedures:  EGD- 4/30  Nephrostomy tube ( IR) 4/29  Antimicrobials:  none  Cultures:  none  Telemetry:  DVT prophylaxis: ome Lovenox    Objective: Vitals:   12/12/17 0016 12/12/17 0627 12/12/17 0800 12/12/17 1200  BP: (!) 130/100 (!) 130/97 (!) 112/92 (!) 125/103  Pulse: (!) 109 (!) 108 (!) 125 100  Resp: (!) 25 (!) 22 (!) 27 (!) 28  Temp: 98.1 F (36.7 C) 97.9 F (36.6 C)    TempSrc: Oral Oral    SpO2: 100% 100% 98% 100%  Weight:  84 kg (185 lb 3 oz)    Height:        Intake/Output Summary (Last 24 hours) at 12/12/2017 1426 Last data filed at 12/12/2017 1300 Gross per 24 hour  Intake 210 ml  Output 200 ml  Net 10 ml   Filed Weights   12/07/17 0445 12/08/17 0549 12/12/17 0627  Weight: 81.9 kg (180 lb 8.9 oz) 84.6 kg (186 lb 8.2 oz) 84 kg (185 lb 3 oz)     Exam:  Constitutional:chronically ill appearing female, in no distress Respiratory: Normal respiratory effort on room air  Skin: No rash ulcers, or lesions. Dry Neurologic: Grossly no focal neuro deficit. Psychiatric:flat affect. Mental status AAOx3  Data Reviewed: CBC: Recent Labs  Lab 12/07/17 0830 12/09/17 0300 12/09/17 0420 12/10/17 0730  WBC 8.6 6.8 7.1 6.3  HGB 8.7* 8.2* 8.6* 7.9*  HCT 26.4* 25.1* 25.9* 23.8*  MCV 84.6 84.2 83.8 84.4  PLT 181 148* 151 109*   Basic Metabolic Panel: Recent Labs  Lab 12/07/17 0830 12/08/17 0608 12/09/17 0300 12/09/17 0420 12/10/17 0730  NA 135 139 127* 140 138  K 3.1* 3.6 6.5* 3.1* 3.2*  CL 109 112* 107 117* 115*  CO2 18* 20* 16* 15* 16*  GLUCOSE 222* 103* 692* 84 93  BUN 26* 25* 20 19 16   CREATININE 3.26* 2.92* 2.17* 1.87* 1.78*  CALCIUM 8.3* 8.5* 7.2* 6.6* 7.4*   GFR: Estimated Creatinine Clearance: 25.9 mL/min (A) (by C-G formula based on SCr of 1.78 mg/dL (H)). Liver Function Tests: No results for input(s): AST, ALT, ALKPHOS, BILITOT, PROT, ALBUMIN in the last 168 hours. No results for input(s): LIPASE, AMYLASE in the last 168 hours. No results for input(s): AMMONIA in the last 168 hours. Coagulation Profile: Recent Labs  Lab 12/06/17 0330  INR 1.08   Cardiac Enzymes: No results for input(s): CKTOTAL, CKMB, CKMBINDEX, TROPONINI in the last 168 hours. BNP (last 3 results) No results for input(s): PROBNP in the last 8760 hours. HbA1C: No results for input(s): HGBA1C in the last 72 hours. CBG: Recent Labs  Lab 12/09/17 0414  GLUCAP 109*   Lipid Profile: No results for input(s): CHOL, HDL, LDLCALC, TRIG, CHOLHDL, LDLDIRECT in the last 72 hours. Thyroid Function Tests: No results for input(s): TSH, T4TOTAL, FREET4, T3FREE, THYROIDAB in the last 72 hours. Anemia Panel: No results for input(s): VITAMINB12, FOLATE, FERRITIN, TIBC, IRON, RETICCTPCT in the last 72 hours. Urine analysis:    Component Value  Date/Time   COLORURINE YELLOW 12/04/2017 0234   APPEARANCEUR CLEAR 12/04/2017 0234   LABSPEC 1.017 12/04/2017 0234   PHURINE 5.0 12/04/2017 0234   GLUCOSEU NEGATIVE 12/04/2017 0234   HGBUR SMALL (A) 12/04/2017 0234   BILIRUBINUR NEGATIVE 12/04/2017 0234   KETONESUR NEGATIVE 12/04/2017 0234   PROTEINUR NEGATIVE 12/04/2017 0234   NITRITE NEGATIVE 12/04/2017 0234   LEUKOCYTESUR NEGATIVE 12/04/2017 0234   Sepsis Labs: @LABRCNTIP (procalcitonin:4,lacticidven:4)  ) Recent Results (from the past 240 hour(s))  Urine Culture     Status: None   Collection Time: 12/06/17  5:14 PM  Result Value Ref Range Status   Specimen Description URINE, CATHETERIZED SITE NOT SPECIFIED  Final   Special Requests RECEIVED IN A SYRINGE  Final   Culture   Final    NO GROWTH Performed at [facility], [address] [place], [place] [address]    Report Status 12/07/2017 FINAL  Final      Studies: No results found.  Scheduled Meds: . enoxaparin (LOVENOX) injection  80 mg Subcutaneous Q24H  . metoCLOPramide (REGLAN) injection  5 mg Intravenous Q6H  . traZODone  50 mg Oral Once    Continuous Infusions: . dextrose 5 % and 0.45% NaCl 125 mL (12/12/17 0944)  . famotidine (PEPCID) IV 20 mg (12/12/17 7322)  .  levETIRAcetam 500 mg (12/12/17 1351)     LOS: 9 days     Desiree Hane, MD [facility] Pager [phone]  If 7PM-7AM, please contact night-coverage [email or web address] Password [email or web address] 12/12/2017, 2:26 PM

## 2017-12-13 ENCOUNTER — Inpatient Hospital Stay (HOSPITAL_COMMUNITY): Payer: BC Managed Care – PPO

## 2017-12-13 DIAGNOSIS — J690 Pneumonitis due to inhalation of food and vomit: Secondary | ICD-10-CM

## 2017-12-13 DIAGNOSIS — G893 Neoplasm related pain (acute) (chronic): Secondary | ICD-10-CM

## 2017-12-13 DIAGNOSIS — C189 Malignant neoplasm of colon, unspecified: Secondary | ICD-10-CM

## 2017-12-13 DIAGNOSIS — J9601 Acute respiratory failure with hypoxia: Secondary | ICD-10-CM

## 2017-12-13 DIAGNOSIS — E872 Acidosis: Principal | ICD-10-CM

## 2017-12-13 LAB — BLOOD GAS, VENOUS
Acid-base deficit: 12.5 mmol/L — ABNORMAL HIGH (ref 0.0–2.0)
Bicarbonate: 14.1 mmol/L — ABNORMAL LOW (ref 20.0–28.0)
O2 Saturation: 29.9 %
PCO2 VEN: 38.6 mmHg — AB (ref 44.0–60.0)
PH VEN: 7.189 — AB (ref 7.250–7.430)
Patient temperature: 98.6

## 2017-12-13 MED ORDER — SODIUM BICARBONATE 8.4 % IV SOLN
100.0000 meq | Freq: Once | INTRAVENOUS | Status: AC
  Administered 2017-12-13: 100 meq via INTRAVENOUS

## 2017-12-13 MED ORDER — PANTOPRAZOLE SODIUM 40 MG IV SOLR
40.0000 mg | Freq: Every day | INTRAVENOUS | Status: DC
  Filled 2017-12-13: qty 40

## 2017-12-13 MED ORDER — METOCLOPRAMIDE HCL 5 MG/ML IJ SOLN: 5.0000 mg | Freq: Four times a day (QID) | INTRAMUSCULAR | 0 refills | Status: AC

## 2017-12-13 MED ORDER — HYDROMORPHONE HCL 1 MG/ML IJ SOLN
0.5000 mg | INTRAMUSCULAR | Status: DC | PRN
  Administered 2017-12-13 (×2): 0.5 mg via INTRAVENOUS
  Filled 2017-12-13 (×2): qty 1

## 2017-12-13 MED ORDER — FUROSEMIDE 10 MG/ML IJ SOLN
20.0000 mg | Freq: Once | INTRAMUSCULAR | Status: AC
  Administered 2017-12-13: 20 mg via INTRAVENOUS
  Filled 2017-12-13: qty 2

## 2017-12-13 MED ORDER — SODIUM BICARBONATE 8.4 % IV SOLN
INTRAVENOUS | Status: AC
  Filled 2017-12-13: qty 100

## 2017-12-13 MED ORDER — GLYCOPYRROLATE 0.2 MG/ML IJ SOLN: 0.2000 mg | INTRAMUSCULAR | Status: AC | PRN

## 2017-12-13 MED ORDER — PANTOPRAZOLE SODIUM 40 MG IV SOLR: 40.0000 mg | Freq: Every day | INTRAVENOUS | Status: AC

## 2017-12-13 MED ORDER — ACETAMINOPHEN 10 MG/ML IV SOLN: 1000.0000 mg | Freq: Four times a day (QID) | INTRAVENOUS | Status: AC

## 2017-12-13 MED ORDER — HYDROMORPHONE HCL 1 MG/ML IJ SOLN: 0.5000 mg | INTRAMUSCULAR | 0 refills | Status: AC | PRN

## 2017-12-13 MED ORDER — ACETAMINOPHEN 10 MG/ML IV SOLN
1000.0000 mg | Freq: Four times a day (QID) | INTRAVENOUS | Status: DC
  Administered 2017-12-13 (×2): 1000 mg via INTRAVENOUS
  Filled 2017-12-13 (×3): qty 100

## 2017-12-13 MED ORDER — METOPROLOL TARTRATE 5 MG/5ML IV SOLN: 5.0000 mg | INTRAVENOUS | Status: AC | PRN

## 2017-12-13 MED ORDER — HEPARIN SOD (PORK) LOCK FLUSH 100 UNIT/ML IV SOLN
500.0000 [IU] | INTRAVENOUS | Status: AC | PRN
  Administered 2017-12-13: 500 [IU]

## 2017-12-13 MED ORDER — FENTANYL CITRATE (PF) 100 MCG/2ML IJ SOLN: 25.0000 ug | INTRAMUSCULAR | 0 refills | Status: AC | PRN

## 2017-12-13 MED ORDER — SODIUM CHLORIDE 0.9% FLUSH: 10.0000 mL | INTRAVENOUS | Status: AC | PRN

## 2017-12-13 MED ORDER — LIDOCAINE 5 % EX PTCH
1.0000 | MEDICATED_PATCH | CUTANEOUS | Status: DC
  Administered 2017-12-13: 1 via TRANSDERMAL
  Filled 2017-12-13: qty 1

## 2017-12-13 MED ORDER — LEVETIRACETAM IN NACL 500 MG/100ML IV SOLN: 500.0000 mg | Freq: Two times a day (BID) | INTRAVENOUS | Status: AC

## 2017-12-13 MED ORDER — POLYVINYL ALCOHOL 1.4 % OP SOLN: 1.0000 [drp] | OPHTHALMIC | 0 refills | Status: AC | PRN

## 2017-12-13 MED ORDER — ENOXAPARIN SODIUM 80 MG/0.8ML ~~LOC~~ SOLN: 80.0000 mg | SUBCUTANEOUS | Status: AC

## 2017-12-13 MED ORDER — GLYCOPYRROLATE 0.2 MG/ML IJ SOLN
0.2000 mg | INTRAMUSCULAR | Status: DC | PRN
  Administered 2017-12-13 (×2): 0.2 mg via INTRAVENOUS
  Filled 2017-12-13 (×2): qty 1

## 2017-12-13 MED ORDER — IPRATROPIUM-ALBUTEROL 0.5-2.5 (3) MG/3ML IN SOLN: 3.0000 mL | Freq: Four times a day (QID) | RESPIRATORY_TRACT | Status: AC

## 2017-12-13 MED ORDER — PROCHLORPERAZINE EDISYLATE 10 MG/2ML IJ SOLN: 10.0000 mg | Freq: Four times a day (QID) | INTRAMUSCULAR | Status: AC | PRN

## 2017-12-13 MED ORDER — LIDOCAINE-PRILOCAINE 2.5-2.5 % EX CREA: TOPICAL_CREAM | CUTANEOUS | 0 refills | Status: AC | PRN

## 2017-12-13 MED ORDER — IPRATROPIUM-ALBUTEROL 0.5-2.5 (3) MG/3ML IN SOLN
3.0000 mL | Freq: Four times a day (QID) | RESPIRATORY_TRACT | Status: DC
  Administered 2017-12-13 (×2): 3 mL via RESPIRATORY_TRACT
  Filled 2017-12-13 (×2): qty 3

## 2017-12-13 MED ORDER — LIDOCAINE 5 % EX PTCH: 1.0000 | MEDICATED_PATCH | CUTANEOUS | 0 refills | Status: AC

## 2017-12-13 MED ORDER — LORAZEPAM 2 MG/ML IJ SOLN: 0.2500 mg | INTRAMUSCULAR | 0 refills | Status: AC | PRN

## 2017-12-13 MED ORDER — ONDANSETRON HCL 4 MG/2ML IJ SOLN: 4.0000 mg | Freq: Four times a day (QID) | INTRAMUSCULAR | 0 refills | Status: AC | PRN

## 2017-12-13 MED ORDER — METHYLPREDNISOLONE SODIUM SUCC 40 MG IJ SOLR
40.0000 mg | Freq: Four times a day (QID) | INTRAMUSCULAR | Status: DC
  Administered 2017-12-13 (×2): 40 mg via INTRAVENOUS
  Filled 2017-12-13 (×2): qty 1

## 2017-12-13 NOTE — Consult Note (Addendum)
PULMONARY / CRITICAL CARE MEDICINE   Name: Shawna Schneider MRN: [id number] DOB: June 06, 1935    ADMISSION DATE:  12/03/2017 CONSULTATION DATE: 12/13/17  REFERRING MD:  Dr Lonny Prude  CHIEF COMPLAINT: Respiratory distress  HISTORY OF PRESENT ILLNESS:   82yoF with hx CVA, SZ, HTN, GERD, Renal cell carcinoma, Esophageal strictures, Metastatic adenocarcinoma of the colon with peritoneal mets (for which she was on hospice prior to admission), DVT (on lovenox), who presented on 4/26 with N/V, Abdominal pain, and SOB. She was found to have AKI-on-CKD and Severe reflux esophagitis in addition to her known diagnosis of Esophageal stricture. CT Abdomen showed ascites, peritoneal carcinomatosis, pulmonary/lymph node/bone mets, acute right hydronephrosis, no SBO. Right-sided nephrostomy tube was placed. Patient's prior DNR status was rescinded in order to obtain EGD and Nephrostomy tube placement this admission. Patient's AKI was back to her baseline CKD as of 5/3 when labs last drawn. She was doing well and awaiting discharge back to her hospice program (expected discharge later today), when she had N/V early this AM and aspirated. She then developed respiratory distress. Hospitalist NP called [facility] requesting a PCCM consult. When I arrived at bedside, primary team is not here. RN reports no one from the primary team has been here since the N/V and Respiratory distress started. Discussed code status with patient. She is currently partial code; discussed what intubation entails. She says she does not want that. She says she wants to be DNR/DNI but wants to be comfortable. She reports pain in her low back. She also reports SOB and Wheezing. Is not tolerating the face mask for nebs due to anxiety/claustrophobia.   PAST MEDICAL HISTORY :  She  has a past medical history of Cancer [facility]), Esophageal stricture, GERD (gastroesophageal reflux disease), Hyperlipemia, Hypertension, Seizures ([place]) (11/01/2017), Solitary kidney,  and Stroke ([place]) (05/2017).  PAST SURGICAL HISTORY: She  has a past surgical history that includes Kidney surgery; Colonoscopy with propofol (N/A, 05/28/2017); Esophagogastroduodenoscopy (N/A, 05/28/2017); IR FLUORO GUIDE PORT INSERTION RIGHT (06/23/2017); IR US Guide Vasc Access Right (06/23/2017); IR NEPHROSTOMY PLACEMENT RIGHT (12/06/2017); Esophagogastroduodenoscopy (12/07/2017); and Esophagogastroduodenoscopy (egd) with propofol (N/A, 12/07/2017).  Allergies  Allergen Reactions  . Ceftriaxone Swelling    Lip swelling  . Penicillins Hives    Has patient had a PCN reaction causing immediate rash, facial/tongue/throat swelling, SOB or lightheadedness with hypotension: yes Has patient had a PCN reaction causing severe rash involving mucus membranes or skin necrosis: unkn Has patient had a PCN reaction that required hospitalization: yes Has patient had a PCN reaction occurring within the last 10 years: no If all of the above answers are "NO", then may proceed with Cephalosporin use.    No current facility-administered medications on file prior to encounter.    Current Outpatient Medications on File Prior to Encounter  Medication Sig  . allopurinol (ZYLOPRIM) 100 MG tablet Take 100 mg by mouth daily.  Marland Kitchen enoxaparin (LOVENOX) 80 MG/0.8ML injection INJECT 0.8 ML UNDER THE SKIN ONCE DAILY  . HYDROcodone-acetaminophen (NORCO) 5-325 MG tablet Take 1 tablet by mouth every 6 (six) hours as needed for moderate pain.  Marland Kitchen latanoprost (XALATAN) 0.005 % ophthalmic solution Place 1 drop into both eyes at bedtime.  . levETIRAcetam (KEPPRA) 500 MG tablet Take 1 tablet (500 mg total) by mouth 2 (two) times daily.  Marland Kitchen levothyroxine (SYNTHROID, LEVOTHROID) 75 MCG tablet Take 75 mcg by mouth daily.  Marland Kitchen lidocaine-prilocaine (EMLA) cream Apply to port site one hour prior to use. Do not rub in. Cover with plastic.  Marland Kitchen  ondansetron (ZOFRAN ODT) 4 MG disintegrating tablet Take 2 tablets (8 mg total) by mouth every 8  (eight) hours as needed for nausea or vomiting.  . ondansetron (ZOFRAN) 8 MG tablet Take 8 mg by mouth every 8 (eight) hours as needed for nausea or vomiting.  . potassium chloride SA (K-DUR,KLOR-CON) 20 MEQ tablet Take 1 tablet (20 mEq total) by mouth daily.  . prochlorperazine (COMPAZINE) 5 MG tablet Take 5 mg by mouth every 6 (six) hours as needed for nausea or vomiting.   FAMILY HISTORY:  Her indicated that her mother is deceased. She indicated that her father is deceased.  SOCIAL HISTORY: She  reports that she has never smoked. She has never used smokeless tobacco. She reports that she does not drink alcohol or use drugs.  REVIEW OF SYSTEMS:   Review of Systems  Constitutional: Negative.   HENT: Negative.   Eyes: Negative.   Respiratory: Positive for cough, shortness of breath and wheezing.   Cardiovascular: Positive for leg swelling. Negative for chest pain.  Gastrointestinal: Positive for abdominal pain, nausea and vomiting.  Genitourinary: Negative.   Musculoskeletal: Positive for back pain.  Skin: Negative.   Neurological: Negative.   Endo/Heme/Allergies: Negative.   Psychiatric/Behavioral: Negative.    SUBJECTIVE:  Sitting up in bed, increased work of breathing  VITAL SIGNS: BP (!) 135/105 (BP Location: Right Arm)   Pulse (!) 116   Temp (!) 97.4 F (36.3 C) (Oral)   Resp (!) 33   Ht 5\' 3"  (1.6 m)   Wt 93.6 kg (206 lb 5.6 oz)   SpO2 90%   BMI 36.55 kg/m   INTAKE / OUTPUT: I/O last 3 completed shifts: In: 330 [P.O.:180; IV Piggyback:150] Out: 400 [Urine:400]  PHYSICAL EXAMINATION: General: Elderly female, sitting up in bed, in respiratory distress Neuro: AAOx3, moving all extremities, obeying commands  HEENT: OP clear, MM moist  Cardiovascular: Tachycardic with a regular rhythm, no m/r/g Lungs: Coarse breath sounds b/l with some wheezing; mild upper airway stridor and "squeak", + accessory muscle use Abdomen: Distended, mildly TTP diffusely, no  g/r Musculoskeletal: 2-3+ BLE edema Skin: no rashes   LABS:  BMET Recent Labs  Lab 12/09/17 0300 12/09/17 0420 12/10/17 0730  NA 127* 140 138  K 6.5* 3.1* 3.2*  CL 107 117* 115*  CO2 16* 15* 16*  BUN 20 19 16   CREATININE 2.17* 1.87* 1.78*  GLUCOSE 692* 84 93   Electrolytes Recent Labs  Lab 12/09/17 0300 12/09/17 0420 12/10/17 0730  CALCIUM 7.2* 6.6* 7.4*   CBC Recent Labs  Lab 12/09/17 0300 12/09/17 0420 12/10/17 0730  WBC 6.8 7.1 6.3  HGB 8.2* 8.6* 7.9*  HCT 25.1* 25.9* 23.8*  PLT 148* 151 128*   Coag's No results for input(s): APTT, INR in the last 168 hours.  Sepsis Markers No results for input(s): LATICACIDVEN, PROCALCITON, O2SATVEN in the last 168 hours.  ABG No results for input(s): PHART, PCO2ART, PO2ART in the last 168 hours.  Liver Enzymes No results for input(s): AST, ALT, ALKPHOS, BILITOT, ALBUMIN in the last 168 hours.  Cardiac Enzymes No results for input(s): TROPONINI, PROBNP in the last 168 hours.  Glucose Recent Labs  Lab 12/09/17 0414  GLUCAP 109*   Imaging Dg Chest Port 1 View  Result Date: 12/13/2017 CLINICAL DATA:  83 year old female with shortness of breath. Concern for aspiration. EXAM: PORTABLE CHEST 1 VIEW COMPARISON:  Chest radiograph dated 12/03/2017 FINDINGS: Right pectoral Port-A-Cath with tip at the cavoatrial junction. There is shallow inspiration with  bibasilar atelectatic changes. No focal consolidation, pleural effusion, or pneumothorax. Bilateral pulmonary nodular densities better seen on the prior radiograph. The cardiac silhouette is within normal limits. No acute osseous pathology. Retained contrast in the colonic diverticula. Right sided percutaneous pigtail drainage catheter. IMPRESSION: 1. No acute cardiopulmonary process. 2. Bilateral pulmonary nodules/metastatic disease better seen on the prior radiograph and CT. Electronically Signed   By: Anner Crete M.D.   On: 12/13/2017 03:30   SIGNIFICANT EVENTS: 5/6:  vomited and aspirated >> developed respiratory distress  LINES/TUBES: Chest Port Foley catheter  DISCUSSION: 82yoF with hx CVA, SZ, HTN, GERD, Renal cell carcinoma, Esophageal strictures, Metastatic adenocarcinoma of the colon with peritoneal mets (for which she was on hospice prior to admission), DVT (on lovenox), who presented on 4/26 with N/V, Abdominal pain, and SOB, found to have AKI-on-CKD and Severe reflux esophagitis in addition to her known diagnosis of Esophageal stricture. CT Abdomen acute right hydronephrosis, for which right-sided nephrostomy tube was placed. Now she developed N/V early this AM and aspirated, then developed respiratory distress.   ASSESSMENT / PLAN:  PULMONARY 1. Acute Hypoxic Respiratory Failure; Aspiration Pneumonitis: - increased hypoxia and work of breathing following acute aspiration - CXR shows low lung volumes but no definitive infiltrates - patient not tolerating face mask due to anxiety/claustrophobia. Changed from nasal cannula to [place].  - sounds like secretions in posterior OP that patient is not expectorating; NT suctioned her which she tolerated very well. Got moderate amount clear secretions back.  - she does not like the nebs via face mask for same reason of anxiety/claustrophobia; changed her nebs to be given via pipe/spacer which she tolerated well - obtained VBG which showed metabolic acidosis; discussed treatment below. No hypercapnea though.  - discussed code status in detail with patient; she wants to be DNR/DNI. Have changed her code status. - called patient's son and updated him on these acute events as well as the change to her code status. He said "okay" but didn't have any questions. - patient does not need transfer to ICU at this time since she is a hospice patient and is now DNR/DNI again; focus on treating the treatable conditions but avoiding aggressive interventions. Focus on treating her pain and dyspnea. Fentanyl can be given for  pain or dyspnea.  - primary team consider adding fentanyl patch if pain still not well controlled.   CARDIOVASCULAR 1. Hx HTN - hold lasix for now as does not appear volume overloaded - metop IV PRN  RENAL 1. AKI-on-CKD; Metabolic acidosis; Hydronephrosis s/p Nephrostomy tube : - creatinine when last checked on 5/3 was 1.78 which is at her baseline - VBG this AM shows metabolic acidosis (1.88/41, with bicarb 14). This bicarb isn't much down from her prior bicarb of 16 at which time she had a normal pH. Therefore am concerned the worsening acidosis may be due to worsening hyperchloridemia or lactic acidosis - check lactate as well as new BMP - give Bicarb 2 amps IV now  GASTROINTESTINAL 1. GERD; Reflux esophagitis; Esophageal Stricture: - change IV Pepcid to IV Pantoprazole - NPO  HEMATOLOGIC 1. Renal cell carcinoma, Metastatic adenocarcinoma of the colon with peritoneal mets (for which she was on hospice prior to admission), DVT; Low back pain: - continue fentanyl IV q2hrs PRN for pain from bone mets - start lidocaine patch for low back pain - start IV Acetaminophen - DNR/DNI; plan to discharge to hospice today  INFECTIOUS No active issues   ENDOCRINE No active issues  NEUROLOGIC No active issues    FAMILY  - Updates: called patient's son and updated him on her change of status and her code status change  60 minutes critical care time  Vernie Murders, MD  Pulmonary and [place] Pager: [phone]  12/13/2017, 4:41 AM    Addendum-PCCM. Pt seen in am rounds in [place]. RN reported that pt is in [place]. Awaiting bed in [facility] to be discharged with Hospice care. Sinus tach- 140/min,No distress, diminished breath sounds,abd-non tender. Continue palliative treatment measures to keep pt comfortable at end of life. PCCM signing off.

## 2017-12-13 NOTE — Progress Notes (Signed)
Pt had an episode of vomiting, and difficulty breathing. O2 stat dropped to the 80s on room air. Pt put on Oxygen at 4L and she is holding her sat at 95. Compazine administered as ordered. On call Doctor notified new order received. We'll continue to monitor.

## 2017-12-13 NOTE — Progress Notes (Signed)
Palliative Medicine RN Note: AM check. Pt has cool legs, but she is awake and talking, oriented. She reports symptoms are well-contolled on current regimen.   Of note, patient has changed her code status to DNR since I saw her Saturday.  Marjie Skiff Payeton Germani, RN, BSN, [facility] Palliative Medicine Team 12/13/2017 11:35 AM Office [phone]

## 2017-12-13 NOTE — Progress Notes (Signed)
Chest X ray done, patient still struggling to breath.On call MD notified new order received. We'll continue to monitor.

## 2017-12-13 NOTE — Progress Notes (Signed)
4E-07 [facility]) RN Visit @ 1030 am  Met with patient in the room to notify that [facility] does not have a room to offer today. Eliezer Lofts, CSW aware. Patient and CSW aware that [facility] liaison will follow up tomorrow or sooner if a room becomes available.  Please call with any hospice related questions or concerns.  Thank you, Margaretmary Eddy, RN, [facility] Liaison [phone]  [place] are on AMION

## 2017-12-13 NOTE — Care Management Note (Signed)
Case Management Note Marvetta Gibbons RN, BSN Unit 4E-Case Manager [phone]  Patient Details  Name: Shawna Schneider MRN: [id number] Date of Birth: 15-Mar-1935  Subjective/Objective:  Pt admitted with intractable N/V-  Hx of malignant colon CA, hydronephrosis- s/p nephrostomy placement-right.               Action/Plan: PTA pt lived at home with family- was under hospice care with [facility]- at this time pt/famliy have revoked pt's hospice benefit effective 4/28- should pt/family want Hospice care again [facility] may be called again for further assistance- at this time pt/family want full treatment for patient. CM to follow for transition of care needs.    Expected Discharge Date:                  Expected Discharge Plan:  [place]  In-House Referral:  Clinical Social Work  Discharge planning Services  CM Consult  Post Acute Care Choice:    Choice offered to:     DME Arranged:    DME Agency:     HH Arranged:    [place] Agency:     Status of Service:  Completed, signed off  If discussed at [employer], dates discussed:    Discharge Disposition:   Additional Comments:  12/13/17- 1050- Jennetta Flood RN, CM- on 5/3 pt made decision to go to comfort care focus with desire to go to [place] aware and following for possible tx to [facility] if bed available today and if pt remains medically stable for transfer. Pt is a DNR at this time and son agreeable with plan and patients wishes.   Dawayne Patricia, RN 12/13/2017, 10:52 AM

## 2017-12-13 NOTE — Progress Notes (Addendum)
Patient will discharge to [facility] Anticipated discharge date: 5/6 Family notified: son at bedside Transportation by [facility]- called at 2:50pm anticipated about 1.5-2 hours behind Report #: [phone]  [place] signing off.  Jorge Ny, LCSW Clinical Social Worker [phone]

## 2017-12-13 NOTE — Discharge Summary (Signed)
Discharge Summary  Shawna Schneider IFO:[id number] DOB: [birth year]  PCP: [name], MD  Admit date: 12/03/2017 Discharge date:    Time spent: < 25 minutes  Admitted From: Home Disposition: Residential hospice   Discharge Diagnoses:  Active Hospital Problems   Diagnosis Date Noted  . Intractable nausea and vomiting 12/03/2017  . Esophageal stricture   . Ulcerative esophagitis   . Esophageal dysphagia   . Paraesophageal hiatal hernia   . Esophageal dysmotility   . Acute renal failure superimposed on chronic kidney disease ([place]) 12/04/2017  . Dyspnea 12/04/2017  . Seizure ([place]) 11/01/2017  . Metastatic cancer ([place])   . Malignant neoplasm of descending colon [facility])     Resolved Hospital Problems  No resolved problems to display.    Discharge Condition: Hospice  CODE STATUS: DNR  Diet recommendation: Heart Healthy / Carb Modified / Regular / Dysphagia   Vitals:   12/13/17 1148 12/13/17 1333  BP: 94/74   Pulse: 70   Resp: 20   Temp: (!) 97.5 F (36.4 C)   SpO2:  99%    History of present illness:  Shawna Schneider is a 83 y.o. year old female with medical history significant for renal cell carcinoma status post nephrectomy, metastatic adenocarcinoma of the colon, DVT on Lovenox, esophageal stricture, history of stroke, hypertension who presented on 12/03/2017 with intractable nausea and vomiting, abdominal pain and was found to have esophageal stenosis with severe reflux esophagitis and persistent peritoneal carcinomatosis. Remaining hospital course addressed in problem based format below:   Hospital Course:  Principal Problem:   Intractable nausea and vomiting Active Problems:   Malignant neoplasm of descending colon (HCC)   Metastatic cancer (HCC)   Seizure (HCC)   Acute renal failure superimposed on chronic kidney disease (HCC)   Dyspnea   Esophageal dysphagia   Paraesophageal hiatal hernia   Esophageal dysmotility   Esophageal stricture  Ulcerative esophagitis  1. Intractable nausea and vomiting, persistent.  Related to diffuse peritoneal carcinomatosis secondary to malignancy and severe esophagitis/stenosis that did not improve with EGD dilatation,IV PPI,and gut motility agents.  Patient has not been able to tolerate oral intake she has continued to have episodes of emesis.  Patient and her son elected for residential hospice with focus on comfort.  2. Acute hypoxic respiratory failure, suspect aspiration pneumonitis.  Patient noted to have  hypoxia after episode of emesis.  No definitive infiltrates on chest x-ray, concern for aspiration pneumonitis.  Patient elects for DNR/DNI CODE STATUS.IV fentanyl for pain and dyspnea,, Scheduled duo nebs  3. Severe ulcerative esophagitis.  Status post EGD dilatation.  Esophageal biopsy with ulcer, no sign of malignancy.  IV PPI and scheduled Reglan for support.  Currently n.p.o. until patient willing to try diet  4. AKI on CKD stage III with right-sided hydronephrosis and hydroureter.  Patient received nephrostomy tube placed by IR on 4/29 for her hydronephrosis in the setting of likely metastatic lesions.  Her creatinine peaked at 3.33 before returning to baseline.  Once patient was made comfort care for the lab draws were discontinued.  Urology saw patient and recommends continuing external tubing for now.  5. Stage IV metastatic colon cancer.  No acute bowel obstruction found on CT shows persistent diffuse peritoneal carcinomatosis likely because contributed to intractable nausea/vomiting.  Given advanced metastatic disease patient is not a candidate for systemic therapy per oncology.  Patient initially rescinded hospice status upon admission to pursue EGD and nephrostomy tube placement for hopeful improvement in her symptoms.  Patient and family now want to pursue residential hospice with focus on comfort care.  Continue pain components, started a lidocaine patch for low back pain, IV  Tylenol.  6. Chronic anemia.  Hemoglobin stable.  Secondary to underlying malignancy  7. History of bilateral lower extremity DVT ([place] 05/2017).  Continue home Lovenox  8. History of seizures.  Stable.  IV Keppra until able to tolerate p.o.   Consultants:  GI, Urology, IR, Palliative  Procedures:  EGD- 4/30  Nephrostomy tube ( IR) 4/29  Antimicrobials:  none  Cultures:  none    Procedures/Studies:  EGD- 4/30  Nephrostomy tube ( IR) 4/29    Ct Abdomen Pelvis Wo Contrast  Result Date: 12/04/2017 CLINICAL DATA:  Nausea, vomiting, and abdominal pain for 3 days. History of colon cancer. Stop chemotherapy about 3 weeks ago. Abdominal distention. EXAM: CT ABDOMEN AND PELVIS WITHOUT CONTRAST TECHNIQUE: Multidetector CT imaging of the abdomen and pelvis was performed following the standard protocol without IV contrast. COMPARISON:  11/04/2017 FINDINGS: Lower chest: Bilateral pulmonary nodules measuring up to 2.4 cm diameter, likely pulmonary metastases. Since the previous study, there has been near complete resolution of pleural effusions and basilar atelectasis. Mild residual atelectasis in the right lung base. Moderately large esophageal hiatal hernia. Hepatobiliary: No focal liver lesions identified. Liver appears atrophic, possibly cirrhotic. Gallbladder is distended. No wall thickening, stone, or inflammatory infiltration. No bile duct dilatation. Pancreas: Unremarkable. No pancreatic ductal dilatation or surrounding inflammatory changes. Spleen: Normal in size without focal abnormality. Adrenals/Urinary Tract: Left kidney is absent, possibly congenital or postoperative. Multiple hyperdense lesions in the right kidney likely representing hemorrhagic cysts. Right hydronephrosis and hydroureter. Difficult to follow the ureter due to ascites. Cause of obstruction is not identified. This could represent obstruction or reflux. Appearance is new since prior study. Stomach/Bowel:  Stomach, small bowel, and colon are mostly decompressed. There is residual contrast material demonstrated throughout the colon. Multiple colonic diverticula with contrast filling. Vascular/Lymphatic: Calcification of the abdominal aorta. No aneurysm. Moderate prominent lymph nodes in the retroperitoneum, some with increased density. These may be metastatic. Reproductive: Limited visualization of the uterus. No obvious pelvic masses. Other: Diffuse abdominal and pelvic ascites with nodular infiltration of the mesentery and peritoneal implants consistent with diffuse peritoneal carcinomatosis. Small midline anterior abdominal wall hernias containing fluid and a small portion of the wall of small bowel. Soft tissue edema throughout the subcutaneous fat. No free air. Emphysema in the subcutaneous soft tissues of the anterior abdominal wall likely representing a site of injection. Musculoskeletal: Degenerative changes in the spine. Vague lucent lesions demonstrated in the right iliac bone and acetabulum may represent degenerative cysts or lucent metastasis. IMPRESSION: 1. Prominent diffuse abdominal and pelvic ascites with nodularity to the omentum and peritoneum consistent with peritoneal carcinomatosis. Similar to prior study. 2. Probable metastatic pulmonary nodules, retroperitoneal lymph nodes, and possible pelvic bone metastasis. 3. New right hydronephrosis and hydroureter of indeterminate etiology. No stones are identified. 4. Hemorrhagic cysts in the right kidney. 5. Interval improvement of pleural effusions and atelectasis in the lung bases since previous study. 6. Aortic atherosclerosis. 7. Residual contrast material throughout the colon and in colonic diverticula. No evidence of obstruction. 8. Midline anterior abdominal wall hernias, 1 containing fluid and another containing a small portion of the wall of the small bowel loop. No obstruction. 9. Soft tissue edema throughout the subcutaneous fat. Electronically  Signed   By: Lucienne Capers M.D.   On: 12/04/2017 00:28   Dg Esophagus  Result  Date: 12/01/2017 CLINICAL DATA:  Esophageal dysphagia. Vomiting. History of metastatic colon cancer. EXAM: ESOPHOGRAM/BARIUM SWALLOW TECHNIQUE: Single contrast examination was performed using  thin barium. FLUOROSCOPY TIME:  Fluoroscopy Time:  1 minutes and 36 seconds Radiation Exposure Index (if provided by the fluoroscopic device): 20.6 mGy Number of Acquired Spot Images: 0 COMPARISON:  Chest CT 11/04/2017 FINDINGS: Initial barium swallows demonstrate normal pharyngeal motion with swallowing. No laryngeal penetration or aspiration. No upper esophageal webs, strictures or diverticuli. Esophageal dysmotility with poor propagation of the primary peristaltic wave and frequent tertiary contractions/esophageal spasm. Distal esophageal stricture suspected just above the esophageal vestibule. Patient could not attempt to swallow the 13 mm barium pill because of vomiting. Small sliding-type hiatal hernia. IMPRESSION: 1. Esophageal dysmotility. 2. Suspect distal esophageal stricture just above the esophageal vestibule. Endoscopy may be helpful for further evaluation and treatment. 3. Small hiatal hernia. Electronically Signed   By: Marijo Sanes M.D.   On: 12/01/2017 14:44   Dg Chest Port 1 View  Result Date: 12/13/2017 CLINICAL DATA:  83 year old female with shortness of breath. Concern for aspiration. EXAM: PORTABLE CHEST 1 VIEW COMPARISON:  Chest radiograph dated 12/03/2017 FINDINGS: Right pectoral Port-A-Cath with tip at the cavoatrial junction. There is shallow inspiration with bibasilar atelectatic changes. No focal consolidation, pleural effusion, or pneumothorax. Bilateral pulmonary nodular densities better seen on the prior radiograph. The cardiac silhouette is within normal limits. No acute osseous pathology. Retained contrast in the colonic diverticula. Right sided percutaneous pigtail drainage catheter. IMPRESSION: 1. No  acute cardiopulmonary process. 2. Bilateral pulmonary nodules/metastatic disease better seen on the prior radiograph and CT. Electronically Signed   By: Anner Crete M.D.   On: 12/13/2017 03:30   Dg Abd Acute W/chest  Result Date: 12/03/2017 CLINICAL DATA:  Vomiting with history of colon cancer EXAM: DG ABDOMEN ACUTE W/ 1V CHEST COMPARISON:  Chest CT 11/04/2017 FINDINGS: Accessed, power injectable right chest wall Port-A-Cath follows a right internal jugular vein approach with tip in the lower SVC. There are multiple bilateral pulmonary nodules. Right hemidiaphragm is elevated. No pneumothorax or pleural effusion. There is retained contrast material in the colon. No dilated small bowel. No free intraperitoneal air. IMPRESSION: 1. Multiple pulmonary metastases, as previously demonstrated by chest CT. 2. Retained enteric contrast within the colon. No evidence of small-bowel obstruction. Electronically Signed   By: Ulyses Jarred M.D.   On: 12/03/2017 21:24   Ir Nephrostomy Placement Right  Result Date: 12/06/2017 INDICATION: 83 year old with metastatic colon cancer and acute on chronic renal failure. Recent imaging demonstrates right hydronephrosis. Plan for percutaneous nephrostomy tube placement. Patient has a solitary right kidney due to history of renal cell carcinoma and left nephrectomy. EXAM: RIGHT PERCUTANEOUS NEPHROSTOMY TUBE PLACEMENT WITH ULTRASOUND AND FLUOROSCOPIC GUIDANCE MEDICATIONS: Ciprofloxacin 400 mg; The antibiotic was administered in an appropriate time frame prior to skin puncture. ANESTHESIA/SEDATION: Fentanyl 50 mcg IV; Versed 1.0 mg IV Moderate Sedation Time:  13 minutes The patient was continuously monitored during the procedure by the interventional radiology nurse under my direct supervision. CONTRAST:  10 ml - administered into the collecting system(s) FLUOROSCOPY TIME:  Fluoroscopy Time: 54 seconds, 7 mGy COMPLICATIONS: None immediate. PROCEDURE: The procedure was explained to  the patient. The risks and benefits of the procedure were discussed and the patient's questions were addressed. Informed consent was obtained from the patient. Patient was placed prone. Right flank was prepped and draped in sterile fashion. Maximal barrier sterile technique was utilized including caps, mask, sterile gowns, sterile gloves, sterile drape,  hand hygiene and skin antiseptic. Ultrasound demonstrated moderate right hydronephrosis. Skin was anesthetized with 1% lidocaine. 21 gauge needle directed into a lower pole calyx with ultrasound guidance. A 0.018 wire was advanced into the renal collecting system. Accustick dilator set was placed. Tract was dilated over a J wire and a 10 [place] multipurpose drain was advanced into the renal pelvis. Blood tinged urine was removed. Renal collecting system was decompressed based on ultrasound. Catheter was sutured to skin and attached to gravity bag. Urine sample sent for culture. Fluoroscopic and ultrasound images were taken and saved for documentation. FINDINGS: Moderate right hydronephrosis. Multiple renal cortical cysts. Nephrostomy tube positioned in the renal pelvis. IMPRESSION: Successful right percutaneous nephrostomy tube placement with ultrasound and fluoroscopic guidance. Electronically Signed   By: Markus Daft M.D.   On: 12/06/2017 19:15     Discharge Exam: BP 94/74 (BP Location: Left Arm)   Pulse 70   Temp (!) 97.5 F (36.4 C) (Oral)   Resp 20   Ht 5\' 3"  (1.6 m)   Wt 93.6 kg (206 lb 5.6 oz)   SpO2 99%   BMI 36.55 kg/m   General:Chronically ill-appearing woman, in no acute distress Eyes: EOMI, anicteric ENT: Dry Oral Mucosa  Cardiovascular: 1+ pitting edema bilateral lower extremities Respiratory: Normal respiratory effort On 4 L nasal cannula,  Abdomen: soft, distended, non-tender Skin: No Rash  Neurologic: Grossly no focal neuro deficit.Mental status AAOx3, speech normal, Psychiatric:Flat affect, and mood   Discharge  Instructions You were cared for by a hospitalist during your hospital stay. If you have any questions about your discharge medications or the care you received while you were in the hospital after you are discharged, you can call the unit and asked to speak with the hospitalist on call if the hospitalist that took care of you is not available. Once you are discharged, your primary care physician will handle any further medical issues. Please note that NO REFILLS for any discharge medications will be authorized once you are discharged, as it is imperative that you return to your primary care physician (or establish a relationship with a primary care physician if you do not have one) for your aftercare needs so that they can reassess your need for medications and monitor your lab values.  Discharge Instructions    Diet - low sodium heart healthy   Complete by:  As directed    Increase activity slowly   Complete by:  As directed      Allergies as of 12/13/2017      Reactions   Ceftriaxone Swelling   Lip swelling   Penicillins Hives   Has patient had a PCN reaction causing immediate rash, facial/tongue/throat swelling, SOB or lightheadedness with hypotension: yes Has patient had a PCN reaction causing severe rash involving mucus membranes or skin necrosis: unkn Has patient had a PCN reaction that required hospitalization: yes Has patient had a PCN reaction occurring within the last 10 years: no If all of the above answers are "NO", then may proceed with Cephalosporin use.      Medication List    STOP taking these medications   allopurinol 100 MG tablet Commonly known as:  ZYLOPRIM   HYDROcodone-acetaminophen 5-325 MG tablet Commonly known as:  NORCO   latanoprost 0.005 % ophthalmic solution Commonly known as:  XALATAN   levETIRAcetam 500 MG tablet Commonly known as:  KEPPRA   levothyroxine 75 MCG tablet Commonly known as:  SYNTHROID, LEVOTHROID   ondansetron 4 MG disintegrating  tablet Commonly known as:  ZOFRAN ODT   ondansetron 8 MG tablet Commonly known as:  ZOFRAN Replaced by:  ondansetron 4 MG/2ML Soln injection   potassium chloride SA 20 MEQ tablet Commonly known as:  K-DUR,KLOR-CON   prochlorperazine 5 MG tablet Commonly known as:  COMPAZINE     TAKE these medications   acetaminophen 10 MG/ML Soln Commonly known as:  OFIRMEV Inject 100 mLs (1,000 mg total) into the vein every 6 (six) hours.   enoxaparin 80 MG/0.8ML injection Commonly known as:  LOVENOX Inject 0.8 mLs (80 mg total) into the skin daily. What changed:  See the new instructions.   fentaNYL 100 MCG/2ML injection Commonly known as:  SUBLIMAZE Inject 0.5 mLs (25 mcg total) into the vein every 2 (two) hours as needed for severe pain.   glycopyrrolate 0.2 MG/ML injection Commonly known as:  ROBINUL Inject 1 mL (0.2 mg total) into the vein every 4 (four) hours as needed (secretions).   HYDROmorphone 1 MG/ML injection Commonly known as:  DILAUDID Inject 0.5 mLs (0.5 mg total) into the vein every 2 (two) hours as needed for severe pain (or dyspnea).   ipratropium-albuterol 0.5-2.5 (3) MG/3ML Soln Commonly known as:  DUONEB Take 3 mLs by nebulization every 6 (six) hours.   levETIRAcetam 500 MG/100ML Soln Commonly known as:  KEPRRA Inject 100 mLs (500 mg total) into the vein 2 (two) times daily.   lidocaine 5 % Commonly known as:  LIDODERM Place 1 patch onto the skin daily. Remove & Discard patch within 12 hours or as directed by MD Start taking on:  12/23/17   lidocaine-prilocaine cream Commonly known as:  EMLA Apply topically as needed (Use of port). What changed:    how to take this  when to take this  reasons to take this  additional instructions   LORazepam 2 MG/ML injection Commonly known as:  ATIVAN Inject 0.13 mLs (0.26 mg total) into the vein every 4 (four) hours as needed for anxiety (nausea/vomiting).   metoCLOPramide 5 MG/ML injection Commonly known as:   REGLAN Inject 1 mL (5 mg total) into the vein every 6 (six) hours.   metoprolol tartrate 5 MG/5ML Soln injection Commonly known as:  LOPRESSOR Inject 5 mLs (5 mg total) into the vein every 4 (four) hours as needed (HR > 120 bpm, Hold for SBP < 100).   ondansetron 4 MG/2ML Soln injection Commonly known as:  ZOFRAN Inject 2 mLs (4 mg total) into the vein every 6 (six) hours as needed for nausea. Replaces:  ondansetron 8 MG tablet   pantoprazole 40 MG injection Commonly known as:  PROTONIX Inject 40 mg into the vein daily.   polyvinyl alcohol 1.4 % ophthalmic solution Commonly known as:  LIQUIFILM TEARS Place 1 drop into both eyes as needed for dry eyes.   prochlorperazine 10 MG/2ML injection Commonly known as:  COMPAZINE Inject 2 mLs (10 mg total) into the vein every 6 (six) hours as needed.   sodium chloride flush 0.9 % Soln Commonly known as:  NS 10-40 mLs by Intracatheter route as needed (flush).      Allergies  Allergen Reactions  . Ceftriaxone Swelling    Lip swelling  . Penicillins Hives    Has patient had a PCN reaction causing immediate rash, facial/tongue/throat swelling, SOB or lightheadedness with hypotension: yes Has patient had a PCN reaction causing severe rash involving mucus membranes or skin necrosis: unkn Has patient had a PCN reaction that required hospitalization: yes Has patient had  a PCN reaction occurring within the last 10 years: no If all of the above answers are "NO", then may proceed with Cephalosporin use.       The results of significant diagnostics from this hospitalization (including imaging, microbiology, ancillary and laboratory) are listed below for reference.    Significant Diagnostic Studies: Ct Abdomen Pelvis Wo Contrast  Result Date: 12/04/2017 CLINICAL DATA:  Nausea, vomiting, and abdominal pain for 3 days. History of colon cancer. Stop chemotherapy about 3 weeks ago. Abdominal distention. EXAM: CT ABDOMEN AND PELVIS WITHOUT  CONTRAST TECHNIQUE: Multidetector CT imaging of the abdomen and pelvis was performed following the standard protocol without IV contrast. COMPARISON:  11/04/2017 FINDINGS: Lower chest: Bilateral pulmonary nodules measuring up to 2.4 cm diameter, likely pulmonary metastases. Since the previous study, there has been near complete resolution of pleural effusions and basilar atelectasis. Mild residual atelectasis in the right lung base. Moderately large esophageal hiatal hernia. Hepatobiliary: No focal liver lesions identified. Liver appears atrophic, possibly cirrhotic. Gallbladder is distended. No wall thickening, stone, or inflammatory infiltration. No bile duct dilatation. Pancreas: Unremarkable. No pancreatic ductal dilatation or surrounding inflammatory changes. Spleen: Normal in size without focal abnormality. Adrenals/Urinary Tract: Left kidney is absent, possibly congenital or postoperative. Multiple hyperdense lesions in the right kidney likely representing hemorrhagic cysts. Right hydronephrosis and hydroureter. Difficult to follow the ureter due to ascites. Cause of obstruction is not identified. This could represent obstruction or reflux. Appearance is new since prior study. Stomach/Bowel: Stomach, small bowel, and colon are mostly decompressed. There is residual contrast material demonstrated throughout the colon. Multiple colonic diverticula with contrast filling. Vascular/Lymphatic: Calcification of the abdominal aorta. No aneurysm. Moderate prominent lymph nodes in the retroperitoneum, some with increased density. These may be metastatic. Reproductive: Limited visualization of the uterus. No obvious pelvic masses. Other: Diffuse abdominal and pelvic ascites with nodular infiltration of the mesentery and peritoneal implants consistent with diffuse peritoneal carcinomatosis. Small midline anterior abdominal wall hernias containing fluid and a small portion of the wall of small bowel. Soft tissue edema  throughout the subcutaneous fat. No free air. Emphysema in the subcutaneous soft tissues of the anterior abdominal wall likely representing a site of injection. Musculoskeletal: Degenerative changes in the spine. Vague lucent lesions demonstrated in the right iliac bone and acetabulum may represent degenerative cysts or lucent metastasis. IMPRESSION: 1. Prominent diffuse abdominal and pelvic ascites with nodularity to the omentum and peritoneum consistent with peritoneal carcinomatosis. Similar to prior study. 2. Probable metastatic pulmonary nodules, retroperitoneal lymph nodes, and possible pelvic bone metastasis. 3. New right hydronephrosis and hydroureter of indeterminate etiology. No stones are identified. 4. Hemorrhagic cysts in the right kidney. 5. Interval improvement of pleural effusions and atelectasis in the lung bases since previous study. 6. Aortic atherosclerosis. 7. Residual contrast material throughout the colon and in colonic diverticula. No evidence of obstruction. 8. Midline anterior abdominal wall hernias, 1 containing fluid and another containing a small portion of the wall of the small bowel loop. No obstruction. 9. Soft tissue edema throughout the subcutaneous fat. Electronically Signed   By: Lucienne Capers M.D.   On: 12/04/2017 00:28   Dg Esophagus  Result Date: 12/01/2017 CLINICAL DATA:  Esophageal dysphagia. Vomiting. History of metastatic colon cancer. EXAM: ESOPHOGRAM/BARIUM SWALLOW TECHNIQUE: Single contrast examination was performed using  thin barium. FLUOROSCOPY TIME:  Fluoroscopy Time:  1 minutes and 36 seconds Radiation Exposure Index (if provided by the fluoroscopic device): 20.6 mGy Number of Acquired Spot Images: 0 COMPARISON:  Chest CT  11/04/2017 FINDINGS: Initial barium swallows demonstrate normal pharyngeal motion with swallowing. No laryngeal penetration or aspiration. No upper esophageal webs, strictures or diverticuli. Esophageal dysmotility with poor propagation of  the primary peristaltic wave and frequent tertiary contractions/esophageal spasm. Distal esophageal stricture suspected just above the esophageal vestibule. Patient could not attempt to swallow the 13 mm barium pill because of vomiting. Small sliding-type hiatal hernia. IMPRESSION: 1. Esophageal dysmotility. 2. Suspect distal esophageal stricture just above the esophageal vestibule. Endoscopy may be helpful for further evaluation and treatment. 3. Small hiatal hernia. Electronically Signed   By: Marijo Sanes M.D.   On: 12/01/2017 14:44   Dg Chest Port 1 View  Result Date: 12/13/2017 CLINICAL DATA:  83 year old female with shortness of breath. Concern for aspiration. EXAM: PORTABLE CHEST 1 VIEW COMPARISON:  Chest radiograph dated 12/03/2017 FINDINGS: Right pectoral Port-A-Cath with tip at the cavoatrial junction. There is shallow inspiration with bibasilar atelectatic changes. No focal consolidation, pleural effusion, or pneumothorax. Bilateral pulmonary nodular densities better seen on the prior radiograph. The cardiac silhouette is within normal limits. No acute osseous pathology. Retained contrast in the colonic diverticula. Right sided percutaneous pigtail drainage catheter. IMPRESSION: 1. No acute cardiopulmonary process. 2. Bilateral pulmonary nodules/metastatic disease better seen on the prior radiograph and CT. Electronically Signed   By: Anner Crete M.D.   On: 12/13/2017 03:30   Dg Abd Acute W/chest  Result Date: 12/03/2017 CLINICAL DATA:  Vomiting with history of colon cancer EXAM: DG ABDOMEN ACUTE W/ 1V CHEST COMPARISON:  Chest CT 11/04/2017 FINDINGS: Accessed, power injectable right chest wall Port-A-Cath follows a right internal jugular vein approach with tip in the lower SVC. There are multiple bilateral pulmonary nodules. Right hemidiaphragm is elevated. No pneumothorax or pleural effusion. There is retained contrast material in the colon. No dilated small bowel. No free intraperitoneal  air. IMPRESSION: 1. Multiple pulmonary metastases, as previously demonstrated by chest CT. 2. Retained enteric contrast within the colon. No evidence of small-bowel obstruction. Electronically Signed   By: Ulyses Jarred M.D.   On: 12/03/2017 21:24   Ir Nephrostomy Placement Right  Result Date: 12/06/2017 INDICATION: 83 year old with metastatic colon cancer and acute on chronic renal failure. Recent imaging demonstrates right hydronephrosis. Plan for percutaneous nephrostomy tube placement. Patient has a solitary right kidney due to history of renal cell carcinoma and left nephrectomy. EXAM: RIGHT PERCUTANEOUS NEPHROSTOMY TUBE PLACEMENT WITH ULTRASOUND AND FLUOROSCOPIC GUIDANCE MEDICATIONS: Ciprofloxacin 400 mg; The antibiotic was administered in an appropriate time frame prior to skin puncture. ANESTHESIA/SEDATION: Fentanyl 50 mcg IV; Versed 1.0 mg IV Moderate Sedation Time:  13 minutes The patient was continuously monitored during the procedure by the interventional radiology nurse under my direct supervision. CONTRAST:  10 ml - administered into the collecting system(s) FLUOROSCOPY TIME:  Fluoroscopy Time: 54 seconds, 7 mGy COMPLICATIONS: None immediate. PROCEDURE: The procedure was explained to the patient. The risks and benefits of the procedure were discussed and the patient's questions were addressed. Informed consent was obtained from the patient. Patient was placed prone. Right flank was prepped and draped in sterile fashion. Maximal barrier sterile technique was utilized including caps, mask, sterile gowns, sterile gloves, sterile drape, hand hygiene and skin antiseptic. Ultrasound demonstrated moderate right hydronephrosis. Skin was anesthetized with 1% lidocaine. 21 gauge needle directed into a lower pole calyx with ultrasound guidance. A 0.018 wire was advanced into the renal collecting system. Accustick dilator set was placed. Tract was dilated over a J wire and a 10 [place] multipurpose drain was  advanced into the renal pelvis. Blood tinged urine was removed. Renal collecting system was decompressed based on ultrasound. Catheter was sutured to skin and attached to gravity bag. Urine sample sent for culture. Fluoroscopic and ultrasound images were taken and saved for documentation. FINDINGS: Moderate right hydronephrosis. Multiple renal cortical cysts. Nephrostomy tube positioned in the renal pelvis. IMPRESSION: Successful right percutaneous nephrostomy tube placement with ultrasound and fluoroscopic guidance. Electronically Signed   By: Markus Daft M.D.   On: 12/06/2017 19:15    Microbiology: Recent Results (from the past 240 hour(s))  Urine Culture     Status: None   Collection Time: 12/06/17  5:14 PM  Result Value Ref Range Status   Specimen Description URINE, CATHETERIZED SITE NOT SPECIFIED  Final   Special Requests RECEIVED IN A SYRINGE  Final   Culture   Final    NO GROWTH Performed at [facility], [address], [place], [place] [address]    Report Status 12/07/2017 FINAL  Final     Labs: Basic Metabolic Panel: Recent Labs  Lab 12/07/17 0830 12/08/17 0608 12/09/17 0300 12/09/17 0420 12/10/17 0730  NA 135 139 127* 140 138  K 3.1* 3.6 6.5* 3.1* 3.2*  CL 109 112* 107 117* 115*  CO2 18* 20* 16* 15* 16*  GLUCOSE 222* 103* 692* 84 93  BUN 26* 25* 20 19 16   CREATININE 3.26* 2.92* 2.17* 1.87* 1.78*  CALCIUM 8.3* 8.5* 7.2* 6.6* 7.4*   Liver Function Tests: No results for input(s): AST, ALT, ALKPHOS, BILITOT, PROT, ALBUMIN in the last 168 hours. No results for input(s): LIPASE, AMYLASE in the last 168 hours. No results for input(s): AMMONIA in the last 168 hours. CBC: Recent Labs  Lab 12/07/17 0830 12/09/17 0300 12/09/17 0420 12/10/17 0730  WBC 8.6 6.8 7.1 6.3  HGB 8.7* 8.2* 8.6* 7.9*  HCT 26.4* 25.1* 25.9* 23.8*  MCV 84.6 84.2 83.8 84.4  PLT 181 148* 151 128*   Cardiac Enzymes: No results for input(s): CKTOTAL, CKMB, CKMBINDEX, TROPONINI in the last  168 hours. BNP: BNP (last 3 results) No results for input(s): BNP in the last 8760 hours.  ProBNP (last 3 results) No results for input(s): PROBNP in the last 8760 hours.  CBG: Recent Labs  Lab 12/09/17 0414  GLUCAP 109*       Signed:  Desiree Hane, MD [facility] 12/13/2017, 2:26 PM

## 2017-12-13 NOTE — Progress Notes (Signed)
[facility]) @ [address] did have a room become available for Shawna Schneider to transfer to today. Met with patient and son, [name] to confirm interest and explain services. [facility] agreeable to transfer today. Eliezer Lofts, CSW aware. Registration paper work completed. Dr. Betsy Coder to continue care per family request.a  Please fax discharge summary to [phone] (done by Eliezer Lofts)  RN please call report to [phone].  [facility] has been called by [place].  Please call with any hospice related questions or concerns.  Thank you, Margaretmary Eddy, RN, [facility] Liaison [phone]  [place] are on AMION

## 2017-12-14 ENCOUNTER — Telehealth: Payer: Self-pay

## 2017-12-15 ENCOUNTER — Inpatient Hospital Stay: Payer: BC Managed Care – PPO | Admitting: Oncology

## 2018-01-08 NOTE — Telephone Encounter (Signed)
Call from [facility] to report pt Shawna Schneider 09/23/1934 expired on 01/04/2018 at 10:33am. Made MD aware.

## 2018-01-08 DEATH — deceased

## 2018-12-03 IMAGING — MR MR HEAD W/O CM
10 of 11 series · 34 of 48 positions shown · non-contrast
Comparison: Head CT 11/01/2017 and MRI 05/24/2017

CLINICAL DATA: Seizure. History of renal cell carcinoma, metastatic
colon cancer, and stroke.

EXAM:
MRI HEAD WITHOUT CONTRAST
TECHNIQUE: Multiplanar, multiecho pulse sequences of the brain and surrounding
structures were obtained without intravenous contrast.

[Series 3: DWI · axial · 3.0mm · 0.94mm/px · z∈[-80,+66]mm · 8 of 100 slices shown (1 of 2)]
[im 1/100]
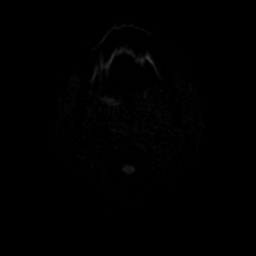
[im 12/100]
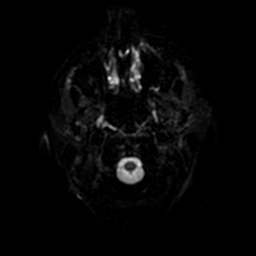
[im 34/100]
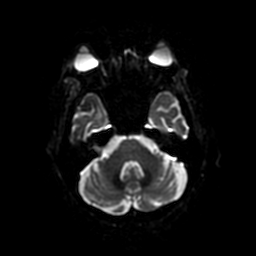
[im 45/100]
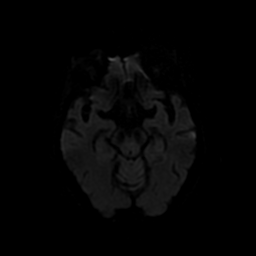
[im 56/100]
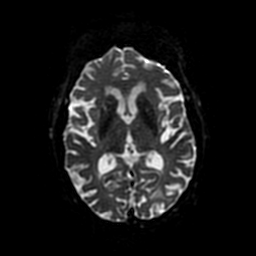
[im 67/100]
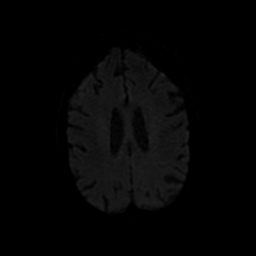
[im 89/100]
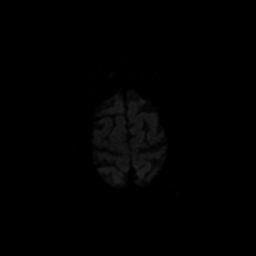
[im 100/100]
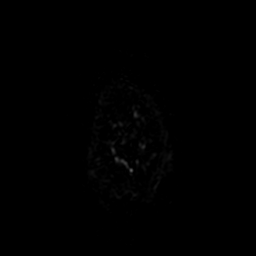

[Series 4: DWI · coronal · 4.0mm · 0.94mm/px · 6 of 66 slices shown (2 of 2)]
[im 1/66]
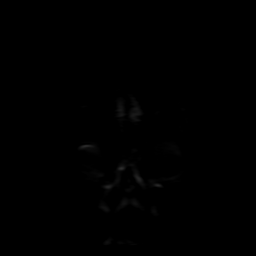
[im 14/66]
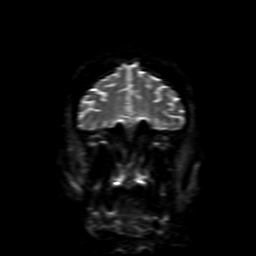
[im 27/66]
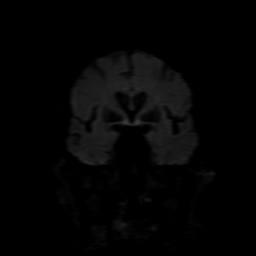
[im 40/66]
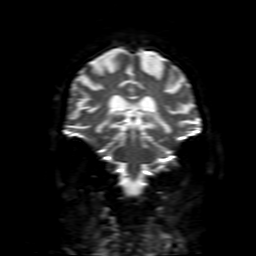
[im 53/66]
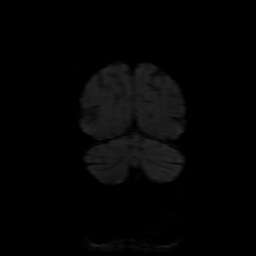
[im 66/66]
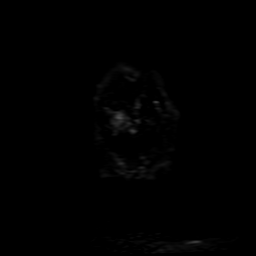

[Series 5: FLAIR · sagittal · 5.0mm · 0.47mm/px · 2 of 23 slices shown (1 of 2)]
[im 1/23]
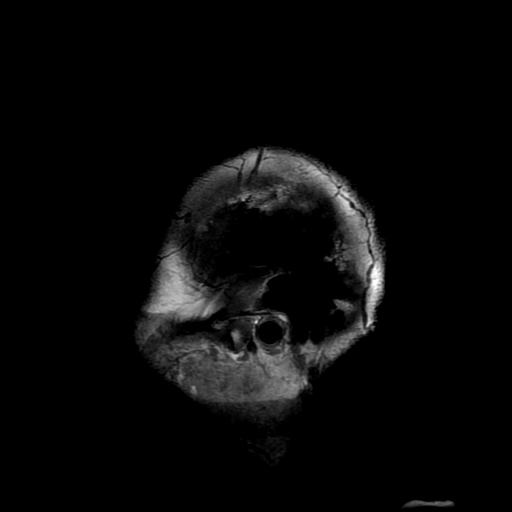
[im 23/23]
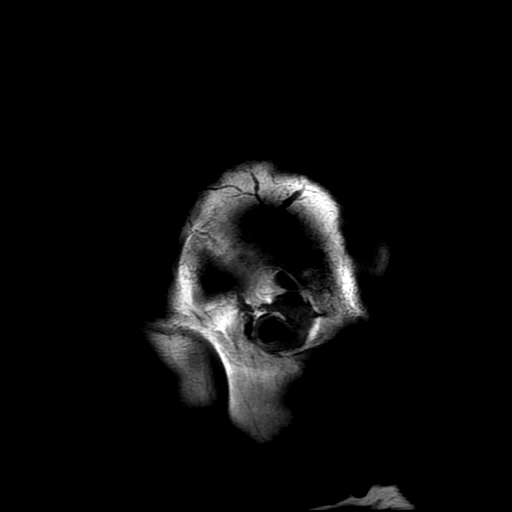

[Series 6: FLAIR · axial · 5.0mm · 0.47mm/px · z∈[-79,+64]mm · 2 of 25 slices shown (2 of 2)]
[im 1/25]
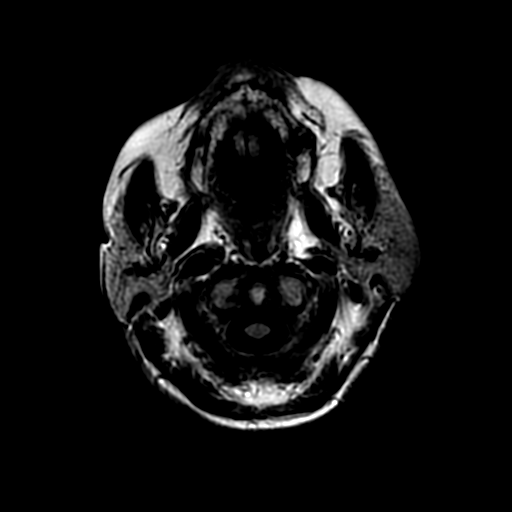
[im 25/25]
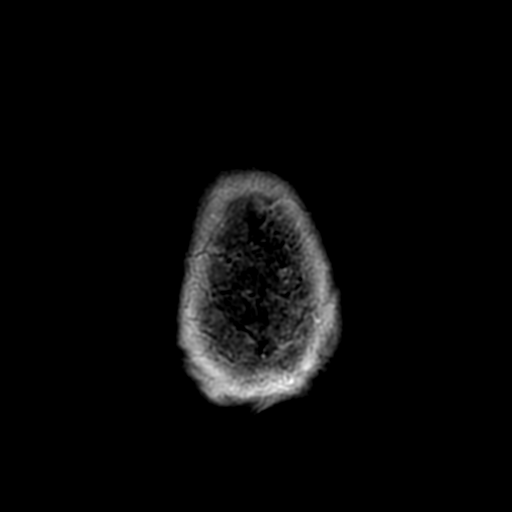

[Series 8: T2 · axial · 5.0mm · 0.47mm/px · z∈[-79,+64]mm · 2 of 25 slices shown (1 of 3)]
[im 1/25]
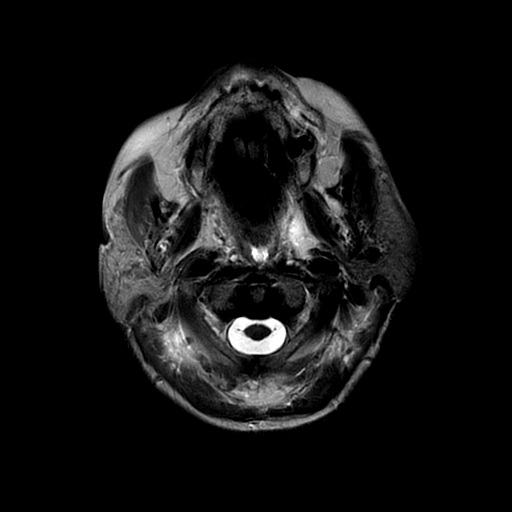
[im 25/25]
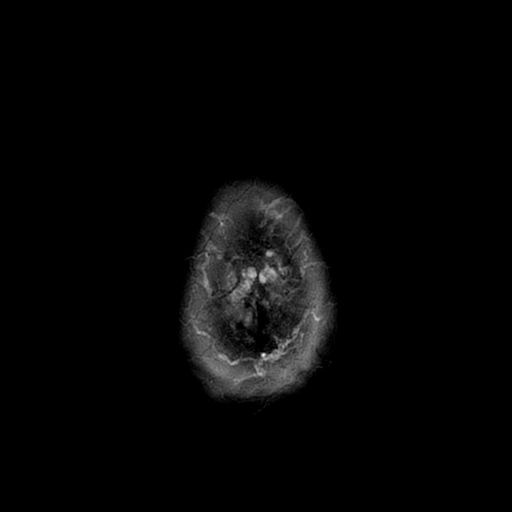

[Series 9: T2 · coronal · 3.5mm · 0.35mm/px · 2 of 24 slices shown (2 of 3)]
[im 1/24]
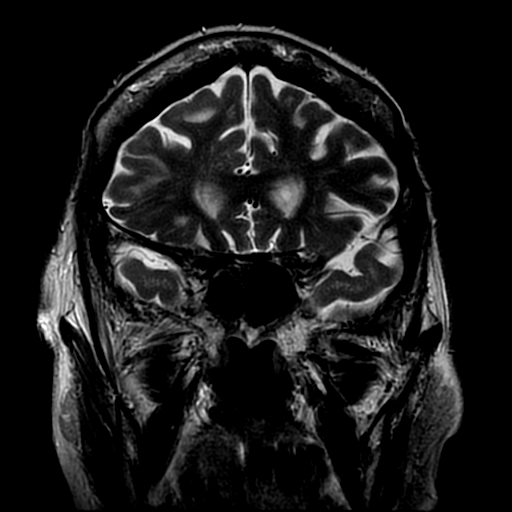
[im 24/24]
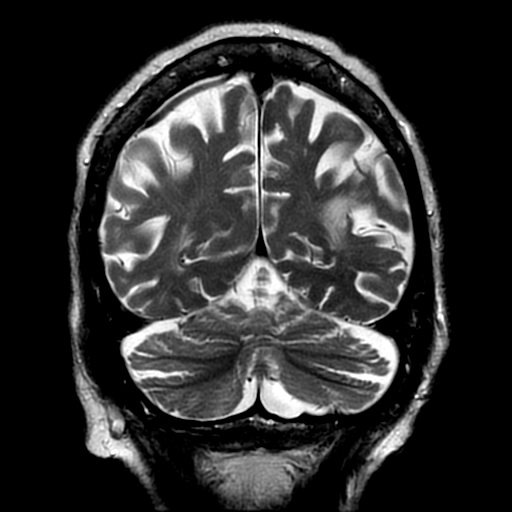

[Series 10: (person_name) · axial · 3.0mm · 0.47mm/px · 1 of 100 slices shown]
[im 1/100]
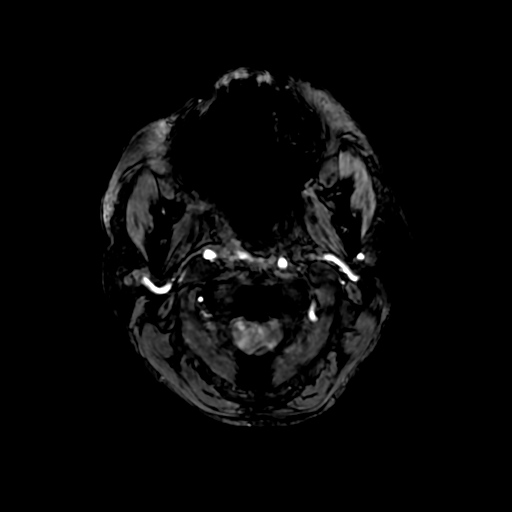

[Series 12: T2 · coronal · 5.0mm · 0.47mm/px · 3 of 28 slices shown (3 of 3)]
[im 1/28]
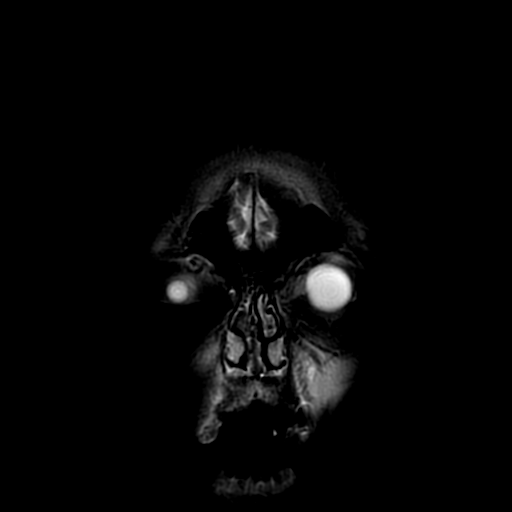
[im 14/28]
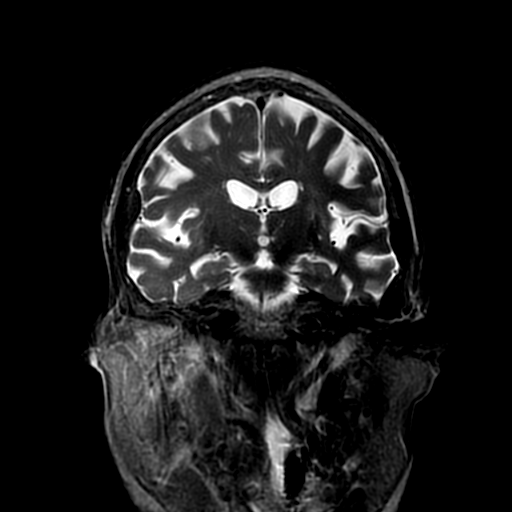
[im 28/28]
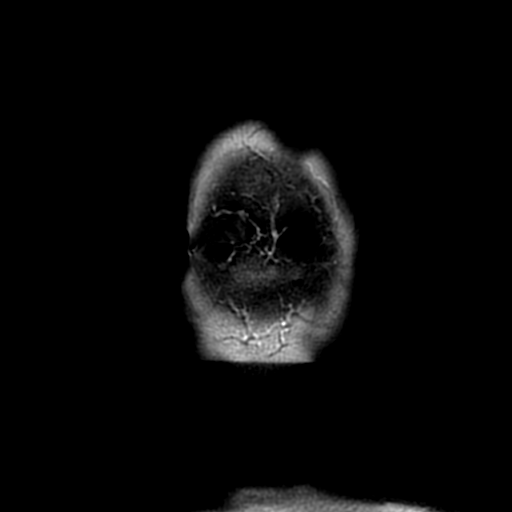

[Series 350: ADC · axial · 3.0mm · 0.94mm/px · z∈[-80,+66]mm · 5 of 50 slices shown (1 of 2)]
[im 1/50]
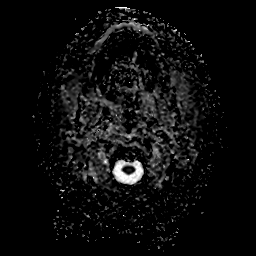
[im 13/50]
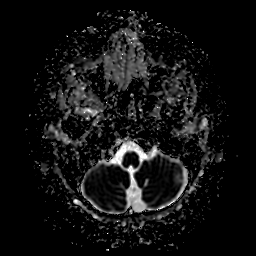
[im 25/50]
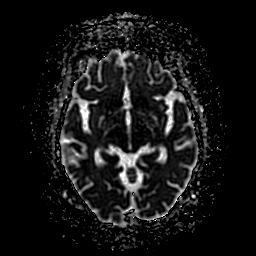
[im 37/50]
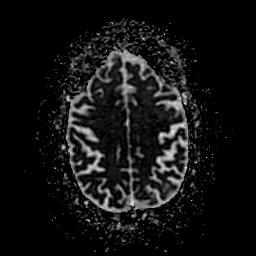
[im 50/50]
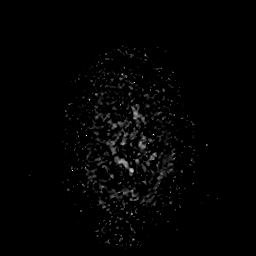

[Series 450: ADC · coronal · 4.0mm · 0.94mm/px · 3 of 33 slices shown (2 of 2)]
[im 1/33]
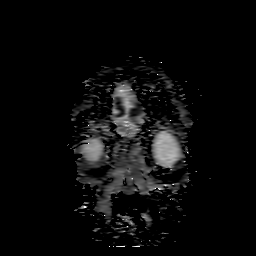
[im 17/33]
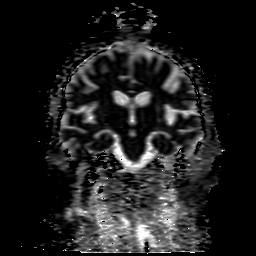
[im 33/33]
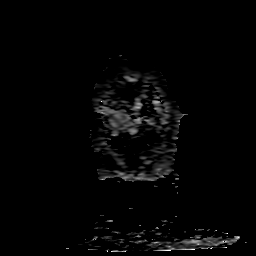

[34 of 48 positions shown; findings below may reference images not displayed]

FINDINGS: Brain: There is no evidence of acute infarct, intracranial mass
effect, or extra-axial fluid collection. A chronic left
parieto-occipital infarct is noted. There is also a small chronic
left temporoparietal infarct along the posterior margin of the
sylvian fissure. Small chronic infarcts are present in both
cerebellar hemispheres, with acute cerebellar infarcts present on
the prior MRI. Scattered small foci of T2 hyperintensity in the
cerebral white matter bilaterally are similar to the prior MRI and
nonspecific but compatible with mild chronic small vessel ischemic
disease.

A 3 mm focus of signal abnormality in the posterior subcortical left
frontal lobe demonstrates central T2 hyperintensity, a hemosiderin
ring, and prominent susceptibility artifact most compatible with a
cavernoma. There is also likely an adjacent small developmental
venous anomaly.

Moderate cerebral atrophy is noted. Dedicated temporal lobe imaging
demonstrates symmetric volume of the hippocampi without focal signal
abnormality. The pituitary gland is minimally prominent in size for
age, measuring 8 mm in height with a minimally convex superior
margin.

Vascular: Major intracranial vascular flow voids are preserved.

Skull and upper cervical spine: Unremarkable bone marrow signal.

Sinuses/Orbits: Bilateral cataract extraction. Trace right mastoid
fluid. At most minimal bilateral ethmoid air cell mucosal
thickening.

Other: None.
IMPRESSION: 1. No acute intracranial abnormality.
2. Chronic ischemic changes including chronic left parieto-occipital
and cerebellar infarcts.

## 2018-12-05 IMAGING — CT CT ABD-PELV W/O CM
2 of 4 series · 15 of 46 positions shown, 17 images · non-contrast
Comparison: CT scan 09/27/2017

CLINICAL DATA: Abdominal distension, nausea and vomiting. History
of metastatic colon cancer.

EXAM:
CT CHEST, ABDOMEN AND PELVIS WITHOUT CONTRAST
TECHNIQUE: Multidetector CT imaging of the chest, abdomen and pelvis was
performed following the standard protocol without IV contrast.

[Series 3: cap without · axial · non-contrast · 0.84mm/px · z∈[+617,+1132]mm · 12 of 121 slices shown, 14 images]
[im 9/121  soft-tissue]
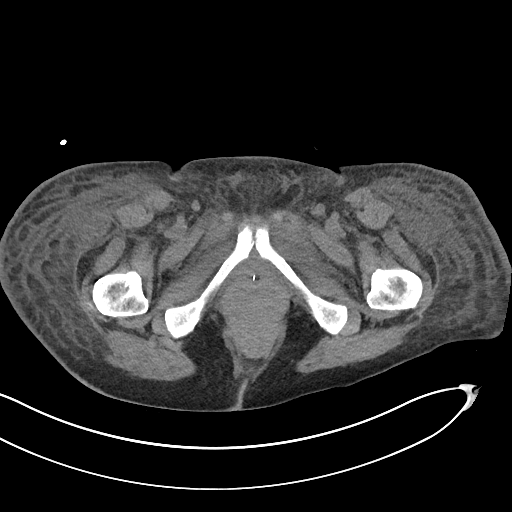
[im 9/121  bone]
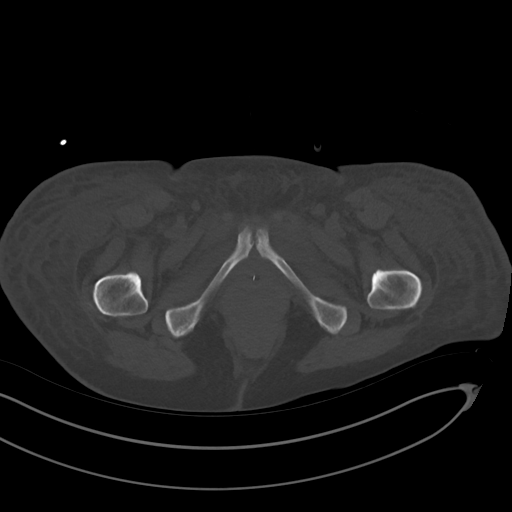
[im 18/121  soft-tissue]
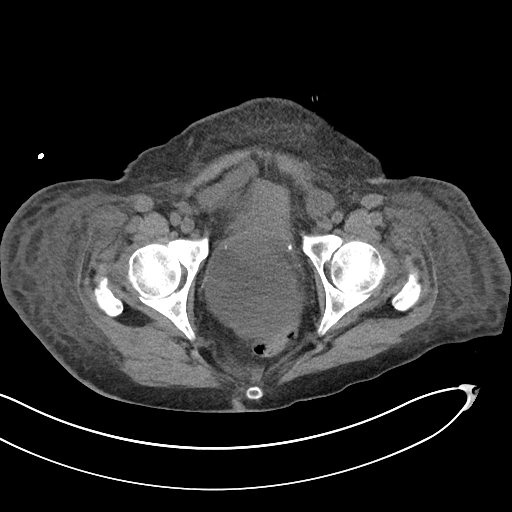
[im 26/121  soft-tissue]
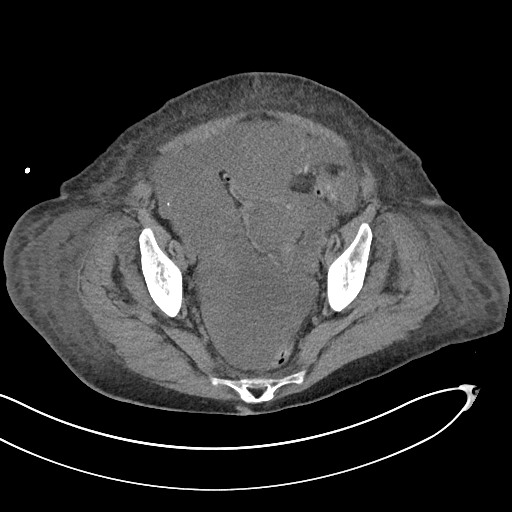
[im 35/121  soft-tissue]
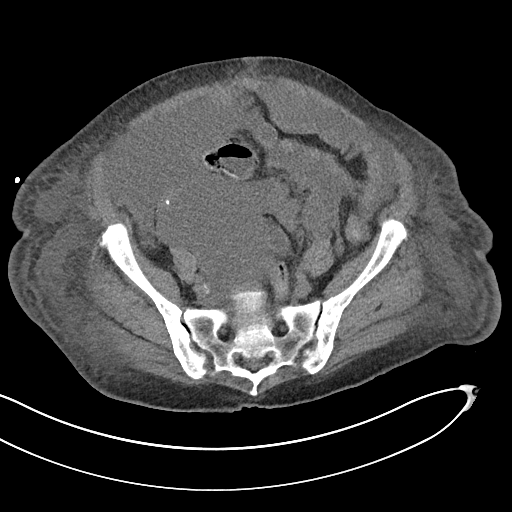
[im 43/121  soft-tissue]
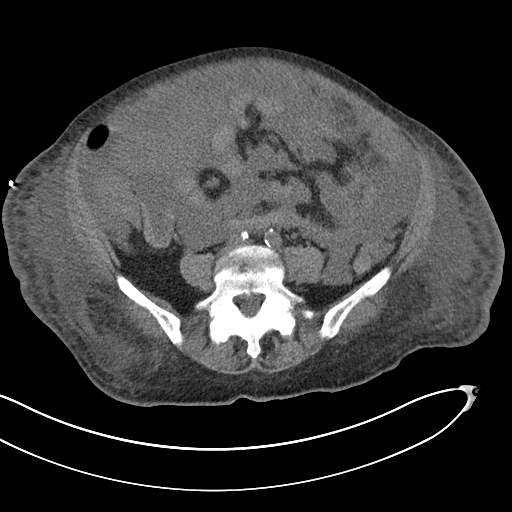
[im 52/121  soft-tissue]
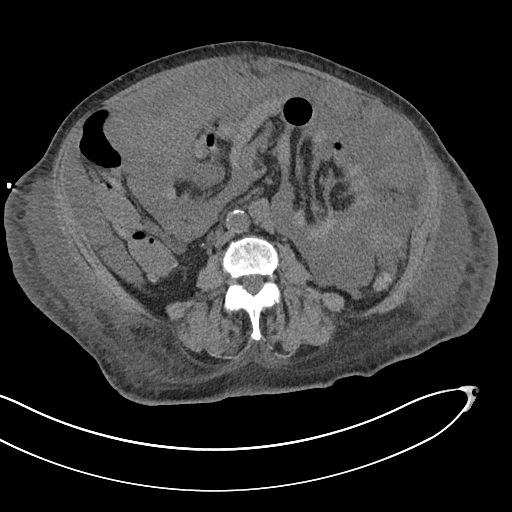
[im 69/121  soft-tissue]
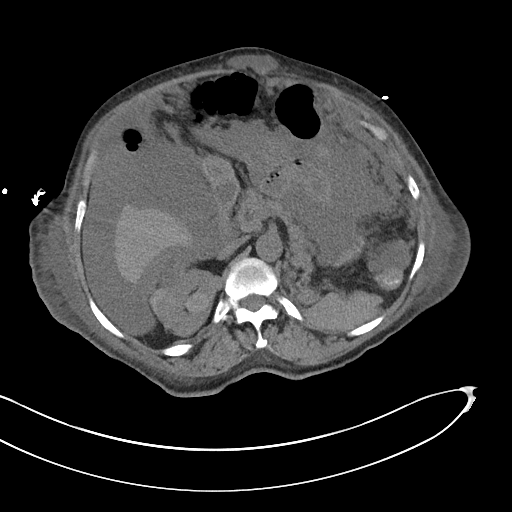
[im 78/121  soft-tissue]
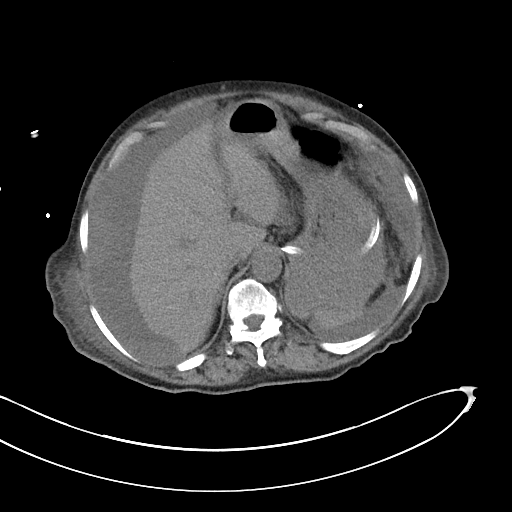
[im 86/121  soft-tissue]
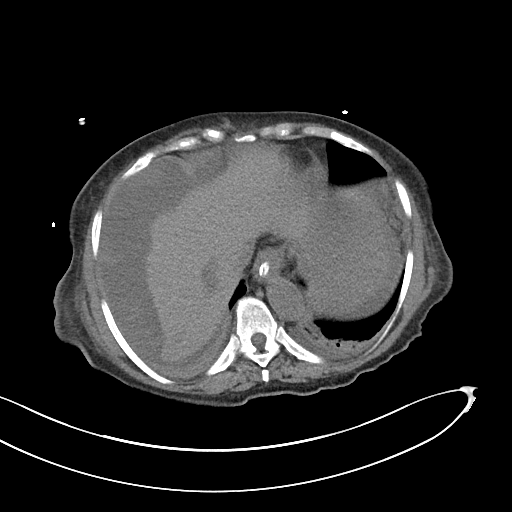
[im 86/121  bone]
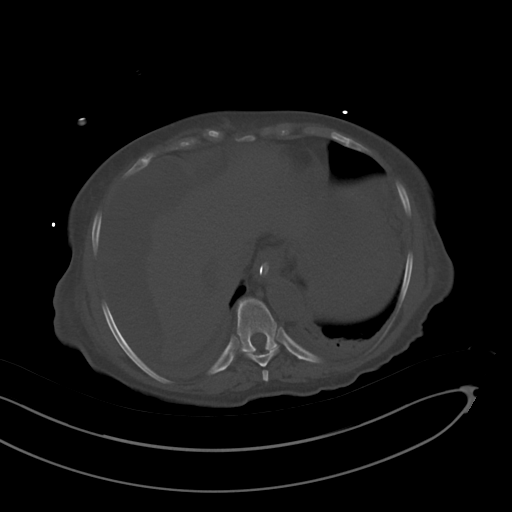
[im 95/121  soft-tissue]
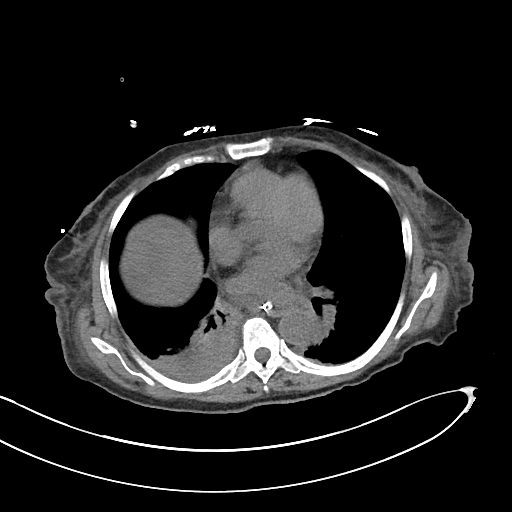
[im 103/121  soft-tissue]
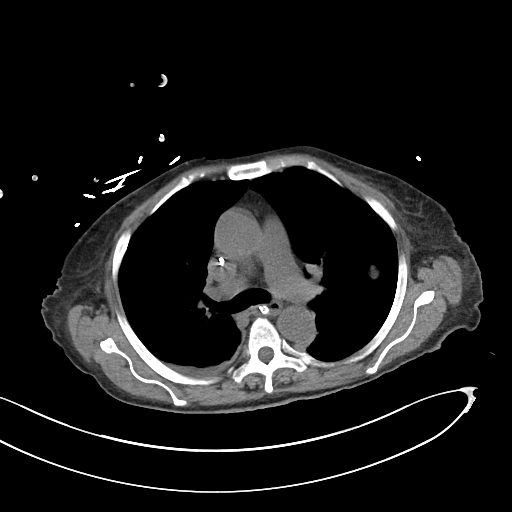
[im 112/121  soft-tissue]
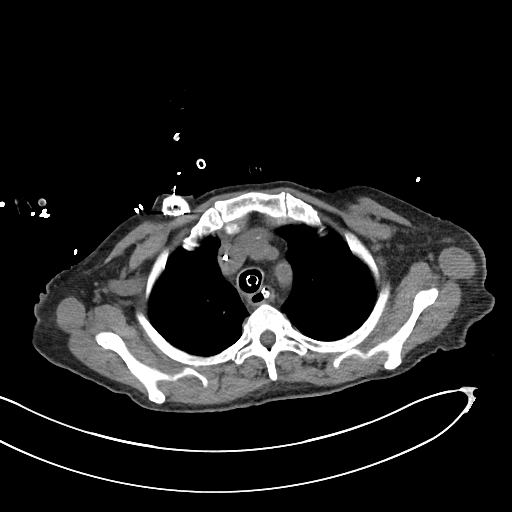

[Series 6: cor · coronal · 0.86mm/px · 3 of 101 slices shown]
[im 34/101  soft-tissue]
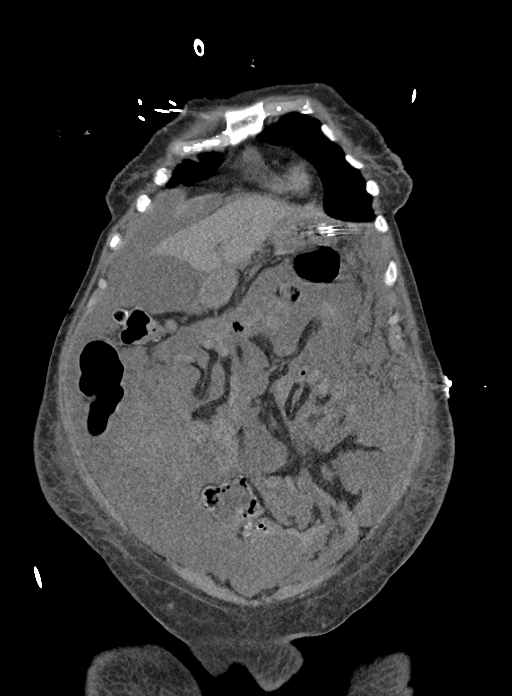
[im 45/101  soft-tissue]
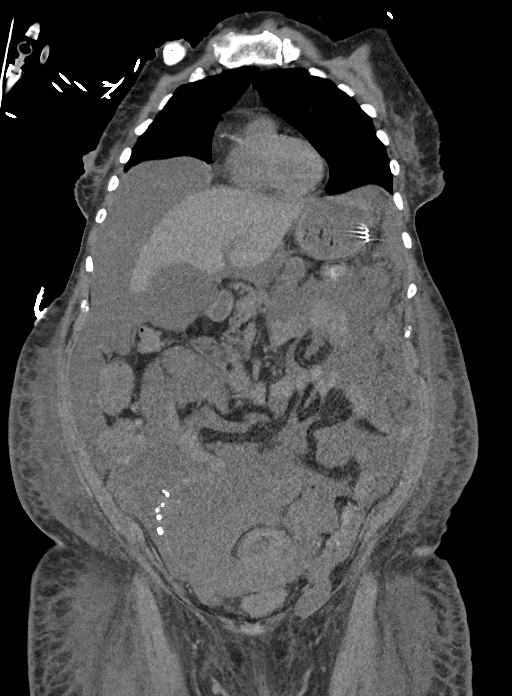
[im 56/101  soft-tissue]
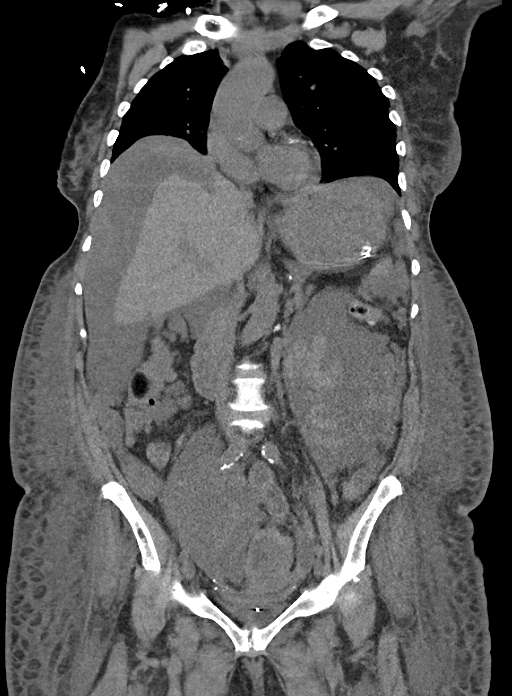

[15 of 46 positions shown; findings below may reference images not displayed]

FINDINGS: CT CHEST FINDINGS

Cardiovascular: The heart is normal in size. No pericardial
effusion. The aorta is normal in caliber. No dissection. Stable
coronary artery calcifications.

Mediastinum/Nodes: No mediastinal or hilar mass or adenopathy. Small
scattered lymph nodes are stable. The endotracheal tube is in good
position above the carina and the NG tube is coursing down the
esophagus and into the stomach.

Lungs/Pleura: Stable pulmonary metastatic disease when compared to
the recent prior CT scan. Index lesion in the left upper lobe
measures 17 x 16 mm and previously measured 17 x 17 mm. Index lesion
in the right upper lobe near the hilum measures 23 x 20 mm and
previous measured 21 x 20 mm. No new pulmonary lesions. There are
small bilateral pleural effusions with overlying atelectasis, right
greater than left.

Musculoskeletal: No breast masses, supraclavicular or axillary
adenopathy. The bony structures are unremarkable and stable.

CT ABDOMEN PELVIS FINDINGS

Hepatobiliary: Stable peritoneal surface lesions involving the
liver. No new lesions. Large volume ascites with mass effect on the
liver. No intrahepatic biliary dilatation. The gallbladder is
distended. No common bile duct dilatation.

Pancreas: No mass, inflammation or ductal dilatation. Moderate
pancreatic atrophy.

Spleen: Small spleen.  No focal lesions.

Adrenals/Urinary Tract: Status post left nephrectomy. Stable left
adrenal gland adenoma. The right adrenal gland is unremarkable. The
right kidney demonstrates multiple hyperdense/hemorrhagic cysts.

Stomach/Bowel: The stomach, duodenum, small bowel and colon are
grossly normal. No obstructive findings.

Vascular/Lymphatic: Extensive and relatively stable appearing
omental and peritoneal surface disease in the abdomen and pelvis. No
significant change since the study 1 month ago.

Stable atherosclerotic calcifications involving the aorta iliac
arteries.

Reproductive: The uterus and ovaries are not demonstrated. Suspect
prior hysterectomy.

Other: Stable pelvic tumor and large volume ascites.

Diffuse and marked body wall edema.

Musculoskeletal: No significant bony findings.
IMPRESSION: 1. Large volume abdominal/pelvic ascites and extensive omental and
peritoneal disease throughout the abdomen and pelvis not
significantly changed since recent prior study.
2. Stable pulmonary metastatic lesions.
3. Small bilateral pleural effusions and bibasilar atelectasis,
right greater than left.

## 2021-02-07 ENCOUNTER — Encounter: Payer: Self-pay | Admitting: Oncology
